# Patient Record
Sex: Male | Born: 1935
Health system: Southern US, Community
[De-identification: ages and names within clinical notes are randomized; demographics above are authoritative.]

## PROBLEM LIST (undated history)

## (undated) DIAGNOSIS — G8929 Other chronic pain: Secondary | ICD-10-CM

## (undated) DIAGNOSIS — I4510 Unspecified right bundle-branch block: Secondary | ICD-10-CM

## (undated) DIAGNOSIS — I5020 Unspecified systolic (congestive) heart failure: Secondary | ICD-10-CM

## (undated) DIAGNOSIS — E119 Type 2 diabetes mellitus without complications: Secondary | ICD-10-CM

## (undated) DIAGNOSIS — I350 Nonrheumatic aortic (valve) stenosis: Secondary | ICD-10-CM

## (undated) DIAGNOSIS — M1990 Unspecified osteoarthritis, unspecified site: Secondary | ICD-10-CM

## (undated) DIAGNOSIS — N1832 Chronic kidney disease, stage 3b: Secondary | ICD-10-CM

## (undated) DIAGNOSIS — E785 Hyperlipidemia, unspecified: Secondary | ICD-10-CM

## (undated) DIAGNOSIS — M5440 Lumbago with sciatica, unspecified side: Secondary | ICD-10-CM

## (undated) DIAGNOSIS — Z951 Presence of aortocoronary bypass graft: Secondary | ICD-10-CM

## (undated) DIAGNOSIS — Z973 Presence of spectacles and contact lenses: Secondary | ICD-10-CM

## (undated) DIAGNOSIS — N529 Male erectile dysfunction, unspecified: Secondary | ICD-10-CM

## (undated) DIAGNOSIS — Z87442 Personal history of urinary calculi: Secondary | ICD-10-CM

## (undated) DIAGNOSIS — N401 Enlarged prostate with lower urinary tract symptoms: Secondary | ICD-10-CM

## (undated) DIAGNOSIS — I2510 Atherosclerotic heart disease of native coronary artery without angina pectoris: Secondary | ICD-10-CM

## (undated) DIAGNOSIS — K439 Ventral hernia without obstruction or gangrene: Secondary | ICD-10-CM

## (undated) DIAGNOSIS — K5730 Diverticulosis of large intestine without perforation or abscess without bleeding: Secondary | ICD-10-CM

## (undated) DIAGNOSIS — I739 Peripheral vascular disease, unspecified: Secondary | ICD-10-CM

## (undated) DIAGNOSIS — K08109 Complete loss of teeth, unspecified cause, unspecified class: Secondary | ICD-10-CM

## (undated) DIAGNOSIS — E291 Testicular hypofunction: Secondary | ICD-10-CM

## (undated) DIAGNOSIS — I5022 Chronic systolic (congestive) heart failure: Secondary | ICD-10-CM

## (undated) DIAGNOSIS — I10 Essential (primary) hypertension: Secondary | ICD-10-CM

## (undated) DIAGNOSIS — R011 Cardiac murmur, unspecified: Secondary | ICD-10-CM

## (undated) DIAGNOSIS — Z972 Presence of dental prosthetic device (complete) (partial): Secondary | ICD-10-CM

## (undated) DIAGNOSIS — Z9289 Personal history of other medical treatment: Secondary | ICD-10-CM

## (undated) DIAGNOSIS — C679 Malignant neoplasm of bladder, unspecified: Secondary | ICD-10-CM

## (undated) DIAGNOSIS — N183 Chronic kidney disease, stage 3 unspecified: Secondary | ICD-10-CM

## (undated) HISTORY — PX: CORONARY ARTERY BYPASS GRAFT: SHX141

## (undated) HISTORY — DX: Unspecified osteoarthritis, unspecified site: M19.90

## (undated) HISTORY — DX: Atherosclerotic heart disease of native coronary artery without angina pectoris: I25.10

## (undated) HISTORY — DX: Ventral hernia without obstruction or gangrene: K43.9

## (undated) HISTORY — DX: Essential (primary) hypertension: I10

## (undated) HISTORY — DX: Type 2 diabetes mellitus without complications: E11.9

## (undated) HISTORY — PX: TONSILLECTOMY: SUR1361

## (undated) HISTORY — DX: Male erectile dysfunction, unspecified: N52.9

---

## 1998-05-09 ENCOUNTER — Encounter: Payer: Self-pay | Admitting: Endocrinology

## 1998-05-09 ENCOUNTER — Ambulatory Visit (HOSPITAL_COMMUNITY): Admission: RE | Admit: 1998-05-09 | Discharge: 1998-05-09 | Payer: Self-pay | Admitting: Endocrinology

## 2001-09-12 ENCOUNTER — Emergency Department (HOSPITAL_COMMUNITY): Admission: EM | Admit: 2001-09-12 | Discharge: 2001-09-12 | Payer: Self-pay | Admitting: Emergency Medicine

## 2003-12-02 ENCOUNTER — Ambulatory Visit: Payer: Self-pay | Admitting: Internal Medicine

## 2004-02-12 ENCOUNTER — Ambulatory Visit: Payer: Self-pay | Admitting: Internal Medicine

## 2004-03-31 ENCOUNTER — Ambulatory Visit: Payer: Self-pay | Admitting: Internal Medicine

## 2004-04-13 ENCOUNTER — Ambulatory Visit: Payer: Self-pay

## 2004-07-10 ENCOUNTER — Ambulatory Visit: Payer: Self-pay | Admitting: Internal Medicine

## 2004-10-12 ENCOUNTER — Ambulatory Visit: Payer: Self-pay | Admitting: Internal Medicine

## 2004-10-16 ENCOUNTER — Ambulatory Visit: Payer: Self-pay | Admitting: Internal Medicine

## 2005-04-02 ENCOUNTER — Ambulatory Visit: Payer: Self-pay | Admitting: Internal Medicine

## 2005-05-06 ENCOUNTER — Ambulatory Visit: Payer: Self-pay | Admitting: Internal Medicine

## 2005-05-07 ENCOUNTER — Ambulatory Visit: Payer: Self-pay | Admitting: Internal Medicine

## 2005-07-02 ENCOUNTER — Ambulatory Visit: Payer: Self-pay | Admitting: Internal Medicine

## 2005-10-28 ENCOUNTER — Ambulatory Visit: Payer: Self-pay | Admitting: Internal Medicine

## 2005-10-28 LAB — CONVERTED CEMR LAB
ALT: 20 units/L (ref 0–40)
AST: 22 units/L (ref 0–37)
Albumin: 4.1 g/dL (ref 3.5–5.2)
Alkaline Phosphatase: 71 units/L (ref 39–117)
BUN: 17 mg/dL (ref 6–23)
CO2: 26 meq/L (ref 19–32)
Calcium: 9 mg/dL (ref 8.4–10.5)
Chloride: 108 meq/L (ref 96–112)
Creatinine, Ser: 1.1 mg/dL (ref 0.4–1.5)
GFR calc non Af Amer: 71 mL/min
Glomerular Filtration Rate, Af Am: 86 mL/min/{1.73_m2}
Glucose, Bld: 139 mg/dL — ABNORMAL HIGH (ref 70–99)
HCT: 44.8 % (ref 39.0–52.0)
Hemoglobin: 15.1 g/dL (ref 13.0–17.0)
Hgb A1c MFr Bld: 5.9 % (ref 4.6–6.0)
MCHC: 33.8 g/dL (ref 30.0–36.0)
MCV: 87.2 fL (ref 78.0–100.0)
Platelets: 218 10*3/uL (ref 150–400)
Potassium: 4.2 meq/L (ref 3.5–5.1)
RBC: 5.14 M/uL (ref 4.22–5.81)
RDW: 12.2 % (ref 11.5–14.6)
Sodium: 140 meq/L (ref 135–145)
TSH: 1.06 microintl units/mL (ref 0.35–5.50)
Total Bilirubin: 1 mg/dL (ref 0.3–1.2)
Total Protein: 6.6 g/dL (ref 6.0–8.3)
WBC: 7 10*3/uL (ref 4.5–10.5)

## 2005-11-05 ENCOUNTER — Ambulatory Visit: Payer: Self-pay | Admitting: Internal Medicine

## 2006-03-03 ENCOUNTER — Ambulatory Visit: Payer: Self-pay | Admitting: Internal Medicine

## 2006-03-03 LAB — CONVERTED CEMR LAB
ALT: 25 units/L (ref 0–40)
AST: 23 units/L (ref 0–37)
BUN: 17 mg/dL (ref 6–23)
Cholesterol: 150 mg/dL (ref 0–200)
Creatinine, Ser: 1 mg/dL (ref 0.4–1.5)
HDL: 32.4 mg/dL — ABNORMAL LOW (ref >39.0)
Hgb A1c MFr Bld: 6.4 % — ABNORMAL HIGH (ref 4.6–6.0)
LDL Cholesterol: 89 mg/dL (ref 0–99)
Potassium: 4.3 meq/L (ref 3.5–5.1)
Sodium: 141 meq/L (ref 135–145)
Total CHOL/HDL Ratio: 4.6
Triglycerides: 145 mg/dL (ref 0–149)
VLDL: 29 mg/dL (ref 0–40)

## 2006-04-29 ENCOUNTER — Ambulatory Visit: Payer: Self-pay | Admitting: Internal Medicine

## 2006-07-26 DIAGNOSIS — E785 Hyperlipidemia, unspecified: Secondary | ICD-10-CM | POA: Insufficient documentation

## 2006-07-26 DIAGNOSIS — I10 Essential (primary) hypertension: Secondary | ICD-10-CM | POA: Insufficient documentation

## 2006-07-26 DIAGNOSIS — I2510 Atherosclerotic heart disease of native coronary artery without angina pectoris: Secondary | ICD-10-CM | POA: Insufficient documentation

## 2006-07-26 DIAGNOSIS — M1990 Unspecified osteoarthritis, unspecified site: Secondary | ICD-10-CM | POA: Insufficient documentation

## 2006-09-30 DIAGNOSIS — N259 Disorder resulting from impaired renal tubular function, unspecified: Secondary | ICD-10-CM | POA: Insufficient documentation

## 2006-09-30 DIAGNOSIS — M545 Low back pain, unspecified: Secondary | ICD-10-CM | POA: Insufficient documentation

## 2006-11-07 ENCOUNTER — Encounter: Payer: Self-pay | Admitting: Internal Medicine

## 2006-12-07 ENCOUNTER — Ambulatory Visit: Payer: Self-pay | Admitting: Internal Medicine

## 2006-12-07 LAB — CONVERTED CEMR LAB: Vit D, 1,25-Dihydroxy: 40 (ref 30–89)

## 2006-12-15 LAB — CONVERTED CEMR LAB
ALT: 29 units/L (ref 0–53)
AST: 23 units/L (ref 0–37)
Albumin: 4.1 g/dL (ref 3.5–5.2)
Alkaline Phosphatase: 81 units/L (ref 39–117)
BUN: 17 mg/dL (ref 6–23)
Basophils Absolute: 0 10*3/uL (ref 0.0–0.1)
Basophils Relative: 0.5 % (ref 0.0–1.0)
Bilirubin, Direct: 0.1 mg/dL (ref 0.0–0.3)
CO2: 26 meq/L (ref 19–32)
Calcium: 8.8 mg/dL (ref 8.4–10.5)
Chloride: 105 meq/L (ref 96–112)
Cholesterol: 177 mg/dL (ref 0–200)
Creatinine, Ser: 1 mg/dL (ref 0.4–1.5)
Eosinophils Absolute: 0.2 10*3/uL (ref 0.0–0.6)
Eosinophils Relative: 3.6 % (ref 0.0–5.0)
GFR calc Af Amer: 95 mL/min
GFR calc non Af Amer: 79 mL/min
Glucose, Bld: 155 mg/dL — ABNORMAL HIGH (ref 70–99)
HCT: 44.8 % (ref 39.0–52.0)
HDL: 31.5 mg/dL — ABNORMAL LOW (ref >39.0)
Hemoglobin: 15.8 g/dL (ref 13.0–17.0)
LDL Cholesterol: 112 mg/dL — ABNORMAL HIGH (ref 0–99)
Lymphocytes Relative: 24.1 % (ref 12.0–46.0)
MCHC: 35.3 g/dL (ref 30.0–36.0)
MCV: 85.8 fL (ref 78.0–100.0)
Monocytes Absolute: 0.9 10*3/uL — ABNORMAL HIGH (ref 0.2–0.7)
Monocytes Relative: 13.7 % — ABNORMAL HIGH (ref 3.0–11.0)
Neutro Abs: 4.1 10*3/uL (ref 1.4–7.7)
Neutrophils Relative %: 58.1 % (ref 43.0–77.0)
PSA: 0.86 ng/mL (ref 0.10–4.00)
Platelets: 190 10*3/uL (ref 150–400)
Potassium: 4.3 meq/L (ref 3.5–5.1)
RBC: 5.22 M/uL (ref 4.22–5.81)
RDW: 11.9 % (ref 11.5–14.6)
Sodium: 138 meq/L (ref 135–145)
TSH: 1.98 microintl units/mL (ref 0.35–5.50)
Total Bilirubin: 1 mg/dL (ref 0.3–1.2)
Total CHOL/HDL Ratio: 5.6
Total Protein: 6.7 g/dL (ref 6.0–8.3)
Triglycerides: 167 mg/dL — ABNORMAL HIGH (ref 0–149)
VLDL: 33 mg/dL (ref 0–40)
WBC: 6.9 10*3/uL (ref 4.5–10.5)

## 2007-03-03 ENCOUNTER — Ambulatory Visit: Payer: Self-pay | Admitting: Internal Medicine

## 2007-03-04 LAB — CONVERTED CEMR LAB
ALT: 21 units/L (ref 0–53)
AST: 18 units/L (ref 0–37)
Albumin: 4.2 g/dL (ref 3.5–5.2)
Alkaline Phosphatase: 81 units/L (ref 39–117)
BUN: 16 mg/dL (ref 6–23)
Bilirubin, Direct: 0.2 mg/dL (ref 0.0–0.3)
CO2: 31 meq/L (ref 19–32)
Calcium: 9.5 mg/dL (ref 8.4–10.5)
Chloride: 106 meq/L (ref 96–112)
Cholesterol: 161 mg/dL (ref 0–200)
Creatinine, Ser: 1 mg/dL (ref 0.4–1.5)
GFR calc Af Amer: 95 mL/min
GFR calc non Af Amer: 79 mL/min
Glucose, Bld: 153 mg/dL — ABNORMAL HIGH (ref 70–99)
HDL: 28.9 mg/dL — ABNORMAL LOW (ref >39.0)
Hgb A1c MFr Bld: 6.5 % — ABNORMAL HIGH (ref 4.6–6.0)
LDL Cholesterol: 95 mg/dL (ref 0–99)
Potassium: 5.1 meq/L (ref 3.5–5.1)
Sodium: 141 meq/L (ref 135–145)
Total Bilirubin: 1 mg/dL (ref 0.3–1.2)
Total CHOL/HDL Ratio: 5.6
Total Protein: 6.6 g/dL (ref 6.0–8.3)
Triglycerides: 184 mg/dL — ABNORMAL HIGH (ref 0–149)
VLDL: 37 mg/dL (ref 0–40)

## 2007-03-17 ENCOUNTER — Ambulatory Visit: Payer: Self-pay | Admitting: Internal Medicine

## 2007-07-17 ENCOUNTER — Ambulatory Visit: Payer: Self-pay | Admitting: Internal Medicine

## 2007-07-17 LAB — CONVERTED CEMR LAB
ALT: 19 units/L (ref 0–53)
AST: 22 units/L (ref 0–37)
Albumin: 3.9 g/dL (ref 3.5–5.2)
Alkaline Phosphatase: 77 units/L (ref 39–117)
BUN: 17 mg/dL (ref 6–23)
Basophils Absolute: 0 10*3/uL (ref 0.0–0.1)
Basophils Relative: 0.7 % (ref 0.0–1.0)
Bilirubin, Direct: 0.1 mg/dL (ref 0.0–0.3)
CO2: 26 meq/L (ref 19–32)
Calcium: 9.1 mg/dL (ref 8.4–10.5)
Chloride: 107 meq/L (ref 96–112)
Creatinine, Ser: 1 mg/dL (ref 0.4–1.5)
Eosinophils Absolute: 0.2 10*3/uL (ref 0.0–0.7)
Eosinophils Relative: 2.7 % (ref 0.0–5.0)
GFR calc Af Amer: 95 mL/min
GFR calc non Af Amer: 79 mL/min
Glucose, Bld: 163 mg/dL — ABNORMAL HIGH (ref 70–99)
HCT: 37.7 % — ABNORMAL LOW (ref 39.0–52.0)
Hemoglobin: 13.6 g/dL (ref 13.0–17.0)
Hgb A1c MFr Bld: 6.8 % — ABNORMAL HIGH (ref 4.6–6.0)
Lymphocytes Relative: 19.7 % (ref 12.0–46.0)
MCHC: 36 g/dL (ref 30.0–36.0)
MCV: 87.3 fL (ref 78.0–100.0)
Monocytes Absolute: 0.6 10*3/uL (ref 0.1–1.0)
Monocytes Relative: 8.4 % (ref 3.0–12.0)
Neutro Abs: 4.7 10*3/uL (ref 1.4–7.7)
Neutrophils Relative %: 68.5 % (ref 43.0–77.0)
Platelets: 120 10*3/uL — ABNORMAL LOW (ref 150–400)
Potassium: 4.4 meq/L (ref 3.5–5.1)
RBC: 4.32 M/uL (ref 4.22–5.81)
RDW: 11.9 % (ref 11.5–14.6)
Sodium: 146 meq/L — ABNORMAL HIGH (ref 135–145)
Total Bilirubin: 0.8 mg/dL (ref 0.3–1.2)
Total Protein: 6.5 g/dL (ref 6.0–8.3)
WBC: 6.8 10*3/uL (ref 4.5–10.5)

## 2007-07-20 ENCOUNTER — Ambulatory Visit: Payer: Self-pay | Admitting: Internal Medicine

## 2007-12-26 ENCOUNTER — Ambulatory Visit: Payer: Self-pay | Admitting: Internal Medicine

## 2007-12-26 LAB — CONVERTED CEMR LAB
BUN: 17 mg/dL (ref 6–23)
Bacteria, UA: NEGATIVE
Bilirubin Urine: NEGATIVE
CO2: 28 meq/L (ref 19–32)
Calcium: 9.2 mg/dL (ref 8.4–10.5)
Chloride: 107 meq/L (ref 96–112)
Cholesterol: 134 mg/dL (ref 0–200)
Creatinine, Ser: 0.9 mg/dL (ref 0.4–1.5)
Crystals: NEGATIVE
GFR calc Af Amer: 107 mL/min
GFR calc non Af Amer: 89 mL/min
Glucose, Bld: 166 mg/dL — ABNORMAL HIGH (ref 70–99)
HDL: 28.6 mg/dL — ABNORMAL LOW (ref >39.0)
Hemoglobin, Urine: NEGATIVE
Hgb A1c MFr Bld: 6.7 % — ABNORMAL HIGH (ref 4.6–6.0)
Ketones, ur: NEGATIVE mg/dL
LDL Cholesterol: 86 mg/dL (ref 0–99)
Leukocytes, UA: NEGATIVE
Nitrite: NEGATIVE
PSA: 0.83 ng/mL (ref 0.10–4.00)
Potassium: 4.5 meq/L (ref 3.5–5.1)
RBC / HPF: NONE SEEN
Sodium: 141 meq/L (ref 135–145)
Specific Gravity, Urine: 1.025 (ref 1.000–1.03)
Squamous Epithelial / HPF: NEGATIVE /lpf
TSH: 1.71 microintl units/mL (ref 0.35–5.50)
Total CHOL/HDL Ratio: 4.7
Total Protein, Urine: 30 mg/dL — AB
Triglycerides: 97 mg/dL (ref 0–149)
Urine Glucose: NEGATIVE mg/dL
Urobilinogen, UA: 0.2 (ref 0.0–1.0)
VLDL: 19 mg/dL (ref 0–40)
pH: 5.5 (ref 5.0–8.0)

## 2007-12-28 ENCOUNTER — Ambulatory Visit: Payer: Self-pay | Admitting: Internal Medicine

## 2007-12-28 DIAGNOSIS — L570 Actinic keratosis: Secondary | ICD-10-CM | POA: Insufficient documentation

## 2007-12-28 DIAGNOSIS — L219 Seborrheic dermatitis, unspecified: Secondary | ICD-10-CM | POA: Insufficient documentation

## 2008-04-25 ENCOUNTER — Ambulatory Visit: Payer: Self-pay | Admitting: Internal Medicine

## 2008-04-25 DIAGNOSIS — T476X5A Adverse effect of antidiarrheal drugs, initial encounter: Secondary | ICD-10-CM | POA: Insufficient documentation

## 2008-04-25 DIAGNOSIS — T887XXA Unspecified adverse effect of drug or medicament, initial encounter: Secondary | ICD-10-CM

## 2008-04-25 LAB — CONVERTED CEMR LAB
ALT: 21 units/L (ref 0–53)
AST: 19 units/L (ref 0–37)
Albumin: 4.3 g/dL (ref 3.5–5.2)
Alkaline Phosphatase: 77 units/L (ref 39–117)
BUN: 17 mg/dL (ref 6–23)
Bilirubin, Direct: 0.1 mg/dL (ref 0.0–0.3)
CO2: 32 meq/L (ref 19–32)
Calcium: 9.3 mg/dL (ref 8.4–10.5)
Chloride: 102 meq/L (ref 96–112)
Cholesterol: 124 mg/dL (ref 0–200)
Creatinine, Ser: 1.1 mg/dL (ref 0.4–1.5)
GFR calc non Af Amer: 70.11 mL/min (ref >60)
Glucose, Bld: 135 mg/dL — ABNORMAL HIGH (ref 70–99)
HDL: 33.6 mg/dL — ABNORMAL LOW (ref >39.00)
Hgb A1c MFr Bld: 6.7 % — ABNORMAL HIGH (ref 4.6–6.5)
LDL Cholesterol: 68 mg/dL (ref 0–99)
Potassium: 5 meq/L (ref 3.5–5.1)
Sodium: 142 meq/L (ref 135–145)
Total Bilirubin: 1.1 mg/dL (ref 0.3–1.2)
Total CHOL/HDL Ratio: 4
Total Protein: 6.7 g/dL (ref 6.0–8.3)
Triglycerides: 111 mg/dL (ref 0.0–149.0)
VLDL: 22.2 mg/dL (ref 0.0–40.0)

## 2008-04-29 ENCOUNTER — Ambulatory Visit: Payer: Self-pay | Admitting: Internal Medicine

## 2008-08-29 ENCOUNTER — Ambulatory Visit: Payer: Self-pay | Admitting: Internal Medicine

## 2008-08-29 LAB — CONVERTED CEMR LAB
BUN: 17 mg/dL (ref 6–23)
CO2: 31 meq/L (ref 19–32)
Calcium: 9.3 mg/dL (ref 8.4–10.5)
Chloride: 102 meq/L (ref 96–112)
Creatinine, Ser: 1.2 mg/dL (ref 0.4–1.5)
GFR calc non Af Amer: 63.35 mL/min (ref >60)
Glucose, Bld: 136 mg/dL — ABNORMAL HIGH (ref 70–99)
Hgb A1c MFr Bld: 6.2 % (ref 4.6–6.5)
Potassium: 4.4 meq/L (ref 3.5–5.1)
Sodium: 139 meq/L (ref 135–145)

## 2008-09-04 ENCOUNTER — Ambulatory Visit: Payer: Self-pay | Admitting: Internal Medicine

## 2008-10-14 ENCOUNTER — Telehealth: Payer: Self-pay | Admitting: Internal Medicine

## 2008-12-25 ENCOUNTER — Ambulatory Visit: Payer: Self-pay | Admitting: Internal Medicine

## 2008-12-27 LAB — CONVERTED CEMR LAB
ALT: 25 units/L (ref 0–53)
AST: 27 units/L (ref 0–37)
Albumin: 4 g/dL (ref 3.5–5.2)
Alkaline Phosphatase: 71 units/L (ref 39–117)
BUN: 18 mg/dL (ref 6–23)
Bilirubin, Direct: 0.2 mg/dL (ref 0.0–0.3)
CO2: 30 meq/L (ref 19–32)
Calcium: 9.4 mg/dL (ref 8.4–10.5)
Chloride: 102 meq/L (ref 96–112)
Creatinine, Ser: 1.3 mg/dL (ref 0.4–1.5)
GFR calc non Af Amer: 57.71 mL/min (ref >60)
Glucose, Bld: 153 mg/dL — ABNORMAL HIGH (ref 70–99)
Hgb A1c MFr Bld: 6.7 % — ABNORMAL HIGH (ref 4.6–6.5)
Potassium: 4.7 meq/L (ref 3.5–5.1)
Sodium: 140 meq/L (ref 135–145)
Total Bilirubin: 1.2 mg/dL (ref 0.3–1.2)
Total Protein: 6.9 g/dL (ref 6.0–8.3)

## 2009-01-16 ENCOUNTER — Ambulatory Visit: Payer: Self-pay | Admitting: Internal Medicine

## 2009-03-11 ENCOUNTER — Ambulatory Visit: Payer: Self-pay | Admitting: Internal Medicine

## 2009-06-11 ENCOUNTER — Ambulatory Visit: Payer: Self-pay | Admitting: Internal Medicine

## 2009-06-12 LAB — CONVERTED CEMR LAB
ALT: 21 units/L (ref 0–53)
AST: 22 units/L (ref 0–37)
Albumin: 4.2 g/dL (ref 3.5–5.2)
BUN: 21 mg/dL (ref 6–23)
Chloride: 103 meq/L (ref 96–112)
Glucose, Bld: 176 mg/dL — ABNORMAL HIGH (ref 70–99)
Hgb A1c MFr Bld: 7.1 % — ABNORMAL HIGH (ref 4.6–6.5)
Potassium: 4.6 meq/L (ref 3.5–5.1)

## 2009-06-13 ENCOUNTER — Ambulatory Visit: Payer: Self-pay | Admitting: Internal Medicine

## 2009-10-09 ENCOUNTER — Ambulatory Visit: Payer: Self-pay | Admitting: Internal Medicine

## 2009-10-09 LAB — CONVERTED CEMR LAB
ALT: 27 units/L (ref 0–53)
AST: 24 units/L (ref 0–37)
Albumin: 4 g/dL (ref 3.5–5.2)
Alkaline Phosphatase: 66 units/L (ref 39–117)
Basophils Absolute: 0.1 10*3/uL (ref 0.0–0.1)
Basophils Relative: 1 % (ref 0.0–3.0)
Bilirubin Urine: NEGATIVE
Eosinophils Relative: 2.2 % (ref 0.0–5.0)
GFR calc non Af Amer: 63.16 mL/min (ref >60)
Glucose, Bld: 138 mg/dL — ABNORMAL HIGH (ref 70–99)
HCT: 40.3 % (ref 39.0–52.0)
Hemoglobin, Urine: NEGATIVE
Hemoglobin: 14.2 g/dL (ref 13.0–17.0)
Ketones, ur: NEGATIVE mg/dL
Lymphs Abs: 1.7 10*3/uL (ref 0.7–4.0)
Monocytes Relative: 10.3 % (ref 3.0–12.0)
Neutro Abs: 5.2 10*3/uL (ref 1.4–7.7)
Potassium: 5 meq/L (ref 3.5–5.1)
RBC: 4.55 M/uL (ref 4.22–5.81)
RDW: 12.5 % (ref 11.5–14.6)
Sodium: 139 meq/L (ref 135–145)
TSH: 1.62 microintl units/mL (ref 0.35–5.50)
Total CHOL/HDL Ratio: 4
Total Protein, Urine: NEGATIVE mg/dL
Total Protein: 6.7 g/dL (ref 6.0–8.3)
Urine Glucose: NEGATIVE mg/dL
Urobilinogen, UA: 0.2 (ref 0.0–1.0)
VLDL: 35 mg/dL (ref 0.0–40.0)

## 2009-10-14 ENCOUNTER — Ambulatory Visit: Payer: Self-pay | Admitting: Internal Medicine

## 2009-10-14 ENCOUNTER — Encounter: Payer: Self-pay | Admitting: Internal Medicine

## 2009-10-14 DIAGNOSIS — D485 Neoplasm of uncertain behavior of skin: Secondary | ICD-10-CM

## 2010-01-20 ENCOUNTER — Ambulatory Visit: Admit: 2010-01-20 | Payer: Self-pay | Admitting: Internal Medicine

## 2010-02-10 NOTE — Assessment & Plan Note (Signed)
Summary: 1 MTH FU  STC / # / RS'D PER PT/NWS   Vital Signs:  Patient profile:   75 year old male Weight:      232 pounds BMI:     34.38 Temp:     97.8 degrees F oral Pulse rate:   91 / minute BP sitting:   160 / 80  (left arm)  Vitals Entered By: Tora Perches (March 11, 2009 7:58 AM) CC: f/u Is Patient Diabetic? Yes CBG Result 266   Primary Care Provider:  Tresa Garter MD  CC:  f/u.  History of Present Illness: The patient presents for a follow up of hypertension, CAD, DM, hyperlipidemia   Preventive Screening-Counseling & Management  Alcohol-Tobacco     Smoking Status: quit  Current Medications (verified): 1)  Mevacor 40 Mg  Tabs (Lovastatin) .... Once Daily 2)  Metformin Hcl 500 Mg  Tb24 (Metformin Hcl) .[name].. 1 By Mouth Bid 3)  Naproxen 500 Mg Tabs (Naproxen) .[name].. 1 By Mouth Two Times A Day Pc As Needed Pain 4)  Vitamin D3 1000 Unit  Tabs (Cholecalciferol) .[name].. 1 Qd 5)  Adult Aspirin Low Strength 81 Mg  Tbdp (Aspirin) .... Once Daily 6)  Lotrisone 1-0.05 % Crea (Clotrimazole-Betamethasone) .... Use Two Times A Day 7)  Triamcinolone Acetonide 0.1 % Oint (Triamcinolone Acetonide) .... Use 1-2 Times A Day 8)  Exforge Hct 10-320-25 Mg Tabs (Amlodipine-Valsartan-Hctz) .[name].. 1 By Mouth Once Daily For Blood Pressure  Allergies: 1)  ! Morphine Sulfate (Morphine Sulfate)  Past History:  Past Medical History: Last updated: 09/04/2008 Coronary artery disease Hyperlipidemia Hypertension Osteoarthritis Low back pain Renal insufficiency Diabetes mellitus, type II ED [place] Midline ventral hernia  Social History: Last updated: 12/07/2006 Married Retired, but working full time Former Smoker Regular exercise-yes  Family History: Reviewed history from 07/26/2006 and no changes required. Family History Hypertension  Social History: Reviewed history from 12/07/2006 and no changes required. Married Retired, but working full time Former Smoker Regular  exercise-yes  Review of Systems       The patient complains of weight gain.  The patient denies dyspnea on exertion and abdominal pain.    Physical Exam  General:  pleasant and cooperative, in no distress overweight-appearing.   Nose:  no erythema, no lesions Mouth:  no lesions Lungs:  Normal respiratory effort, chest expands symmetrically. Lungs are clear to auscultation, no crackles or wheezes. Heart:  Normal rate and regular rhythm. S1 and S2 normal without gallop, click, rub or other extra sounds. 2/6 systolic heart murmur  Abdomen:  Bowel sounds positive,abdomen soft and non-tender without masses, organomegaly. A large central ventral hernia noted.  Msk:  No deformity or scoliosis noted of thoracic or lumbar spine.   Extremities:  No clubbing, cyanosis, edema, or deformity noted with normal full range of motion of all joints.   Neurologic:  No cranial nerve deficits noted. Station and gait are normal. Plantar reflexes are down-going bilaterally. DTRs are symmetrical throughout. Sensory, motor and coordinative functions appear intact. Skin:  Intact without suspicious lesions or rashes Psych:  Cognition and judgment appear intact. Alert and cooperative with normal attention span and concentration. No apparent delusions, illusions, hallucinations   Impression & Recommendations:  Problem # 1:  DIABETES MELLITUS, TYPE II (ICD-250.00) Assessment Deteriorated Stop protein drinks The following medications were removed from the medication list:    Metformin Hcl 500 Mg Tb24 (Metformin hcl) .[name]... 1 by mouth bid His updated medication list for this problem includes:  Adult Aspirin Low Strength 81 Mg Tbdp (Aspirin) ..... Once daily    Exforge Hct 10-320-25 Mg Tabs (Amlodipine-valsartan-hctz) .[name]... 1 by mouth once daily for blood pressure    Metformin Hcl 1000 Mg Tabs (Metformin hcl) .[name]... 1 by mouth bid  Problem # 2:  LOW BACK PAIN (ICD-724.2) Assessment: Improved  His updated  medication list for this problem includes:    Naproxen 500 Mg Tabs (Naproxen) .[name]... 1 by mouth two times a day pc as needed pain    Adult Aspirin Low Strength 81 Mg Tbdp (Aspirin) ..... Once daily  Problem # 3:  HYPERTENSION (ICD-401.9) Assessment: Improved  His updated medication list for this problem includes:    Exforge Hct 10-320-25 Mg Tabs (Amlodipine-valsartan-hctz) .[name]... 1 by mouth once daily for blood pressure  Problem # 4:  RENAL INSUFFICIENCY (ICD-588.9) Assessment: Unchanged  Problem # 5:  CORONARY ARTERY DISEASE (ICD-414.00) Assessment: Unchanged  His updated medication list for this problem includes:    Adult Aspirin Low Strength 81 Mg Tbdp (Aspirin) ..... Once daily    Exforge Hct 10-320-25 Mg Tabs (Amlodipine-valsartan-hctz) .[name]... 1 by mouth once daily for blood pressure  Complete Medication List: 1)  Mevacor 40 Mg Tabs (Lovastatin) .... Once daily 2)  Naproxen 500 Mg Tabs (Naproxen) .[name].. 1 by mouth two times a day pc as needed pain 3)  Vitamin D3 1000 Unit Tabs (Cholecalciferol) .[name].. 1 qd 4)  Adult Aspirin Low Strength 81 Mg Tbdp (Aspirin) .... Once daily 5)  Lotrisone 1-0.05 % Crea (Clotrimazole-betamethasone) .... Use two times a day 6)  Triamcinolone Acetonide 0.1 % Oint (Triamcinolone acetonide) .... Use 1-2 times a day 7)  Exforge Hct 10-320-25 Mg Tabs (Amlodipine-valsartan-hctz) .[name].. 1 by mouth once daily for blood pressure 8)  Metformin Hcl 1000 Mg Tabs (Metformin hcl) .[name].. 1 by mouth bid  Patient Instructions: 1)  Use stretching exercises that I have provided (15 min. or longer every day) 2)  Loose weight! 3)  BMP prior to visit, ICD-9: 4)  Hepatic Panel prior to visit, ICD-9: 5)  HbgA1C prior to visit, ICD-9: 6)  Please schedule a follow-up appointment in 3 months. Prescriptions: METFORMIN HCL 1000 MG TABS (METFORMIN HCL) 1 by mouth bid  #60 x 12   Entered and Authorized by:   Tresa Garter MD   Signed by:   Tresa Garter MD on 03/11/2009    Method used:   Electronically to        [facility] [id number] (retail)       [address]       [place], [place]  [address]       Ph: [phone]       Fax: [phone]   RxID:   [id number]

## 2010-02-10 NOTE — Assessment & Plan Note (Signed)
Summary: 4 MO ROV /NWS $50    Vital Signs:  Patient profile:   75 year old male Weight:      227 pounds Temp:     97.5 degrees F oral Pulse rate:   68 / minute BP sitting:   180 / 76  (left arm)  Vitals Entered By: Tora Perches (January 16, 2009 10:06 AM) CC: f/u Is Patient Diabetic? Yes   Primary Care Provider:  Tresa Garter MD  CC:  f/u.  History of Present Illness: The patient presents for a follow up of hypertension, diabetes, hyperlipidemia   Preventive Screening-Counseling & Management  Alcohol-Tobacco     Smoking Status: quit  Current Medications (verified): 1)  Enalapril Maleate 20 Mg  Tabs (Enalapril Maleate) .[name].. 1 By Mouth Bid 2)  Mevacor 40 Mg  Tabs (Lovastatin) .... Once Daily 3)  Metformin Hcl 500 Mg  Tb24 (Metformin Hcl) .[name].. 1 By Mouth Bid 4)  Naproxen 500 Mg Tabs (Naproxen) .[name].. 1 By Mouth Two Times A Day Pc As Needed Pain 5)  Vitamin D3 1000 Unit  Tabs (Cholecalciferol) .[name].. 1 Qd 6)  Adult Aspirin Low Strength 81 Mg  Tbdp (Aspirin) .... Once Daily 7)  Verapamil Hcl Cr 360 Mg  Cp24 (Verapamil Hcl) .[name].. 1 By Mouth Daily 8)  Hydrochlorothiazide 12.5 Mg  Tabs (Hydrochlorothiazide) .... Take 1 Tab  By Mouth Every Morning 9)  Lotrisone 1-0.05 % Crea (Clotrimazole-Betamethasone) .... Use Two Times A Day 10)  Triamcinolone Acetonide 0.1 % Oint (Triamcinolone Acetonide) .... Use 1-2 Times A Day  Allergies: 1)  ! Morphine Sulfate (Morphine Sulfate)  Past History:  Past Medical History: Last updated: 09/04/2008 Coronary artery disease Hyperlipidemia Hypertension Osteoarthritis Low back pain Renal insufficiency Diabetes mellitus, type II ED [place] Midline ventral hernia  Social History: Last updated: 12/07/2006 Married Retired, but working full time Former Smoker Regular exercise-yes  Review of Systems       The patient complains of weight gain.  The patient denies chest pain, syncope, and dyspnea on exertion.    Physical  Exam  General:  pleasant and cooperative, in no distress overweight-appearing.   Nose:  no erythema, no lesions Mouth:  no lesions Neck:  supple, no lymphodenopathy Lungs:  Normal respiratory effort, chest expands symmetrically. Lungs are clear to auscultation, no crackles or wheezes. Heart:  Normal rate and regular rhythm. S1 and S2 normal without gallop, murmur, click, rub or other extra sounds. Abdomen:  Bowel sounds positive,abdomen soft and non-tender without masses, organomegaly. A large central ventral hernia noted.  Msk:  No deformity or scoliosis noted of thoracic or lumbar spine.   Neurologic:  No cranial nerve deficits noted. Station and gait are normal. Plantar reflexes are down-going bilaterally. DTRs are symmetrical throughout. Sensory, motor and coordinative functions appear intact.   Impression & Recommendations:  Problem # 1:  DIABETES MELLITUS, TYPE II (ICD-250.00) Assessment Comment Only  The following medications were removed from the medication list:    Enalapril Maleate 20 Mg Tabs (Enalapril maleate) .[name]... 1 by mouth bid His updated medication list for this problem includes:    Metformin Hcl 500 Mg Tb24 (Metformin hcl) .[name]... 1 by mouth bid    Adult Aspirin Low Strength 81 Mg Tbdp (Aspirin) ..... Once daily    Exforge Hct 10-320-25 Mg Tabs (Amlodipine-valsartan-hctz) .[name]... 1 by mouth once daily for blood pressure  Labs Reviewed: Creat: 1.3 (12/25/2008)    Reviewed HgBA1c results: 6.7 (12/25/2008)  6.2 (08/29/2008)  Problem # 2:  RENAL INSUFFICIENCY (ICD-588.9) Assessment: Unchanged  Problem # 3:  LOW BACK PAIN (ICD-724.2) Assessment: Improved  His updated medication list for this problem includes:    Naproxen 500 Mg Tabs (Naproxen) .[name]... 1 by mouth two times a day pc as needed pain    Adult Aspirin Low Strength 81 Mg Tbdp (Aspirin) ..... Once daily  Problem # 4:  HYPERTENSION (ICD-401.9) Assessment: Deteriorated  The following medications were removed  from the medication list:    Enalapril Maleate 20 Mg Tabs (Enalapril maleate) .[name]... 1 by mouth bid    Verapamil Hcl Cr 360 Mg Cp24 (Verapamil hcl) .[name]... 1 by mouth daily    Hydrochlorothiazide 12.5 Mg Tabs (Hydrochlorothiazide) .[name]... Take 1 tab  by mouth every morning His updated medication list for this problem includes:    Exforge Hct 10-320-25 Mg Tabs (Amlodipine-valsartan-hctz) .[name]... 1 by mouth once daily for blood pressure  Problem # 5:  CORONARY ARTERY DISEASE (ICD-414.00) Assessment: Unchanged  The following medications were removed from the medication list:    Enalapril Maleate 20 Mg Tabs (Enalapril maleate) .[name]... 1 by mouth bid    Verapamil Hcl Cr 360 Mg Cp24 (Verapamil hcl) .[name]... 1 by mouth daily    Hydrochlorothiazide 12.5 Mg Tabs (Hydrochlorothiazide) .[name]... Take 1 tab  by mouth every morning His updated medication list for this problem includes:    Adult Aspirin Low Strength 81 Mg Tbdp (Aspirin) ..... Once daily    Exforge Hct 10-320-25 Mg Tabs (Amlodipine-valsartan-hctz) .[name]... 1 by mouth once daily for blood pressure  Labs Reviewed: Chol: 124 (04/25/2008)   HDL: 33.60 (04/25/2008)   LDL: 68 (04/25/2008)   TG: 111.0 (04/25/2008)  Complete Medication List: 1)  Mevacor 40 Mg Tabs (Lovastatin) .... Once daily 2)  Metformin Hcl 500 Mg Tb24 (Metformin hcl) .[name].. 1 by mouth bid 3)  Naproxen 500 Mg Tabs (Naproxen) .[name].. 1 by mouth two times a day pc as needed pain 4)  Vitamin D3 1000 Unit Tabs (Cholecalciferol) .[name].. 1 qd 5)  Adult Aspirin Low Strength 81 Mg Tbdp (Aspirin) .... Once daily 6)  Lotrisone 1-0.05 % Crea (Clotrimazole-betamethasone) .... Use two times a day 7)  Triamcinolone Acetonide 0.1 % Oint (Triamcinolone acetonide) .... Use 1-2 times a day 8)  Exforge Hct 10-320-25 Mg Tabs (Amlodipine-valsartan-hctz) .[name].. 1 by mouth once daily for blood pressure  Contraindications/Deferment of Procedures/Staging:    Test/Procedure: FLU VAX    Reason for deferment: patient  declined   Patient Instructions: 1)  Please schedule a follow-up appointment in 1 month. 2)  You need to lose weight. Consider a lower calorie diet and regular exercise.  3)  Try to eat more raw plant food, fresh and dry fruit, raw almonds, leafy vegetables, whole foods and less red meat, less animal fat. Poultry and fish is better for you than pork and beef. Avoid processed foods (canned soups, hot dogs, sausage, bacon , frozen dinners). Avoid corn syrup, high fructose syrup or aspartam and Splenda  containing drinks. Honey, Agave and Stevia are better sweeteners. Make your own  dressing with olive oil, wine vinegar, lemon juce, garlic etc. for your salads.  Prescriptions: EXFORGE HCT 10-320-25 MG TABS (AMLODIPINE-VALSARTAN-HCTZ) 1 by mouth once daily for blood pressure  #90 x 3   Entered and Authorized by:   Tresa Garter MD   Signed by:   Tresa Garter MD on 01/16/2009   Method used:   Print then Give to Patient   RxID:   [id number]

## 2010-02-10 NOTE — Assessment & Plan Note (Signed)
Summary: 3 mos f/u // #. cd   Vital Signs:  Patient profile:   75 year old male Height:      69 inches Weight:      225.50 pounds BMI:     33.42 O2 Sat:      97 % on Room air Temp:     96.8 degrees F oral Pulse rate:   88 / minute BP sitting:   150 / 78  (left arm) Cuff size:   large  Vitals Entered By: Lucious Groves (June 13, 2009 9:56 AM)  O2 Flow:  Room air CC: 3 mo f/u./kb Is Patient Diabetic? Yes Pain Assessment Patient in pain? no        Primary Care Provider:  Georgina Quint Hatley Henegar MD  CC:  3 mo f/u./kb.  History of Present Illness: The patient presents for a follow up of hypertension, diabetes, hyperlipidemia   Current Medications (verified): 1)  Mevacor 40 Mg  Tabs (Lovastatin) .... Once Daily 2)  Naproxen 500 Mg Tabs (Naproxen) .[name].. 1 By Mouth Two Times A Day Pc As Needed Pain 3)  Vitamin D3 1000 Unit  Tabs (Cholecalciferol) .[name].. 1 Qd 4)  Adult Aspirin Low Strength 81 Mg  Tbdp (Aspirin) .... Once Daily 5)  Lotrisone 1-0.05 % Crea (Clotrimazole-Betamethasone) .... Use Two Times A Day 6)  Triamcinolone Acetonide 0.1 % Oint (Triamcinolone Acetonide) .... Use 1-2 Times A Day 7)  Exforge Hct 10-320-25 Mg Tabs (Amlodipine-Valsartan-Hctz) .[name].. 1 By Mouth Once Daily For Blood Pressure 8)  Metformin Hcl 1000 Mg Tabs (Metformin Hcl) .[name].. 1 By Mouth Bid  Allergies (verified): 1)  ! Morphine Sulfate (Morphine Sulfate)  Past History:  Past Medical History: Last updated: 09/04/2008 Coronary artery disease Hyperlipidemia Hypertension Osteoarthritis Low back pain Renal insufficiency Diabetes mellitus, type II ED [place] Midline ventral hernia  Social History: Last updated: 12/07/2006 Married Retired, but working full time Former Smoker Regular exercise-yes  Review of Systems  The patient denies fever, chest pain, and dyspnea on exertion.    Physical Exam  General:  pleasant and cooperative, in no distress overweight-appearing.   Nose:  no erythema, no  lesions Mouth:  no lesions Neck:  supple, no lymphodenopathy Lungs:  Normal respiratory effort, chest expands symmetrically. Lungs are clear to auscultation, no crackles or wheezes. Heart:  Normal rate and regular rhythm. S1 and S2 normal without gallop, click, rub or other extra sounds. 2/6 systolic heart murmur  Abdomen:  Bowel sounds positive,abdomen soft and non-tender without masses, organomegaly. A large central ventral hernia noted.  Msk:  No deformity or scoliosis noted of thoracic or lumbar spine.   Extremities:  No clubbing, cyanosis, or deformity noted with normal full range of motion of all joints.  trace left pedal edema and trace right pedal edema.   Neurologic:  No cranial nerve deficits noted. Station and gait are normal. Plantar reflexes are down-going bilaterally. DTRs are symmetrical throughout. Sensory, motor and coordinative functions appear intact. Skin:  Intact without suspicious lesions or rashes Psych:  Cognition and judgment appear intact. Alert and cooperative with normal attention span and concentration. No apparent delusions, illusions, hallucinations   Impression & Recommendations:  Problem # 1:  HYPERTENSION (ICD-401.9) Assessment Improved SBP 140 at home His updated medication list for this problem includes:    Exforge Hct 10-320-25 Mg Tabs (Amlodipine-valsartan-hctz) .[name]... 1 by mouth once daily for blood pressure  Problem # 2:  DIABETES MELLITUS, TYPE II (ICD-250.00) Assessment: Deteriorated  His updated medication list for this  problem includes:    Adult Aspirin Low Strength 81 Mg Tbdp (Aspirin) ..... Once daily    Exforge Hct 10-320-25 Mg Tabs (Amlodipine-valsartan-hctz) .[name]... 1 by mouth once daily for blood pressure    Metformin Hcl 1000 Mg Tabs (Metformin hcl) .[name]... 1 by mouth two times a day Risks of noncompliance with treatment discussed. Compliance encouraged.   Problem # 3:  RENAL INSUFFICIENCY (ICD-588.9) Assessment: Unchanged  Problem # 4:   LOW BACK PAIN (ICD-724.2) Assessment: Unchanged  His updated medication list for this problem includes:    Naproxen 500 Mg Tabs (Naproxen) .[name]... 1 by mouth two times a day pc as needed pain    Adult Aspirin Low Strength 81 Mg Tbdp (Aspirin) ..... Once daily  Complete Medication List: 1)  Mevacor 40 Mg Tabs (Lovastatin) .... Once daily 2)  Naproxen 500 Mg Tabs (Naproxen) .[name].. 1 by mouth two times a day pc as needed pain 3)  Vitamin D3 1000 Unit Tabs (Cholecalciferol) .[name].. 1 qd 4)  Adult Aspirin Low Strength 81 Mg Tbdp (Aspirin) .... Once daily 5)  Lotrisone 1-0.05 % Crea (Clotrimazole-betamethasone) .... Use two times a day 6)  Triamcinolone Acetonide 0.1 % Oint (Triamcinolone acetonide) .... Use 1-2 times a day 7)  Exforge Hct 10-320-25 Mg Tabs (Amlodipine-valsartan-hctz) .[name].. 1 by mouth once daily for blood pressure 8)  Metformin Hcl 1000 Mg Tabs (Metformin hcl) .[name].. 1 by mouth bid  Patient Instructions: 1)  Please schedule a follow-up appointment in 4 months well w/labs v70.0. 2)  BMP prior to visit, ICD-9: 3)  HbgA1C prior to visit, ICD-9:250.00

## 2010-02-10 NOTE — Assessment & Plan Note (Signed)
Summary: CPX / NWS  #   Vital Signs:  Patient profile:   75 year old male Height:      69 inches Weight:      225 pounds BMI:     33.35 Temp:     97.2 degrees F oral Pulse rate:   79 / minute BP sitting:   130 / 70  (left arm) Cuff size:   large  Vitals Entered By: Lanier Prude, CMA(AAMA) (October 14, 2009 10:45 AM)  Procedure Note  Biopsy: The patient complains of pain and irritation. Indication: inflamed lesion Consent signed: yes  Procedure # 1: shave removal    Size (in cm): 0.4 x 0.3    Region: lateral    Location: R chest    Comment: Risks including but not limited by incomplete procedure, bleeding, infection, recurrence were discussed with the patient. Consent form was signed. Tolerated well. Complicatons - none. Good pain relief following the procedure.     Instrument used: #10 blade    Anesthesia: 0.5 ml 1% lidocaine w/epinephrine  Cleaned and prepped with: alcohol and betadine Wound dressing: neosporin and bandaid Instructions: daily dressing changes Additional Instructions: I dscarded the removed lesion  CC: MWV Is Patient Diabetic? Yes   Primary Care Ohm Dentler:  Tresa Garter MD  CC:  MWV.  History of Present Illness: The patient presents for a preventive health examination  Patient past medical history, social history, and family history reviewed in detail no significant changes.  Patient is physically active. Depression is negative and mood is good. Hearing is almost normal, and able to perform activities of daily living. Risk of falling is negligible and home safety has been reviewed and is appropriate. Patient is overweight, and visual acuity is good w/glasses. Patient has been counseled on age-appropriate routine health concerns for screening and prevention. Education, counseling done.  C/o ankle swellig and fatigue from Exforge HCT C/o R chest mole  Preventive Screening-Counseling & Management  Alcohol-Tobacco     Alcohol drinks/day: <1    Smoking Status: quit > 6 months  Caffeine-Diet-Exercise     Caffeine Counseling: not indicated; caffeine use is not excessive or problematic     Diet Counseling: to improve diet; diet is suboptimal     Does Patient Exercise: yes     Depression Counseling: not indicated; screening negative for depression  Hep-HIV-STD-Contraception     Hepatitis Risk: no risk noted     Sun Exposure-Excessive: yes     Sun Exposure Counseling: to decrease sun exposure  Safety-Violence-Falls     Seat Belt Use: yes     Helmet Use: yes     Firearms in the Home: firearms in the home     Fall Risk Counseling: not indicated; no significant falls noted      Sexual History:  currently monogamous.    Current Medications (verified): 1)  Mevacor 40 Mg  Tabs (Lovastatin) .... Once Daily 2)  Naproxen 500 Mg Tabs (Naproxen) .[name].. 1 By Mouth Two Times A Day Pc As Needed Pain 3)  Vitamin D3 1000 Unit  Tabs (Cholecalciferol) .[name].. 1 Qd 4)  Adult Aspirin Low Strength 81 Mg  Tbdp (Aspirin) .... Once Daily 5)  Lotrisone 1-0.05 % Crea (Clotrimazole-Betamethasone) .... Use Two Times A Day 6)  Triamcinolone Acetonide 0.1 % Oint (Triamcinolone Acetonide) .... Use 1-2 Times A Day 7)  Exforge Hct 10-320-25 Mg Tabs (Amlodipine-Valsartan-Hctz) .[name].. 1 By Mouth Once Daily For Blood Pressure 8)  Metformin Hcl 1000 Mg Tabs (Metformin Hcl) .Marland KitchenMarland KitchenMarland Kitchen  1 By Mouth Bid 9)  Fish Oil 1000 Mg Caps (Omega-3 Fatty Acids) .... 2 Once Daily  Allergies (verified): 1)  ! Morphine Sulfate (Morphine Sulfate)  Past History:  Past Medical History: Last updated: 09/04/2008 Coronary artery disease Hyperlipidemia Hypertension Osteoarthritis Low back pain Renal insufficiency Diabetes mellitus, type II ED AK Midline ventral hernia  Past Surgical History: Last updated: 11/07/2006 Coronary artery bypass graft  98  Family History: Last updated: 07/26/2006 Family History Hypertension  Social History: Last updated:  12/07/2006 Married Retired, but working full time Former Smoker Regular exercise-yes  Social History: Smoking Status:  quit > 6 months Hepatitis Risk:  no risk noted Sun Exposure-Excessive:  yes Seat Belt Use:  yes Sexual History:  currently monogamous  Physical Exam  General:  pleasant and cooperative, in no distress overweight-appearing.   Head:  Normocephalic and atraumatic without obvious abnormalities. No apparent alopecia or balding. Eyes:  No corneal or conjunctival inflammation noted. EOMI. Perrla.  Ears:  External ear exam shows no significant lesions or deformities.  Otoscopic examination reveals clear canals, tympanic membranes are intact bilaterally without bulging, retraction, inflammation or discharge. Hearing is grossly normal bilaterally. Nose:  External nasal examination shows no deformity or inflammation. Nasal mucosa are pink and moist without lesions or exudates. Mouth:  Oral mucosa and oropharynx without lesions or exudates.  Teeth in good repair. Neck:  supple, no lymphodenopathy Chest Wall:  No deformities, masses, tenderness or gynecomastia noted. Lungs:  Normal respiratory effort, chest expands symmetrically. Lungs are clear to auscultation, no crackles or wheezes. Heart:  Normal rate and regular rhythm. S1 and S2 normal without gallop, click, rub or other extra sounds. 2/6 systolic heart murmur  Abdomen:  Bowel sounds positive,abdomen soft and non-tender without masses, organomegaly or hernias noted. Rectal:  No external abnormalities noted. Normal sphincter tone. No rectal masses or tenderness. Genitalia:  Testes bilaterally descended without nodularity, tenderness or masses. No scrotal masses or lesions. No penis lesions or urethral discharge. Prostate:  1+ enlarged.   Msk:  No deformity or scoliosis noted of thoracic or lumbar spine.   Pulses:  R and L carotid,radial,femoral,dorsalis pedis and posterior tibial pulses are full and equal  bilaterally Extremities:  trace left pedal edema and trace right pedal edema.   Neurologic:  No cranial nerve deficits noted. Station and gait are normal. Plantar reflexes are down-going bilaterally. DTRs are symmetrical throughout. Sensory, motor and coordinative functions appear intact. Skin:  Intact without  rashes R lat chest wall 4 mm growth on a stalk - inflamed Cervical Nodes:  No lymphadenopathy noted Inguinal Nodes:  No significant adenopathy Psych:  Cognition and judgment appear intact. Alert and cooperative with normal attention span and concentration. No apparent delusions, illusions, hallucinations   Impression & Recommendations:  Problem # 1:  ROUTINE GENERAL MEDICAL EXAM@HEALTH  CARE FACL (ICD-V70.0) Assessment New Overall doing well, age appropriate education and counseling updated and referral for appropriate preventive services done unless declined, immunizations up to date or declined, diet counseling done if overweight, urged to quit smoking if smokes, most recent labs reviewed and current ordered if appropriate, ecg reviewed or declined (interpretation per ECG scanned in the EMR if done); information regarding Medicare Preventation requirements given if appropriate.  The labs were reviewed with the patient.  Orders: EKG w/ Interpretation (93000) Medicare -1st Annual Wellness Visit [phone]  Problem # 2:  HYPERTENSION (ICD-401.9) Assessment: Comment Only  The following medications were removed from the medication list:    Exforge Hct 10-320-25 Mg Tabs (  Amlodipine-valsartan-hctz) .[name]... 1 by mouth once daily for blood pressure His updated medication list for this problem includes:    Tribenzor 40-5-25 Mg Tabs (Olmesartan-amlodipine-hctz) .[name]... 1 by mouth qd  BP today: 130/70 Prior BP: 150/78 (06/13/2009)  Labs Reviewed: K+: 5.0 (10/09/2009) Creat: : 1.2 (10/09/2009)   Chol: 132 (10/09/2009)   HDL: 33.90 (10/09/2009)   LDL: 63 (10/09/2009)   TG: 175.0  (10/09/2009)  Problem # 3:  DIABETES MELLITUS, TYPE II (ICD-250.00) Assessment: Unchanged  The following medications were removed from the medication list:    Exforge Hct 10-320-25 Mg Tabs (Amlodipine-valsartan-hctz) .[name]... 1 by mouth once daily for blood pressure His updated medication list for this problem includes:    Adult Aspirin Low Strength 81 Mg Tbdp (Aspirin) ..... Once daily    Metformin Hcl 1000 Mg Tabs (Metformin hcl) .[name]... 1 by mouth bid    Tribenzor 40-5-25 Mg Tabs (Olmesartan-amlodipine-hctz) .[name]... 1 by mouth qd  Labs Reviewed: Creat: 1.2 (10/09/2009)    Reviewed HgBA1c results: 7.2 (10/09/2009)  7.1 (06/11/2009)  Problem # 4:  HYPERLIPIDEMIA (ICD-272.4) Assessment: Unchanged  His updated medication list for this problem includes:    Mevacor 40 Mg Tabs (Lovastatin) ..... Once daily  Problem # 5:  CORONARY ARTERY DISEASE (ICD-414.00) Assessment: Unchanged  The following medications were removed from the medication list:    Exforge Hct 10-320-25 Mg Tabs (Amlodipine-valsartan-hctz) .[name]... 1 by mouth once daily for blood pressure His updated medication list for this problem includes:    Adult Aspirin Low Strength 81 Mg Tbdp (Aspirin) ..... Once daily    Tribenzor 40-5-25 Mg Tabs (Olmesartan-amlodipine-hctz) .[name]... 1 by mouth qd  Problem # 6:  NEOPLASM OF UNCERTAIN BEHAVIOR OF SKIN (ICD-238.2) inflamed polyp Assessment: Deteriorated He asked me to remove it  Complete Medication List: 1)  Mevacor 40 Mg Tabs (Lovastatin) .... Once daily 2)  Naproxen 500 Mg Tabs (Naproxen) .[name].. 1 by mouth two times a day pc as needed pain 3)  Vitamin D3 1000 Unit Tabs (Cholecalciferol) .[name].. 1 qd 4)  Adult Aspirin Low Strength 81 Mg Tbdp (Aspirin) .... Once daily 5)  Lotrisone 1-0.05 % Crea (Clotrimazole-betamethasone) .... Use two times a day 6)  Triamcinolone Acetonide 0.1 % Oint (Triamcinolone acetonide) .... Use 1-2 times a day 7)  Metformin Hcl 1000 Mg Tabs (Metformin hcl) .[name].. 1 by  mouth bid 8)  Fish Oil 1000 Mg Caps (Omega-3 fatty acids) .... 2 once daily 9)  Tribenzor 40-5-25 Mg Tabs (Olmesartan-amlodipine-hctz) .[name].. 1 by mouth qd  Other Orders: Shave Skin Lesion < 0.5 cm/trunk/arm/leg (11300)  Patient Instructions: 1)  Please schedule a follow-up appointment in 3 months. 2)  BMP prior to visit, ICD-9: 3)  HbgA1C prior to visit, ICD-9:250.00 Prescriptions: TRIBENZOR 40-5-25 MG TABS (OLMESARTAN-AMLODIPINE-HCTZ) 1 by mouth qd  #30 x 12   Entered and Authorized by:   Tresa Garter MD   Signed by:   Tresa Garter MD on 10/14/2009   Method used:   Print then Give to Patient   RxID:   [id number]

## 2010-04-03 ENCOUNTER — Other Ambulatory Visit (INDEPENDENT_AMBULATORY_CARE_PROVIDER_SITE_OTHER): Payer: Medicare Other

## 2010-04-03 ENCOUNTER — Other Ambulatory Visit: Payer: Self-pay | Admitting: Internal Medicine

## 2010-04-03 ENCOUNTER — Telehealth: Payer: Self-pay | Admitting: Internal Medicine

## 2010-04-03 DIAGNOSIS — E119 Type 2 diabetes mellitus without complications: Secondary | ICD-10-CM

## 2010-04-03 LAB — BASIC METABOLIC PANEL
BUN: 22 mg/dL (ref 6–23)
CO2: 29 mEq/L (ref 19–32)
Calcium: 9.6 mg/dL (ref 8.4–10.5)
GFR: 55.53 mL/min — ABNORMAL LOW (ref >60.00)
Glucose, Bld: 240 mg/dL — ABNORMAL HIGH (ref 70–99)

## 2010-04-03 NOTE — Telephone Encounter (Signed)
Stacey please call the patient his diabetes is not well controlled. He needs to keep his appointment. Thank you!

## 2010-04-03 NOTE — Progress Notes (Signed)
Error

## 2010-04-06 ENCOUNTER — Ambulatory Visit (INDEPENDENT_AMBULATORY_CARE_PROVIDER_SITE_OTHER): Payer: Medicare Other | Admitting: Internal Medicine

## 2010-04-06 ENCOUNTER — Encounter: Payer: Self-pay | Admitting: Internal Medicine

## 2010-04-06 DIAGNOSIS — E119 Type 2 diabetes mellitus without complications: Secondary | ICD-10-CM

## 2010-04-06 DIAGNOSIS — I10 Essential (primary) hypertension: Secondary | ICD-10-CM

## 2010-04-06 MED ORDER — SAXAGLIPTIN-METFORMIN ER 2.5-1000 MG PO TB24: 1.0000 | ORAL_TABLET | Freq: Two times a day (BID) | ORAL | Status: DC

## 2010-04-06 MED ORDER — OLMESARTAN-AMLODIPINE-HCTZ 40-10-25 MG PO TABS: 1.0000 | ORAL_TABLET | ORAL | Status: DC

## 2010-04-06 NOTE — Assessment & Plan Note (Signed)
Not well controlled. Increase dose of Amlodipine in Tribenzor

## 2010-04-06 NOTE — Progress Notes (Signed)
  Subjective:    Patient ID: Jeff Burgess, male    DOB: 10-Dec-1935, 75 y.o.   MRN: [id number]  HPI  The patient presents for a follow-up visit to check on  Poorly controlled diabetes, hyperlipidemia, HTN and wt gain   Review of Systems  Constitutional: Positive for unexpected weight change (gained wt). Negative for appetite change and fatigue.  HENT: Negative for nosebleeds, congestion, sore throat, sneezing, trouble swallowing and neck pain.   Eyes: Negative for itching and visual disturbance.  Respiratory: Negative for cough.   Cardiovascular: Negative for chest pain, palpitations and leg swelling.  Gastrointestinal: Negative for nausea, diarrhea, blood in stool and abdominal distention.  Genitourinary: Negative for frequency and hematuria.  Musculoskeletal: Negative for back pain, joint swelling and gait problem.  Skin: Negative for rash.  Neurological: Negative for dizziness, tremors, speech difficulty and weakness.  Psychiatric/Behavioral: Negative for sleep disturbance, dysphoric mood and agitation. The patient is not nervous/anxious.        Objective:   Physical Exam  Constitutional: He is oriented to person, place, and time. He appears well-developed.       Obese  HENT:  Mouth/Throat: Oropharynx is clear and moist.  Eyes: Conjunctivae are normal. Pupils are equal, round, and reactive to light.  Neck: Normal range of motion. No JVD present. No thyromegaly present.  Cardiovascular: Normal rate, regular rhythm, normal heart sounds and intact distal pulses.  Exam reveals no gallop and no friction rub.   No murmur heard. Pulmonary/Chest: Effort normal and breath sounds normal. No respiratory distress. He has no wheezes. He has no rales. He exhibits no tenderness.  Abdominal: Soft. Bowel sounds are normal. He exhibits no distension and no mass. There is no tenderness. There is no rebound and no guarding.  Musculoskeletal: Normal range of motion. He exhibits no edema and no  tenderness.  Lymphadenopathy:    He has no cervical adenopathy.  Neurological: He is alert and oriented to person, place, and time. He has normal reflexes. No cranial nerve deficit. He exhibits normal muscle tone. Coordination normal.  Skin: Skin is warm and dry. No rash noted.  Psychiatric: He has a normal mood and affect. His behavior is normal. Judgment and thought content normal.          Assessment & Plan:   Lab Results  Component Value Date   HGBA1C 9.2* 04/03/2010   Wt Readings from Last 3 Encounters:  04/06/10 232 lb (105.235 kg)  10/14/09 225 lb (102.059 kg)  06/13/09 225 lb 8 oz (102.286 kg)    DIABETES MELLITUS, TYPE II - uncontrolled Much worse. Change to Kombiglyze. Loose wt. Endocr cons  HYPERTENSION Not well controlled. Increase dose of Amlodipine in Tribenzor   >45 min visit  Wt gain --- discussed wt loss need

## 2010-04-06 NOTE — Telephone Encounter (Signed)
Pt informed

## 2010-04-06 NOTE — Patient Instructions (Signed)
Read about Victoza

## 2010-04-06 NOTE — Assessment & Plan Note (Signed)
Worse. Change to Kombiglyze. Loose wt.

## 2010-04-13 ENCOUNTER — Telehealth: Payer: Self-pay | Admitting: Internal Medicine

## 2010-04-13 DIAGNOSIS — I10 Essential (primary) hypertension: Secondary | ICD-10-CM

## 2010-04-13 MED ORDER — OLMESARTAN-AMLODIPINE-HCTZ 40-10-25 MG PO TABS: ORAL_TABLET | ORAL | Status: DC

## 2010-04-13 NOTE — Telephone Encounter (Signed)
1 po qd

## 2010-04-13 NOTE — Telephone Encounter (Signed)
Johnny form [employer] called requesting directions clarification for the Tribenzor 40/10/25 - caller states that sig reads: Take [1] tablet by mouth [1] dose over 46 hours Dispense #100 Please advise.

## 2010-04-14 ENCOUNTER — Ambulatory Visit (INDEPENDENT_AMBULATORY_CARE_PROVIDER_SITE_OTHER): Payer: Medicare Other | Admitting: Endocrinology

## 2010-04-14 ENCOUNTER — Encounter: Payer: Self-pay | Admitting: Endocrinology

## 2010-04-14 VITALS — BP 142/74 | HR 83 | Temp 97.5°F | Ht 69.0 in | Wt 231.4 lb

## 2010-04-14 DIAGNOSIS — E1100 Type 2 diabetes mellitus with hyperosmolarity without nonketotic hyperglycemic-hyperosmolar coma (NKHHC): Secondary | ICD-10-CM

## 2010-04-14 MED ORDER — GLUCOSE BLOOD VI STRP: ORAL_STRIP | Status: DC

## 2010-04-14 NOTE — Telephone Encounter (Signed)
Jeff Burgess at [facility] informed

## 2010-04-14 NOTE — Progress Notes (Signed)
  Subjective:    Patient ID: Jeff Burgess, male    DOB: July 28, 1935, 75 y.o.   MRN: [id number]  HPI pt states 5 years h/o dm.  it is complicated by cad and renal insufficiency.  he has never been on insulin.  he takes "kombliglyze."  pt says his diet and exercise are much improved recently.  He reports few years of slightly easy bruising of the forearms, but no assoc numbness Past Medical History  Diagnosis Date  . CAD (coronary artery disease)   . Hyperlipemia   . Hypertension   . OA (osteoarthritis)   . LBP (low back pain)   . Renal insufficiency   . Diabetes mellitus, type 2   . ED (erectile dysfunction)   . AK (actinic keratosis)   . Ventral hernia     midline   Past Surgical History  Procedure Date  . Coronary artery bypass graft 1998    reports that he has quit smoking. He does not have any smokeless tobacco history on file. His alcohol and drug histories not on file. family history includes Hypertension in his other.  Brother has diabetes He works part time reading water meters.  He was widowed, and remarried in 2004.  Allergies  Allergen Reactions  . Morphine Sulfate       Review of Systems denies blurry vision, headache, chest pain, sob, n/v, urinary frequency, cramps, excessive diaphoresis, memory loss, depression, hypoglycemia, and rhinorrhea.  He has lost a few lb recently, due to his efforts.     Objective:   Physical Exam VS: see vs page GEN: no distress.  obese HEAD: no deformity eyes: no periorbital swelling, no proptosis external nose and ears are normal mouth: no lesion seen NECK: supple, thyroid is not enlarged CHEST WALL: no deformity BREASTS:  No gynecomastia CV: reg rate and rhythm, no murmur ABD: abdomen is soft, nontender.  no hepatosplenomegaly.  not distended.  There is a self-reducing ventral hernia MUSCULOSKELETAL: muscle bulk and strength are grossly normal.  no obvious joint swelling.  gait is normal and steady EXTEMITIES: no deformity.   no ulcer on the feet.  feet are of normal color and temp.  no edema.  There is bilateral onychomycosis PULSES: dorsalis pedis intact bilat.  no carotid bruit NEURO:  cn 2-12 grossly intact.   readily moves all 4's.  sensation is intact to touch on the feet SKIN:  Normal texture and temperature.  No rash or suspicious lesion is visible.  No ecchymoses on the forearms now.  NODES:  None palpable at the neck. PSYCH: alert, oriented x3.  Does not appear anxious nor depressed.   Labs: a1c=9.2 Creat=1.3 Assessment & Plan:  Dm, needs increased rx.  He declines additional medication, saying he want to work on his weight instead.  Renal insuff.  This is not enough to merit discontinuation of the metformin Cad.  He should avoid hypoglycemia if at all possible.

## 2010-04-14 NOTE — Patient Instructions (Addendum)
Here are 2 blood-sugar meters.  i have sent a prescription for strips to [facility]. good diet and exercise habits significanly improve the control of your diabetes.  please let me know if you wish to be referred to a dietician.  high blood sugar is very risky to your health.  you should see an eye doctor every year. controlling your blood pressure and cholesterol drastically reduces the damage diabetes does to your body.  this also applies to quitting smoking.  please discuss these with your doctor.  you should take an aspirin every day, unless you have been advised by a doctor not to. check your blood sugar 1 time a day.  vary the time of day when you check, between before the 3 meals, and at bedtime.  also check if you have symptoms of your blood sugar being too high or too low.  please keep a record of the readings and bring it to your next appointment here.  please call us sooner if you are having low blood sugar episodes. Please make a follow-up appointment in 3 months.

## 2010-07-03 ENCOUNTER — Other Ambulatory Visit (INDEPENDENT_AMBULATORY_CARE_PROVIDER_SITE_OTHER): Payer: Medicare Other

## 2010-07-03 DIAGNOSIS — I10 Essential (primary) hypertension: Secondary | ICD-10-CM

## 2010-07-03 DIAGNOSIS — E119 Type 2 diabetes mellitus without complications: Secondary | ICD-10-CM

## 2010-07-03 LAB — LIPID PANEL
LDL Cholesterol: 45 mg/dL (ref 0–99)
Total CHOL/HDL Ratio: 4
VLDL: 38 mg/dL (ref 0.0–40.0)

## 2010-07-03 LAB — URINALYSIS
Bilirubin Urine: NEGATIVE
Hgb urine dipstick: NEGATIVE
Ketones, ur: NEGATIVE
Urine Glucose: NEGATIVE
Urobilinogen, UA: 0.2 (ref 0.0–1.0)

## 2010-07-03 LAB — BASIC METABOLIC PANEL
CO2: 31 mEq/L (ref 19–32)
Calcium: 9.9 mg/dL (ref 8.4–10.5)
Creatinine, Ser: 1.5 mg/dL (ref 0.4–1.5)
Glucose, Bld: 140 mg/dL — ABNORMAL HIGH (ref 70–99)

## 2010-07-06 ENCOUNTER — Encounter: Payer: Self-pay | Admitting: Internal Medicine

## 2010-07-06 ENCOUNTER — Telehealth: Payer: Self-pay | Admitting: Internal Medicine

## 2010-07-06 ENCOUNTER — Ambulatory Visit (INDEPENDENT_AMBULATORY_CARE_PROVIDER_SITE_OTHER): Payer: Medicare Other | Admitting: Internal Medicine

## 2010-07-06 DIAGNOSIS — I2510 Atherosclerotic heart disease of native coronary artery without angina pectoris: Secondary | ICD-10-CM

## 2010-07-06 DIAGNOSIS — I10 Essential (primary) hypertension: Secondary | ICD-10-CM

## 2010-07-06 DIAGNOSIS — N259 Disorder resulting from impaired renal tubular function, unspecified: Secondary | ICD-10-CM

## 2010-07-06 DIAGNOSIS — E875 Hyperkalemia: Secondary | ICD-10-CM

## 2010-07-06 DIAGNOSIS — E1100 Type 2 diabetes mellitus with hyperosmolarity without nonketotic hyperglycemic-hyperosmolar coma (NKHHC): Secondary | ICD-10-CM

## 2010-07-06 NOTE — Assessment & Plan Note (Signed)
We will recheck K in 3 mo

## 2010-07-06 NOTE — Assessment & Plan Note (Signed)
Monitoring

## 2010-07-06 NOTE — Telephone Encounter (Signed)
Jeff Burgess, please, inform patient that all labs are better. Needs a ROV Thx

## 2010-07-06 NOTE — Assessment & Plan Note (Signed)
On Rx 

## 2010-07-06 NOTE — Progress Notes (Signed)
  Subjective:    Patient ID: Jeff Burgess, male    DOB: 1935/11/12, 75 y.o.   MRN: [id number]  HPI  The patient presents for a follow-up of  chronic hypertension, chronic dyslipidemia, type 2 diabetes controlled with medicines He lost wt, 22 lbs - better diet     Review of Systems  Constitutional: Negative for appetite change, fatigue and unexpected weight change.       Lost wt on diet  HENT: Negative for nosebleeds, congestion, sore throat, sneezing, trouble swallowing and neck pain.   Eyes: Negative for itching and visual disturbance.  Respiratory: Negative for cough.   Cardiovascular: Negative for chest pain, palpitations and leg swelling.  Gastrointestinal: Negative for nausea, diarrhea, blood in stool and abdominal distention.  Genitourinary: Negative for frequency and hematuria.  Musculoskeletal: Negative for back pain, joint swelling and gait problem.  Skin: Negative for rash.  Neurological: Negative for dizziness, tremors, speech difficulty and weakness.  Psychiatric/Behavioral: Negative for sleep disturbance, dysphoric mood and agitation. The patient is not nervous/anxious.    Wt Readings from Last 3 Encounters:  07/06/10 208 lb (94.348 kg)  04/14/10 231 lb 6.4 oz (104.962 kg)  04/06/10 232 lb (105.235 kg)       Objective:   Physical Exam  Constitutional: He is oriented to person, place, and time. He appears well-developed.       Obese  HENT:  Mouth/Throat: Oropharynx is clear and moist.  Eyes: Conjunctivae are normal. Pupils are equal, round, and reactive to light.  Neck: Normal range of motion. No JVD present. No thyromegaly present.  Cardiovascular: Normal rate, regular rhythm, normal heart sounds and intact distal pulses.  Exam reveals no gallop and no friction rub.   No murmur heard. Pulmonary/Chest: Effort normal and breath sounds normal. No respiratory distress. He has no wheezes. He has no rales. He exhibits no tenderness.  Abdominal: Soft. Bowel sounds  are normal. He exhibits no distension and no mass. There is no tenderness. There is no rebound and no guarding.  Musculoskeletal: Normal range of motion. He exhibits no edema and no tenderness.  Lymphadenopathy:    He has no cervical adenopathy.  Neurological: He is alert and oriented to person, place, and time. He has normal reflexes. No cranial nerve deficit. He exhibits normal muscle tone. Coordination normal.  Skin: Skin is warm and dry. No rash noted.  Psychiatric: He has a normal mood and affect. His behavior is normal. Judgment and thought content normal.       BP Readings from Last 3 Encounters:  07/06/10 148/70  04/14/10 142/74  04/06/10 160/82     Lab Results  Component Value Date   WBC 8.0 10/09/2009   HGB 14.2 10/09/2009   HCT 40.3 10/09/2009   PLT 196.0 10/09/2009   CHOL 112 07/03/2010   TRIG 190.0* 07/03/2010   HDL 28.80* 07/03/2010   ALT 27 10/09/2009   AST 24 10/09/2009   NA 140 07/03/2010   K 5.3* 07/03/2010   CL 104 07/03/2010   CREATININE 1.5 07/03/2010   BUN 27* 07/03/2010   CO2 31 07/03/2010   TSH 1.62 10/09/2009   PSA 1.38 10/09/2009   HGBA1C 6.5 07/03/2010    Assessment & Plan:

## 2010-07-06 NOTE — Assessment & Plan Note (Signed)
A1c is better w/wt loss and better diet

## 2010-07-06 NOTE — Assessment & Plan Note (Signed)
Cont Rx 

## 2010-07-08 NOTE — Telephone Encounter (Signed)
Pt informed. His appt is 10-09-10. Does he need to come before then?

## 2010-07-28 ENCOUNTER — Encounter: Payer: Self-pay | Admitting: Endocrinology

## 2010-07-28 ENCOUNTER — Ambulatory Visit (INDEPENDENT_AMBULATORY_CARE_PROVIDER_SITE_OTHER): Payer: Medicare Other | Admitting: Endocrinology

## 2010-07-28 DIAGNOSIS — E1100 Type 2 diabetes mellitus with hyperosmolarity without nonketotic hyperglycemic-hyperosmolar coma (NKHHC): Secondary | ICD-10-CM

## 2010-07-28 MED ORDER — OLMESARTAN-AMLODIPINE-HCTZ 40-5-25 MG PO TABS: 1.0000 | ORAL_TABLET | Freq: Every day | ORAL | Status: DC

## 2010-07-28 NOTE — Progress Notes (Signed)
Subjective:    Patient ID: Jeff Burgess, male    DOB: [birth year], 75 y.o.   MRN: [id number]  HPI Pt continues to lose weight, due to his efforts. pt states he feels well in general.  he brings a record of his cbg's which i have reviewed today.  It varies from 90-200, but most are in the low-100's. Pt tates a few weeks of slight dizziness sensation in the head, in the context of bending over.  He checked bp then, and it was 99 systolic.  No assoc loc. Past Medical History  Diagnosis Date  . CAD (coronary artery disease)   . Hyperlipemia   . Hypertension   . OA (osteoarthritis)   . LBP (low back pain)   . Renal insufficiency   . Diabetes mellitus, type 2   . ED (erectile dysfunction)   . AK (actinic keratosis)   . Ventral hernia     midline    Past Surgical History  Procedure Date  . Coronary artery bypass graft 1998    History   Social History  . Marital Status: Married    Spouse Name: N/A    Number of Children: N/A  . Years of Education: N/A   Occupational History  . retired     but working fulltime   Social History Main Topics  . Smoking status: Former Games developer  . Smokeless tobacco: Not on file  . Alcohol Use: No  . Drug Use: No  . Sexually Active: Yes   Other Topics Concern  . Not on file   Social History Narrative   Regular exercise-yes    Current Outpatient Prescriptions on File Prior to Visit  Medication Sig Dispense Refill  . aspirin 81 MG tablet Take 81 mg by mouth daily.        . Cholecalciferol (VITAMIN D3) 1000 UNITS tablet Take 1,000 Units by mouth daily.        . clotrimazole-betamethasone (LOTRISONE) cream Apply 1 application topically 2 (two) times daily.        [name] glucose blood (ONE TOUCH ULTRA TEST) test strip check your blood sugar 1 time a day.  vary the time of day when you check, between before the 3 meals, and at bedtime.  100 each  12  . lovastatin (MEVACOR) 40 MG tablet Take 40 mg by mouth daily.        . naproxen (NAPROSYN) 500 MG  tablet Take 500 mg by mouth 2 (two) times daily with a meal. As needed for pain.       . Omega-3 Fatty Acids (FISH OIL) 1000 MG CAPS Take 2 capsules by mouth daily.        . Saxagliptin-Metformin (KOMBIGLYZE XR) 2.05-998 MG TB24 Take 1 tablet by mouth 2 (two) times daily.  200 tablet  3  . triamcinolone (KENALOG) 0.1 % ointment Apply 1 application topically 2 (two) times daily.          Allergies  Allergen Reactions  . Morphine Sulfate     Family History  Problem Relation Age of Onset  . Hypertension Other   . Arthritis Mother 38    RA  . Alcohol abuse Father 55    exposure    BP 132/70  Pulse 67  Temp(Src) 97.1 F (36.2 C) (Oral)  Ht 5\' 9"  (1.753 m)  Wt 206 lb 12.8 oz (93.804 kg)  BMI 30.54 kg/m2  SpO2 96%    Review of Systems  Constitutional: Negative for fever.  Eyes: Negative for  visual disturbance.  Respiratory: Negative for shortness of breath.   Cardiovascular: Negative for chest pain.  Gastrointestinal: Negative for nausea and diarrhea.  Genitourinary: Negative for frequency.  Musculoskeletal: Negative for myalgias.  Neurological: Negative for weakness.  Hematological: Does not bruise/bleed easily.  Psychiatric/Behavioral: Negative for dysphoric mood.   denies hypoglycemia    Objective:   Physical Exam GENERAL: no distress Ext: no edema Gait: normal and steady    Lab Results  Component Value Date   HGBA1C 6.5 07/03/2010   Assessment & Plan:  Weight loss, due to his efforts htn is now slightly overcontrolled, due to weight loss Dizziness, prob due to overcontrol of htn.  He is at risk for syncope. Dm, with improved control due to weight loss.

## 2010-07-28 NOTE — Patient Instructions (Addendum)
check your blood sugar 1 time a day.  vary the time of day when you check, between before the 3 meals, and at bedtime.  also check if you have symptoms of your blood sugar being too high or too low.  please keep a record of the readings and bring it to your next appointment here.  please call us sooner if you are having low blood sugar episodes.   blood tests are being ordered for you today.  please call [phone] to hear your test results.  You will be prompted to enter the 9-digit "MRN" number that appears at the top left of this page, followed by #.  Then you will hear the message. Continue your weight-loss efforts. Please make a follow-up appointment in 3 months.   Reduce tribenzor to 40/5/25, 1 tab daily.   (update: i left message on phone-tree:  rx as we discussed)

## 2010-09-08 ENCOUNTER — Encounter: Payer: Self-pay | Admitting: Gastroenterology

## 2010-10-09 ENCOUNTER — Ambulatory Visit: Payer: Medicare Other | Admitting: Internal Medicine

## 2010-10-28 ENCOUNTER — Encounter: Payer: Self-pay | Admitting: Endocrinology

## 2010-10-28 ENCOUNTER — Ambulatory Visit (INDEPENDENT_AMBULATORY_CARE_PROVIDER_SITE_OTHER): Payer: Medicare Other | Admitting: Endocrinology

## 2010-10-28 ENCOUNTER — Other Ambulatory Visit: Payer: Self-pay | Admitting: Endocrinology

## 2010-10-28 ENCOUNTER — Other Ambulatory Visit (INDEPENDENT_AMBULATORY_CARE_PROVIDER_SITE_OTHER): Payer: Medicare Other

## 2010-10-28 DIAGNOSIS — N259 Disorder resulting from impaired renal tubular function, unspecified: Secondary | ICD-10-CM

## 2010-10-28 DIAGNOSIS — E1100 Type 2 diabetes mellitus with hyperosmolarity without nonketotic hyperglycemic-hyperosmolar coma (NKHHC): Secondary | ICD-10-CM

## 2010-10-28 LAB — HEMOGLOBIN A1C: Hgb A1c MFr Bld: 6.3 % (ref 4.6–6.5)

## 2010-10-28 LAB — COMPREHENSIVE METABOLIC PANEL
CO2: 29 mEq/L (ref 19–32)
Calcium: 9.6 mg/dL (ref 8.4–10.5)
Chloride: 101 mEq/L (ref 96–112)
Creatinine, Ser: 1.3 mg/dL (ref 0.4–1.5)
GFR: 56.91 mL/min — ABNORMAL LOW (ref >60.00)
Glucose, Bld: 134 mg/dL — ABNORMAL HIGH (ref 70–99)
Total Bilirubin: 0.6 mg/dL (ref 0.3–1.2)

## 2010-10-28 LAB — BASIC METABOLIC PANEL
BUN: 28 mg/dL — ABNORMAL HIGH (ref 6–23)
Calcium: 9.5 mg/dL (ref 8.4–10.5)
GFR: 60.63 mL/min (ref >60.00)
Glucose, Bld: 136 mg/dL — ABNORMAL HIGH (ref 70–99)
Potassium: 4.3 mEq/L (ref 3.5–5.1)
Sodium: 137 mEq/L (ref 135–145)

## 2010-10-28 NOTE — Progress Notes (Signed)
  Subjective:    Patient ID: Jeff Burgess, male    DOB: 10/05/1935, 75 y.o.   MRN: [id number]  HPI pt states he feels well in general.  Weight-loss has leveled off.  no cbg record, but states cbg's are well-controlled Past Medical History  Diagnosis Date  . CAD (coronary artery disease)   . Hyperlipemia   . Hypertension   . OA (osteoarthritis)   . LBP (low back pain)   . Renal insufficiency   . Diabetes mellitus, type 2   . ED (erectile dysfunction)   . AK (actinic keratosis)   . Ventral hernia     midline    Past Surgical History  Procedure Date  . Coronary artery bypass graft 1998    History   Social History  . Marital Status: Married    Spouse Name: N/A    Number of Children: N/A  . Years of Education: N/A   Occupational History  . retired     but working fulltime   Social History Main Topics  . Smoking status: Former Games developer  . Smokeless tobacco: Not on file  . Alcohol Use: No  . Drug Use: No  . Sexually Active: Yes   Other Topics Concern  . Not on file   Social History Narrative   Regular exercise-yes    Current Outpatient Prescriptions on File Prior to Visit  Medication Sig Dispense Refill  . aspirin 81 MG tablet Take 81 mg by mouth daily.        . Cholecalciferol (VITAMIN D3) 1000 UNITS tablet Take 1,000 Units by mouth daily.        . clotrimazole-betamethasone (LOTRISONE) cream Apply 1 application topically as needed.       [name] glucose blood (ONE TOUCH ULTRA TEST) test strip check your blood sugar 1 time a day.  vary the time of day when you check, between before the 3 meals, and at bedtime.  100 each  12  . Olmesartan-Amlodipine-HCTZ (TRIBENZOR) 40-5-25 MG TABS Take 1 tablet by mouth daily.  30 tablet  11  . Omega-3 Fatty Acids (FISH OIL) 1000 MG CAPS Take 2 capsules by mouth daily.        . Saxagliptin-Metformin (KOMBIGLYZE XR) 2.05-998 MG TB24 Take 1 tablet by mouth 2 (two) times daily.  200 tablet  3  . triamcinolone (KENALOG) 0.1 % ointment  Apply 1 application topically as needed.         Allergies  Allergen Reactions  . Morphine Sulfate     Family History  Problem Relation Age of Onset  . Hypertension Other   . Arthritis Mother 38    RA  . Alcohol abuse Father 55    exposure    BP 136/70  Pulse 82  Temp(Src) 97.8 F (36.6 C) (Oral)  Ht 5\' 9"  (1.753 m)  Wt 210 lb (95.255 kg)  BMI 31.01 kg/m2  SpO2 98%    Review of Systems Denies sob/chest pain/edema/hypoglycemia    Objective:   Physical Exam VITAL SIGNS:  See vs page GENERAL: no distress Pulses: dorsalis pedis intact bilat.   Feet: no deformity.  no ulcer on the feet.  feet are of normal color and temp.  no edema Neuro: sensation is intact to touch on the feet. Lab Results  Component Value Date   HGBA1C 6.2 10/28/2010      Assessment & Plan:  Dm, well-controlled

## 2010-10-28 NOTE — Patient Instructions (Addendum)
check your blood sugar 1 time a day.  vary the time of day when you check, between before the 3 meals, and at bedtime.  also check if you have symptoms of your blood sugar being too high or too low.  please keep a record of the readings and bring it to your next appointment here.  please call us sooner if you are having low blood sugar episodes.   blood tests are being ordered for you today.  please call [phone] to hear your test results.  You will be prompted to enter the 9-digit "MRN" number that appears at the top left of this page, followed by #.  Then you will hear the message. Continue your weight-loss efforts. Please make a follow-up appointment in 3 months.

## 2010-10-29 ENCOUNTER — Other Ambulatory Visit: Payer: Self-pay | Admitting: *Deleted

## 2010-10-29 MED ORDER — LOVASTATIN 40 MG PO TABS: 40.0000 mg | ORAL_TABLET | Freq: Every day | ORAL | Status: DC

## 2010-11-10 ENCOUNTER — Encounter: Payer: Self-pay | Admitting: Internal Medicine

## 2010-11-10 ENCOUNTER — Ambulatory Visit (INDEPENDENT_AMBULATORY_CARE_PROVIDER_SITE_OTHER): Payer: Medicare Other | Admitting: Internal Medicine

## 2010-11-10 VITALS — BP 140/82 | HR 78 | Temp 97.0°F | Wt 214.0 lb

## 2010-11-10 DIAGNOSIS — R5381 Other malaise: Secondary | ICD-10-CM

## 2010-11-10 DIAGNOSIS — R5383 Other fatigue: Secondary | ICD-10-CM | POA: Insufficient documentation

## 2010-11-10 DIAGNOSIS — N259 Disorder resulting from impaired renal tubular function, unspecified: Secondary | ICD-10-CM

## 2010-11-10 DIAGNOSIS — M545 Low back pain: Secondary | ICD-10-CM

## 2010-11-10 DIAGNOSIS — I10 Essential (primary) hypertension: Secondary | ICD-10-CM

## 2010-11-10 DIAGNOSIS — E1100 Type 2 diabetes mellitus with hyperosmolarity without nonketotic hyperglycemic-hyperosmolar coma (NKHHC): Secondary | ICD-10-CM

## 2010-11-10 NOTE — Assessment & Plan Note (Signed)
Continue with current prescription therapy as reflected on the Med list. BP Readings from Last 3 Encounters:  11/10/10 140/82  10/28/10 136/70  07/28/10 132/70

## 2010-11-10 NOTE — Progress Notes (Signed)
  Subjective:    Patient ID: Jeff Burgess, male    DOB: 29-Aug-1935, 75 y.o.   MRN: [id number]  HPI  The patient presents for a follow-up of  chronic hypertension, chronic dyslipidemia, type 2 diabetes controlled with medicines. C/o fatigue Wt Readings from Last 3 Encounters:  11/10/10 214 lb (97.07 kg)  10/28/10 210 lb (95.255 kg)  07/28/10 206 lb 12.8 oz (93.804 kg)      Review of Systems  Constitutional: Negative for appetite change and unexpected weight change.  HENT: Negative for nosebleeds, congestion, sore throat, sneezing, trouble swallowing and neck pain.   Eyes: Negative for itching and visual disturbance.  Respiratory: Negative for cough.   Cardiovascular: Negative for chest pain, palpitations and leg swelling.  Gastrointestinal: Negative for nausea, diarrhea, blood in stool and abdominal distention.  Genitourinary: Negative for frequency and hematuria.  Musculoskeletal: Negative for back pain, joint swelling and gait problem.  Skin: Negative for rash.  Neurological: Negative for dizziness, tremors, speech difficulty and weakness.  Psychiatric/Behavioral: Negative for sleep disturbance, dysphoric mood and agitation. The patient is not nervous/anxious.        Objective:   Physical Exam  Constitutional: He is oriented to person, place, and time. He appears well-developed.       obese  HENT:  Mouth/Throat: Oropharynx is clear and moist.  Eyes: Conjunctivae are normal. Pupils are equal, round, and reactive to light.  Neck: Normal range of motion. No JVD present. No thyromegaly present.  Cardiovascular: Normal rate, regular rhythm, normal heart sounds and intact distal pulses.  Exam reveals no gallop and no friction rub.   No murmur heard. Pulmonary/Chest: Effort normal and breath sounds normal. No respiratory distress. He has no wheezes. He has no rales. He exhibits no tenderness.  Abdominal: Soft. Bowel sounds are normal. He exhibits no distension and no mass. There  is no tenderness. There is no rebound and no guarding.  Musculoskeletal: Normal range of motion. He exhibits no edema and no tenderness.  Lymphadenopathy:    He has no cervical adenopathy.  Neurological: He is alert and oriented to person, place, and time. He has normal reflexes. No cranial nerve deficit. He exhibits normal muscle tone. Coordination normal.  Skin: Skin is warm and dry. No rash noted.  Psychiatric: He has a normal mood and affect. His behavior is normal. Judgment and thought content normal.   Lab Results  Component Value Date   WBC 8.0 10/09/2009   HGB 14.2 10/09/2009   HCT 40.3 10/09/2009   PLT 196.0 10/09/2009   GLUCOSE 136* 10/28/2010   CHOL 112 07/03/2010   TRIG 190.0* 07/03/2010   HDL 28.80* 07/03/2010   LDLCALC 45 07/03/2010   ALT 21 10/28/2010   AST 20 10/28/2010   NA 137 10/28/2010   K 4.3 10/28/2010   CL 102 10/28/2010   CREATININE 1.2 10/28/2010   BUN 28* 10/28/2010   CO2 29 10/28/2010   TSH 1.62 10/09/2009   PSA 1.38 10/09/2009   HGBA1C 6.2 10/28/2010          Assessment & Plan:

## 2010-11-10 NOTE — Assessment & Plan Note (Signed)
Continue with current prescription therapy as reflected on the Med list.  

## 2010-11-10 NOTE — Patient Instructions (Signed)
Wt Readings from Last 3 Encounters:  11/10/10 214 lb (97.07 kg)  10/28/10 210 lb (95.255 kg)  07/28/10 206 lb 12.8 oz (93.804 kg)   BP Readings from Last 3 Encounters:  11/10/10 140/82  10/28/10 136/70  07/28/10 132/70

## 2010-11-10 NOTE — Assessment & Plan Note (Signed)
.  .  .        xx

## 2010-11-10 NOTE — Assessment & Plan Note (Signed)
10/12 mild TSH, testost to check

## 2011-02-25 ENCOUNTER — Other Ambulatory Visit: Payer: Self-pay | Admitting: *Deleted

## 2011-02-25 DIAGNOSIS — E119 Type 2 diabetes mellitus without complications: Secondary | ICD-10-CM

## 2011-02-25 MED ORDER — SAXAGLIPTIN-METFORMIN ER 2.5-1000 MG PO TB24: 1.0000 | ORAL_TABLET | Freq: Two times a day (BID) | ORAL | Status: DC

## 2011-03-08 ENCOUNTER — Other Ambulatory Visit (INDEPENDENT_AMBULATORY_CARE_PROVIDER_SITE_OTHER): Payer: Medicare Other

## 2011-03-08 DIAGNOSIS — R5383 Other fatigue: Secondary | ICD-10-CM

## 2011-03-08 DIAGNOSIS — E1100 Type 2 diabetes mellitus with hyperosmolarity without nonketotic hyperglycemic-hyperosmolar coma (NKHHC): Secondary | ICD-10-CM

## 2011-03-08 DIAGNOSIS — M545 Low back pain: Secondary | ICD-10-CM

## 2011-03-08 DIAGNOSIS — I10 Essential (primary) hypertension: Secondary | ICD-10-CM

## 2011-03-08 DIAGNOSIS — N259 Disorder resulting from impaired renal tubular function, unspecified: Secondary | ICD-10-CM

## 2011-03-08 DIAGNOSIS — R5381 Other malaise: Secondary | ICD-10-CM

## 2011-03-08 LAB — BASIC METABOLIC PANEL
Calcium: 9.6 mg/dL (ref 8.4–10.5)
GFR: 55.87 mL/min — ABNORMAL LOW (ref >60.00)
Glucose, Bld: 159 mg/dL — ABNORMAL HIGH (ref 70–99)
Potassium: 4.8 mEq/L (ref 3.5–5.1)
Sodium: 141 mEq/L (ref 135–145)

## 2011-03-08 LAB — TESTOSTERONE: Testosterone: 253 ng/dL — ABNORMAL LOW (ref 350.00–890.00)

## 2011-03-08 LAB — TSH: TSH: 2.11 u[IU]/mL (ref 0.35–5.50)

## 2011-03-08 LAB — HEMOGLOBIN A1C: Hgb A1c MFr Bld: 6.6 % — ABNORMAL HIGH (ref 4.6–6.5)

## 2011-03-10 ENCOUNTER — Encounter: Payer: Self-pay | Admitting: Internal Medicine

## 2011-03-10 ENCOUNTER — Ambulatory Visit (INDEPENDENT_AMBULATORY_CARE_PROVIDER_SITE_OTHER): Payer: Medicare Other | Admitting: Internal Medicine

## 2011-03-10 DIAGNOSIS — E785 Hyperlipidemia, unspecified: Secondary | ICD-10-CM

## 2011-03-10 DIAGNOSIS — M545 Low back pain: Secondary | ICD-10-CM

## 2011-03-10 DIAGNOSIS — I2510 Atherosclerotic heart disease of native coronary artery without angina pectoris: Secondary | ICD-10-CM

## 2011-03-10 DIAGNOSIS — N058 Unspecified nephritic syndrome with other morphologic changes: Secondary | ICD-10-CM

## 2011-03-10 DIAGNOSIS — N259 Disorder resulting from impaired renal tubular function, unspecified: Secondary | ICD-10-CM

## 2011-03-10 DIAGNOSIS — E1129 Type 2 diabetes mellitus with other diabetic kidney complication: Secondary | ICD-10-CM | POA: Insufficient documentation

## 2011-03-10 DIAGNOSIS — E291 Testicular hypofunction: Secondary | ICD-10-CM

## 2011-03-10 MED ORDER — LOSARTAN POTASSIUM-HCTZ 100-25 MG PO TABS: 1.0000 | ORAL_TABLET | Freq: Every day | ORAL | Status: DC

## 2011-03-10 MED ORDER — AMLODIPINE BESYLATE 10 MG PO TABS: 10.0000 mg | ORAL_TABLET | Freq: Every day | ORAL | Status: DC

## 2011-03-10 NOTE — Assessment & Plan Note (Signed)
Continue with current prescription therapy as reflected on the Med list.  

## 2011-03-10 NOTE — Assessment & Plan Note (Signed)
Declined Rx 

## 2011-03-22 NOTE — Progress Notes (Signed)
Patient ID: Jeff Burgess, male   DOB: 13-Feb-1935, 76 y.o.   MRN: [id number]  Subjective:    Patient ID: Jeff Burgess, male    DOB: 1935-09-27, 76 y.o.   MRN: [id number]  HPI  The patient presents for a follow-up of  chronic hypertension, chronic dyslipidemia, type 2 diabetes controlled with medicines. C/o fatigue  Wt Readings from Last 3 Encounters:  03/10/11 219 lb (99.338 kg)  11/10/10 214 lb (97.07 kg)  10/28/10 210 lb (95.255 kg)      Review of Systems  Constitutional: Negative for appetite change and unexpected weight change.  HENT: Negative for nosebleeds, congestion, sore throat, sneezing, trouble swallowing and neck pain.   Eyes: Negative for itching and visual disturbance.  Respiratory: Negative for cough.   Cardiovascular: Negative for chest pain, palpitations and leg swelling.  Gastrointestinal: Negative for nausea, diarrhea, blood in stool and abdominal distention.  Genitourinary: Negative for frequency and hematuria.  Musculoskeletal: Negative for back pain, joint swelling and gait problem.  Skin: Negative for rash.  Neurological: Negative for dizziness, tremors, speech difficulty and weakness.  Psychiatric/Behavioral: Negative for sleep disturbance, dysphoric mood and agitation. The patient is not nervous/anxious.        Objective:   Physical Exam  Constitutional: He is oriented to person, place, and time. He appears well-developed.       obese  HENT:  Mouth/Throat: Oropharynx is clear and moist.  Eyes: Conjunctivae are normal. Pupils are equal, round, and reactive to light.  Neck: Normal range of motion. No JVD present. No thyromegaly present.  Cardiovascular: Normal rate, regular rhythm, normal heart sounds and intact distal pulses.  Exam reveals no gallop and no friction rub.   No murmur heard. Pulmonary/Chest: Effort normal and breath sounds normal. No respiratory distress. He has no wheezes. He has no rales. He exhibits no tenderness.  Abdominal: Soft.  Bowel sounds are normal. He exhibits no distension and no mass. There is no tenderness. There is no rebound and no guarding.  Musculoskeletal: Normal range of motion. He exhibits no edema and no tenderness.  Lymphadenopathy:    He has no cervical adenopathy.  Neurological: He is alert and oriented to person, place, and time. He has normal reflexes. No cranial nerve deficit. He exhibits normal muscle tone. Coordination normal.  Skin: Skin is warm and dry. No rash noted.  Psychiatric: He has a normal mood and affect. His behavior is normal. Judgment and thought content normal.   Lab Results  Component Value Date   WBC 8.0 10/09/2009   HGB 14.2 10/09/2009   HCT 40.3 10/09/2009   PLT 196.0 10/09/2009   GLUCOSE 159* 03/08/2011   CHOL 112 07/03/2010   TRIG 190.0* 07/03/2010   HDL 28.80* 07/03/2010   LDLCALC 45 07/03/2010   ALT 21 10/28/2010   AST 20 10/28/2010   NA 141 03/08/2011   K 4.8 03/08/2011   CL 106 03/08/2011   CREATININE 1.3 03/08/2011   BUN 24* 03/08/2011   CO2 30 03/08/2011   TSH 2.11 03/08/2011   PSA 1.38 10/09/2009   HGBA1C 6.6* 03/08/2011          Assessment & Plan:

## 2011-06-30 ENCOUNTER — Other Ambulatory Visit: Payer: Self-pay | Admitting: *Deleted

## 2011-06-30 DIAGNOSIS — E119 Type 2 diabetes mellitus without complications: Secondary | ICD-10-CM

## 2011-06-30 MED ORDER — SAXAGLIPTIN-METFORMIN ER 2.5-1000 MG PO TB24: 1.0000 | ORAL_TABLET | Freq: Two times a day (BID) | ORAL | Status: DC

## 2011-07-06 ENCOUNTER — Other Ambulatory Visit (INDEPENDENT_AMBULATORY_CARE_PROVIDER_SITE_OTHER): Payer: Medicare Other

## 2011-07-06 DIAGNOSIS — Z79899 Other long term (current) drug therapy: Secondary | ICD-10-CM

## 2011-07-06 DIAGNOSIS — N058 Unspecified nephritic syndrome with other morphologic changes: Secondary | ICD-10-CM

## 2011-07-06 DIAGNOSIS — E785 Hyperlipidemia, unspecified: Secondary | ICD-10-CM

## 2011-07-06 DIAGNOSIS — E291 Testicular hypofunction: Secondary | ICD-10-CM

## 2011-07-06 DIAGNOSIS — I2510 Atherosclerotic heart disease of native coronary artery without angina pectoris: Secondary | ICD-10-CM

## 2011-07-06 DIAGNOSIS — M545 Low back pain: Secondary | ICD-10-CM

## 2011-07-06 DIAGNOSIS — N259 Disorder resulting from impaired renal tubular function, unspecified: Secondary | ICD-10-CM

## 2011-07-06 DIAGNOSIS — E1129 Type 2 diabetes mellitus with other diabetic kidney complication: Secondary | ICD-10-CM

## 2011-07-06 LAB — BASIC METABOLIC PANEL
CO2: 28 mEq/L (ref 19–32)
Calcium: 9.5 mg/dL (ref 8.4–10.5)
Creatinine, Ser: 1.3 mg/dL (ref 0.4–1.5)
GFR: 57.31 mL/min — ABNORMAL LOW (ref >60.00)
Sodium: 139 mEq/L (ref 135–145)

## 2011-07-06 LAB — HEMOGLOBIN A1C: Hgb A1c MFr Bld: 6.4 % (ref 4.6–6.5)

## 2011-07-07 ENCOUNTER — Other Ambulatory Visit: Payer: Self-pay | Admitting: Internal Medicine

## 2011-07-08 ENCOUNTER — Ambulatory Visit (INDEPENDENT_AMBULATORY_CARE_PROVIDER_SITE_OTHER): Payer: Medicare Other | Admitting: Internal Medicine

## 2011-07-08 ENCOUNTER — Encounter: Payer: Self-pay | Admitting: Internal Medicine

## 2011-07-08 VITALS — BP 140/78 | HR 84 | Temp 97.6°F | Resp 16 | Wt 208.0 lb

## 2011-07-08 DIAGNOSIS — R5381 Other malaise: Secondary | ICD-10-CM

## 2011-07-08 DIAGNOSIS — M545 Low back pain: Secondary | ICD-10-CM

## 2011-07-08 DIAGNOSIS — I10 Essential (primary) hypertension: Secondary | ICD-10-CM

## 2011-07-08 DIAGNOSIS — E1129 Type 2 diabetes mellitus with other diabetic kidney complication: Secondary | ICD-10-CM

## 2011-07-08 DIAGNOSIS — N058 Unspecified nephritic syndrome with other morphologic changes: Secondary | ICD-10-CM

## 2011-07-08 DIAGNOSIS — I2510 Atherosclerotic heart disease of native coronary artery without angina pectoris: Secondary | ICD-10-CM

## 2011-07-08 DIAGNOSIS — N259 Disorder resulting from impaired renal tubular function, unspecified: Secondary | ICD-10-CM

## 2011-07-08 DIAGNOSIS — R5383 Other fatigue: Secondary | ICD-10-CM

## 2011-07-08 DIAGNOSIS — M1990 Unspecified osteoarthritis, unspecified site: Secondary | ICD-10-CM

## 2011-07-08 NOTE — Assessment & Plan Note (Signed)
Continue with current prescription therapy as reflected on the Med list.  

## 2011-07-08 NOTE — Assessment & Plan Note (Signed)
Better  

## 2011-07-08 NOTE — Progress Notes (Signed)
   Subjective:    Patient ID: Jeff Burgess, male    DOB: October 18, 1935, 76 y.o.   MRN: [id number]  HPI  The patient presents for a follow-up of  chronic hypertension, chronic dyslipidemia, type 2 diabetes controlled with medicines. C/o fatigue  Wt Readings from Last 3 Encounters:  07/08/11 208 lb (94.348 kg)  03/10/11 219 lb (99.338 kg)  11/10/10 214 lb (97.07 kg)    BP Readings from Last 3 Encounters:  07/08/11 140/78  03/10/11 148/80  11/10/10 140/82     Review of Systems  Constitutional: Negative for appetite change and unexpected weight change.  HENT: Negative for nosebleeds, congestion, sore throat, sneezing, trouble swallowing and neck pain.   Eyes: Negative for itching and visual disturbance.  Respiratory: Negative for cough.   Cardiovascular: Negative for chest pain, palpitations and leg swelling.  Gastrointestinal: Negative for nausea, diarrhea, blood in stool and abdominal distention.  Genitourinary: Negative for frequency and hematuria.  Musculoskeletal: Negative for back pain, joint swelling and gait problem.  Skin: Negative for rash.  Neurological: Negative for dizziness, tremors, speech difficulty and weakness.  Psychiatric/Behavioral: Negative for disturbed wake/sleep cycle, dysphoric mood and agitation. The patient is not nervous/anxious.        Objective:   Physical Exam  Constitutional: He is oriented to person, place, and time. He appears well-developed.       obese  HENT:  Mouth/Throat: Oropharynx is clear and moist.  Eyes: Conjunctivae are normal. Pupils are equal, round, and reactive to light.  Neck: Normal range of motion. No JVD present. No thyromegaly present.  Cardiovascular: Normal rate, regular rhythm, normal heart sounds and intact distal pulses.  Exam reveals no gallop and no friction rub.   No murmur heard. Pulmonary/Chest: Effort normal and breath sounds normal. No respiratory distress. He has no wheezes. He has no rales. He exhibits no  tenderness.  Abdominal: Soft. Bowel sounds are normal. He exhibits no distension and no mass. There is no tenderness. There is no rebound and no guarding.  Musculoskeletal: Normal range of motion. He exhibits no edema and no tenderness.  Lymphadenopathy:    He has no cervical adenopathy.  Neurological: He is alert and oriented to person, place, and time. He has normal reflexes. No cranial nerve deficit. He exhibits normal muscle tone. Coordination normal.  Skin: Skin is warm and dry. No rash noted.  Psychiatric: He has a normal mood and affect. His behavior is normal. Judgment and thought content normal.   Lab Results  Component Value Date   WBC 8.0 10/09/2009   HGB 14.2 10/09/2009   HCT 40.3 10/09/2009   PLT 196.0 10/09/2009   GLUCOSE 125* 07/06/2011   CHOL 112 07/03/2010   TRIG 190.0* 07/03/2010   HDL 28.80* 07/03/2010   LDLCALC 45 07/03/2010   ALT 21 10/28/2010   AST 20 10/28/2010   NA 139 07/06/2011   K 5.0 07/06/2011   CL 103 07/06/2011   CREATININE 1.3 07/06/2011   BUN 23 07/06/2011   CO2 28 07/06/2011   TSH 2.11 03/08/2011   PSA 1.38 10/09/2009   HGBA1C 6.4 07/06/2011          Assessment & Plan:

## 2011-07-08 NOTE — Assessment & Plan Note (Signed)
Monitoring labs 

## 2011-09-30 ENCOUNTER — Encounter: Payer: Self-pay | Admitting: Gastroenterology

## 2011-10-30 ENCOUNTER — Other Ambulatory Visit: Payer: Self-pay | Admitting: Internal Medicine

## 2011-11-02 ENCOUNTER — Other Ambulatory Visit (INDEPENDENT_AMBULATORY_CARE_PROVIDER_SITE_OTHER): Payer: Medicare Other

## 2011-11-02 ENCOUNTER — Other Ambulatory Visit: Payer: Self-pay | Admitting: *Deleted

## 2011-11-02 DIAGNOSIS — N259 Disorder resulting from impaired renal tubular function, unspecified: Secondary | ICD-10-CM

## 2011-11-02 DIAGNOSIS — E1129 Type 2 diabetes mellitus with other diabetic kidney complication: Secondary | ICD-10-CM

## 2011-11-02 DIAGNOSIS — I10 Essential (primary) hypertension: Secondary | ICD-10-CM

## 2011-11-02 DIAGNOSIS — Z79899 Other long term (current) drug therapy: Secondary | ICD-10-CM

## 2011-11-02 DIAGNOSIS — E785 Hyperlipidemia, unspecified: Secondary | ICD-10-CM

## 2011-11-02 DIAGNOSIS — N058 Unspecified nephritic syndrome with other morphologic changes: Secondary | ICD-10-CM

## 2011-11-02 LAB — BASIC METABOLIC PANEL
BUN: 18 mg/dL (ref 6–23)
Chloride: 100 mEq/L (ref 96–112)
Glucose, Bld: 148 mg/dL — ABNORMAL HIGH (ref 70–99)
Potassium: 4.5 mEq/L (ref 3.5–5.1)

## 2011-11-11 ENCOUNTER — Ambulatory Visit (INDEPENDENT_AMBULATORY_CARE_PROVIDER_SITE_OTHER): Payer: Medicare Other | Admitting: Internal Medicine

## 2011-11-11 ENCOUNTER — Encounter: Payer: Self-pay | Admitting: Internal Medicine

## 2011-11-11 VITALS — BP 150/70 | HR 80 | Temp 96.9°F | Resp 16 | Ht 69.0 in | Wt 213.0 lb

## 2011-11-11 DIAGNOSIS — Z0000 Encounter for general adult medical examination without abnormal findings: Secondary | ICD-10-CM | POA: Insufficient documentation

## 2011-11-11 DIAGNOSIS — Z23 Encounter for immunization: Secondary | ICD-10-CM

## 2011-11-11 DIAGNOSIS — Z1211 Encounter for screening for malignant neoplasm of colon: Secondary | ICD-10-CM

## 2011-11-11 DIAGNOSIS — E1129 Type 2 diabetes mellitus with other diabetic kidney complication: Secondary | ICD-10-CM

## 2011-11-11 DIAGNOSIS — N058 Unspecified nephritic syndrome with other morphologic changes: Secondary | ICD-10-CM

## 2011-11-11 NOTE — Assessment & Plan Note (Signed)
Continue with current prescription therapy as reflected on the Med list.  

## 2011-11-11 NOTE — Progress Notes (Signed)
   Subjective:    Patient ID: Jeff Burgess, male    DOB: [birth year], 76 y.o.   MRN: [id number]  HPI  The patient is here for a wellness exam. The patient has been doing well overall without major physical or psychological issues going on lately.  The patient presents for a follow-up of  chronic hypertension, chronic dyslipidemia, type 2 diabetes controlled with medicines.  Wt Readings from Last 3 Encounters:  11/11/11 213 lb (96.616 kg)  07/08/11 208 lb (94.348 kg)  03/10/11 219 lb (99.338 kg)    BP Readings from Last 3 Encounters:  11/11/11 150/70  07/08/11 140/78  03/10/11 148/80     Review of Systems  Constitutional: Negative for appetite change and unexpected weight change.  HENT: Negative for nosebleeds, congestion, sore throat, sneezing, trouble swallowing and neck pain.   Eyes: Negative for itching and visual disturbance.  Respiratory: Negative for cough.   Cardiovascular: Negative for chest pain, palpitations and leg swelling.  Gastrointestinal: Negative for nausea, diarrhea, blood in stool and abdominal distention.  Genitourinary: Negative for frequency and hematuria.  Musculoskeletal: Negative for back pain, joint swelling and gait problem.  Skin: Negative for rash.  Neurological: Negative for dizziness, tremors, speech difficulty and weakness.  Psychiatric/Behavioral: Negative for disturbed wake/sleep cycle, dysphoric mood and agitation. The patient is not nervous/anxious.        Objective:   Physical Exam  Constitutional: He is oriented to person, place, and time. He appears well-developed.       obese  HENT:  Mouth/Throat: Oropharynx is clear and moist.  Eyes: Conjunctivae normal are normal. Pupils are equal, round, and reactive to light.  Neck: Normal range of motion. No JVD present. No thyromegaly present.  Cardiovascular: Normal rate, regular rhythm, normal heart sounds and intact distal pulses.  Exam reveals no gallop and no friction rub.   No murmur  heard. Pulmonary/Chest: Effort normal and breath sounds normal. No respiratory distress. He has no wheezes. He has no rales. He exhibits no tenderness.  Abdominal: Soft. Bowel sounds are normal. He exhibits no distension and no mass. There is no tenderness. There is no rebound and no guarding.  Musculoskeletal: Normal range of motion. He exhibits no edema and no tenderness.  Lymphadenopathy:    He has no cervical adenopathy.  Neurological: He is alert and oriented to person, place, and time. He has normal reflexes. No cranial nerve deficit. He exhibits normal muscle tone. Coordination normal.  Skin: Skin is warm and dry. No rash noted.  Psychiatric: He has a normal mood and affect. His behavior is normal. Judgment and thought content normal.   Lab Results  Component Value Date   WBC 8.0 10/09/2009   HGB 14.2 10/09/2009   HCT 40.3 10/09/2009   PLT 196.0 10/09/2009   GLUCOSE 148* 11/02/2011   CHOL 130 11/02/2011   TRIG 205.0* 11/02/2011   HDL 30.40* 11/02/2011   LDLDIRECT 78.4 11/02/2011   LDLCALC 45 07/03/2010   ALT 21 10/28/2010   AST 20 10/28/2010   NA 137 11/02/2011   K 4.5 11/02/2011   CL 100 11/02/2011   CREATININE 1.2 11/02/2011   BUN 18 11/02/2011   CO2 31 11/02/2011   TSH 2.11 03/08/2011   PSA 1.38 10/09/2009   HGBA1C 6.5 11/02/2011          Assessment & Plan:

## 2011-11-11 NOTE — Assessment & Plan Note (Signed)
  There is no risks for hepatitis, STDs or HIV. There is no history of blood transfusion. They have no travel history to infectious disease endemic areas of the world.  The patient has  seen their dentist in the last 12 month. They have  seen their eye doctor in the last year. They deny  Any major hearing difficulty and have not had audiologic testing in the last year.  They do not  have excessive sun exposure. Discussed the need for sun protection: hats, long sleeves and use of sunscreen if there is significant sun exposure.   Diet: the importance of a healthy diet is discussed. They do have a reasonably healthy  diet.  The patient has a fairly regular exercise program of a mixed nature: walking, yard work, etc.The benefits of regular aerobic exercise were discussed.  Depression screen: there are no signs or vegative symptoms of depression- irritability, change in appetite, anhedonia, sadness/tearfullness.  Cognitive assessment: the patient manages all their financial and personal affairs and is actively engaged. They could relate day,date,year and events; recalled 3/3 objects at 3 minutes  The following portions of the patient's history were reviewed and updated as appropriate: allergies, current medications, past family history, past medical history,  past surgical history, past social history  and problem list.  Vision, hearing, body mass index were assessed and reviewed.   During the course of the visit the patient was educated and counseled about appropriate screening and preventive services including : fall prevention , diabetes screening, nutrition counseling, colorectal cancer screening, and recommended immunizations.

## 2012-01-18 ENCOUNTER — Ambulatory Visit (AMBULATORY_SURGERY_CENTER): Payer: Medicare Other

## 2012-01-18 VITALS — Ht 69.0 in | Wt 215.4 lb

## 2012-01-18 DIAGNOSIS — Z8719 Personal history of other diseases of the digestive system: Secondary | ICD-10-CM

## 2012-01-18 DIAGNOSIS — Z1211 Encounter for screening for malignant neoplasm of colon: Secondary | ICD-10-CM

## 2012-01-18 MED ORDER — NA SULFATE-K SULFATE-MG SULF 17.5-3.13-1.6 GM/177ML PO SOLN: 1.0000 | Freq: Once | ORAL | Status: DC

## 2012-01-19 ENCOUNTER — Encounter: Payer: Self-pay | Admitting: Gastroenterology

## 2012-01-26 ENCOUNTER — Other Ambulatory Visit: Payer: Self-pay | Admitting: *Deleted

## 2012-01-26 MED ORDER — SAXAGLIPTIN-METFORMIN ER 2.5-1000 MG PO TB24: 1.0000 | ORAL_TABLET | Freq: Two times a day (BID) | ORAL | Status: DC

## 2012-01-26 MED ORDER — AMLODIPINE BESYLATE 10 MG PO TABS: 10.0000 mg | ORAL_TABLET | Freq: Every day | ORAL | Status: DC

## 2012-02-01 ENCOUNTER — Ambulatory Visit (AMBULATORY_SURGERY_CENTER): Payer: Medicare Other | Admitting: Gastroenterology

## 2012-02-01 ENCOUNTER — Encounter: Payer: Self-pay | Admitting: Gastroenterology

## 2012-02-01 VITALS — BP 134/79 | HR 68 | Temp 96.9°F | Resp 12 | Ht 69.0 in | Wt 215.0 lb

## 2012-02-01 DIAGNOSIS — K5730 Diverticulosis of large intestine without perforation or abscess without bleeding: Secondary | ICD-10-CM

## 2012-02-01 DIAGNOSIS — Z1211 Encounter for screening for malignant neoplasm of colon: Secondary | ICD-10-CM

## 2012-02-01 HISTORY — PX: COLONOSCOPY WITH PROPOFOL: SHX5780

## 2012-02-01 MED ORDER — SODIUM CHLORIDE 0.9 % IV SOLN: 500.0000 mL | INTRAVENOUS | Status: DC

## 2012-02-01 NOTE — Progress Notes (Signed)
Called to room to assist during endoscopic procedure.  Patient ID and intended procedure confirmed with present staff. Received instructions for my participation in the procedure from the performing physician.  

## 2012-02-01 NOTE — Progress Notes (Signed)
Patient did not experience any of the following events: a burn prior to discharge; a fall within the facility; wrong site/side/patient/procedure/implant event; or a hospital transfer or hospital admission upon discharge from the facility. (G8907) Patient did not have preoperative order for IV antibiotic SSI prophylaxis. (G8918)  

## 2012-02-01 NOTE — Patient Instructions (Signed)
YOU HAD AN ENDOSCOPIC PROCEDURE TODAY AT THE [facility]: Refer to the procedure report that was given to you for any specific questions about what was found during the examination.  If the procedure report does not answer your questions, please call your gastroenterologist to clarify.  If you requested that your care partner not be given the details of your procedure findings, then the procedure report has been included in a sealed envelope for you to review at your convenience later.  YOU SHOULD EXPECT: Some feelings of bloating in the abdomen. Passage of more gas than usual.  Walking can help get rid of the air that was put into your GI tract during the procedure and reduce the bloating. If you had a lower endoscopy (such as a colonoscopy or flexible sigmoidoscopy) you may notice spotting of blood in your stool or on the toilet paper. If you underwent a bowel prep for your procedure, then you may not have a normal bowel movement for a few days.  DIET: Your first meal following the procedure should be a light meal and then it is ok to progress to your normal diet.  A half-sandwich or bowl of soup is an example of a good first meal.  Heavy or fried foods are harder to digest and may make you feel nauseous or bloated.  Likewise meals heavy in dairy and vegetables can cause extra gas to form and this can also increase the bloating.  Drink plenty of fluids but you should avoid alcoholic beverages for 24 hours.  ACTIVITY: Your care partner should take you home directly after the procedure.  You should plan to take it easy, moving slowly for the rest of the day.  You can resume normal activity the day after the procedure however you should NOT DRIVE or use heavy machinery for 24 hours (because of the sedation medicines used during the test).    SYMPTOMS TO REPORT IMMEDIATELY: A gastroenterologist can be reached at any hour.  During normal business hours, 8:30 AM to 5:00 PM Monday through Friday,  call [phone].  After hours and on weekends, please call the GI answering service at [phone] who will take a message and have the physician on call contact you.   Following lower endoscopy (colonoscopy or flexible sigmoidoscopy):  Excessive amounts of blood in the stool  Significant tenderness or worsening of abdominal pains  Swelling of the abdomen that is new, acute  Fever of 100F or higher    FOLLOW UP: If any biopsies were taken you will be contacted by phone or by letter within the next 1-3 weeks.  Call your gastroenterologist if you have not heard about the biopsies in 3 weeks.  Our staff will call the home number listed on your records the next business day following your procedure to check on you and address any questions or concerns that you may have at that time regarding the information given to you following your procedure. This is a courtesy call and so if there is no answer at the home number and we have not heard from you through the emergency physician on call, we will assume that you have returned to your regular daily activities without incident.  SIGNATURES/CONFIDENTIALITY: You and/or your care partner have signed paperwork which will be entered into your electronic medical record.  These signatures attest to the fact that that the information above on your After Visit Summary has been reviewed and is understood.  Full responsibility of the confidentiality   of this discharge information lies with you and/or your care-partner.     

## 2012-02-01 NOTE — Op Note (Signed)
[facility] [address] [place], [address]   COLONOSCOPY PROCEDURE REPORT  PATIENT: Jeff, Burgess  MR#: [id number] BIRTHDATE: 1936/01/04 , 76  yrs. old GENDER: Male ENDOSCOPIST: Louis Meckel, MD REFERRED BY: PROCEDURE DATE:  02/01/2012 PROCEDURE:   Colonoscopy, diagnostic ASA CLASS:   Class II INDICATIONS: MEDICATIONS: MAC sedation, administered by CRNA and propofol (Diprivan) 200mg  IV  DESCRIPTION OF PROCEDURE:   After the risks benefits and alternatives of the procedure were thoroughly explained, informed consent was obtained.  A digital rectal exam revealed no abnormalities of the rectum.   The LB [id number]-[id number]  endoscope was introduced through the anus and advanced to the cecum, which was identified by both the appendix and ileocecal valve. No adverse events experienced.   The quality of the prep was Suprep good  The instrument was then slowly withdrawn as the colon was fully examined.      COLON FINDINGS: Moderate diverticulosis was noted in the sigmoid colon.   The colon mucosa was otherwise normal.  Retroflexed views revealed no abnormalities. The time to cecum=1 minutes 35 seconds. Withdrawal time=11 minutes 22 seconds.  The scope was withdrawn and the procedure completed. COMPLICATIONS: There were no complications.  ENDOSCOPIC IMPRESSION: 1.   Moderate diverticulosis was noted in the sigmoid colon 2.   The colon mucosa was otherwise normal  RECOMMENDATIONS: Continue current colorectal screening recommendations for "routine risk" patients with a repeat colonoscopy in 10 years.   eSigned:  Louis Meckel, MD 02/01/2012 11:42 AM   cc: Linda Hedges.  Plotnikov, MD

## 2012-02-02 ENCOUNTER — Telehealth: Payer: Self-pay | Admitting: *Deleted

## 2012-02-02 NOTE — Telephone Encounter (Signed)
  Follow up Call-  Call back number 02/01/2012  Post procedure Call Back phone  # [phone] hm  Permission to leave phone message Yes     Patient questions:  Do you have a fever, pain , or abdominal swelling? no Pain Score  0 *  Have you tolerated food without any problems? yes  Have you been able to return to your normal activities? yes  Do you have any questions about your discharge instructions: Diet   no Medications  no Follow up visit  no  Do you have questions or concerns about your Care? no  Actions: * If pain score is 4 or above: No action needed, pain <4.  Spoke with patient's wife, he's doing fine, he is out getting breakfast.

## 2012-03-10 ENCOUNTER — Ambulatory Visit: Payer: Medicare Other | Admitting: Internal Medicine

## 2012-03-16 ENCOUNTER — Telehealth: Payer: Self-pay | Admitting: Internal Medicine

## 2012-03-16 NOTE — Telephone Encounter (Signed)
Pls write a letter - need to know his juror #, date, court address Thx

## 2012-03-16 NOTE — Telephone Encounter (Signed)
Jeff Burgess has jury duty coming up on March 24th.  He feels that due to his hearing impairment he would not be able to make a fare judgement in the case.  He would like a letter stating his case and mailed to his home address.  He has to have the letter turned in by the 14th.

## 2012-03-21 NOTE — Telephone Encounter (Signed)
Letter mailed to pts home. 

## 2012-03-21 NOTE — Telephone Encounter (Signed)
Called pt- spoke to a woman who states the letter doesn't have a juror #.

## 2012-04-03 ENCOUNTER — Other Ambulatory Visit: Payer: Self-pay | Admitting: *Deleted

## 2012-04-03 MED ORDER — LOVASTATIN 40 MG PO TABS: 40.0000 mg | ORAL_TABLET | Freq: Every day | ORAL | Status: DC

## 2012-04-05 ENCOUNTER — Other Ambulatory Visit (INDEPENDENT_AMBULATORY_CARE_PROVIDER_SITE_OTHER): Payer: Medicare Other

## 2012-04-05 DIAGNOSIS — Z0000 Encounter for general adult medical examination without abnormal findings: Secondary | ICD-10-CM

## 2012-04-05 DIAGNOSIS — Z23 Encounter for immunization: Secondary | ICD-10-CM

## 2012-04-05 DIAGNOSIS — Z1211 Encounter for screening for malignant neoplasm of colon: Secondary | ICD-10-CM

## 2012-04-05 DIAGNOSIS — N058 Unspecified nephritic syndrome with other morphologic changes: Secondary | ICD-10-CM

## 2012-04-05 DIAGNOSIS — E1129 Type 2 diabetes mellitus with other diabetic kidney complication: Secondary | ICD-10-CM

## 2012-04-05 LAB — BASIC METABOLIC PANEL
BUN: 19 mg/dL (ref 6–23)
CO2: 29 mEq/L (ref 19–32)
Calcium: 9.6 mg/dL (ref 8.4–10.5)
GFR: 53.38 mL/min — ABNORMAL LOW (ref >60.00)
Glucose, Bld: 157 mg/dL — ABNORMAL HIGH (ref 70–99)

## 2012-04-07 ENCOUNTER — Ambulatory Visit (INDEPENDENT_AMBULATORY_CARE_PROVIDER_SITE_OTHER): Payer: Medicare Other | Admitting: Internal Medicine

## 2012-04-07 ENCOUNTER — Encounter: Payer: Self-pay | Admitting: Internal Medicine

## 2012-04-07 VITALS — BP 150/62 | HR 68 | Temp 96.7°F | Resp 16 | Wt 210.0 lb

## 2012-04-07 DIAGNOSIS — L219 Seborrheic dermatitis, unspecified: Secondary | ICD-10-CM

## 2012-04-07 DIAGNOSIS — I2510 Atherosclerotic heart disease of native coronary artery without angina pectoris: Secondary | ICD-10-CM

## 2012-04-07 DIAGNOSIS — N058 Unspecified nephritic syndrome with other morphologic changes: Secondary | ICD-10-CM

## 2012-04-07 DIAGNOSIS — M545 Low back pain: Secondary | ICD-10-CM

## 2012-04-07 DIAGNOSIS — I10 Essential (primary) hypertension: Secondary | ICD-10-CM

## 2012-04-07 DIAGNOSIS — N320 Bladder-neck obstruction: Secondary | ICD-10-CM

## 2012-04-07 DIAGNOSIS — E785 Hyperlipidemia, unspecified: Secondary | ICD-10-CM

## 2012-04-07 DIAGNOSIS — E1129 Type 2 diabetes mellitus with other diabetic kidney complication: Secondary | ICD-10-CM

## 2012-04-07 DIAGNOSIS — N259 Disorder resulting from impaired renal tubular function, unspecified: Secondary | ICD-10-CM

## 2012-04-07 NOTE — Assessment & Plan Note (Signed)
Continue with current prescription therapy as reflected on the Med list.  

## 2012-04-07 NOTE — Progress Notes (Signed)
   Subjective:    HPI    The patient presents for a follow-up of  chronic hypertension, chronic dyslipidemia, type 2 diabetes controlled with medicines.  Wt Readings from Last 3 Encounters:  04/07/12 210 lb (95.255 kg)  02/01/12 215 lb (97.523 kg)  01/18/12 215 lb 6.4 oz (97.705 kg)    BP Readings from Last 3 Encounters:  04/07/12 150/62  02/01/12 134/79  11/11/11 150/70     Review of Systems  Constitutional: Negative for appetite change and unexpected weight change.  HENT: Negative for nosebleeds, congestion, sore throat, sneezing, trouble swallowing and neck pain.   Eyes: Negative for itching and visual disturbance.  Respiratory: Negative for cough.   Cardiovascular: Negative for chest pain, palpitations and leg swelling.  Gastrointestinal: Negative for nausea, diarrhea, blood in stool and abdominal distention.  Genitourinary: Negative for frequency and hematuria.  Musculoskeletal: Negative for back pain, joint swelling and gait problem.  Skin: Negative for rash.  Neurological: Negative for dizziness, tremors, speech difficulty and weakness.  Psychiatric/Behavioral: Negative for sleep disturbance, dysphoric mood and agitation. The patient is not nervous/anxious.        Objective:   Physical Exam  Constitutional: He is oriented to person, place, and time. He appears well-developed.  obese  HENT:  Mouth/Throat: Oropharynx is clear and moist.  Eyes: Conjunctivae are normal. Pupils are equal, round, and reactive to light.  Neck: Normal range of motion. No JVD present. No thyromegaly present.  Cardiovascular: Normal rate, regular rhythm, normal heart sounds and intact distal pulses.  Exam reveals no gallop and no friction rub.   No murmur heard. Pulmonary/Chest: Effort normal and breath sounds normal. No respiratory distress. He has no wheezes. He has no rales. He exhibits no tenderness.  Abdominal: Soft. Bowel sounds are normal. He exhibits no distension and no mass.  There is no tenderness. There is no rebound and no guarding.  Musculoskeletal: Normal range of motion. He exhibits no edema and no tenderness.  Lymphadenopathy:    He has no cervical adenopathy.  Neurological: He is alert and oriented to person, place, and time. He has normal reflexes. No cranial nerve deficit. He exhibits normal muscle tone. Coordination normal.  Skin: Skin is warm and dry. No rash noted.  Psychiatric: He has a normal mood and affect. His behavior is normal. Judgment and thought content normal.   Lab Results  Component Value Date   WBC 8.0 10/09/2009   HGB 14.2 10/09/2009   HCT 40.3 10/09/2009   PLT 196.0 10/09/2009   GLUCOSE 157* 04/05/2012   CHOL 130 11/02/2011   TRIG 205.0* 11/02/2011   HDL 30.40* 11/02/2011   LDLDIRECT 78.4 11/02/2011   LDLCALC 45 07/03/2010   ALT 21 10/28/2010   AST 20 10/28/2010   NA 140 04/05/2012   K 4.7 04/05/2012   CL 104 04/05/2012   CREATININE 1.4 04/05/2012   BUN 19 04/05/2012   CO2 29 04/05/2012   TSH 2.11 03/08/2011   PSA 1.38 10/09/2009   HGBA1C 6.6* 04/05/2012          Assessment & Plan:

## 2012-04-07 NOTE — Assessment & Plan Note (Signed)
Cream prn

## 2012-04-07 NOTE — Assessment & Plan Note (Signed)
Stable

## 2012-04-09 NOTE — Assessment & Plan Note (Signed)
Discussed - cont Rx RTC 4 mo

## 2012-05-19 ENCOUNTER — Other Ambulatory Visit: Payer: Self-pay | Admitting: Internal Medicine

## 2012-08-11 ENCOUNTER — Ambulatory Visit: Payer: Medicare Other | Admitting: Internal Medicine

## 2012-10-03 ENCOUNTER — Other Ambulatory Visit (INDEPENDENT_AMBULATORY_CARE_PROVIDER_SITE_OTHER): Payer: Medicare Other

## 2012-10-03 DIAGNOSIS — L219 Seborrheic dermatitis, unspecified: Secondary | ICD-10-CM

## 2012-10-03 DIAGNOSIS — N259 Disorder resulting from impaired renal tubular function, unspecified: Secondary | ICD-10-CM

## 2012-10-03 DIAGNOSIS — N320 Bladder-neck obstruction: Secondary | ICD-10-CM

## 2012-10-03 DIAGNOSIS — M545 Low back pain: Secondary | ICD-10-CM

## 2012-10-03 DIAGNOSIS — I10 Essential (primary) hypertension: Secondary | ICD-10-CM

## 2012-10-03 DIAGNOSIS — E785 Hyperlipidemia, unspecified: Secondary | ICD-10-CM

## 2012-10-03 DIAGNOSIS — I2510 Atherosclerotic heart disease of native coronary artery without angina pectoris: Secondary | ICD-10-CM

## 2012-10-03 LAB — BASIC METABOLIC PANEL
BUN: 22 mg/dL (ref 6–23)
Chloride: 102 mEq/L (ref 96–112)
Creatinine, Ser: 1.4 mg/dL (ref 0.4–1.5)
GFR: 54.22 mL/min — ABNORMAL LOW (ref >60.00)

## 2012-10-03 LAB — URINALYSIS
Bilirubin Urine: NEGATIVE
Nitrite: NEGATIVE
Specific Gravity, Urine: 1.025 (ref 1.000–1.030)
Total Protein, Urine: NEGATIVE
Urine Glucose: NEGATIVE
pH: 6 (ref 5.0–8.0)

## 2012-10-03 LAB — HEPATIC FUNCTION PANEL
ALT: 19 U/L (ref 0–53)
Alkaline Phosphatase: 59 U/L (ref 39–117)
Bilirubin, Direct: 0.1 mg/dL (ref 0.0–0.3)
Total Bilirubin: 0.7 mg/dL (ref 0.3–1.2)

## 2012-10-03 LAB — CBC WITH DIFFERENTIAL/PLATELET
Basophils Absolute: 0.1 10*3/uL (ref 0.0–0.1)
Eosinophils Absolute: 0.2 10*3/uL (ref 0.0–0.7)
HCT: 46 % (ref 39.0–52.0)
Lymphs Abs: 1.7 10*3/uL (ref 0.7–4.0)
Monocytes Absolute: 0.8 10*3/uL (ref 0.1–1.0)
Monocytes Relative: 7.7 % (ref 3.0–12.0)
Platelets: 224 10*3/uL (ref 150.0–400.0)
RDW: 13 % (ref 11.5–14.6)

## 2012-10-03 LAB — PSA: PSA: 1.48 ng/mL (ref 0.10–4.00)

## 2012-10-03 LAB — HEMOGLOBIN A1C: Hgb A1c MFr Bld: 6.8 % — ABNORMAL HIGH (ref 4.6–6.5)

## 2012-10-03 LAB — LIPID PANEL: VLDL: 33.2 mg/dL (ref 0.0–40.0)

## 2012-10-05 ENCOUNTER — Ambulatory Visit (INDEPENDENT_AMBULATORY_CARE_PROVIDER_SITE_OTHER): Payer: Medicare Other | Admitting: Internal Medicine

## 2012-10-05 ENCOUNTER — Encounter: Payer: Self-pay | Admitting: Internal Medicine

## 2012-10-05 VITALS — BP 170/84 | HR 76 | Temp 96.7°F | Resp 16 | Wt 209.0 lb

## 2012-10-05 DIAGNOSIS — M545 Low back pain, unspecified: Secondary | ICD-10-CM

## 2012-10-05 DIAGNOSIS — I10 Essential (primary) hypertension: Secondary | ICD-10-CM

## 2012-10-05 DIAGNOSIS — Z23 Encounter for immunization: Secondary | ICD-10-CM

## 2012-10-05 DIAGNOSIS — E1129 Type 2 diabetes mellitus with other diabetic kidney complication: Secondary | ICD-10-CM

## 2012-10-05 DIAGNOSIS — N058 Unspecified nephritic syndrome with other morphologic changes: Secondary | ICD-10-CM

## 2012-10-05 DIAGNOSIS — I2510 Atherosclerotic heart disease of native coronary artery without angina pectoris: Secondary | ICD-10-CM

## 2012-10-05 NOTE — Assessment & Plan Note (Signed)
Continue with current prescription therapy as reflected on the Med list.  

## 2012-10-05 NOTE — Assessment & Plan Note (Signed)
Chronic SBP 120-130 at home Continue with current prescription therapy as reflected on the Med list.

## 2012-10-05 NOTE — Progress Notes (Signed)
   Subjective:    HPI    The patient presents for a follow-up of  chronic hypertension, chronic dyslipidemia, type 2 diabetes controlled with medicines. BP is ok at home  Wt Readings from Last 3 Encounters:  10/05/12 209 lb (94.802 kg)  04/07/12 210 lb (95.255 kg)  02/01/12 215 lb (97.523 kg)    BP Readings from Last 3 Encounters:  10/05/12 170/84  04/07/12 150/62  02/01/12 134/79     Review of Systems  Constitutional: Negative for appetite change and unexpected weight change.  HENT: Negative for nosebleeds, congestion, sore throat, sneezing, trouble swallowing and neck pain.   Eyes: Negative for itching and visual disturbance.  Respiratory: Negative for cough.   Cardiovascular: Negative for chest pain, palpitations and leg swelling.  Gastrointestinal: Negative for nausea, diarrhea, blood in stool and abdominal distention.  Genitourinary: Negative for frequency and hematuria.  Musculoskeletal: Negative for back pain, joint swelling and gait problem.  Skin: Negative for rash.  Neurological: Negative for dizziness, tremors, speech difficulty and weakness.  Psychiatric/Behavioral: Negative for sleep disturbance, dysphoric mood and agitation. The patient is not nervous/anxious.        Objective:   Physical Exam  Constitutional: He is oriented to person, place, and time. He appears well-developed.  obese  HENT:  Mouth/Throat: Oropharynx is clear and moist.  Eyes: Conjunctivae are normal. Pupils are equal, round, and reactive to light.  Neck: Normal range of motion. No JVD present. No thyromegaly present.  Cardiovascular: Normal rate, regular rhythm, normal heart sounds and intact distal pulses.  Exam reveals no gallop and no friction rub.   No murmur heard. Pulmonary/Chest: Effort normal and breath sounds normal. No respiratory distress. He has no wheezes. He has no rales. He exhibits no tenderness.  Abdominal: Soft. Bowel sounds are normal. He exhibits no distension and  no mass. There is no tenderness. There is no rebound and no guarding.  Musculoskeletal: Normal range of motion. He exhibits no edema and no tenderness.  Lymphadenopathy:    He has no cervical adenopathy.  Neurological: He is alert and oriented to person, place, and time. He has normal reflexes. No cranial nerve deficit. He exhibits normal muscle tone. Coordination normal.  Skin: Skin is warm and dry. No rash noted.  Psychiatric: He has a normal mood and affect. His behavior is normal. Judgment and thought content normal.   Lab Results  Component Value Date   WBC 10.3 10/03/2012   HGB 15.8 10/03/2012   HCT 46.0 10/03/2012   PLT 224.0 10/03/2012   GLUCOSE 156* 10/03/2012   CHOL 122 10/03/2012   TRIG 166.0* 10/03/2012   HDL 29.20* 10/03/2012   LDLDIRECT 78.4 11/02/2011   LDLCALC 60 10/03/2012   ALT 19 10/03/2012   AST 17 10/03/2012   NA 139 10/03/2012   K 4.5 10/03/2012   CL 102 10/03/2012   CREATININE 1.4 10/03/2012   BUN 22 10/03/2012   CO2 30 10/03/2012   TSH 1.53 10/03/2012   PSA 1.48 10/03/2012   HGBA1C 6.8* 10/03/2012          Assessment & Plan:

## 2013-01-01 ENCOUNTER — Other Ambulatory Visit: Payer: Self-pay | Admitting: Internal Medicine

## 2013-02-05 ENCOUNTER — Other Ambulatory Visit (INDEPENDENT_AMBULATORY_CARE_PROVIDER_SITE_OTHER): Payer: Medicare HMO

## 2013-02-05 DIAGNOSIS — Z23 Encounter for immunization: Secondary | ICD-10-CM

## 2013-02-05 DIAGNOSIS — M545 Low back pain, unspecified: Secondary | ICD-10-CM

## 2013-02-05 DIAGNOSIS — I10 Essential (primary) hypertension: Secondary | ICD-10-CM

## 2013-02-05 DIAGNOSIS — E1129 Type 2 diabetes mellitus with other diabetic kidney complication: Secondary | ICD-10-CM

## 2013-02-05 DIAGNOSIS — I2510 Atherosclerotic heart disease of native coronary artery without angina pectoris: Secondary | ICD-10-CM

## 2013-02-05 LAB — HEMOGLOBIN A1C: Hgb A1c MFr Bld: 6.9 % — ABNORMAL HIGH (ref 4.6–6.5)

## 2013-02-05 LAB — BASIC METABOLIC PANEL
BUN: 18 mg/dL (ref 6–23)
CALCIUM: 9.7 mg/dL (ref 8.4–10.5)
CO2: 27 meq/L (ref 19–32)
CREATININE: 1.4 mg/dL (ref 0.4–1.5)
Chloride: 104 mEq/L (ref 96–112)
GFR: 52.38 mL/min — AB (ref >60.00)
GLUCOSE: 148 mg/dL — AB (ref 70–99)
Potassium: 4.7 mEq/L (ref 3.5–5.1)
SODIUM: 139 meq/L (ref 135–145)

## 2013-02-06 ENCOUNTER — Ambulatory Visit (INDEPENDENT_AMBULATORY_CARE_PROVIDER_SITE_OTHER): Payer: Medicare HMO | Admitting: Internal Medicine

## 2013-02-06 ENCOUNTER — Encounter: Payer: Self-pay | Admitting: Internal Medicine

## 2013-02-06 VITALS — BP 132/70 | HR 80 | Temp 98.1°F | Resp 16 | Wt 212.0 lb

## 2013-02-06 DIAGNOSIS — E785 Hyperlipidemia, unspecified: Secondary | ICD-10-CM

## 2013-02-06 DIAGNOSIS — N259 Disorder resulting from impaired renal tubular function, unspecified: Secondary | ICD-10-CM

## 2013-02-06 DIAGNOSIS — I10 Essential (primary) hypertension: Secondary | ICD-10-CM

## 2013-02-06 DIAGNOSIS — I2510 Atherosclerotic heart disease of native coronary artery without angina pectoris: Secondary | ICD-10-CM

## 2013-02-06 DIAGNOSIS — L980 Pyogenic granuloma: Secondary | ICD-10-CM

## 2013-02-06 DIAGNOSIS — M545 Low back pain, unspecified: Secondary | ICD-10-CM

## 2013-02-06 MED ORDER — MUPIROCIN 2 % EX OINT: TOPICAL_OINTMENT | CUTANEOUS | Status: DC

## 2013-02-06 MED ORDER — DOXYCYCLINE HYCLATE 100 MG PO TABS: 100.0000 mg | ORAL_TABLET | Freq: Two times a day (BID) | ORAL | Status: DC

## 2013-02-06 NOTE — Assessment & Plan Note (Signed)
Continue with current prescription therapy as reflected on the Med list.  

## 2013-02-06 NOTE — Progress Notes (Signed)
   Subjective:    HPI    The patient presents for a follow-up of  chronic hypertension, chronic dyslipidemia, type 2 diabetes controlled with medicines. BP is ok at home C/o L 3d finger sore and a ridged deformity of the nail bed 3x4 mm x3 month  Wt Readings from Last 3 Encounters:  02/06/13 212 lb (96.163 kg)  10/05/12 209 lb (94.802 kg)  04/07/12 210 lb (95.255 kg)    BP Readings from Last 3 Encounters:  02/06/13 132/70  10/05/12 170/84  04/07/12 150/62     Review of Systems  Constitutional: Negative for appetite change and unexpected weight change.  HENT: Negative for congestion, nosebleeds, sneezing, sore throat and trouble swallowing.   Eyes: Negative for itching and visual disturbance.  Respiratory: Negative for cough.   Cardiovascular: Negative for chest pain, palpitations and leg swelling.  Gastrointestinal: Negative for nausea, diarrhea, blood in stool and abdominal distention.  Genitourinary: Negative for frequency and hematuria.  Musculoskeletal: Negative for back pain, gait problem, joint swelling and neck pain.  Skin: Negative for rash.  Neurological: Negative for dizziness, tremors, speech difficulty and weakness.  Psychiatric/Behavioral: Negative for sleep disturbance, dysphoric mood and agitation. The patient is not nervous/anxious.        Objective:   Physical Exam  Constitutional: He is oriented to person, place, and time. He appears well-developed.  obese  HENT:  Mouth/Throat: Oropharynx is clear and moist.  Eyes: Conjunctivae are normal. Pupils are equal, round, and reactive to light.  Neck: Normal range of motion. No JVD present. No thyromegaly present.  Cardiovascular: Normal rate, regular rhythm, normal heart sounds and intact distal pulses.  Exam reveals no gallop and no friction rub.   No murmur heard. Pulmonary/Chest: Effort normal and breath sounds normal. No respiratory distress. He has no wheezes. He has no rales. He exhibits no  tenderness.  Abdominal: Soft. Bowel sounds are normal. He exhibits no distension and no mass. There is no tenderness. There is no rebound and no guarding.  Musculoskeletal: Normal range of motion. He exhibits no edema and no tenderness.  Lymphadenopathy:    He has no cervical adenopathy.  Neurological: He is alert and oriented to person, place, and time. He has normal reflexes. No cranial nerve deficit. He exhibits normal muscle tone. Coordination normal.  Skin: Skin is warm and dry. No rash noted.  Psychiatric: He has a normal mood and affect. His behavior is normal. Judgment and thought content normal.  L 3d finger infection granuloma and a ridged deformity of the nail bed 3x4 mm   Lab Results  Component Value Date   WBC 10.3 10/03/2012   HGB 15.8 10/03/2012   HCT 46.0 10/03/2012   PLT 224.0 10/03/2012   GLUCOSE 148* 02/05/2013   CHOL 122 10/03/2012   TRIG 166.0* 10/03/2012   HDL 29.20* 10/03/2012   LDLDIRECT 78.4 11/02/2011   LDLCALC 60 10/03/2012   ALT 19 10/03/2012   AST 17 10/03/2012   NA 139 02/05/2013   K 4.7 02/05/2013   CL 104 02/05/2013   CREATININE 1.4 02/05/2013   BUN 18 02/05/2013   CO2 27 02/05/2013   TSH 1.53 10/03/2012   PSA 1.48 10/03/2012   HGBA1C 6.9* 02/05/2013          Assessment & Plan:

## 2013-02-06 NOTE — Assessment & Plan Note (Signed)
L 3d finger infection granuloma and a ridged deformity of the nail bed 3 mm RTC 2 wks PO abx, Mupirocin

## 2013-02-06 NOTE — Progress Notes (Signed)
Pre visit review using our clinic review tool, if applicable. No additional management support is needed unless otherwise documented below in the visit note. 

## 2013-02-27 ENCOUNTER — Ambulatory Visit: Payer: Medicare HMO | Admitting: Internal Medicine

## 2013-03-01 ENCOUNTER — Other Ambulatory Visit: Payer: Self-pay | Admitting: Internal Medicine

## 2013-04-04 ENCOUNTER — Other Ambulatory Visit (INDEPENDENT_AMBULATORY_CARE_PROVIDER_SITE_OTHER): Payer: Medicare HMO

## 2013-04-04 ENCOUNTER — Ambulatory Visit (INDEPENDENT_AMBULATORY_CARE_PROVIDER_SITE_OTHER): Payer: Medicare HMO | Admitting: Internal Medicine

## 2013-04-04 ENCOUNTER — Encounter: Payer: Self-pay | Admitting: Internal Medicine

## 2013-04-04 VITALS — BP 138/60 | HR 80 | Temp 98.3°F | Resp 16 | Wt 212.0 lb

## 2013-04-04 DIAGNOSIS — E1129 Type 2 diabetes mellitus with other diabetic kidney complication: Secondary | ICD-10-CM

## 2013-04-04 DIAGNOSIS — Z23 Encounter for immunization: Secondary | ICD-10-CM

## 2013-04-04 DIAGNOSIS — N259 Disorder resulting from impaired renal tubular function, unspecified: Secondary | ICD-10-CM

## 2013-04-04 DIAGNOSIS — L980 Pyogenic granuloma: Secondary | ICD-10-CM

## 2013-04-04 DIAGNOSIS — I10 Essential (primary) hypertension: Secondary | ICD-10-CM

## 2013-04-04 DIAGNOSIS — M545 Low back pain, unspecified: Secondary | ICD-10-CM

## 2013-04-04 DIAGNOSIS — R5383 Other fatigue: Secondary | ICD-10-CM

## 2013-04-04 DIAGNOSIS — R5381 Other malaise: Secondary | ICD-10-CM

## 2013-04-04 LAB — BASIC METABOLIC PANEL
BUN: 16 mg/dL (ref 6–23)
CALCIUM: 10 mg/dL (ref 8.4–10.5)
CO2: 30 mEq/L (ref 19–32)
CREATININE: 1.4 mg/dL (ref 0.4–1.5)
Chloride: 101 mEq/L (ref 96–112)
GFR: 51.93 mL/min — AB (ref >60.00)
GLUCOSE: 119 mg/dL — AB (ref 70–99)
Potassium: 4.7 mEq/L (ref 3.5–5.1)
Sodium: 139 mEq/L (ref 135–145)

## 2013-04-04 LAB — HEMOGLOBIN A1C: HEMOGLOBIN A1C: 6.8 % — AB (ref 4.6–6.5)

## 2013-04-04 MED ORDER — DOXYCYCLINE HYCLATE 100 MG PO TABS: 100.0000 mg | ORAL_TABLET | Freq: Two times a day (BID) | ORAL | Status: DC

## 2013-04-04 NOTE — Assessment & Plan Note (Signed)
Labs  Continue with current prescription therapy as reflected on the Med list.  

## 2013-04-04 NOTE — Assessment & Plan Note (Addendum)
No change - pt declined referral

## 2013-04-04 NOTE — Assessment & Plan Note (Signed)
Better  

## 2013-04-04 NOTE — Progress Notes (Signed)
   Subjective:    HPI    The patient presents for a follow-up of  chronic hypertension, chronic dyslipidemia, type 2 diabetes controlled with medicines. BP is ok at home F/u L 3d finger sore and a ridged deformity of the nail bed 3x4 mm x3 month  Wt Readings from Last 3 Encounters:  04/04/13 212 lb (96.163 kg)  02/06/13 212 lb (96.163 kg)  10/05/12 209 lb (94.802 kg)    BP Readings from Last 3 Encounters:  04/04/13 138/60  02/06/13 132/70  10/05/12 170/84     Review of Systems  Constitutional: Negative for appetite change and unexpected weight change.  HENT: Negative for congestion, nosebleeds, sneezing, sore throat and trouble swallowing.   Eyes: Negative for itching and visual disturbance.  Respiratory: Negative for cough.   Cardiovascular: Negative for chest pain, palpitations and leg swelling.  Gastrointestinal: Negative for nausea, diarrhea, blood in stool and abdominal distention.  Genitourinary: Negative for frequency and hematuria.  Musculoskeletal: Negative for back pain, gait problem, joint swelling and neck pain.  Skin: Negative for rash.  Neurological: Negative for dizziness, tremors, speech difficulty and weakness.  Psychiatric/Behavioral: Negative for sleep disturbance, dysphoric mood and agitation. The patient is not nervous/anxious.        Objective:   Physical Exam  Constitutional: He is oriented to person, place, and time. He appears well-developed.  obese  HENT:  Mouth/Throat: Oropharynx is clear and moist.  Eyes: Conjunctivae are normal. Pupils are equal, round, and reactive to light.  Neck: Normal range of motion. No JVD present. No thyromegaly present.  Cardiovascular: Normal rate, regular rhythm, normal heart sounds and intact distal pulses.  Exam reveals no gallop and no friction rub.   No murmur heard. Pulmonary/Chest: Effort normal and breath sounds normal. No respiratory distress. He has no wheezes. He has no rales. He exhibits no  tenderness.  Abdominal: Soft. Bowel sounds are normal. He exhibits no distension and no mass. There is no tenderness. There is no rebound and no guarding.  Musculoskeletal: Normal range of motion. He exhibits no edema and no tenderness.  Lymphadenopathy:    He has no cervical adenopathy.  Neurological: He is alert and oriented to person, place, and time. He has normal reflexes. No cranial nerve deficit. He exhibits normal muscle tone. Coordination normal.  Skin: Skin is warm and dry. No rash noted.  Psychiatric: He has a normal mood and affect. His behavior is normal. Judgment and thought content normal.  L 3d finger infection granuloma and a ridged deformity of the nail bed 3x4 mm - no change   Lab Results  Component Value Date   WBC 10.3 10/03/2012   HGB 15.8 10/03/2012   HCT 46.0 10/03/2012   PLT 224.0 10/03/2012   GLUCOSE 148* 02/05/2013   CHOL 122 10/03/2012   TRIG 166.0* 10/03/2012   HDL 29.20* 10/03/2012   LDLDIRECT 78.4 11/02/2011   LDLCALC 60 10/03/2012   ALT 19 10/03/2012   AST 17 10/03/2012   NA 139 02/05/2013   K 4.7 02/05/2013   CL 104 02/05/2013   CREATININE 1.4 02/05/2013   BUN 18 02/05/2013   CO2 27 02/05/2013   TSH 1.53 10/03/2012   PSA 1.48 10/03/2012   HGBA1C 6.9* 02/05/2013          Assessment & Plan:

## 2013-04-04 NOTE — Assessment & Plan Note (Signed)
Continue with current prescription therapy as reflected on the Med list.  

## 2013-04-04 NOTE — Assessment & Plan Note (Signed)
Continue with current prescription therapy as reflected on the Med list. BP Readings from Last 3 Encounters:  04/04/13 138/60  02/06/13 132/70  10/05/12 170/84

## 2013-04-04 NOTE — Progress Notes (Signed)
Pre visit review using our clinic review tool, if applicable. No additional management support is needed unless otherwise documented below in the visit note. 

## 2013-04-05 ENCOUNTER — Telehealth: Payer: Self-pay | Admitting: Internal Medicine

## 2013-04-05 NOTE — Telephone Encounter (Signed)
Relevant patient education assigned to patient using Emmi. ° °

## 2013-04-16 ENCOUNTER — Telehealth: Payer: Self-pay | Admitting: Internal Medicine

## 2013-04-16 NOTE — Telephone Encounter (Signed)
Patient called and says that his insurance is faxing a tier exception form for his Saxagliptin-Metformin (KOMBIGLYZE XR) 2.05-998 MG rx. They faxed one last week but have not received it back so they are faxing it again this morning. Patient says that he is almost out of medication and needs this completed so that he can get a refill at a price he is able to afford. Please advise.

## 2013-04-17 ENCOUNTER — Telehealth: Payer: Self-pay | Admitting: Internal Medicine

## 2013-04-17 NOTE — Telephone Encounter (Signed)
Patient states he has been in contact with [facility] about a tier exception on kombiglyze xr.  Patient is out of meds.  He would like to know if we have received the tier exception from [facility].  If not patient is wanting to know if he can be put on something that will be covered under his insurance.  Patient is also concerned about being out of meds.  He has been off his meds for two days.

## 2013-04-18 ENCOUNTER — Other Ambulatory Visit: Payer: Self-pay | Admitting: Internal Medicine

## 2013-04-24 ENCOUNTER — Encounter: Payer: Self-pay | Admitting: Internal Medicine

## 2013-04-24 ENCOUNTER — Ambulatory Visit (INDEPENDENT_AMBULATORY_CARE_PROVIDER_SITE_OTHER): Payer: Commercial Managed Care - HMO | Admitting: Internal Medicine

## 2013-04-24 VITALS — BP 160/80 | HR 80 | Temp 97.4°F | Resp 16 | Wt 213.0 lb

## 2013-04-24 DIAGNOSIS — E1129 Type 2 diabetes mellitus with other diabetic kidney complication: Secondary | ICD-10-CM

## 2013-04-24 DIAGNOSIS — N259 Disorder resulting from impaired renal tubular function, unspecified: Secondary | ICD-10-CM

## 2013-04-24 DIAGNOSIS — I10 Essential (primary) hypertension: Secondary | ICD-10-CM

## 2013-04-24 MED ORDER — METFORMIN HCL ER 500 MG PO TB24: 500.0000 mg | ORAL_TABLET | Freq: Two times a day (BID) | ORAL | Status: DC

## 2013-04-24 MED ORDER — LOSARTAN POTASSIUM-HCTZ 100-25 MG PO TABS: ORAL_TABLET | ORAL | Status: DC

## 2013-04-24 MED ORDER — METFORMIN HCL ER 750 MG PO TB24: 750.0000 mg | ORAL_TABLET | Freq: Two times a day (BID) | ORAL | Status: DC

## 2013-04-24 NOTE — Assessment & Plan Note (Signed)
D/c Kombiglyze - too $$$ Start Metformin XR  RTC 3 mo

## 2013-04-24 NOTE — Progress Notes (Signed)
Pre visit review using our clinic review tool, if applicable. No additional management support is needed unless otherwise documented below in the visit note. 

## 2013-04-24 NOTE — Telephone Encounter (Signed)
Pt came for OV today to address this. Closing phone note.

## 2013-04-24 NOTE — Telephone Encounter (Signed)
See 04/24/13 OV.

## 2013-04-24 NOTE — Assessment & Plan Note (Signed)
Watching labs 

## 2013-04-24 NOTE — Progress Notes (Signed)
   Subjective:    HPI    The patient presents for a follow-up of  chronic hypertension, chronic dyslipidemia, type 2 diabetes controlled with medicines. BP is ok at home C/o [place] - too $$$  Wt Readings from Last 3 Encounters:  04/24/13 213 lb (96.616 kg)  04/04/13 212 lb (96.163 kg)  02/06/13 212 lb (96.163 kg)    BP Readings from Last 3 Encounters:  04/24/13 160/80  04/04/13 138/60  02/06/13 132/70     Review of Systems  Constitutional: Negative for appetite change and unexpected weight change.  HENT: Negative for congestion, nosebleeds, sneezing, sore throat and trouble swallowing.   Eyes: Negative for itching and visual disturbance.  Respiratory: Negative for cough.   Cardiovascular: Negative for chest pain, palpitations and leg swelling.  Gastrointestinal: Negative for nausea, diarrhea, blood in stool and abdominal distention.  Genitourinary: Negative for frequency and hematuria.  Musculoskeletal: Negative for back pain, gait problem, joint swelling and neck pain.  Skin: Negative for rash.  Neurological: Negative for dizziness, tremors, speech difficulty and weakness.  Psychiatric/Behavioral: Negative for sleep disturbance, dysphoric mood and agitation. The patient is not nervous/anxious.        Objective:   Physical Exam  Constitutional: He is oriented to person, place, and time. He appears well-developed.  obese  HENT:  Mouth/Throat: Oropharynx is clear and moist.  Eyes: Conjunctivae are normal. Pupils are equal, round, and reactive to light.  Neck: Normal range of motion. No JVD present. No thyromegaly present.  Cardiovascular: Normal rate, regular rhythm, normal heart sounds and intact distal pulses.  Exam reveals no gallop and no friction rub.   No murmur heard. Pulmonary/Chest: Effort normal and breath sounds normal. No respiratory distress. He has no wheezes. He has no rales. He exhibits no tenderness.  Abdominal: Soft. Bowel sounds are normal. He  exhibits no distension and no mass. There is no tenderness. There is no rebound and no guarding.  Musculoskeletal: Normal range of motion. He exhibits no edema and no tenderness.  Lymphadenopathy:    He has no cervical adenopathy.  Neurological: He is alert and oriented to person, place, and time. He has normal reflexes. No cranial nerve deficit. He exhibits normal muscle tone. Coordination normal.  Skin: Skin is warm and dry. No rash noted.  Psychiatric: He has a normal mood and affect. His behavior is normal. Judgment and thought content normal.     Lab Results  Component Value Date   WBC 10.3 10/03/2012   HGB 15.8 10/03/2012   HCT 46.0 10/03/2012   PLT 224.0 10/03/2012   GLUCOSE 119* 04/04/2013   CHOL 122 10/03/2012   TRIG 166.0* 10/03/2012   HDL 29.20* 10/03/2012   LDLDIRECT 78.4 11/02/2011   LDLCALC 60 10/03/2012   ALT 19 10/03/2012   AST 17 10/03/2012   NA 139 04/04/2013   K 4.7 04/04/2013   CL 101 04/04/2013   CREATININE 1.4 04/04/2013   BUN 16 04/04/2013   CO2 30 04/04/2013   TSH 1.53 10/03/2012   PSA 1.48 10/03/2012   HGBA1C 6.8* 04/04/2013          Assessment & Plan:

## 2013-04-24 NOTE — Assessment & Plan Note (Signed)
Continue with current prescription therapy as reflected on the Med list. NAS diet 

## 2013-05-08 ENCOUNTER — Telehealth: Payer: Self-pay

## 2013-05-08 NOTE — Telephone Encounter (Signed)
Relevant patient education assigned to patient using Emmi. ° °

## 2013-08-07 ENCOUNTER — Ambulatory Visit: Payer: Medicare HMO | Admitting: Internal Medicine

## 2013-08-15 ENCOUNTER — Other Ambulatory Visit (INDEPENDENT_AMBULATORY_CARE_PROVIDER_SITE_OTHER): Payer: Commercial Managed Care - HMO

## 2013-08-15 DIAGNOSIS — I10 Essential (primary) hypertension: Secondary | ICD-10-CM

## 2013-08-15 DIAGNOSIS — E1129 Type 2 diabetes mellitus with other diabetic kidney complication: Secondary | ICD-10-CM

## 2013-08-15 LAB — BASIC METABOLIC PANEL
BUN: 17 mg/dL (ref 6–23)
CALCIUM: 9.6 mg/dL (ref 8.4–10.5)
CHLORIDE: 102 meq/L (ref 96–112)
CO2: 30 meq/L (ref 19–32)
Creatinine, Ser: 1.3 mg/dL (ref 0.4–1.5)
GFR: 56.98 mL/min — ABNORMAL LOW (ref >60.00)
GLUCOSE: 167 mg/dL — AB (ref 70–99)
POTASSIUM: 4.7 meq/L (ref 3.5–5.1)
SODIUM: 139 meq/L (ref 135–145)

## 2013-08-15 LAB — HEMOGLOBIN A1C: Hgb A1c MFr Bld: 7.7 % — ABNORMAL HIGH (ref 4.6–6.5)

## 2013-08-17 ENCOUNTER — Encounter: Payer: Self-pay | Admitting: Internal Medicine

## 2013-08-17 ENCOUNTER — Ambulatory Visit (INDEPENDENT_AMBULATORY_CARE_PROVIDER_SITE_OTHER): Payer: Commercial Managed Care - HMO | Admitting: Internal Medicine

## 2013-08-17 VITALS — BP 159/82 | HR 84 | Temp 97.9°F | Wt 206.0 lb

## 2013-08-17 DIAGNOSIS — M545 Low back pain, unspecified: Secondary | ICD-10-CM

## 2013-08-17 DIAGNOSIS — E1121 Type 2 diabetes mellitus with diabetic nephropathy: Secondary | ICD-10-CM

## 2013-08-17 DIAGNOSIS — Z0000 Encounter for general adult medical examination without abnormal findings: Secondary | ICD-10-CM

## 2013-08-17 DIAGNOSIS — E1129 Type 2 diabetes mellitus with other diabetic kidney complication: Secondary | ICD-10-CM

## 2013-08-17 DIAGNOSIS — N058 Unspecified nephritic syndrome with other morphologic changes: Secondary | ICD-10-CM

## 2013-08-17 DIAGNOSIS — I10 Essential (primary) hypertension: Secondary | ICD-10-CM

## 2013-08-17 DIAGNOSIS — N320 Bladder-neck obstruction: Secondary | ICD-10-CM

## 2013-08-17 DIAGNOSIS — N259 Disorder resulting from impaired renal tubular function, unspecified: Secondary | ICD-10-CM

## 2013-08-17 MED ORDER — METFORMIN HCL ER 750 MG PO TB24: 750.0000 mg | ORAL_TABLET | Freq: Two times a day (BID) | ORAL | Status: DC

## 2013-08-17 MED ORDER — METFORMIN HCL ER 500 MG PO TB24: 500.0000 mg | ORAL_TABLET | Freq: Two times a day (BID) | ORAL | Status: DC

## 2013-08-17 NOTE — Progress Notes (Signed)
Pre visit review using our clinic review tool, if applicable. No additional management support is needed unless otherwise documented below in the visit note. 

## 2013-08-17 NOTE — Assessment & Plan Note (Signed)
Continue with current prescription therapy as reflected on the Med list.  

## 2013-08-17 NOTE — Assessment & Plan Note (Signed)
Continue with current prescription therapy as reflected on the Med list and loose wt [place].

## 2013-08-17 NOTE — Assessment & Plan Note (Signed)
Continue with current prn prescription therapy as reflected on the Med list.  

## 2013-08-17 NOTE — Assessment & Plan Note (Signed)
Watching  

## 2013-08-17 NOTE — Progress Notes (Signed)
   Subjective:    HPI    The patient presents for a follow-up of  chronic hypertension, chronic dyslipidemia, type 2 diabetes controlled with medicines. BP is ok at home   Wt Readings from Last 3 Encounters:  08/17/13 206 lb (93.441 kg)  04/24/13 213 lb (96.616 kg)  04/04/13 212 lb (96.163 kg)    BP Readings from Last 3 Encounters:  08/17/13 159/82  04/24/13 160/80  04/04/13 138/60     Review of Systems  Constitutional: Negative for appetite change and unexpected weight change.  HENT: Negative for congestion, nosebleeds, sneezing, sore throat and trouble swallowing.   Eyes: Negative for itching and visual disturbance.  Respiratory: Negative for cough.   Cardiovascular: Negative for chest pain, palpitations and leg swelling.  Gastrointestinal: Negative for nausea, diarrhea, blood in stool and abdominal distention.  Genitourinary: Negative for frequency and hematuria.  Musculoskeletal: Negative for back pain, gait problem, joint swelling and neck pain.  Skin: Negative for rash.  Neurological: Negative for dizziness, tremors, speech difficulty and weakness.  Psychiatric/Behavioral: Negative for sleep disturbance, dysphoric mood and agitation. The patient is not nervous/anxious.        Objective:   Physical Exam  Constitutional: He is oriented to person, place, and time. He appears well-developed.  obese  HENT:  Mouth/Throat: Oropharynx is clear and moist.  Eyes: Conjunctivae are normal. Pupils are equal, round, and reactive to light.  Neck: Normal range of motion. No JVD present. No thyromegaly present.  Cardiovascular: Normal rate, regular rhythm, normal heart sounds and intact distal pulses.  Exam reveals no gallop and no friction rub.   No murmur heard. Pulmonary/Chest: Effort normal and breath sounds normal. No respiratory distress. He has no wheezes. He has no rales. He exhibits no tenderness.  Abdominal: Soft. Bowel sounds are normal. He exhibits no distension and  no mass. There is no tenderness. There is no rebound and no guarding.  Musculoskeletal: Normal range of motion. He exhibits no edema and no tenderness.  Lymphadenopathy:    He has no cervical adenopathy.  Neurological: He is alert and oriented to person, place, and time. He has normal reflexes. No cranial nerve deficit. He exhibits normal muscle tone. Coordination normal.  Skin: Skin is warm and dry. No rash noted.  Psychiatric: He has a normal mood and affect. His behavior is normal. Judgment and thought content normal.     Lab Results  Component Value Date   WBC 10.3 10/03/2012   HGB 15.8 10/03/2012   HCT 46.0 10/03/2012   PLT 224.0 10/03/2012   GLUCOSE 167* 08/15/2013   CHOL 122 10/03/2012   TRIG 166.0* 10/03/2012   HDL 29.20* 10/03/2012   LDLDIRECT 78.4 11/02/2011   LDLCALC 60 10/03/2012   ALT 19 10/03/2012   AST 17 10/03/2012   NA 139 08/15/2013   K 4.7 08/15/2013   CL 102 08/15/2013   CREATININE 1.3 08/15/2013   BUN 17 08/15/2013   CO2 30 08/15/2013   TSH 1.53 10/03/2012   PSA 1.48 10/03/2012   HGBA1C 7.7* 08/15/2013          Assessment & Plan:

## 2013-10-12 ENCOUNTER — Other Ambulatory Visit: Payer: Self-pay | Admitting: Internal Medicine

## 2013-12-18 ENCOUNTER — Encounter: Payer: Commercial Managed Care - HMO | Admitting: Internal Medicine

## 2014-01-14 ENCOUNTER — Other Ambulatory Visit (INDEPENDENT_AMBULATORY_CARE_PROVIDER_SITE_OTHER): Payer: Commercial Managed Care - HMO

## 2014-01-14 DIAGNOSIS — I10 Essential (primary) hypertension: Secondary | ICD-10-CM | POA: Diagnosis not present

## 2014-01-14 DIAGNOSIS — E1129 Type 2 diabetes mellitus with other diabetic kidney complication: Secondary | ICD-10-CM | POA: Diagnosis not present

## 2014-01-14 LAB — BASIC METABOLIC PANEL
BUN: 16 mg/dL (ref 6–23)
CO2: 28 meq/L (ref 19–32)
CREATININE: 1.3 mg/dL (ref 0.4–1.5)
Calcium: 9.8 mg/dL (ref 8.4–10.5)
Chloride: 100 mEq/L (ref 96–112)
GFR: 58.47 mL/min — ABNORMAL LOW (ref >60.00)
GLUCOSE: 183 mg/dL — AB (ref 70–99)
Potassium: 4.4 mEq/L (ref 3.5–5.1)
Sodium: 140 mEq/L (ref 135–145)

## 2014-01-14 LAB — HEMOGLOBIN A1C: Hgb A1c MFr Bld: 7.7 % — ABNORMAL HIGH (ref 4.6–6.5)

## 2014-01-18 ENCOUNTER — Ambulatory Visit (INDEPENDENT_AMBULATORY_CARE_PROVIDER_SITE_OTHER): Payer: Commercial Managed Care - HMO | Admitting: Internal Medicine

## 2014-01-18 ENCOUNTER — Encounter: Payer: Self-pay | Admitting: Internal Medicine

## 2014-01-18 VITALS — BP 160/104 | HR 84 | Temp 97.4°F | Resp 12 | Ht 69.0 in | Wt 208.0 lb

## 2014-01-18 DIAGNOSIS — N259 Disorder resulting from impaired renal tubular function, unspecified: Secondary | ICD-10-CM

## 2014-01-18 DIAGNOSIS — E1121 Type 2 diabetes mellitus with diabetic nephropathy: Secondary | ICD-10-CM

## 2014-01-18 DIAGNOSIS — I10 Essential (primary) hypertension: Secondary | ICD-10-CM

## 2014-01-18 MED ORDER — CARVEDILOL 12.5 MG PO TABS: 12.5000 mg | ORAL_TABLET | Freq: Two times a day (BID) | ORAL | Status: DC

## 2014-01-18 NOTE — Assessment & Plan Note (Signed)
Added Coreg bid 

## 2014-01-18 NOTE — Assessment & Plan Note (Signed)
Continue with current prescription therapy as reflected on the Med list.  

## 2014-01-18 NOTE — Patient Instructions (Signed)
Low carb diet 

## 2014-01-18 NOTE — Progress Notes (Signed)
   Subjective:    HPI    The patient presents for a follow-up of  chronic hypertension, chronic dyslipidemia, type 2 diabetes controlled with medicines. BP is ok at home   Wt Readings from Last 3 Encounters:  01/18/14 208 lb (94.348 kg)  08/17/13 206 lb (93.441 kg)  04/24/13 213 lb (96.616 kg)    BP Readings from Last 3 Encounters:  01/18/14 160/104  08/17/13 159/82  04/24/13 160/80     Review of Systems  Constitutional: Negative for appetite change and unexpected weight change.  HENT: Negative for congestion, nosebleeds, sneezing, sore throat and trouble swallowing.   Eyes: Negative for itching and visual disturbance.  Respiratory: Negative for cough.   Cardiovascular: Negative for chest pain, palpitations and leg swelling.  Gastrointestinal: Negative for nausea, diarrhea, blood in stool and abdominal distention.  Genitourinary: Negative for frequency and hematuria.  Musculoskeletal: Negative for back pain, joint swelling, gait problem and neck pain.  Skin: Negative for rash.  Neurological: Negative for dizziness, tremors, speech difficulty and weakness.  Psychiatric/Behavioral: Negative for sleep disturbance, dysphoric mood and agitation. The patient is not nervous/anxious.        Objective:   Physical Exam  Constitutional: He is oriented to person, place, and time. He appears well-developed. No distress.  NAD  HENT:  Mouth/Throat: Oropharynx is clear and moist.  Eyes: Conjunctivae are normal. Pupils are equal, round, and reactive to light.  Neck: Normal range of motion. No JVD present. No thyromegaly present.  Cardiovascular: Normal rate, regular rhythm, normal heart sounds and intact distal pulses.  Exam reveals no gallop and no friction rub.   No murmur heard. Pulmonary/Chest: Effort normal and breath sounds normal. No respiratory distress. He has no wheezes. He has no rales. He exhibits no tenderness.  Abdominal: Soft. Bowel sounds are normal. He exhibits no  distension and no mass. There is no tenderness. There is no rebound and no guarding.  Musculoskeletal: Normal range of motion. He exhibits no edema or tenderness.  Lymphadenopathy:    He has no cervical adenopathy.  Neurological: He is alert and oriented to person, place, and time. He has normal reflexes. No cranial nerve deficit. He exhibits normal muscle tone. He displays a negative Romberg sign. Coordination and gait normal.  No meningeal signs  Skin: Skin is warm and dry. No rash noted.  Psychiatric: He has a normal mood and affect. His behavior is normal. Judgment and thought content normal.     Lab Results  Component Value Date   WBC 10.3 10/03/2012   HGB 15.8 10/03/2012   HCT 46.0 10/03/2012   PLT 224.0 10/03/2012   GLUCOSE 183* 01/14/2014   CHOL 122 10/03/2012   TRIG 166.0* 10/03/2012   HDL 29.20* 10/03/2012   LDLDIRECT 78.4 11/02/2011   LDLCALC 60 10/03/2012   ALT 19 10/03/2012   AST 17 10/03/2012   NA 140 01/14/2014   K 4.4 01/14/2014   CL 100 01/14/2014   CREATININE 1.3 01/14/2014   BUN 16 01/14/2014   CO2 28 01/14/2014   TSH 1.53 10/03/2012   PSA 1.48 10/03/2012   HGBA1C 7.7* 01/14/2014          Assessment & Plan:

## 2014-01-18 NOTE — Assessment & Plan Note (Signed)
Labs BP control

## 2014-03-04 ENCOUNTER — Other Ambulatory Visit: Payer: Self-pay | Admitting: Internal Medicine

## 2014-04-15 ENCOUNTER — Other Ambulatory Visit: Payer: Self-pay | Admitting: Internal Medicine

## 2014-05-04 ENCOUNTER — Other Ambulatory Visit: Payer: Self-pay | Admitting: Internal Medicine

## 2014-05-13 ENCOUNTER — Other Ambulatory Visit: Payer: Self-pay | Admitting: Internal Medicine

## 2014-05-21 ENCOUNTER — Other Ambulatory Visit (INDEPENDENT_AMBULATORY_CARE_PROVIDER_SITE_OTHER): Payer: Commercial Managed Care - HMO

## 2014-05-21 DIAGNOSIS — E1129 Type 2 diabetes mellitus with other diabetic kidney complication: Secondary | ICD-10-CM | POA: Diagnosis not present

## 2014-05-21 DIAGNOSIS — I10 Essential (primary) hypertension: Secondary | ICD-10-CM

## 2014-05-21 LAB — HEMOGLOBIN A1C: Hgb A1c MFr Bld: 7.4 % — ABNORMAL HIGH (ref 4.6–6.5)

## 2014-05-21 LAB — BASIC METABOLIC PANEL
BUN: 20 mg/dL (ref 6–23)
CO2: 28 mEq/L (ref 19–32)
Calcium: 9.8 mg/dL (ref 8.4–10.5)
Chloride: 99 mEq/L (ref 96–112)
Creatinine, Ser: 1.35 mg/dL (ref 0.40–1.50)
GFR: 54.44 mL/min — AB (ref >60.00)
GLUCOSE: 212 mg/dL — AB (ref 70–99)
POTASSIUM: 4.5 meq/L (ref 3.5–5.1)
Sodium: 136 mEq/L (ref 135–145)

## 2014-05-27 ENCOUNTER — Encounter: Payer: Self-pay | Admitting: Internal Medicine

## 2014-05-27 ENCOUNTER — Ambulatory Visit (INDEPENDENT_AMBULATORY_CARE_PROVIDER_SITE_OTHER): Payer: Commercial Managed Care - HMO | Admitting: Internal Medicine

## 2014-05-27 VITALS — BP 140/80 | HR 60 | Wt 208.0 lb

## 2014-05-27 DIAGNOSIS — M5440 Lumbago with sciatica, unspecified side: Secondary | ICD-10-CM

## 2014-05-27 DIAGNOSIS — E1122 Type 2 diabetes mellitus with diabetic chronic kidney disease: Secondary | ICD-10-CM

## 2014-05-27 DIAGNOSIS — I10 Essential (primary) hypertension: Secondary | ICD-10-CM

## 2014-05-27 DIAGNOSIS — N189 Chronic kidney disease, unspecified: Secondary | ICD-10-CM

## 2014-05-27 DIAGNOSIS — E785 Hyperlipidemia, unspecified: Secondary | ICD-10-CM | POA: Diagnosis not present

## 2014-05-27 NOTE — Assessment & Plan Note (Signed)
MSK/OA Chronic  Tylenol prn

## 2014-05-27 NOTE — Assessment & Plan Note (Signed)
Chronic SBP 120-130 at home Coreg, Norvasc, Hyzaar

## 2014-05-27 NOTE — Assessment & Plan Note (Addendum)
Better On Metformin Discussed Invokana - pricy. Discussed Glimepiride.

## 2014-05-27 NOTE — Progress Notes (Signed)
   Subjective:    HPI    The patient presents for a follow-up of  chronic hypertension, chronic dyslipidemia, type 2 diabetes controlled with medicines. BP is ok at home   Wt Readings from Last 3 Encounters:  05/27/14 208 lb (94.348 kg)  01/18/14 208 lb (94.348 kg)  08/17/13 206 lb (93.441 kg)    BP Readings from Last 3 Encounters:  05/27/14 150/76  01/18/14 160/104  08/17/13 159/82     Review of Systems  Constitutional: Negative for appetite change and unexpected weight change.  HENT: Negative for congestion, nosebleeds, sneezing, sore throat and trouble swallowing.   Eyes: Negative for itching and visual disturbance.  Respiratory: Negative for cough.   Cardiovascular: Negative for chest pain, palpitations and leg swelling.  Gastrointestinal: Negative for nausea, diarrhea, blood in stool and abdominal distention.  Genitourinary: Negative for frequency and hematuria.  Musculoskeletal: Negative for back pain, joint swelling, gait problem and neck pain.  Skin: Negative for rash.  Neurological: Negative for dizziness, tremors, speech difficulty and weakness.  Psychiatric/Behavioral: Negative for sleep disturbance, dysphoric mood and agitation. The patient is not nervous/anxious.        Objective:   Physical Exam  Constitutional: He is oriented to person, place, and time. He appears well-developed. No distress.  NAD  HENT:  Mouth/Throat: Oropharynx is clear and moist.  Eyes: Conjunctivae are normal. Pupils are equal, round, and reactive to light.  Neck: Normal range of motion. No JVD present. No thyromegaly present.  Cardiovascular: Normal rate, regular rhythm, normal heart sounds and intact distal pulses.  Exam reveals no gallop and no friction rub.   No murmur heard. Pulmonary/Chest: Effort normal and breath sounds normal. No respiratory distress. He has no wheezes. He has no rales. He exhibits no tenderness.  Abdominal: Soft. Bowel sounds are normal. He exhibits no  distension and no mass. There is no tenderness. There is no rebound and no guarding.  Musculoskeletal: Normal range of motion. He exhibits no edema or tenderness.  Lymphadenopathy:    He has no cervical adenopathy.  Neurological: He is alert and oriented to person, place, and time. He has normal reflexes. No cranial nerve deficit. He exhibits normal muscle tone. He displays a negative Romberg sign. Coordination and gait normal.  No meningeal signs  Skin: Skin is warm and dry. No rash noted.  Psychiatric: He has a normal mood and affect. His behavior is normal. Judgment and thought content normal.     Lab Results  Component Value Date   WBC 10.3 10/03/2012   HGB 15.8 10/03/2012   HCT 46.0 10/03/2012   PLT 224.0 10/03/2012   GLUCOSE 212* 05/21/2014   CHOL 122 10/03/2012   TRIG 166.0* 10/03/2012   HDL 29.20* 10/03/2012   LDLDIRECT 78.4 11/02/2011   LDLCALC 60 10/03/2012   ALT 19 10/03/2012   AST 17 10/03/2012   NA 136 05/21/2014   K 4.5 05/21/2014   CL 99 05/21/2014   CREATININE 1.35 05/21/2014   BUN 20 05/21/2014   CO2 28 05/21/2014   TSH 1.53 10/03/2012   PSA 1.48 10/03/2012   HGBA1C 7.4* 05/21/2014          Assessment & Plan:

## 2014-05-27 NOTE — Assessment & Plan Note (Signed)
On Lovastatin

## 2014-05-27 NOTE — Progress Notes (Signed)
Pre visit review using our clinic review tool, if applicable. No additional management support is needed unless otherwise documented below in the visit note. 

## 2014-06-03 ENCOUNTER — Telehealth: Payer: Self-pay | Admitting: Internal Medicine

## 2014-06-03 MED ORDER — CANAGLIFLOZIN 100 MG PO TABS: 100.0000 mg | ORAL_TABLET | Freq: Every day | ORAL | Status: DC

## 2014-06-03 NOTE — Telephone Encounter (Signed)
Patient has decided that he would like the prescription for Invokana. Pharmacy is [facility] [address]

## 2014-06-03 NOTE — Telephone Encounter (Signed)
Ok Thx 

## 2014-06-04 NOTE — Telephone Encounter (Signed)
Pt informed

## 2014-07-12 ENCOUNTER — Encounter: Payer: Self-pay | Admitting: Internal Medicine

## 2014-07-12 ENCOUNTER — Telehealth: Payer: Self-pay | Admitting: Internal Medicine

## 2014-07-12 ENCOUNTER — Ambulatory Visit (INDEPENDENT_AMBULATORY_CARE_PROVIDER_SITE_OTHER): Payer: Commercial Managed Care - HMO | Admitting: Internal Medicine

## 2014-07-12 VITALS — BP 140/76 | HR 57 | Temp 97.5°F | Resp 18 | Wt 206.0 lb

## 2014-07-12 DIAGNOSIS — I25810 Atherosclerosis of coronary artery bypass graft(s) without angina pectoris: Secondary | ICD-10-CM | POA: Diagnosis not present

## 2014-07-12 DIAGNOSIS — R0789 Other chest pain: Secondary | ICD-10-CM | POA: Diagnosis not present

## 2014-07-12 DIAGNOSIS — I4510 Unspecified right bundle-branch block: Secondary | ICD-10-CM | POA: Diagnosis not present

## 2014-07-12 NOTE — Patient Instructions (Signed)
The Stress Test  referral will be scheduled and you'll be notified of the time.Please call the Referral Co-Ordinator @ [phone] if you have not been notified of appointment time within 7-10 days.

## 2014-07-12 NOTE — Telephone Encounter (Signed)
PLEASE NOTE: All timestamps contained within this report are represented as [place] Standard Time. CONFIDENTIALTY NOTICE: This fax transmission is intended only for the addressee. It contains information that is legally privileged, confidential or otherwise protected from use or disclosure. If you are not the intended recipient, you are strictly prohibited from reviewing, disclosing, copying using or disseminating any of this information or taking any action in reliance on or regarding this information. If you have received this fax in error, please notify us immediately by telephone so that we can arrange for its return to [place]. Phone: [phone], Toll-Free: [phone], Fax: [phone] Page: 1 of 1 Call Id: [id number] [place] Patient Name: Jeff Burgess DOB: [birth year] Initial Comment Caller says for the past couple of weeks he noticed his breathing is a bit short, and has some tightness on his chest. Today he walked on the thread [place] and after 20 minutes, his breathing was normal. No Sx now. He has an apt next Friday Nurse Assessment Nurse: Markus Daft, RN, Sherre Poot Date/Time Eilene Ghazi Time): 07/12/2014 10:06:14 AM Confirm and document reason for call. If symptomatic, describe symptoms. ---Caller states that he noticed he has had some shortness of breath, and has some tightness on his chest. 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 Denies s/ s. He has an appt next Friday. He has noticed this for 3-4 wks. during exercise. Has the patient traveled out of the country within the last 30 days? ---Not Applicable Does the patient require triage? ---Yes Related visit to physician within the last 2 weeks? ---No Does the PT have any chronic conditions?  (i.e. diabetes, asthma, etc.) ---Yes List chronic conditions. ---HTN, NIDDM, 1995 CABG x 1 vessel, CAD Guidelines Guideline Title Affirmed Question Affirmed Notes Chest Pain Chest pain lasts > 5 minutes (Exceptions: chest pain occurring > 3 days ago and now asymptomatic Final Disposition User Go to ED Now (or PCP triage) Markus Daft, RN, Windy Comments RN spoke with staff, Colletta [place], and she will work pt in with Dr. Huey Bienenstock at 10:45 am, and staff aware he lives 30 min. away, and will be just a few minutes late. He is leaving now.

## 2014-07-12 NOTE — Progress Notes (Signed)
   Subjective:    Patient ID: Jeff Burgess, male    DOB: 1935-10-12, 79 y.o.   MRN: [id number]  HPI Over the last 2-3 weeks he notes slight shortness of breath and chest tightness after walking 20 minutes. He can walk through it as he's been exercising @ a high-level. He tries to walk 3 days four miles per day and 2 miles 2 days out of the week. There is no no radiation of the chest tightness and no associated sweating or nausea.  He denies any GI symptoms or other cardiovascular symptoms.  He had a single-vessel bypass in 1997. That presentation was as shortness of breath walking across a parking lot at work.   He smoked for 15 years a pack per day. He quit smoking in 1973.  The brother had a heart attack while on dialysis.   Review of Systems Palpitations, tachycardia, exertional dyspnea, paroxysmal nocturnal dyspnea, claudication or edema are absent.  Unexplained weight loss, abdominal pain, significant dyspepsia, dysphagia, melena, rectal bleeding, or persistently small caliber stools are denied.    Objective:   Physical Exam  He has pattern alopecia. He sports a Engineering geologist. He has complete dentures. There is a slow S4. A large ventral hernia is present. Pedal pulses are decreased. He has scattered tattoos.  Pertinent or positive findings include: General appearance :adequately nourished; in no distress.  Eyes: No conjunctival inflammation or scleral icterus is present.  Oral exam:  Lips and gums are healthy appearing.There is no oropharyngeal erythema or exudate noted.   Heart:  regular rhythm. S1 and S2 normal without gallop, murmur, click, or rub.    Lungs:Chest clear to auscultation; no wheezes, rhonchi,rales ,or rubs present.No increased work of breathing.   Abdomen: bowel sounds normal, soft and non-tender without masses, or organomegaly noted.  No guarding or rebound.   Vascular : all pulses equal ; no bruits present.  Skin:Warm & dry.  Intact without suspicious  lesions or rashes ; no tenting or jaundice   Lymphatic: No lymphadenopathy is noted about the head, neck, axilla   Neuro: Strength, tone & DTRs normal.         Assessment & Plan:  #1 dyspnea on exertion   #2 exertional chest discomfort  #3 past history of one-vessel bypass. EKG reveals right bundle branch block.  Plan: He'll be referred for stress testing.

## 2014-07-12 NOTE — Telephone Encounter (Signed)
Pt scheduled to see Dr. Linna Darner today.

## 2014-07-12 NOTE — Progress Notes (Signed)
Pre visit review using our clinic review tool, if applicable. No additional management support is needed unless otherwise documented below in the visit note. 

## 2014-07-16 ENCOUNTER — Other Ambulatory Visit: Payer: Self-pay | Admitting: Internal Medicine

## 2014-07-17 DIAGNOSIS — Z0100 Encounter for examination of eyes and vision without abnormal findings: Secondary | ICD-10-CM | POA: Diagnosis not present

## 2014-07-17 DIAGNOSIS — E119 Type 2 diabetes mellitus without complications: Secondary | ICD-10-CM | POA: Diagnosis not present

## 2014-07-17 DIAGNOSIS — H2513 Age-related nuclear cataract, bilateral: Secondary | ICD-10-CM | POA: Diagnosis not present

## 2014-07-17 DIAGNOSIS — H4322 Crystalline deposits in vitreous body, left eye: Secondary | ICD-10-CM | POA: Diagnosis not present

## 2014-07-17 DIAGNOSIS — H538 Other visual disturbances: Secondary | ICD-10-CM | POA: Diagnosis not present

## 2014-07-18 ENCOUNTER — Telehealth: Payer: Self-pay | Admitting: Internal Medicine

## 2014-07-18 ENCOUNTER — Other Ambulatory Visit: Payer: Self-pay | Admitting: Internal Medicine

## 2014-07-18 DIAGNOSIS — R079 Chest pain, unspecified: Secondary | ICD-10-CM

## 2014-07-18 NOTE — Telephone Encounter (Signed)
Auth submitted to [employer]. Awaiting approval.

## 2014-07-18 NOTE — Telephone Encounter (Signed)
Patient would like to know the status of his stress test, please advise

## 2014-07-18 NOTE — Telephone Encounter (Signed)
Please advise, I only see an EKG done recently

## 2014-07-18 NOTE — Telephone Encounter (Signed)
ordered

## 2014-07-19 NOTE — Telephone Encounter (Signed)
No pre-cert req per Enis Gash @ [facility].

## 2014-07-19 NOTE — Telephone Encounter (Signed)
Pt scheduled for 08/09/14. Pt spouse is aware.

## 2014-07-26 ENCOUNTER — Ambulatory Visit: Payer: Commercial Managed Care - HMO | Admitting: Internal Medicine

## 2014-08-07 ENCOUNTER — Telehealth (HOSPITAL_COMMUNITY): Payer: Self-pay

## 2014-08-07 NOTE — Telephone Encounter (Signed)
Encounter complete. 

## 2014-08-07 NOTE — Telephone Encounter (Signed)
Encounter complete.  Calling pt back.

## 2014-08-09 ENCOUNTER — Ambulatory Visit (HOSPITAL_COMMUNITY)
Admission: RE | Admit: 2014-08-09 | Discharge: 2014-08-09 | Disposition: A | Discharge status: Home / Self Care | Payer: Commercial Managed Care - HMO | Source: Ambulatory Visit | Attending: Cardiology | Admitting: Cardiology

## 2014-08-09 DIAGNOSIS — R079 Chest pain, unspecified: Secondary | ICD-10-CM | POA: Insufficient documentation

## 2014-08-12 LAB — EXERCISE TOLERANCE TEST
CHL CUP RESTING HR STRESS: 82 {beats}/min
CHL RATE OF PERCEIVED EXERTION: 16
CSEPED: 8 min
CSEPEW: 10.1 METS
CSEPHR: 106 %
MPHR: 143 {beats}/min
Peak HR: 153 {beats}/min

## 2014-08-15 ENCOUNTER — Telehealth: Payer: Self-pay | Admitting: *Deleted

## 2014-08-15 ENCOUNTER — Telehealth: Payer: Self-pay | Admitting: Internal Medicine

## 2014-08-15 NOTE — Telephone Encounter (Signed)
Were these results sent to Dr. Unice Cobble on [facility]?  He was the ordering provider.

## 2014-08-15 NOTE — Telephone Encounter (Signed)
Patient is requesting call back with stress test results that Dr. Linna Darner had ordered.  He was told he would have results by Tuesday.

## 2014-08-15 NOTE — Telephone Encounter (Signed)
Spoke with pt, informed him on the results from the stress test.

## 2014-09-27 ENCOUNTER — Other Ambulatory Visit (INDEPENDENT_AMBULATORY_CARE_PROVIDER_SITE_OTHER): Payer: Commercial Managed Care - HMO

## 2014-09-27 DIAGNOSIS — E1122 Type 2 diabetes mellitus with diabetic chronic kidney disease: Secondary | ICD-10-CM | POA: Diagnosis not present

## 2014-09-27 DIAGNOSIS — I10 Essential (primary) hypertension: Secondary | ICD-10-CM | POA: Diagnosis not present

## 2014-09-27 DIAGNOSIS — N189 Chronic kidney disease, unspecified: Secondary | ICD-10-CM

## 2014-09-27 DIAGNOSIS — M5440 Lumbago with sciatica, unspecified side: Secondary | ICD-10-CM | POA: Diagnosis not present

## 2014-09-27 DIAGNOSIS — E785 Hyperlipidemia, unspecified: Secondary | ICD-10-CM

## 2014-09-27 LAB — BASIC METABOLIC PANEL
BUN: 24 mg/dL — ABNORMAL HIGH (ref 6–23)
CALCIUM: 10 mg/dL (ref 8.4–10.5)
CO2: 27 meq/L (ref 19–32)
Chloride: 100 mEq/L (ref 96–112)
Creatinine, Ser: 1.55 mg/dL — ABNORMAL HIGH (ref 0.40–1.50)
GFR: 46.38 mL/min — AB (ref >60.00)
Glucose, Bld: 155 mg/dL — ABNORMAL HIGH (ref 70–99)
POTASSIUM: 4.1 meq/L (ref 3.5–5.1)
SODIUM: 138 meq/L (ref 135–145)

## 2014-09-27 LAB — HEMOGLOBIN A1C: Hgb A1c MFr Bld: 7.1 % — ABNORMAL HIGH (ref 4.6–6.5)

## 2014-09-30 ENCOUNTER — Ambulatory Visit (INDEPENDENT_AMBULATORY_CARE_PROVIDER_SITE_OTHER): Payer: Commercial Managed Care - HMO | Admitting: Internal Medicine

## 2014-09-30 ENCOUNTER — Encounter: Payer: Self-pay | Admitting: Internal Medicine

## 2014-09-30 VITALS — BP 156/82 | HR 87 | Wt 204.0 lb

## 2014-09-30 DIAGNOSIS — E1122 Type 2 diabetes mellitus with diabetic chronic kidney disease: Secondary | ICD-10-CM | POA: Diagnosis not present

## 2014-09-30 DIAGNOSIS — I2510 Atherosclerotic heart disease of native coronary artery without angina pectoris: Secondary | ICD-10-CM | POA: Diagnosis not present

## 2014-09-30 DIAGNOSIS — I10 Essential (primary) hypertension: Secondary | ICD-10-CM | POA: Diagnosis not present

## 2014-09-30 DIAGNOSIS — Z23 Encounter for immunization: Secondary | ICD-10-CM

## 2014-09-30 DIAGNOSIS — N189 Chronic kidney disease, unspecified: Secondary | ICD-10-CM

## 2014-09-30 MED ORDER — CANAGLIFLOZIN 100 MG PO TABS: 50.0000 mg | ORAL_TABLET | Freq: Every day | ORAL | Status: DC

## 2014-09-30 NOTE — Progress Notes (Signed)
Pre visit review using our clinic review tool, if applicable. No additional management support is needed unless otherwise documented below in the visit note. 

## 2014-09-30 NOTE — Assessment & Plan Note (Signed)
On Metformin, Invokana Will reduce invokana to 1/2 tab/d, drink more water (elev BUN, creat)

## 2014-09-30 NOTE — Progress Notes (Signed)
Subjective:  Patient ID: Jeff Burgess, male    DOB: 1935/04/25  Age: 79 y.o. MRN: [id number]  CC: No chief complaint on file.   HPI Jeff Burgess presents for CAD, HTN, DM f/u. Pt hat a nl stress test. Pt had weakness due to coreg - he stopped it...  Outpatient Prescriptions Prior to Visit  Medication Sig Dispense Refill  . amLODipine (NORVASC) 10 MG tablet TAKE ONE TABLET BY MOUTH ONCE DAILY 90 tablet 3  . aspirin 81 MG tablet Take 81 mg by mouth daily.      . Cholecalciferol (VITAMIN D3) 1000 UNITS tablet Take 1,000 Units by mouth daily.      . clotrimazole-betamethasone (LOTRISONE) cream Apply 1 application topically as needed.     . Glucosamine-Chondroitin (MOVE FREE PO) Take by mouth 2 (two) times daily.    [name] glucose blood (ONE TOUCH ULTRA TEST) test strip check your blood sugar 1 time a day.  vary the time of day when you check, between before the 3 meals, and at bedtime. 100 each 12  . losartan-hydrochlorothiazide (HYZAAR) 100-25 MG per tablet TAKE ONE TABLET BY MOUTH EVERY DAY 90 tablet 3  . lovastatin (MEVACOR) 40 MG tablet TAKE ONE TABLET BY MOUTH AT BEDTIME 90 tablet 3  . metFORMIN (GLUCOPHAGE-XR) 500 MG 24 hr tablet TAKE ONE TABLET BY MOUTH TWICE DAILY 180 tablet 3  . mupirocin ointment (BACTROBAN) 2 % Use bid 30 g 0  . triamcinolone (KENALOG) 0.1 % ointment Apply 1 application topically as needed.     . canagliflozin (INVOKANA) 100 MG TABS tablet Take 1 tablet (100 mg total) by mouth daily. 30 tablet 11  . carvedilol (COREG) 12.5 MG tablet Take 1 tablet (12.5 mg total) by mouth 2 (two) times daily with a meal. (Patient not taking: Reported on 09/30/2014) 60 tablet 11   No facility-administered medications prior to visit.    ROS Review of Systems  Constitutional: Positive for fatigue. Negative for appetite change and unexpected weight change.  HENT: Negative for congestion, nosebleeds, sneezing, sore throat and trouble swallowing.   Eyes: Negative for itching and  visual disturbance.  Respiratory: Negative for cough.   Cardiovascular: Negative for chest pain, palpitations and leg swelling.  Gastrointestinal: Negative for nausea, diarrhea, blood in stool and abdominal distention.  Genitourinary: Negative for frequency and hematuria.  Musculoskeletal: Negative for back pain, joint swelling, gait problem and neck pain.  Skin: Negative for rash.  Neurological: Negative for dizziness, tremors, speech difficulty and weakness.  Psychiatric/Behavioral: Negative for suicidal ideas, sleep disturbance, dysphoric mood and agitation. The patient is not nervous/anxious.     Objective:  BP 156/82 mmHg  Pulse 87  Wt 204 lb (92.534 kg)  SpO2 96%  BP Readings from Last 3 Encounters:  09/30/14 156/82  07/12/14 140/76  05/27/14 140/80    Wt Readings from Last 3 Encounters:  09/30/14 204 lb (92.534 kg)  07/12/14 206 lb (93.441 kg)  05/27/14 208 lb (94.348 kg)    Physical Exam  Constitutional: He is oriented to person, place, and time. He appears well-developed. No distress.  NAD  HENT:  Mouth/Throat: Oropharynx is clear and moist.  Eyes: Conjunctivae are normal. Pupils are equal, round, and reactive to light.  Neck: Normal range of motion. No JVD present. No thyromegaly present.  Cardiovascular: Normal rate, regular rhythm, normal heart sounds and intact distal pulses.  Exam reveals no gallop and no friction rub.   No murmur heard. Pulmonary/Chest: Effort normal and breath sounds  normal. No respiratory distress. He has no wheezes. He has no rales. He exhibits no tenderness.  Abdominal: Soft. Bowel sounds are normal. He exhibits no distension and no mass. There is no tenderness. There is no rebound and no guarding.  Musculoskeletal: Normal range of motion. He exhibits no edema or tenderness.  Lymphadenopathy:    He has no cervical adenopathy.  Neurological: He is alert and oriented to person, place, and time. He has normal reflexes. No cranial nerve  deficit. He exhibits normal muscle tone. He displays a negative Romberg sign. Coordination and gait normal.  Skin: Skin is warm and dry. No rash noted.  Psychiatric: He has a normal mood and affect. His behavior is normal. Judgment and thought content normal.    Lab Results  Component Value Date   WBC 10.3 10/03/2012   HGB 15.8 10/03/2012   HCT 46.0 10/03/2012   PLT 224.0 10/03/2012   GLUCOSE 155* 09/27/2014   CHOL 122 10/03/2012   TRIG 166.0* 10/03/2012   HDL 29.20* 10/03/2012   LDLDIRECT 78.4 11/02/2011   LDLCALC 60 10/03/2012   ALT 19 10/03/2012   AST 17 10/03/2012   NA 138 09/27/2014   K 4.1 09/27/2014   CL 100 09/27/2014   CREATININE 1.55* 09/27/2014   BUN 24* 09/27/2014   CO2 27 09/27/2014   TSH 1.53 10/03/2012   PSA 1.48 10/03/2012   HGBA1C 7.1* 09/27/2014    No results found.  Assessment & Plan:   Diagnoses and all orders for this visit:  Essential hypertension  Atherosclerosis of native coronary artery of native heart without angina pectoris  Type 2 diabetes mellitus with diabetic chronic kidney disease  Need for influenza vaccination -     Flu Vaccine QUAD 36+ mos IM  Other orders -     canagliflozin (INVOKANA) 100 MG TABS tablet; Take 0.5 tablets (50 mg total) by mouth daily.   I have discontinued Jeff Burgess's carvedilol. I have also changed his canagliflozin. Additionally, I am having him maintain his aspirin, clotrimazole-betamethasone, triamcinolone ointment, cholecalciferol, glucose blood, Glucosamine-Chondroitin (MOVE FREE PO), mupirocin ointment, losartan-hydrochlorothiazide, amLODipine, metFORMIN, and lovastatin.  Meds ordered this encounter  Medications  . canagliflozin (INVOKANA) 100 MG TABS tablet    Sig: Take 0.5 tablets (50 mg total) by mouth daily.    Dispense:  30 tablet    Refill:  11     Follow-up: Return in about 3 months (around 12/30/2014) for a follow-up visit.  Walker Kehr, MD

## 2014-09-30 NOTE — Assessment & Plan Note (Addendum)
2016 stress test ok ASA, Amlodipine, Lovastatin

## 2014-09-30 NOTE — Assessment & Plan Note (Signed)
Coreg intolerant - d/c'd

## 2015-01-01 ENCOUNTER — Encounter: Payer: Self-pay | Admitting: Internal Medicine

## 2015-01-01 ENCOUNTER — Ambulatory Visit (INDEPENDENT_AMBULATORY_CARE_PROVIDER_SITE_OTHER): Payer: Commercial Managed Care - HMO | Admitting: Internal Medicine

## 2015-01-01 VITALS — BP 130/74 | HR 84 | Temp 98.5°F | Wt 204.0 lb

## 2015-01-01 DIAGNOSIS — I2510 Atherosclerotic heart disease of native coronary artery without angina pectoris: Secondary | ICD-10-CM | POA: Diagnosis not present

## 2015-01-01 DIAGNOSIS — E1122 Type 2 diabetes mellitus with diabetic chronic kidney disease: Secondary | ICD-10-CM

## 2015-01-01 DIAGNOSIS — I10 Essential (primary) hypertension: Secondary | ICD-10-CM | POA: Diagnosis not present

## 2015-01-01 DIAGNOSIS — N183 Chronic kidney disease, stage 3 (moderate): Secondary | ICD-10-CM

## 2015-01-01 DIAGNOSIS — K1120 Sialoadenitis, unspecified: Secondary | ICD-10-CM | POA: Insufficient documentation

## 2015-01-01 NOTE — Assessment & Plan Note (Signed)
ASA, Amlodipine, Lovastatin

## 2015-01-01 NOTE — Progress Notes (Signed)
Subjective:  Patient ID: Jeff Burgess, male    DOB: [birth year]  Age: 79 y.o. MRN: [id number]  CC: No chief complaint on file.   HPI Jeff Burgess presents for HTN, CAD, dyslipidemia f/u. C/o R jaw swelling a couple times while eating. C/o B hip pain w/walking  Outpatient Prescriptions Prior to Visit  Medication Sig Dispense Refill  . amLODipine (NORVASC) 10 MG tablet TAKE ONE TABLET BY MOUTH ONCE DAILY 90 tablet 3  . aspirin 81 MG tablet Take 81 mg by mouth daily.      . canagliflozin (INVOKANA) 100 MG TABS tablet Take 0.5 tablets (50 mg total) by mouth daily. 30 tablet 11  . Cholecalciferol (VITAMIN D3) 1000 UNITS tablet Take 1,000 Units by mouth daily.      . clotrimazole-betamethasone (LOTRISONE) cream Apply 1 application topically as needed.     . Glucosamine-Chondroitin (MOVE FREE PO) Take by mouth 2 (two) times daily.    [name] glucose blood (ONE TOUCH ULTRA TEST) test strip check your blood sugar 1 time a day.  vary the time of day when you check, between before the 3 meals, and at bedtime. 100 each 12  . losartan-hydrochlorothiazide (HYZAAR) 100-25 MG per tablet TAKE ONE TABLET BY MOUTH EVERY DAY 90 tablet 3  . lovastatin (MEVACOR) 40 MG tablet TAKE ONE TABLET BY MOUTH AT BEDTIME 90 tablet 3  . metFORMIN (GLUCOPHAGE-XR) 500 MG 24 hr tablet TAKE ONE TABLET BY MOUTH TWICE DAILY 180 tablet 3  . mupirocin ointment (BACTROBAN) 2 % Use bid 30 g 0  . triamcinolone (KENALOG) 0.1 % ointment Apply 1 application topically as needed.      No facility-administered medications prior to visit.    ROS Review of Systems  Constitutional: Negative for appetite change, fatigue and unexpected weight change.  HENT: Negative for congestion, nosebleeds, sneezing, sore throat and trouble swallowing.   Eyes: Negative for itching and visual disturbance.  Respiratory: Negative for cough.   Cardiovascular: Negative for chest pain, palpitations and leg swelling.  Gastrointestinal: Negative for nausea,  diarrhea, blood in stool and abdominal distention.  Genitourinary: Negative for frequency and hematuria.  Musculoskeletal: Positive for arthralgias. Negative for back pain, joint swelling, gait problem and neck pain.  Skin: Negative for rash.  Neurological: Negative for dizziness, tremors, speech difficulty and weakness.  Psychiatric/Behavioral: Negative for suicidal ideas, sleep disturbance, dysphoric mood and agitation. The patient is not nervous/anxious.     Objective:  BP 130/74 mmHg  Pulse 84  Temp(Src) 98.5 F (36.9 C) (Oral)  Wt 204 lb (92.534 kg)  SpO2 97%  BP Readings from Last 3 Encounters:  01/01/15 130/74  09/30/14 156/82  07/12/14 140/76    Wt Readings from Last 3 Encounters:  01/01/15 204 lb (92.534 kg)  09/30/14 204 lb (92.534 kg)  07/12/14 206 lb (93.441 kg)    Physical Exam  Constitutional: He is oriented to person, place, and time. He appears well-developed. No distress.  NAD  HENT:  Mouth/Throat: Oropharynx is clear and moist.  Eyes: Conjunctivae are normal. Pupils are equal, round, and reactive to light.  Neck: Normal range of motion. No JVD present. No thyromegaly present.  Cardiovascular: Normal rate, regular rhythm, normal heart sounds and intact distal pulses.  Exam reveals no gallop and no friction rub.   No murmur heard. Pulmonary/Chest: Effort normal and breath sounds normal. No respiratory distress. He has no wheezes. He has no rales. He exhibits no tenderness.  Abdominal: Soft. Bowel sounds are normal. He exhibits  no distension and no mass. There is no tenderness. There is no rebound and no guarding.  Musculoskeletal: Normal range of motion. He exhibits tenderness. He exhibits no edema.  Lymphadenopathy:    He has no cervical adenopathy.  Neurological: He is alert and oriented to person, place, and time. He has normal reflexes. No cranial nerve deficit. He exhibits normal muscle tone. He displays a negative Romberg sign. Coordination and gait  normal.  Skin: Skin is warm and dry. No rash noted.  Psychiatric: He has a normal mood and affect. His behavior is normal. Judgment and thought content normal.    Lab Results  Component Value Date   WBC 10.3 10/03/2012   HGB 15.8 10/03/2012   HCT 46.0 10/03/2012   PLT 224.0 10/03/2012   GLUCOSE 155* 09/27/2014   CHOL 122 10/03/2012   TRIG 166.0* 10/03/2012   HDL 29.20* 10/03/2012   LDLDIRECT 78.4 11/02/2011   LDLCALC 60 10/03/2012   ALT 19 10/03/2012   AST 17 10/03/2012   NA 138 09/27/2014   K 4.1 09/27/2014   CL 100 09/27/2014   CREATININE 1.55* 09/27/2014   BUN 24* 09/27/2014   CO2 27 09/27/2014   TSH 1.53 10/03/2012   PSA 1.48 10/03/2012   HGBA1C 7.1* 09/27/2014    No results found.  Assessment & Plan:   There are no diagnoses linked to this encounter. I am having Jeff Burgess maintain his aspirin, clotrimazole-betamethasone, triamcinolone ointment, cholecalciferol, glucose blood, Glucosamine-Chondroitin (MOVE FREE PO), mupirocin ointment, losartan-hydrochlorothiazide, amLODipine, metFORMIN, lovastatin, and canagliflozin.  No orders of the defined types were placed in this encounter.     Follow-up: No Follow-up on file.  Walker Kehr, MD

## 2015-01-01 NOTE — Assessment & Plan Note (Signed)
R parotid - better Lemon and water Abx if pain

## 2015-01-01 NOTE — Assessment & Plan Note (Signed)
On Metformin   invokana to 1/2 tab/d, drink more water   Labs

## 2015-01-01 NOTE — Patient Instructions (Signed)
Salivary Stone A salivary stone is a mineral deposit that builds up in the ducts that drain your salivary glands. Most salivary gland stones are made of calcium. When a stone forms, saliva can back up into the gland and cause painful swelling. Your salivary glands are the glands that produce spit (saliva). You have six major salivary glands. Each gland has a duct that carries saliva into your mouth. Saliva keeps your mouth moist and breaks down the food that you eat. It also helps to prevent tooth decay. Two salivary glands are located just in front of your ears (parotid). The ducts for these glands open up inside your cheeks, near your back teeth. You also have two glands under your tongue (sublingual) and two glands under your jaw (submandibular). The ducts for these glands open under your tongue. A stone can form in any salivary gland. The most common place for a salivary stone to develop is in a submandibular salivary gland. CAUSES Any condition that reduces the flow of saliva may lead to stone formation. It is not known why some people form stones and others do not.  RISK FACTORS You may be more likely to develop a salivary stone if you:  Are male.  Do not drink enough water.  Smoke.  Have high blood pressure.  Have gout.  Have diabetes. SIGNS AND SYMPTOMS The main sign of a salivary gland stone is sudden swelling of a salivary gland when eating. This usually happens under the jaw on one side. Other signs and symptoms include:  Swelling of the cheek or under the tongue when eating.  Pain in the swollen area.  Trouble chewing or swallowing.  Swelling that goes down after eating. DIAGNOSIS Your health care provider may diagnose a salivary gland stone based on your signs and symptoms. The health care provider will also do a physical exam. In many cases, a stone can be felt in a duct inside your mouth. You may need to see an ear, nose, and throat specialist (ENT or otolaryngologist)  for diagnosis and treatment. You may also need to have diagnostic tests. These may include imaging studies to check for a stone, such as:  X-rays.  Ultrasound.  CT scan.  MRI. TREATMENT Home care may be enough to treat a small stone that is not causing symptoms. Treatment of a stone that is large enough to cause symptoms may include:  Probing and widening the duct to allow the stone to pass.  Inserting a thin, flexible scope (endoscope) into the duct to locate and remove the stone.  Breaking up the stone with sound waves.  Removing the entire salivary gland. HOME CARE INSTRUCTIONS  Drink enough fluid to keep your urine clear or pale yellow.  Follow these instructions every few hours:  Suck on a lemon candy to stimulate the flow of saliva.  Put a hot compress over the gland.  Gently massage the gland.  Do not use any tobacco products, including cigarettes, chewing tobacco, or electronic cigarettes. If you need help quitting, ask your health care provider. SEEK MEDICAL CARE IF:  You have pain and swelling in your face, jaw, or mouth after eating.  You have persistent swelling in any of these places:  In front of your ear.  Under your jaw.  Inside your mouth. SEEK IMMEDIATE MEDICAL CARE IF:  You have pain and swelling in your face, jaw, or mouth that are getting worse.  Your pain and swelling make it hard to swallow or breathe.     This information is not intended to replace advice given to you by your health care provider. Make sure you discuss any questions you have with your health care provider.   Document Released: 02/05/2004 Document Revised: 01/18/2014 Document Reviewed: 05/30/2013 Elsevier Interactive Patient Education 2016 [employer]  

## 2015-01-01 NOTE — Assessment & Plan Note (Signed)
Norvasc, Hyzaar

## 2015-01-01 NOTE — Progress Notes (Signed)
Pre visit review using our clinic review tool, if applicable. No additional management support is needed unless otherwise documented below in the visit note. 

## 2015-01-30 ENCOUNTER — Ambulatory Visit: Payer: Commercial Managed Care - HMO | Admitting: Internal Medicine

## 2015-03-17 ENCOUNTER — Other Ambulatory Visit: Payer: Self-pay

## 2015-03-17 ENCOUNTER — Telehealth: Payer: Self-pay | Admitting: Internal Medicine

## 2015-03-17 MED ORDER — LOSARTAN POTASSIUM-HCTZ 100-25 MG PO TABS: 1.0000 | ORAL_TABLET | Freq: Every day | ORAL | Status: DC

## 2015-03-17 NOTE — Telephone Encounter (Signed)
rx sent to pharm

## 2015-03-17 NOTE — Telephone Encounter (Signed)
Patient is requesting a refill of losartan-hydrochlorothiazide (HYZAAR) 100-25 MG per tablet [id number] sent to [facility]   He states that he is totally out of the medication.

## 2015-04-10 ENCOUNTER — Telehealth: Payer: Self-pay | Admitting: Internal Medicine

## 2015-04-10 ENCOUNTER — Encounter (HOSPITAL_COMMUNITY): Payer: Self-pay | Admitting: *Deleted

## 2015-04-10 ENCOUNTER — Emergency Department (HOSPITAL_COMMUNITY)
Admission: EM | Admit: 2015-04-10 | Discharge: 2015-04-10 | Disposition: A | Discharge status: Home / Self Care | Payer: Commercial Managed Care - HMO | Attending: Emergency Medicine | Admitting: Emergency Medicine

## 2015-04-10 DIAGNOSIS — Z951 Presence of aortocoronary bypass graft: Secondary | ICD-10-CM | POA: Diagnosis not present

## 2015-04-10 DIAGNOSIS — E785 Hyperlipidemia, unspecified: Secondary | ICD-10-CM | POA: Diagnosis not present

## 2015-04-10 DIAGNOSIS — M1990 Unspecified osteoarthritis, unspecified site: Secondary | ICD-10-CM | POA: Diagnosis not present

## 2015-04-10 DIAGNOSIS — Z87891 Personal history of nicotine dependence: Secondary | ICD-10-CM | POA: Diagnosis not present

## 2015-04-10 DIAGNOSIS — R319 Hematuria, unspecified: Secondary | ICD-10-CM | POA: Diagnosis not present

## 2015-04-10 DIAGNOSIS — Z792 Long term (current) use of antibiotics: Secondary | ICD-10-CM | POA: Insufficient documentation

## 2015-04-10 DIAGNOSIS — I10 Essential (primary) hypertension: Secondary | ICD-10-CM | POA: Insufficient documentation

## 2015-04-10 DIAGNOSIS — Z7984 Long term (current) use of oral hypoglycemic drugs: Secondary | ICD-10-CM | POA: Diagnosis not present

## 2015-04-10 DIAGNOSIS — Z7982 Long term (current) use of aspirin: Secondary | ICD-10-CM | POA: Insufficient documentation

## 2015-04-10 DIAGNOSIS — Z872 Personal history of diseases of the skin and subcutaneous tissue: Secondary | ICD-10-CM | POA: Insufficient documentation

## 2015-04-10 DIAGNOSIS — Z79899 Other long term (current) drug therapy: Secondary | ICD-10-CM | POA: Insufficient documentation

## 2015-04-10 DIAGNOSIS — Z87438 Personal history of other diseases of male genital organs: Secondary | ICD-10-CM | POA: Diagnosis not present

## 2015-04-10 DIAGNOSIS — Z87448 Personal history of other diseases of urinary system: Secondary | ICD-10-CM | POA: Diagnosis not present

## 2015-04-10 DIAGNOSIS — I2510 Atherosclerotic heart disease of native coronary artery without angina pectoris: Secondary | ICD-10-CM | POA: Diagnosis not present

## 2015-04-10 DIAGNOSIS — Z8719 Personal history of other diseases of the digestive system: Secondary | ICD-10-CM | POA: Insufficient documentation

## 2015-04-10 DIAGNOSIS — E119 Type 2 diabetes mellitus without complications: Secondary | ICD-10-CM | POA: Insufficient documentation

## 2015-04-10 LAB — URINALYSIS, ROUTINE W REFLEX MICROSCOPIC
Bilirubin Urine: NEGATIVE
Ketones, ur: 15 mg/dL — AB
Nitrite: NEGATIVE
SPECIFIC GRAVITY, URINE: 1.024 (ref 1.005–1.030)
pH: 6 (ref 5.0–8.0)

## 2015-04-10 LAB — BASIC METABOLIC PANEL
Anion gap: 8 (ref 5–15)
BUN: 25 mg/dL — ABNORMAL HIGH (ref 6–20)
CALCIUM: 9.5 mg/dL (ref 8.9–10.3)
CO2: 27 mmol/L (ref 22–32)
Chloride: 103 mmol/L (ref 101–111)
Creatinine, Ser: 1.93 mg/dL — ABNORMAL HIGH (ref 0.61–1.24)
GFR, EST AFRICAN AMERICAN: 37 mL/min — AB (ref >60)
GFR, EST NON AFRICAN AMERICAN: 32 mL/min — AB (ref >60)
GLUCOSE: 142 mg/dL — AB (ref 65–99)
POTASSIUM: 3.9 mmol/L (ref 3.5–5.1)
Sodium: 138 mmol/L (ref 135–145)

## 2015-04-10 LAB — CBC
HEMATOCRIT: 44.8 % (ref 39.0–52.0)
HEMOGLOBIN: 15.3 g/dL (ref 13.0–17.0)
MCH: 29.4 pg (ref 26.0–34.0)
MCHC: 34.2 g/dL (ref 30.0–36.0)
MCV: 86 fL (ref 78.0–100.0)
PLATELETS: 209 10*3/uL (ref 150–400)
RBC: 5.21 MIL/uL (ref 4.22–5.81)
RDW: 12.6 % (ref 11.5–15.5)
WBC: 7.1 10*3/uL (ref 4.0–10.5)

## 2015-04-10 LAB — URINE MICROSCOPIC-ADD ON

## 2015-04-10 MED ORDER — CIPROFLOXACIN HCL 500 MG PO TABS: 500.0000 mg | ORAL_TABLET | Freq: Two times a day (BID) | ORAL | Status: DC

## 2015-04-10 MED ORDER — SODIUM CHLORIDE 0.9 % IV BOLUS (SEPSIS)
1000.0000 mL | Freq: Once | INTRAVENOUS | Status: AC
  Administered 2015-04-10: 1000 mL via INTRAVENOUS

## 2015-04-10 MED ORDER — DEXTROSE 5 % IV SOLN
1.0000 g | Freq: Once | INTRAVENOUS | Status: AC
  Administered 2015-04-10: 1 g via INTRAVENOUS
  Filled 2015-04-10: qty 10

## 2015-04-10 NOTE — ED Provider Notes (Signed)
CSN: [id number]     Arrival date & time 04/10/15  1753 History   First MD Initiated Contact with Patient 04/10/15 1911     Chief Complaint  Patient presents with  . Hematuria     (Consider location/radiation/quality/duration/timing/severity/associated sxs/prior Treatment) HPI   Jeff Burgess is a(n) 80 y.o. male who presents for c/o gross hematuria.  He has a past medical history of diabetes and takes [facility]. Patient states that about a week ago he noticed a little bit of blood in his urine. That seemed to go away. Today, the patient was urinating and had bright red blood. He states "like I was urinating pure blood. " States that he has never had anything like this before. He denies other urinary symptoms except for frequent urination, which she said is normal with the [place]. He has a past medical history of smoking during his teen years but quit in the early 70s. He denies fevers, chills, nausea, vomiting, symptoms of presyncope, shortness of breath, weakness. He denies back pain. Past Medical History  Diagnosis Date  . CAD (coronary artery disease)   . Hyperlipemia   . Hypertension   . OA (osteoarthritis)   . LBP (low back pain)   . Renal insufficiency   . Diabetes mellitus, type 2 ([place])   . ED (erectile dysfunction)   . AK (actinic keratosis)   . Ventral hernia     midline   Past Surgical History  Procedure Laterality Date  . Coronary artery bypass graft  1998  . Tonsillectomy    . Colonoscopy     Family History  Problem Relation Age of Onset  . Hypertension Other   . Arthritis Mother 66    RA  . Alcohol abuse Father 68    exposure  . Diabetes Brother   . Kidney disease Brother   . Heart disease Brother   . Colon cancer Neg Hx   . Esophageal cancer Neg Hx   . Rectal cancer Neg Hx   . Stomach cancer Neg Hx    Social History  Substance Use Topics  . Smoking status: Former Smoker    Types: Cigarettes    Quit date: 01/18/1971  . Smokeless tobacco: Current  User    Types: Chew  . Alcohol Use: No    Review of Systems  Ten systems reviewed and are negative for acute change, except as noted in the HPI.    Allergies  Coreg and Morphine sulfate  Home Medications   Prior to Admission medications   Medication Sig Start Date End Date Taking? Authorizing Provider  amLODipine (NORVASC) 10 MG tablet TAKE ONE TABLET BY MOUTH ONCE DAILY 05/06/14   Lew Dawes V, MD  aspirin 81 MG tablet Take 81 mg by mouth daily.      Historical Provider, MD  canagliflozin (INVOKANA) 100 MG TABS tablet Take 0.5 tablets (50 mg total) by mouth daily. 09/30/14   Aleksei Plotnikov V, MD  Cholecalciferol (VITAMIN D3) 1000 UNITS tablet Take 1,000 Units by mouth daily.      Historical Provider, MD  clotrimazole-betamethasone (LOTRISONE) cream Apply 1 application topically as needed.     Historical Provider, MD  Glucosamine-Chondroitin (MOVE FREE PO) Take by mouth 2 (two) times daily.    Historical Provider, MD  glucose blood (ONE TOUCH ULTRA TEST) test strip check your blood sugar 1 time a day.  vary the time of day when you check, between before the 3 meals, and at bedtime. 04/14/10   Renato Shin,  MD  losartan-hydrochlorothiazide (HYZAAR) 100-25 MG tablet Take 1 tablet by mouth daily. 03/17/15   Aleksei Plotnikov V, MD  lovastatin (MEVACOR) 40 MG tablet TAKE ONE TABLET BY MOUTH AT BEDTIME 07/16/14   Aleksei Plotnikov V, MD  metFORMIN (GLUCOPHAGE-XR) 500 MG 24 hr tablet TAKE ONE TABLET BY MOUTH TWICE DAILY 05/13/14   Cassandria Anger, MD  mupirocin ointment (BACTROBAN) 2 % Use bid 02/06/13   Aleksei Plotnikov V, MD  triamcinolone (KENALOG) 0.1 % ointment Apply 1 application topically as needed.     Historical Provider, MD   BP 143/77 mmHg  Pulse 81  Temp(Src) 97.9 F (36.6 C) (Oral)  Resp 18  SpO2 98% Physical Exam  Constitutional: He appears well-developed and well-nourished. No distress.  HENT:  Head: Normocephalic and atraumatic.  Eyes: Conjunctivae are normal. No  scleral icterus.  Neck: Normal range of motion. Neck supple.  Cardiovascular: Normal rate, regular rhythm and normal heart sounds.   Pulmonary/Chest: Effort normal and breath sounds normal. No respiratory distress.  Abdominal: Soft. He exhibits no distension. There is no tenderness.  No CVA tenderness  Musculoskeletal: He exhibits no edema.  Neurological: He is alert.  Skin: Skin is warm and dry. He is not diaphoretic.  Psychiatric: His behavior is normal.  Nursing note and vitals reviewed.   ED Course  Procedures (including critical care time) Labs Review Labs Reviewed  URINALYSIS, ROUTINE W REFLEX MICROSCOPIC (NOT AT [facility]) - Abnormal; Notable for the following:    Color, Urine RED (*)    APPearance CLOUDY (*)    Glucose, UA >1000 (*)    Hgb urine dipstick LARGE (*)    Ketones, ur 15 (*)    Protein, ur >300 (*)    Leukocytes, UA TRACE (*)    All other components within normal limits  BASIC METABOLIC PANEL - Abnormal; Notable for the following:    Glucose, Bld 142 (*)    BUN 25 (*)    Creatinine, Ser 1.93 (*)    GFR calc non Af Amer 32 (*)    GFR calc Af Amer 37 (*)    All other components within normal limits  URINE MICROSCOPIC-ADD ON - Abnormal; Notable for the following:    Squamous Epithelial / LPF 0-5 (*)    Bacteria, UA MANY (*)    All other components within normal limits  CBC    Imaging Review No results found. I have personally reviewed and evaluated these images and lab results as part of my medical decision-making.   EKG Interpretation None      MDM   Final diagnoses:  Hematuria    Patient with gross hematuria. He has a slight bump in his creatinine. Patient given fluids and IV Rocephin. Will discharge with Cipro. His urine is equivocal for UTI. I have sent for culture. Patient is to follow up with [facility] for further testing. He is follow-up with his PCP in 3-5 days for repeat creatinine study. Patient understands and agrees with plan of  care. He is not symptomatic expresses no concern for presyncope, racing heart, or feelings of weakness. Patient seen in shared visit with attending physician.     Margarita Mail, PA-C 04/11/15 [id number]  Sherwood Gambler, MD 04/15/15 1140

## 2015-04-10 NOTE — Discharge Instructions (Signed)
Please make sure to get you kidney function (creatinine) rechecked in 3-5 days with your primary care doctor. Follow up with the Urologist!    Hematuria, Adult Hematuria is blood in your urine. It can be caused by a bladder infection, kidney infection, prostate infection, kidney stone, or cancer of your urinary tract. Infections can usually be treated with medicine, and a kidney stone usually will pass through your urine. If neither of these is the cause of your hematuria, further workup to find out the reason may be needed. It is very important that you tell your health care provider about any blood you see in your urine, even if the blood stops without treatment or happens without causing pain. Blood in your urine that happens and then stops and then happens again can be a symptom of a very serious condition. Also, pain is not a symptom in the initial stages of many urinary cancers. HOME CARE INSTRUCTIONS   Drink lots of fluid, 3-4 quarts a day. If you have been diagnosed with an infection, cranberry juice is especially recommended, in addition to large amounts of water.  Avoid caffeine, tea, and carbonated beverages because they tend to irritate the bladder.  Avoid alcohol because it may irritate the prostate.  Take all medicines as directed by your health care provider.  If you were prescribed an antibiotic medicine, finish it all even if you start to feel better.  If you have been diagnosed with a kidney stone, follow your health care provider's instructions regarding straining your urine to catch the stone.  Empty your bladder often. Avoid holding urine for long periods of time.  After a bowel movement, women should cleanse front to back. Use each tissue only once.  Empty your bladder before and after sexual intercourse if you are a male. SEEK MEDICAL CARE IF:  You develop back pain.  You have a fever.  You have a feeling of sickness in your stomach (nausea) or  vomiting.  Your symptoms are not better in 3 days. Return sooner if you are getting worse. SEEK IMMEDIATE MEDICAL CARE IF:   You develop severe vomiting and are unable to keep the medicine down.  You develop severe back or abdominal pain despite taking your medicines.  You begin passing a large amount of blood or clots in your urine.  You feel extremely weak or faint, or you pass out. MAKE SURE YOU:   Understand these instructions.  Will watch your condition.  Will get help right away if you are not doing well or get worse.   This information is not intended to replace advice given to you by your health care provider. Make sure you discuss any questions you have with your health care provider.   Document Released: 12/28/2004 Document Revised: 01/18/2014 Document Reviewed: 08/28/2012 Elsevier Interactive Patient Education [employer].

## 2015-04-10 NOTE — Telephone Encounter (Signed)
Pt called stating he just went to the bathroom and he had blood in his urine. He's a little concerned because it was "almost all blood" I did send him to the [facility] nurse line.  Can you please give him a call.

## 2015-04-10 NOTE — ED Notes (Signed)
Pt here with hematuria that happened 2 weeks ago and then happened again today. No abdominal pain. Pt takes asa everyday

## 2015-04-11 NOTE — Telephone Encounter (Signed)
[facility] sent pt to the ER and he has a follow up appt with Dr. Camila Li on [facility]

## 2015-04-12 LAB — URINE CULTURE

## 2015-04-15 DIAGNOSIS — R310 Gross hematuria: Secondary | ICD-10-CM | POA: Diagnosis not present

## 2015-04-15 DIAGNOSIS — Z0000 Encounter for general adult medical examination without abnormal findings: Secondary | ICD-10-CM | POA: Diagnosis not present

## 2015-04-15 DIAGNOSIS — N400 Enlarged prostate without lower urinary tract symptoms: Secondary | ICD-10-CM | POA: Diagnosis not present

## 2015-04-16 ENCOUNTER — Ambulatory Visit: Payer: Commercial Managed Care - HMO | Admitting: Internal Medicine

## 2015-04-29 DIAGNOSIS — R310 Gross hematuria: Secondary | ICD-10-CM | POA: Diagnosis not present

## 2015-04-29 DIAGNOSIS — K8020 Calculus of gallbladder without cholecystitis without obstruction: Secondary | ICD-10-CM | POA: Diagnosis not present

## 2015-04-29 DIAGNOSIS — N400 Enlarged prostate without lower urinary tract symptoms: Secondary | ICD-10-CM | POA: Diagnosis not present

## 2015-05-05 ENCOUNTER — Encounter: Payer: Self-pay | Admitting: Internal Medicine

## 2015-05-05 ENCOUNTER — Ambulatory Visit (INDEPENDENT_AMBULATORY_CARE_PROVIDER_SITE_OTHER): Payer: Commercial Managed Care - HMO | Admitting: Internal Medicine

## 2015-05-05 ENCOUNTER — Other Ambulatory Visit (INDEPENDENT_AMBULATORY_CARE_PROVIDER_SITE_OTHER): Payer: Commercial Managed Care - HMO

## 2015-05-05 VITALS — BP 120/82 | HR 78 | Temp 97.7°F | Ht 69.0 in | Wt 203.0 lb

## 2015-05-05 DIAGNOSIS — M5440 Lumbago with sciatica, unspecified side: Secondary | ICD-10-CM

## 2015-05-05 DIAGNOSIS — I10 Essential (primary) hypertension: Secondary | ICD-10-CM

## 2015-05-05 DIAGNOSIS — N183 Chronic kidney disease, stage 3 unspecified: Secondary | ICD-10-CM

## 2015-05-05 DIAGNOSIS — G8929 Other chronic pain: Secondary | ICD-10-CM

## 2015-05-05 DIAGNOSIS — E291 Testicular hypofunction: Secondary | ICD-10-CM

## 2015-05-05 DIAGNOSIS — E785 Hyperlipidemia, unspecified: Secondary | ICD-10-CM

## 2015-05-05 DIAGNOSIS — E1122 Type 2 diabetes mellitus with diabetic chronic kidney disease: Secondary | ICD-10-CM

## 2015-05-05 LAB — BASIC METABOLIC PANEL
BUN: 19 mg/dL (ref 6–23)
CALCIUM: 10.1 mg/dL (ref 8.4–10.5)
CHLORIDE: 102 meq/L (ref 96–112)
CO2: 30 meq/L (ref 19–32)
CREATININE: 1.29 mg/dL (ref 0.40–1.50)
GFR: 57.23 mL/min — ABNORMAL LOW (ref >60.00)
GLUCOSE: 163 mg/dL — AB (ref 70–99)
Potassium: 4.1 mEq/L (ref 3.5–5.1)
Sodium: 140 mEq/L (ref 135–145)

## 2015-05-05 LAB — HEMOGLOBIN A1C: Hgb A1c MFr Bld: 7.3 % — ABNORMAL HIGH (ref 4.6–6.5)

## 2015-05-05 NOTE — Progress Notes (Signed)
Pre visit review using our clinic review tool, if applicable. No additional management support is needed unless otherwise documented below in the visit note. 

## 2015-05-05 NOTE — Assessment & Plan Note (Signed)
Insulin option discussed

## 2015-05-05 NOTE — Assessment & Plan Note (Signed)
On Lovastatin

## 2015-05-05 NOTE — Progress Notes (Signed)
Subjective:  Patient ID: Jeff Burgess, male    DOB: 04-12-1935  Age: 80 y.o. MRN: [id number]  CC: Follow-up   HPI Jeff Burgess presents for HTN, DM, hematuria on 04/10/15. Pt had a CT w/Urology 55f/u on 05/12/15). Pt stopped Invokana...    Outpatient Prescriptions Prior to Visit  Medication Sig Dispense Refill  . amLODipine (NORVASC) 10 MG tablet TAKE ONE TABLET BY MOUTH ONCE DAILY 90 tablet 3  . aspirin 81 MG tablet Take 81 mg by mouth daily.      . Cholecalciferol (VITAMIN D3) 1000 UNITS tablet Take 1,000 Units by mouth daily.      . ciprofloxacin (CIPRO) 500 MG tablet Take 1 tablet (500 mg total) by mouth 2 (two) times daily. 14 tablet 0  . Glucosamine-Chondroitin (MOVE FREE PO) Take by mouth 2 (two) times daily.    [name] glucose blood (ONE TOUCH ULTRA TEST) test strip check your blood sugar 1 time a day.  vary the time of day when you check, between before the 3 meals, and at bedtime. 100 each 12  . losartan-hydrochlorothiazide (HYZAAR) 100-25 MG tablet Take 1 tablet by mouth daily. 90 tablet 1  . lovastatin (MEVACOR) 40 MG tablet TAKE ONE TABLET BY MOUTH AT BEDTIME 90 tablet 3  . metFORMIN (GLUCOPHAGE-XR) 500 MG 24 hr tablet TAKE ONE TABLET BY MOUTH TWICE DAILY 180 tablet 3  . mupirocin ointment (BACTROBAN) 2 % Use bid 30 g 0  . canagliflozin (INVOKANA) 100 MG TABS tablet Take 0.5 tablets (50 mg total) by mouth daily. (Patient not taking: Reported on 05/05/2015) 30 tablet 11   No facility-administered medications prior to visit.    ROS Review of Systems  Constitutional: Negative for appetite change, fatigue and unexpected weight change.  HENT: Negative for congestion, nosebleeds, sneezing, sore throat and trouble swallowing.   Eyes: Negative for itching and visual disturbance.  Respiratory: Negative for cough.   Cardiovascular: Negative for chest pain, palpitations and leg swelling.  Gastrointestinal: Negative for nausea, diarrhea, blood in stool and abdominal distention.    Genitourinary: Positive for hematuria. Negative for frequency.  Musculoskeletal: Positive for back pain. Negative for joint swelling, gait problem and neck pain.  Skin: Negative for rash.  Neurological: Negative for dizziness, tremors, speech difficulty and weakness.  Psychiatric/Behavioral: Negative for sleep disturbance, dysphoric mood and agitation. The patient is not nervous/anxious.     Objective:  BP 120/82 mmHg  Pulse 78  Temp(Src) 97.7 F (36.5 C) (Oral)  Ht 5\' 9"  (1.753 m)  Wt 203 lb (92.08 kg)  BMI 29.96 kg/m2  SpO2 98%  BP Readings from Last 3 Encounters:  05/05/15 120/82  04/10/15 142/81  01/01/15 130/74    Wt Readings from Last 3 Encounters:  05/05/15 203 lb (92.08 kg)  01/01/15 204 lb (92.534 kg)  09/30/14 204 lb (92.534 kg)    Physical Exam  Constitutional: He is oriented to person, place, and time. He appears well-developed. No distress.  NAD  HENT:  Mouth/Throat: Oropharynx is clear and moist.  Eyes: Conjunctivae are normal. Pupils are equal, round, and reactive to light.  Neck: Normal range of motion. No JVD present. No thyromegaly present.  Cardiovascular: Normal rate, regular rhythm, normal heart sounds and intact distal pulses.  Exam reveals no gallop and no friction rub.   No murmur heard. Pulmonary/Chest: Effort normal and breath sounds normal. No respiratory distress. He has no wheezes. He has no rales. He exhibits no tenderness.  Abdominal: Soft. Bowel sounds are normal. He  exhibits no distension and no mass. There is no tenderness. There is no rebound and no guarding.  Musculoskeletal: Normal range of motion. He exhibits tenderness. He exhibits no edema.  Lymphadenopathy:    He has no cervical adenopathy.  Neurological: He is alert and oriented to person, place, and time. He has normal reflexes. No cranial nerve deficit. He exhibits normal muscle tone. He displays a negative Romberg sign. Coordination and gait normal.  Skin: Skin is warm and  dry. No rash noted.  Psychiatric: He has a normal mood and affect. His behavior is normal. Judgment and thought content normal.    Lab Results  Component Value Date   WBC 7.1 04/10/2015   HGB 15.3 04/10/2015   HCT 44.8 04/10/2015   PLT 209 04/10/2015   GLUCOSE 142* 04/10/2015   CHOL 122 10/03/2012   TRIG 166.0* 10/03/2012   HDL 29.20* 10/03/2012   LDLDIRECT 78.4 11/02/2011   LDLCALC 60 10/03/2012   ALT 19 10/03/2012   AST 17 10/03/2012   NA 138 04/10/2015   K 3.9 04/10/2015   CL 103 04/10/2015   CREATININE 1.93* 04/10/2015   BUN 25* 04/10/2015   CO2 27 04/10/2015   TSH 1.53 10/03/2012   PSA 1.48 10/03/2012   HGBA1C 7.1* 09/27/2014    No results found.  Assessment & Plan:   There are no diagnoses linked to this encounter. I am having Mr. Wenger maintain his aspirin, cholecalciferol, glucose blood, Glucosamine-Chondroitin (MOVE FREE PO), mupirocin ointment, amLODipine, metFORMIN, lovastatin, canagliflozin, losartan-hydrochlorothiazide, and ciprofloxacin.  No orders of the defined types were placed in this encounter.     Follow-up: No Follow-up on file.  Walker Kehr, MD

## 2015-05-05 NOTE — Assessment & Plan Note (Signed)
Off rx now

## 2015-05-05 NOTE — Assessment & Plan Note (Signed)
Norvasc, Hyzaar

## 2015-05-05 NOTE — Assessment & Plan Note (Signed)
ASA, Amlodipine, Lovastatin

## 2015-05-06 NOTE — Assessment & Plan Note (Signed)
Not on Rx 

## 2015-05-09 ENCOUNTER — Other Ambulatory Visit: Payer: Self-pay | Admitting: Internal Medicine

## 2015-05-12 DIAGNOSIS — D494 Neoplasm of unspecified behavior of bladder: Secondary | ICD-10-CM | POA: Diagnosis not present

## 2015-05-12 DIAGNOSIS — R310 Gross hematuria: Secondary | ICD-10-CM | POA: Diagnosis not present

## 2015-05-12 DIAGNOSIS — N400 Enlarged prostate without lower urinary tract symptoms: Secondary | ICD-10-CM | POA: Diagnosis not present

## 2015-05-12 DIAGNOSIS — Z0000 Encounter for general adult medical examination without abnormal findings: Secondary | ICD-10-CM | POA: Diagnosis not present

## 2015-05-12 DIAGNOSIS — R918 Other nonspecific abnormal finding of lung field: Secondary | ICD-10-CM | POA: Diagnosis not present

## 2015-05-14 ENCOUNTER — Other Ambulatory Visit: Payer: Self-pay | Admitting: Urology

## 2015-05-19 DIAGNOSIS — R918 Other nonspecific abnormal finding of lung field: Secondary | ICD-10-CM | POA: Diagnosis not present

## 2015-05-22 ENCOUNTER — Other Ambulatory Visit: Payer: Self-pay

## 2015-05-22 MED ORDER — AMLODIPINE BESYLATE 10 MG PO TABS: 10.0000 mg | ORAL_TABLET | Freq: Every day | ORAL | Status: DC

## 2015-05-23 ENCOUNTER — Encounter (HOSPITAL_BASED_OUTPATIENT_CLINIC_OR_DEPARTMENT_OTHER): Payer: Self-pay | Admitting: *Deleted

## 2015-05-23 NOTE — Progress Notes (Signed)
NPO AFTER MN ,  PT VERBALIZED UNDERSTANDING THIS INCLUDED NO DIP TOBACCO.  ARRIVE AT 1030.  NEEDS CBC, BMET, PT/INR, PTT.  CURRENT EKG IN CHART AND EPIC.  WILL TAKE NORVASC AM DOS W/ SIPS OF WATER.

## 2015-05-28 ENCOUNTER — Encounter (HOSPITAL_BASED_OUTPATIENT_CLINIC_OR_DEPARTMENT_OTHER): Payer: Self-pay | Admitting: *Deleted

## 2015-06-02 ENCOUNTER — Encounter (HOSPITAL_BASED_OUTPATIENT_CLINIC_OR_DEPARTMENT_OTHER)
Admission: RE | Disposition: A | Discharge status: Home / Self Care | Payer: Self-pay | Source: Ambulatory Visit | Attending: Urology

## 2015-06-02 ENCOUNTER — Ambulatory Visit (HOSPITAL_BASED_OUTPATIENT_CLINIC_OR_DEPARTMENT_OTHER): Payer: Commercial Managed Care - HMO | Admitting: Anesthesiology

## 2015-06-02 ENCOUNTER — Encounter (HOSPITAL_BASED_OUTPATIENT_CLINIC_OR_DEPARTMENT_OTHER): Payer: Self-pay | Admitting: *Deleted

## 2015-06-02 ENCOUNTER — Ambulatory Visit (HOSPITAL_BASED_OUTPATIENT_CLINIC_OR_DEPARTMENT_OTHER)
Admission: RE | Admit: 2015-06-02 | Discharge: 2015-06-02 | Disposition: A | Discharge status: Home / Self Care | Payer: Commercial Managed Care - HMO | Source: Ambulatory Visit | Attending: Urology | Admitting: Urology

## 2015-06-02 DIAGNOSIS — I2510 Atherosclerotic heart disease of native coronary artery without angina pectoris: Secondary | ICD-10-CM | POA: Insufficient documentation

## 2015-06-02 DIAGNOSIS — D494 Neoplasm of unspecified behavior of bladder: Secondary | ICD-10-CM | POA: Diagnosis not present

## 2015-06-02 DIAGNOSIS — Z87442 Personal history of urinary calculi: Secondary | ICD-10-CM | POA: Diagnosis not present

## 2015-06-02 DIAGNOSIS — Z79899 Other long term (current) drug therapy: Secondary | ICD-10-CM | POA: Insufficient documentation

## 2015-06-02 DIAGNOSIS — E119 Type 2 diabetes mellitus without complications: Secondary | ICD-10-CM | POA: Insufficient documentation

## 2015-06-02 DIAGNOSIS — R918 Other nonspecific abnormal finding of lung field: Secondary | ICD-10-CM | POA: Diagnosis not present

## 2015-06-02 DIAGNOSIS — E7800 Pure hypercholesterolemia, unspecified: Secondary | ICD-10-CM | POA: Diagnosis not present

## 2015-06-02 DIAGNOSIS — C679 Malignant neoplasm of bladder, unspecified: Secondary | ICD-10-CM | POA: Insufficient documentation

## 2015-06-02 DIAGNOSIS — C672 Malignant neoplasm of lateral wall of bladder: Secondary | ICD-10-CM | POA: Diagnosis not present

## 2015-06-02 DIAGNOSIS — N359 Urethral stricture, unspecified: Secondary | ICD-10-CM | POA: Insufficient documentation

## 2015-06-02 DIAGNOSIS — Z7982 Long term (current) use of aspirin: Secondary | ICD-10-CM | POA: Diagnosis not present

## 2015-06-02 DIAGNOSIS — I10 Essential (primary) hypertension: Secondary | ICD-10-CM | POA: Diagnosis not present

## 2015-06-02 DIAGNOSIS — Z87891 Personal history of nicotine dependence: Secondary | ICD-10-CM | POA: Diagnosis not present

## 2015-06-02 DIAGNOSIS — N400 Enlarged prostate without lower urinary tract symptoms: Secondary | ICD-10-CM | POA: Diagnosis not present

## 2015-06-02 DIAGNOSIS — R310 Gross hematuria: Secondary | ICD-10-CM | POA: Diagnosis present

## 2015-06-02 DIAGNOSIS — Z7984 Long term (current) use of oral hypoglycemic drugs: Secondary | ICD-10-CM | POA: Diagnosis not present

## 2015-06-02 HISTORY — DX: Presence of spectacles and contact lenses: Z97.3

## 2015-06-02 HISTORY — DX: Testicular hypofunction: E29.1

## 2015-06-02 HISTORY — DX: Diverticulosis of large intestine without perforation or abscess without bleeding: K57.30

## 2015-06-02 HISTORY — DX: Lumbago with sciatica, unspecified side: M54.40

## 2015-06-02 HISTORY — DX: Complete loss of teeth, unspecified cause, unspecified class: K08.109

## 2015-06-02 HISTORY — PX: TRANSURETHRAL RESECTION OF BLADDER TUMOR WITH GYRUS (TURBT-GYRUS): SHX6458

## 2015-06-02 HISTORY — DX: Presence of dental prosthetic device (complete) (partial): Z97.2

## 2015-06-02 HISTORY — DX: Unspecified right bundle-branch block: I45.10

## 2015-06-02 HISTORY — DX: Personal history of other medical treatment: Z92.89

## 2015-06-02 HISTORY — DX: Other chronic pain: G89.29

## 2015-06-02 HISTORY — DX: Chronic kidney disease, stage 3 unspecified: N18.30

## 2015-06-02 HISTORY — DX: Hyperlipidemia, unspecified: E78.5

## 2015-06-02 HISTORY — DX: Chronic kidney disease, stage 3 (moderate): N18.3

## 2015-06-02 LAB — PROTIME-INR
INR: 1.13 (ref 0.00–1.49)
PROTHROMBIN TIME: 14.2 s (ref 11.6–15.2)

## 2015-06-02 LAB — CBC
HEMATOCRIT: 42.2 % (ref 39.0–52.0)
HEMOGLOBIN: 15.2 g/dL (ref 13.0–17.0)
MCH: 30.2 pg (ref 26.0–34.0)
MCHC: 36 g/dL (ref 30.0–36.0)
MCV: 83.9 fL (ref 78.0–100.0)
Platelets: 204 10*3/uL (ref 150–400)
RBC: 5.03 MIL/uL (ref 4.22–5.81)
RDW: 12.6 % (ref 11.5–15.5)
WBC: 8.5 10*3/uL (ref 4.0–10.5)

## 2015-06-02 LAB — BASIC METABOLIC PANEL
ANION GAP: 6 (ref 5–15)
BUN: 18 mg/dL (ref 6–20)
CALCIUM: 9.4 mg/dL (ref 8.9–10.3)
CHLORIDE: 103 mmol/L (ref 101–111)
CO2: 28 mmol/L (ref 22–32)
Creatinine, Ser: 1.07 mg/dL (ref 0.61–1.24)
GFR calc non Af Amer: >60 mL/min (ref >60)
GLUCOSE: 154 mg/dL — AB (ref 65–99)
POTASSIUM: 4 mmol/L (ref 3.5–5.1)
Sodium: 137 mmol/L (ref 135–145)

## 2015-06-02 LAB — APTT: APTT: 31 s (ref 24–37)

## 2015-06-02 LAB — GLUCOSE, CAPILLARY: GLUCOSE-CAPILLARY: 116 mg/dL — AB (ref 65–99)

## 2015-06-02 SURGERY — TRANSURETHRAL RESECTION OF BLADDER TUMOR WITH GYRUS (TURBT-GYRUS)
Anesthesia: General

## 2015-06-02 MED ORDER — PROPOFOL 500 MG/50ML IV EMUL
INTRAVENOUS | Status: DC | PRN
  Administered 2015-06-02: 150 mL via INTRAVENOUS

## 2015-06-02 MED ORDER — ONDANSETRON HCL 4 MG/2ML IJ SOLN
INTRAMUSCULAR | Status: AC
  Filled 2015-06-02: qty 2

## 2015-06-02 MED ORDER — HYDROCODONE-ACETAMINOPHEN 5-325 MG PO TABS
ORAL_TABLET | ORAL | Status: AC
  Filled 2015-06-02: qty 1

## 2015-06-02 MED ORDER — FENTANYL CITRATE (PF) 100 MCG/2ML IJ SOLN
INTRAMUSCULAR | Status: AC
  Filled 2015-06-02: qty 2

## 2015-06-02 MED ORDER — CEFAZOLIN SODIUM-DEXTROSE 2-4 GM/100ML-% IV SOLN
2.0000 g | INTRAVENOUS | Status: AC
  Administered 2015-06-02: 2 g via INTRAVENOUS
  Filled 2015-06-02: qty 100

## 2015-06-02 MED ORDER — ACETAMINOPHEN 10 MG/ML IV SOLN
INTRAVENOUS | Status: AC
  Filled 2015-06-02: qty 100

## 2015-06-02 MED ORDER — SODIUM CHLORIDE 0.9 % IV SOLN
INTRAVENOUS | Status: DC
  Administered 2015-06-02: 11:00:00 via INTRAVENOUS
  Filled 2015-06-02: qty 1000

## 2015-06-02 MED ORDER — LIDOCAINE HCL (CARDIAC) 20 MG/ML IV SOLN
INTRAVENOUS | Status: AC
  Filled 2015-06-02: qty 5

## 2015-06-02 MED ORDER — LIDOCAINE HCL (CARDIAC) 20 MG/ML IV SOLN
INTRAVENOUS | Status: DC | PRN
  Administered 2015-06-02: 80 mg via INTRAVENOUS

## 2015-06-02 MED ORDER — DEXAMETHASONE SODIUM PHOSPHATE 10 MG/ML IJ SOLN
INTRAMUSCULAR | Status: AC
  Filled 2015-06-02: qty 1

## 2015-06-02 MED ORDER — FENTANYL CITRATE (PF) 100 MCG/2ML IJ SOLN
INTRAMUSCULAR | Status: DC | PRN
  Administered 2015-06-02: 100 ug via INTRAVENOUS

## 2015-06-02 MED ORDER — CEFAZOLIN SODIUM-DEXTROSE 2-4 GM/100ML-% IV SOLN
INTRAVENOUS | Status: AC
  Filled 2015-06-02: qty 100

## 2015-06-02 MED ORDER — HYDROMORPHONE HCL 1 MG/ML IJ SOLN
0.2500 mg | INTRAMUSCULAR | Status: DC | PRN
  Filled 2015-06-02: qty 1

## 2015-06-02 MED ORDER — OXYCODONE HCL 5 MG PO TABS
5.0000 mg | ORAL_TABLET | Freq: Once | ORAL | Status: DC | PRN
  Filled 2015-06-02: qty 1

## 2015-06-02 MED ORDER — STERILE WATER FOR INJECTION IJ SOLN
INTRAMUSCULAR | Status: AC
  Filled 2015-06-02: qty 10

## 2015-06-02 MED ORDER — OXYCODONE HCL 5 MG/5ML PO SOLN
5.0000 mg | Freq: Once | ORAL | Status: DC | PRN
  Filled 2015-06-02: qty 5

## 2015-06-02 MED ORDER — HYDROCODONE-ACETAMINOPHEN 5-325 MG PO TABS: 1.0000 | ORAL_TABLET | Freq: Four times a day (QID) | ORAL | Status: DC | PRN

## 2015-06-02 MED ORDER — KETOROLAC TROMETHAMINE 30 MG/ML IJ SOLN
INTRAMUSCULAR | Status: AC
  Filled 2015-06-02: qty 1

## 2015-06-02 MED ORDER — HYDROCODONE-ACETAMINOPHEN 5-325 MG PO TABS
1.0000 | ORAL_TABLET | Freq: Four times a day (QID) | ORAL | Status: DC | PRN
  Administered 2015-06-02: 1 via ORAL
  Filled 2015-06-02: qty 1

## 2015-06-02 MED ORDER — ACETAMINOPHEN 10 MG/ML IV SOLN
INTRAVENOUS | Status: DC | PRN
  Administered 2015-06-02: 1000 mg via INTRAVENOUS

## 2015-06-02 MED ORDER — MEPERIDINE HCL 25 MG/ML IJ SOLN
6.2500 mg | INTRAMUSCULAR | Status: DC | PRN
  Filled 2015-06-02: qty 1

## 2015-06-02 MED ORDER — ONDANSETRON HCL 4 MG/2ML IJ SOLN
INTRAMUSCULAR | Status: DC | PRN
  Administered 2015-06-02: 4 mg via INTRAVENOUS

## 2015-06-02 MED ORDER — CEPHALEXIN 500 MG PO CAPS: 500.0000 mg | ORAL_CAPSULE | Freq: Three times a day (TID) | ORAL | Status: DC

## 2015-06-02 SURGICAL SUPPLY — 31 items
BAG DRAIN URO-CYSTO SKYTR STRL (DRAIN) ×3 IMPLANT
BAG DRN ANRFLXCHMBR STRAP LEK (BAG)
BAG URINE DRAINAGE (UROLOGICAL SUPPLIES) IMPLANT
BAG URINE LEG 19OZ MD ST LTX (BAG) IMPLANT
BAG URINE LEG 500ML (DRAIN) IMPLANT
CATH FOLEY 2W COUNCIL 20FR 5CC (CATHETERS) ×3 IMPLANT
CATH FOLEY 2WAY SLVR  5CC 22FR (CATHETERS)
CATH FOLEY 2WAY SLVR 30CC 20FR (CATHETERS) IMPLANT
CATH FOLEY 2WAY SLVR 5CC 22FR (CATHETERS) IMPLANT
CATH INTERMIT  6FR 70CM (CATHETERS) ×3 IMPLANT
CLOTH BEACON ORANGE TIMEOUT ST (SAFETY) ×3 IMPLANT
ELECT BIVAP BIPO 22/24 DONUT (ELECTROSURGICAL)
ELECT REM PT RETURN 9FT ADLT (ELECTROSURGICAL) ×3
ELECTRD BIVAP BIPO 22/24 DONUT (ELECTROSURGICAL) IMPLANT
ELECTRODE REM PT RTRN 9FT ADLT (ELECTROSURGICAL) ×1 IMPLANT
EVACUATOR MICROVAS BLADDER (UROLOGICAL SUPPLIES) ×3 IMPLANT
GLOVE BIO SURGEON STRL SZ7.5 (GLOVE) ×3 IMPLANT
GOWN STRL REUS W/ TWL LRG LVL3 (GOWN DISPOSABLE) ×1 IMPLANT
GOWN STRL REUS W/ TWL XL LVL3 (GOWN DISPOSABLE) ×1 IMPLANT
GOWN STRL REUS W/TWL LRG LVL3 (GOWN DISPOSABLE) ×3
GOWN STRL REUS W/TWL XL LVL3 (GOWN DISPOSABLE) ×2
GUIDEWIRE STR DUAL SENSOR (WIRE) ×6 IMPLANT
KIT ROOM TURNOVER WOR (KITS) ×3 IMPLANT
LOOP CUT BIPOLAR 24F LRG (ELECTROSURGICAL) ×3 IMPLANT
MANIFOLD NEPTUNE II (INSTRUMENTS) IMPLANT
PACK CYSTO (CUSTOM PROCEDURE TRAY) ×3 IMPLANT
SET ASPIRATION TUBING (TUBING) IMPLANT
SYRINGE 10CC LL (SYRINGE) ×3 IMPLANT
SYRINGE IRR TOOMEY STRL 70CC (SYRINGE) IMPLANT
TUBE CONNECTING 12'X1/4 (SUCTIONS)
TUBE CONNECTING 12X1/4 (SUCTIONS) IMPLANT

## 2015-06-02 NOTE — Anesthesia Procedure Notes (Signed)
Procedure Name: LMA Insertion Date/Time: 06/02/2015 12:27 PM Performed by: Wanita Chamberlain Pre-anesthesia Checklist: Patient identified, Timeout performed, Emergency Drugs available, Suction available and Patient being monitored Patient Re-evaluated:Patient Re-evaluated prior to inductionOxygen Delivery Method: Circle system utilized Preoxygenation: Pre-oxygenation with 100% oxygen Intubation Type: IV induction Ventilation: Mask ventilation without difficulty LMA: LMA inserted LMA Size: 4.0 Number of attempts: 1 Placement Confirmation: breath sounds checked- equal and bilateral and positive ETCO2 Tube secured with: Tape Dental Injury: Teeth and Oropharynx as per pre-operative assessment

## 2015-06-02 NOTE — Transfer of Care (Signed)
Immediate Anesthesia Transfer of Care Note  Patient: Jeff Burgess  Procedure(s) Performed: Procedure(s): TRANSURETHRAL RESECTION OF BLADDER TUMOR WITH GYRUS (TURBT-GYRUS) (N/A)  Patient Location: PACU  Anesthesia Type:General  Level of Consciousness: awake, alert , oriented and patient cooperative  Airway & Oxygen Therapy: Patient Spontanous Breathing and Patient connected to face mask oxygen  Post-op Assessment: Report given to RN and Post -op Vital signs reviewed and stable  Post vital signs: Reviewed and stable  Last Vitals:  Filed Vitals:   06/02/15 1041  BP: 155/68  Pulse: 64  Temp: 36.4 C  Resp: 16    Last Pain: There were no vitals filed for this visit.    Patients Stated Pain Goal: 5 ([id number])  Complications: No apparent anesthesia complications

## 2015-06-02 NOTE — Discharge Instructions (Signed)
Transurethral Resection, Bladder Tumor A cancerous growth (tumor) can develop on the inside wall of the bladder. The bladder is the organ that holds urine. One way to remove the tumor is a procedure called a transurethral resection. The tumor is removed (resected) through the tube that carries urine from the bladder out of the body (urethra). No cuts (incisions) are made in the skin. Instead, the procedure is done through a thin telescope, called a resectoscope. Attached to it is a light and usually a tiny camera. The resectoscope is put into the urethra. In men, the urethra opens at the end of the penis. In women, it opens just above the vagina.  A transurethral resection is usually used to remove tumors that have not gotten too big or too deep. These are called Stage 0, Stage 1 or Stage 2 bladder cancers. LET YOUR CAREGIVER KNOW ABOUT:  On the day of the procedure, your caregivers will need to know the last time you had anything to eat or drink. This includes water, gum, and candy. In advance, make sure they know about:   Any allergies.  All medications you are taking, including:  Herbs, eyedrops, over-the-counter medications and creams.  Blood thinners (anticoagulants), aspirin or other drugs that could affect blood clotting.  Use of steroids (by mouth or as creams).  Previous problems with anesthetics, including local anesthetics.  Possibility of pregnancy, if this applies.  Any history of blood clots.  Any history of bleeding or other blood problems.  Previous surgery.  Smoking history.  Any recent symptoms of colds or infections.  Other health problems. RISKS AND COMPLICATIONS This is usually a safe procedure. Every procedure has risks, though. For a transurethral resection, they include: 1. Infection. Antibiotic medication would need to be taken. 2. Bleeding.  Light bleeding may last for several days after the procedure.  If bleeding continues or is heavy, the bladder may  need rinsing. Or, a new catheter might be put in for awhile.  Sometimes bed rest is needed. 3. Urination problems.  Pain and burning can occur when urinating. This usually goes away in a few days.  Scarring from the procedure can block the flow of urine. 4. Bladder damage.  It can be punctured or torn during removal of the tumor. If this happens, a catheter might be needed for longer. Antibiotics would be taken while the bladder heals.  Urine can leak through the hole or tear into the abdomen. If this happens, surgery may be needed to repair the bladder. BEFORE THE PROCEDURE  1. A medical evaluation will be done. This may include: 1. A physical examination. 2. Urine test. This is to make sure you do not have a urinary tract infection. 3. Blood tests. 4. A test that checks the heart's rhythm (electrocardiogram). 5. Talking with an anesthesiologist. This is the person who will be in charge of the medication (anesthesia) to keep you from feeling pain during the transurethral resection. You might be asleep during the procedure (general anesthesia) or numb from the waist down, but awake during the procedure (spinal anesthesia). Ask your surgeon what to expect. 2. The person who is having a transurethral resection needs to give what is called informed consent. This requires signing a legal paper that gives permission for the procedure. To give informed consent: 1. You must understand how the procedure is done and why. 2. You must be told all the risks and benefits of the procedure. 3. You must sign the consent. Sometimes a legal guardian  can do this. 4. Signing should be witnessed by a healthcare professional. 3. The day before the surgery, eat only a light dinner. Then, do not eat or drink anything for at least 8 hours before the surgery. Ask your caregiver if it is OK to take any needed medicines with a sip of water. 4. Arrive at least an hour before the surgery or whenever your surgeon  recommends. This will give you time to check in and fill out any needed paperwork. PROCEDURE 1. The preparation: 1. You will change into a hospital gown. 2. A needle will be inserted in your arm. This is an intravenous access tube (IV). Medication will be able to flow directly into your body through this needle. 3. Small monitors will be put on your body. They are used to check your heart, blood pressure, and oxygen level. 4. You might be given medication that will help you relax (sedative). 5. You will be given a general anesthetic or spinal anesthesia. 2. The procedure: 1. Once you are asleep or numb from the waist down, your legs will be placed in stirrups. 2. The resectoscope will be passed through the urethra into the bladder. 3. Fluid will be passed through the resectoscope. This will fill the bladder with water. 4. The surgeon will examine the bladder through the scope. If the scope has a camera, it can take pictures from inside the bladder. They can be projected onto a TV screen. 5. The surgeon will use various tools to remove the tumor in small pieces. Sometimes a laser (a beam of light energy) is used. Other tools may use electric current. 6. A tube (catheter) will often be placed so that urine can drain into a bag outside the body. This process helps stop bleeding. This tube keeps blood clots from blocking the urethra. 7. The procedure usually takes 30 to 45 minutes. AFTER THE PROCEDURE  1. You will stay in a recovery area until the anesthesia has worn off. Your blood pressure and pulse will be checked every so often. Then you will be taken to a hospital room. 2. You may continue to get fluids through the IV for awhile. 3. Some pain is normal. The catheter might be uncomfortable. Pain is usually not severe. If it is, ask for pain medicine. 4. Your urine may look bloody after a transurethral resection. This is normal. 1. If bleeding is heavy, a hospital caregiver may rinse out the  bladder (irrigation) through the catheter. 2. Once the urine is clear, the catheter will be taken out. 3. You will need to stay in the hospital until you can urinate on your own. 5. Most people stay in the hospital for up to 4 days. PROGNOSIS   Transurethral resection is considered the best way to treat bladder tumors that are not too far along. For most people, the treatment is successful. Sometimes, though, more treatment is needed.  Bladder cancers can come back even after a successful procedure. Because of this, be sure to have a checkup with your caregiver every 3 to 6 months. If everything is OK for 3 years, you can reduce the checkups to once a year.   This information is not intended to replace advice given to you by your health care provider. Make sure you discuss any questions you have with your health care provider.   Document Released: 10/24/2008 Document Revised: 03/22/2011 Document Reviewed: 12/30/2008 Elsevier Interactive Patient Education 2016 [place] Anesthesia Home Care Instructions  Activity: Get plenty  of rest for the remainder of the day. A responsible adult should stay with you for 24 hours following the procedure.  For the next 24 hours, DO NOT: -Drive a car -Paediatric nurse -Drink alcoholic beverages -Take any medication unless instructed by your physician -Make any legal decisions or sign important papers.  Meals: Start with liquid foods such as gelatin or soup. Progress to regular foods as tolerated. Avoid greasy, spicy, heavy foods. If nausea and/or vomiting occur, drink only clear liquids until the nausea and/or vomiting subsides. Call your physician if vomiting continues.  Special Instructions/Symptoms: Your throat may feel dry or sore from the anesthesia or the breathing tube placed in your throat during surgery. If this causes discomfort, gargle with warm salt water. The discomfort should disappear within 24 hours.  If you had a  scopolamine patch placed behind your ear for the management of post- operative nausea and/or vomiting:  1. The medication in the patch is effective for 72 hours, after which it should be removed.  Wrap patch in a tissue and discard in the trash. Wash hands thoroughly with soap and water. 2. You may remove the patch earlier than 72 hours if you experience unpleasant side effects which may include dry mouth, dizziness or visual disturbances. 3. Avoid touching the patch. Wash your hands with soap and water after contact with the patch.   Foley Catheter Care, Adult A Foley catheter is a soft, flexible tube that is placed into the bladder to drain urine. A Foley catheter may be inserted if:  You leak urine or are not able to control when you urinate (urinary incontinence).  You are not able to urinate when you need to (urinary retention).  You had prostate surgery or surgery on the genitals.  You have certain medical conditions, such as multiple sclerosis, dementia, or a spinal cord injury. If you are going home with a Foley catheter in place, follow the instructions below. TAKING CARE OF THE CATHETER 5. Wash your hands with soap and water. 6. Using mild soap and warm water on a clean washcloth:  Clean the area on your body closest to the catheter insertion site using a circular motion, moving away from the catheter. Never wipe toward the catheter because this could sweep bacteria up into the urethra and cause infection.  Remove all traces of soap. Pat the area dry with a clean towel. For males, reposition the foreskin. 7. Attach the catheter to your leg so there is no tension on the catheter. Use adhesive tape or a leg strap. If you are using adhesive tape, remove any sticky residue left behind by the previous tape you used. 8. Keep the drainage bag below the level of the bladder, but keep it off the floor. 9. Check throughout the day to be sure the catheter is working and urine is draining  freely. Make sure the tubing does not become kinked. 10. Do not pull on the catheter or try to remove it. Pulling could damage internal tissues. TAKING CARE OF THE DRAINAGE BAGS You will be given two drainage bags to take home. One is a large overnight drainage bag, and the other is a smaller leg bag that fits underneath clothing. You may wear the overnight bag at any time, but you should never wear the smaller leg bag at night. Follow the instructions below for how to empty, change, and clean your drainage bags. Emptying the Drainage Bag You must empty your drainage bag when it is  - full  or at least 2-3 times a day. 5. Wash your hands with soap and water. 6. Keep the drainage bag below your hips, below the level of your bladder. This stops urine from going back into the tubing and into your bladder. 7. Hold the dirty bag over the toilet or a clean container. 8. Open the pour spout at the bottom of the bag and empty the urine into the toilet or container. Do not let the pour spout touch the toilet, container, or any other surface. Doing so can place bacteria on the bag, which can cause an infection. 9. Clean the pour spout with a gauze pad or cotton ball that has rubbing alcohol on it. 10. Close the pour spout. 11. Attach the bag to your leg with adhesive tape or a leg strap. 12. Wash your hands well. Changing the Drainage Bag Change your drainage bag once a month or sooner if it starts to smell bad or look dirty. Below are steps to follow when changing the drainage bag. 3. Wash your hands with soap and water. 4. Pinch off the rubber catheter so that urine does not spill out. 5. Disconnect the catheter tube from the drainage tube at the connection valve. Do not let the tubes touch any surface. 6. Clean the end of the catheter tube with an alcohol wipe. Use a different alcohol wipe to clean the end of the drainage tube. 7. Connect the catheter tube to the drainage tube of the clean drainage  bag. 8. Attach the new bag to the leg with adhesive tape or a leg strap. Avoid attaching the new bag too tightly. 9. Wash your hands well. Cleaning the Drainage Bag 6. Wash your hands with soap and water. 7. Wash the bag in warm, soapy water. 8. Rinse the bag thoroughly with warm water. 9. Fill the bag with a solution of white vinegar and water (1 cup vinegar to 1 qt warm water [.2 L vinegar to 1 L warm water]). Close the bag and soak it for 30 minutes in the solution. 10. Rinse the bag with warm water. 11. Hang the bag to dry with the pour spout open and hanging downward. 12. Store the clean bag (once it is dry) in a clean plastic bag. 13. Wash your hands well. PREVENTING INFECTION  Wash your hands before and after handling your catheter.  Take showers daily and wash the area where the catheter enters your body. Do not take baths. Replace wet leg straps with dry ones, if this applies.  Do not use powders, sprays, or lotions on the genital area. Only use creams, lotions, or ointments as directed by your caregiver.  For females, wipe from front to back after each bowel movement.  Drink enough fluids to keep your urine clear or pale yellow unless you have a fluid restriction.  Do not let the drainage bag or tubing touch or lie on the floor.  Wear cotton underwear to absorb moisture and to keep your skin drier. SEEK MEDICAL CARE IF:   Your urine is cloudy or smells unusually bad.  Your catheter becomes clogged.  You are not draining urine into the bag or your bladder feels full.  Your catheter starts to leak. SEEK IMMEDIATE MEDICAL CARE IF:   You have pain, swelling, redness, or pus where the catheter enters the body.  You have pain in the abdomen, legs, lower back, or bladder.  You have a fever.  You see blood fill the catheter, or your urine is  pink or red.  You have nausea, vomiting, or chills.  Your catheter gets pulled out. MAKE SURE YOU:   Understand these  instructions.  Will watch your condition.  Will get help right away if you are not doing well or get worse.   This information is not intended to replace advice given to you by your health care provider. Make sure you discuss any questions you have with your health care provider.   Document Released: 12/28/2004 Document Revised: 05/14/2013 Document Reviewed: 12/20/2011 Elsevier Interactive Patient Education 2016 [place] CARE INSTRUCTIONS  Activity: Rest for the remainder of the day.  Do not drive or operate equipment today.  You may resume normal activities in one to two days as instructed by your physician.   Meals: Drink plenty of liquids and eat light foods such as gelatin or soup this evening.  You may return to a normal meal plan tomorrow.  Return to Work: You may return to work in one to two days or as instructed by your physician.  Special Instructions / Symptoms: Call your physician if any of these symptoms occur:   -persistent or heavy bleeding  -bleeding which continues after first few urination  -large blood clots that are difficult to pass  -urine stream diminishes or stops completely  -fever equal to or higher than 101 degrees Farenheit.  -cloudy urine with a strong, foul odor  -severe pain  Females should always wipe from front to back after elimination.  You may feel some burning pain when you urinate.  This should disappear with time.  Applying moist heat to the lower abdomen or a hot tub bath may help relieve the pain. \  Follow-Up / Date of Return Visit to Your Physician: Call for an appointment to arrange follow-up.  Patient Signature:  ________________________________________________________  Nurse's Signature:  ________________________________________________________                                            Transurethral Procedure  Medications: Resume all your other meds from home  Activity: 1. No heavy lifting > 10 pounds for  2 weeks. 2. No sexual activity for 2 weeks. 3. No strenuous activity for 2 weeks. 4. No driving while on narcotic pain medications. 5. Drink plenty of water. 6. Continue to walk at home - you can still get blood clots when you are at home so keep     active but don't over do it. 7. Your urine may have some blood in it - make sure you drink plenty of water. Call or           come to the ER immediately if you catheter stops draining or you are unable to urinate.  Bathing: You can shower. You can take a bath unless you have a foley catheter in place.  Signs / Symptoms to call: 1. Call if you have a fever greater than 101.5  2. Uncontrolled nausea / vomiting, uncontrolled pain / dizziness, unable to urinate, leg         swelling / leg pain, or any other concerns.   You can reach [place] at [phone]

## 2015-06-02 NOTE — Op Note (Signed)
Date of procedure: 06/02/2015  Preoperative diagnosis:  1. Bladder tumor   Postoperative diagnosis:  1. Bladder tumor 2. Bulbar urethral stricture    Procedure: 1. Transurethral resection of bladder tumor-3 cm 2. Bulbar urethral dilation  Surgeon: Baruch Gouty, MD  Anesthesia: General  Complications: None  Intraoperative findings: The patient had a wide caliber bulbar urethral stricture that was dilated with the cystoscope. He also had a 3 cm tumor on the right lateral wall of the bladder. This was resected down to deep muscle with a small amount of fat visible.  EBL: None  Specimens: Bladder tumor to pathology  Drains: 20 French Foley catheter  Disposition: Stable to the postanesthesia care unit  Indication for procedure: The patient is a 80 y.o. male with gross hematuria whose workup revealed a right lateral wall bladder tumor who presents today for resection.  After reviewing the management options for treatment, the patient elected to proceed with the above surgical procedure(s). We have discussed the potential benefits and risks of the procedure, side effects of the proposed treatment, the likelihood of the patient achieving the goals of the procedure, and any potential problems that might occur during the procedure or recuperation. Informed consent has been obtained.  Description of procedure: The patient was met in the preoperative area. All risks, benefits, and indications of the procedure were described in great detail. The patient consented to the procedure. Preoperative antibiotics were given. The patient was taken to the operative theater. General anesthesia was induced per the anesthesia service. The patient was then placed in the dorsal lithotomy position and prepped and draped in the usual sterile fashion. A preoperative timeout was called.   An attempt was made to place a 24 [place] resectoscope sheath with a visual obturator, however in the bulbar urethra there is a  wide caliber stricture that was too narrow to pass the scope safely. The resectoscope sheath was withdrawn. A cystoscope was then inserted sensor wire was placed past the stricture into the bladder. The scope was then used to dilate over the wire this stricture. The sensor wire was left in place for a safety wire axis to the bladder. The resectoscope sheath with visual obturator was again placed easily past the stricture this point. Pan cystoscopy was remarkable for a 3 cm tumor on the right lateral wall. This was superior to the ureteral orifices. There is no other tumors visualized. This was resected down to deep muscle layers. There is a small amount of fat visible. Chips were evacuated with Ellik evacuator. Hemostasis was obtained and was excellent. Due to the deep resection as well as a urethral stricture decision was made to leave a Foley catheter. After excellent hemostasis was obtained, the resectoscope was removed. Over the sensor wire 20 [place] council tip catheter was placed. There is immediate return of clear yellow urine. No significant hematuria is noted. 15 cc was placed in the balloon. The sensor wire was removed. Catheter was easily irrigated with return of clear fluid. The chips were sent to pathology. The patient was woke from anesthesia. He was transferred in stable condition to the postanesthesia care unit.  Plan: The patient will follow-up in one week for Foley removal and to discuss pathology. He will also need a repeat CT scan of his chest due to to less than 1 cm nodules in 3 months to ensure stability.  Baruch Gouty, M.D.

## 2015-06-02 NOTE — Anesthesia Preprocedure Evaluation (Addendum)
Anesthesia Evaluation  Patient identified by MRN, date of birth, ID band Patient awake    Reviewed: Allergy & Precautions, NPO status , Patient's Chart, lab work & pertinent test results  Airway Mallampati: I  TM Distance: >3 FB Neck ROM: Full    Dental  (+) Teeth Intact, Dental Advisory Given   Pulmonary former smoker,    breath sounds clear to auscultation       Cardiovascular hypertension, Pt. on medications + CAD   Rhythm:Regular Rate:Normal     Neuro/Psych    GI/Hepatic   Endo/Other  diabetes, Well Controlled, Type 2, Oral Hypoglycemic Agents  Renal/GU      Musculoskeletal   Abdominal   Peds  Hematology   Anesthesia Other Findings   Reproductive/Obstetrics                           Anesthesia Physical Anesthesia Plan  ASA: III  Anesthesia Plan: General   Post-op Pain Management:    Induction: Intravenous  Airway Management Planned: LMA  Additional Equipment:   Intra-op Plan:   Post-operative Plan: Extubation in OR  Informed Consent: I have reviewed the patients History and Physical, chart, labs and discussed the procedure including the risks, benefits and alternatives for the proposed anesthesia with the patient or authorized representative who has indicated his/her understanding and acceptance.   Dental advisory given  Plan Discussed with: CRNA, Anesthesiologist and Surgeon  Anesthesia Plan Comments:         Anesthesia Quick Evaluation

## 2015-06-02 NOTE — H&P (Signed)
Chief Complaint Gross hematuria    History of Present Illness The patient is a 80 y.o. gentleman with a two week history of gross painless hematuria. The patient states that a few weeks ago he saw bright red blood per urethra. It was initially painless. Though at one point he had issues urinating then passed a small clot.He has not had a problem with urination or pain since that time. His gross hematuria spontaneously resolved. He has never had this issue before. He did pass a kidney stone many years ago, but he has not had one since.    When he was in the emergency room for this issue, his creatinine was 1.9. This is elevated from his apparent baseline.     He does have a 10 -pack-year smoking history but quit 45 years ago.     IPS score is 1 secondary to nocturia 1.     CT Urogram shows 2.5 cm bladder mass concerning for malignancy in the right lateral wall as well as bilateral lung nodules and indeterminately enlarged lymph nodes.   Past Medical History Problems  1. History of cardiac murmur (Z86.79) 2. History of diabetes mellitus (Z86.39) 3. History of hypercholesterolemia (Z86.39) 4. History of hypertension (Z86.79)  Surgical History Problems  1. History of Coronary Artery Single Venous Bypass Graft  Current Meds 1. AmLODIPine Besylate 10 MG Oral Tablet;  Therapy: (Recorded:04Apr2017) to Recorded 2. Aspirin 81 MG TABS;  Therapy: (Recorded:04Apr2017) to Recorded 3. Invokana 100 MG Oral Tablet;  Therapy: (Recorded:04Apr2017) to Recorded 4. Losartan Potassium-HCTZ 100-25 MG Oral Tablet;  Therapy: (Recorded:04Apr2017) to Recorded 5. Lovastatin 40 MG Oral Tablet;  Therapy: (Recorded:04Apr2017) to Recorded 6. MetFORMIN HCl - 500 MG Oral Tablet;  Therapy: (Recorded:04Apr2017) to Recorded 7. Mupirocin 2 % External Ointment;  Therapy: (Recorded:04Apr2017) to Recorded  Allergies Medication  1. Morphine Derivatives  Family History Problems  1. Family history of  Benign hematuria : Mother  Social History Problems  1. Denied: History of Alcohol use 2. Caffeine use (F15.90) 3. Father deceased   65 4. Former smoker [phone]) 5. Married 6. Mother deceased   36 7. Number of children   2 sons/ 1 daughter 8. Retired  Engineer, site Vital Signs [Data Includes: Last 1 Day]  Recorded: [id number] 11:23AM  Blood Pressure: 160 / 82 Heart Rate: 84  Physical Exam Constitutional: Well nourished . No acute distress.   ENT:. The ears and nose are normal in appearance.   Neck: The appearance of the neck is normal.   Pulmonary: No respiratory distress.   Cardiovascular:. No peripheral edema.   Abdomen: The abdomen is soft and nontender.   Skin: Normal skin turgor and no visible rash.   Neuro/Psych:. Mood and affect are appropriate. No focal sensory deficits.    Results/Data Selected Results  AU CT-HEMATURIA PROTOCOL [id number] 12:00AM Baruch Gouty   Test Name Result Flag Reference  AU CT-HEMATURIA PROTOCOL (Report)    ** RADIOLOGY REPORT BY [facility] **   CLINICAL DATA: Gross hematuria 1 month ago which lasted for 3 days.  EXAM: CT ABDOMEN AND PELVIS WITHOUT AND WITH CONTRAST  TECHNIQUE: Multidetector CT imaging of the abdomen and pelvis was performed following the standard protocol before and following the bolus administration of intravenous contrast.  CONTRAST: 125 cc Isovue-300  COMPARISON: None.  FINDINGS: Lower chest: The lung bases are clear of acute process. No infiltrates or effusions. There are several small pulmonary nodules bilaterally worrisome for metastatic disease. A full chest CT may be helpful for further  evaluation and complete imaging of the chest.  The heart is normal in size. No pericardial effusion. The distal esophagus is grossly normal.  Hepatobiliary: No focal hepatic lesions to suggest metastatic disease. No intrahepatic biliary dilatation. Numerous small layering gallstones are noted in  the gallbladder. No common bile duct dilatation.  Pancreas: No mass, inflammation or ductal dilatation.  Spleen: Normal size. No focal lesions.  Adrenals/Urinary Tract: Small left adrenal gland nodules consistent with benign adenomas. The right adrenal gland is normal.  No renal, ureteral or bladder calculi. Both kidneys demonstrate normal perfusion/enhancement. No collecting system abnormalities on the delayed images. Both ureters are normal.  There is a the 27 x 23 mm enhancing right-sided bladder mass with consistent with bladder neoplasm.  Stomach/Bowel: The stomach, duodenum, small bowel and colon are grossly normal without oral contrast. No inflammatory changes, mass lesions or obstructive findings. The appendix is normal.  Vascular/Lymphatic: Moderate atherosclerotic calcifications involving the aorta but no focal aneurysm or dissection. The branch vessels are patent. The major venous structures are patent. A retroaortic left renal vein is noted.  Small scattered mesenteric and retroperitoneal lymph nodes. No mass or overt adenopathy. There are also small pelvic lymph nodes which are indeterminate.  Other: No inguinal mass or adenopathy. No abdominal wall hernia or subcutaneous lesions.  Musculoskeletal: No significant bony findings. No evidence of osseous metastatic disease. Advanced degenerative changes involving the spine.  IMPRESSION: 1. Several small bilateral pulmonary nodules worrisome for metastatic disease. A full chest CT is suggested for further evaluation. 2. 27 x 23 mm enhancing right-sided bladder mass consistent with bladder neoplasm and likely responsible for the patient's gross hematuria. There are small bilateral pelvic lymph nodes and small retroperitoneal lymph nodes which are indeterminate. 3. No renal or ureteral calculi or mass. 4. Cholelithiasis. 5. Moderate atherosclerotic calcifications involving the aorta but no aneurysm or  dissection. 6. No findings suspicious for hepatic or osseous metastatic disease.   Electronically Signed  By: Marijo Sanes M.D.  On: 04/29/2015 10:31   Assessment Assessed  1. Bladder tumor (D49.4) 2. Lung mass (R91.8) 3. BPH (benign prostatic hyperplasia) (N40.0) 4. Gross hematuria (R31.0)  I discussed the risks, benefits, indications 2 year BT with the patient. He understood the risks include but are not limited to bleeding, infection, but a perforation, for catheter, and hospital stay. He is agreeable to undergoing this procedure. All questions were answered. He will also undergo CT chest results indeterminate lung nodules bilaterally.   Plan Bladder tumor, BPH (benign prostatic hyperplasia), Gross hematuria, Lung mass  1. CT-CHEST WITH CONTRAST; Status:Hold For - Appointment,PreCert,Date of  Service,Print; Requested for:01May2017;  2. URINE CULTURE; Status:Hold For - Specimen/Data Collection,Appointment; Requested  for:01May2017;  Health Maintenance  3. UA With REFLEX; [Do Not Release]; Status:In Progress - Specimen/Data Collected;    Done: [id number]  1. Bladder Mass  -TURBT    2. Bilateral lung nodules  -CT Chest    3. Indeterminate size pelvic lymph nodes  -as above    4. Gross hematuria  as above    5. BPH  Asymptomatic. No treatment necessary.

## 2015-06-03 ENCOUNTER — Encounter (HOSPITAL_BASED_OUTPATIENT_CLINIC_OR_DEPARTMENT_OTHER): Payer: Self-pay | Admitting: Urology

## 2015-06-04 ENCOUNTER — Other Ambulatory Visit: Payer: Self-pay | Admitting: *Deleted

## 2015-06-04 MED ORDER — CANAGLIFLOZIN 100 MG PO TABS: 50.0000 mg | ORAL_TABLET | Freq: Every day | ORAL | Status: DC

## 2015-06-04 NOTE — Anesthesia Postprocedure Evaluation (Signed)
Anesthesia Post Note  Patient: Jeff Burgess  Procedure(s) Performed: Procedure(s) (LRB): TRANSURETHRAL RESECTION OF BLADDER TUMOR WITH GYRUS (TURBT-GYRUS) (N/A)  Patient location during evaluation: PACU Anesthesia Type: General Level of consciousness: awake and alert Pain management: pain level controlled Vital Signs Assessment: post-procedure vital signs reviewed and stable Respiratory status: spontaneous breathing, nonlabored ventilation and respiratory function stable Cardiovascular status: blood pressure returned to baseline and stable Postop Assessment: no signs of nausea or vomiting Anesthetic complications: no    Last Vitals:  Filed Vitals:   06/02/15 1415 06/02/15 1515  BP: 130/60 125/65  Pulse: 62 62  Temp:  37.1 C  Resp: 16 14    Last Pain:  Filed Vitals:   06/03/15 1001  PainSc: 0-No pain                 Wasim Hurlbut A

## 2015-06-10 DIAGNOSIS — R338 Other retention of urine: Secondary | ICD-10-CM | POA: Diagnosis not present

## 2015-06-16 DIAGNOSIS — C672 Malignant neoplasm of lateral wall of bladder: Secondary | ICD-10-CM | POA: Diagnosis not present

## 2015-06-24 DIAGNOSIS — D494 Neoplasm of unspecified behavior of bladder: Secondary | ICD-10-CM | POA: Diagnosis not present

## 2015-06-24 DIAGNOSIS — N3001 Acute cystitis with hematuria: Secondary | ICD-10-CM | POA: Diagnosis not present

## 2015-07-09 ENCOUNTER — Other Ambulatory Visit: Payer: Self-pay | Admitting: *Deleted

## 2015-07-09 MED ORDER — LOVASTATIN 40 MG PO TABS
40.0000 mg | ORAL_TABLET | Freq: Every day | ORAL | Status: DC
Start: 2015-07-09 — End: 2015-12-08

## 2015-08-11 ENCOUNTER — Other Ambulatory Visit (INDEPENDENT_AMBULATORY_CARE_PROVIDER_SITE_OTHER): Payer: Commercial Managed Care - HMO

## 2015-08-11 DIAGNOSIS — I2510 Atherosclerotic heart disease of native coronary artery without angina pectoris: Secondary | ICD-10-CM

## 2015-08-11 DIAGNOSIS — N183 Chronic kidney disease, stage 3 (moderate): Secondary | ICD-10-CM

## 2015-08-11 DIAGNOSIS — I10 Essential (primary) hypertension: Secondary | ICD-10-CM | POA: Diagnosis not present

## 2015-08-11 DIAGNOSIS — E1122 Type 2 diabetes mellitus with diabetic chronic kidney disease: Secondary | ICD-10-CM

## 2015-08-11 LAB — BASIC METABOLIC PANEL
BUN: 19 mg/dL (ref 6–23)
CHLORIDE: 104 meq/L (ref 96–112)
CO2: 30 mEq/L (ref 19–32)
Calcium: 10 mg/dL (ref 8.4–10.5)
Creatinine, Ser: 1.25 mg/dL (ref 0.40–1.50)
GFR: 59.31 mL/min — ABNORMAL LOW (ref >60.00)
Glucose, Bld: 165 mg/dL — ABNORMAL HIGH (ref 70–99)
POTASSIUM: 4.7 meq/L (ref 3.5–5.1)
Sodium: 141 mEq/L (ref 135–145)

## 2015-08-11 LAB — CBC WITH DIFFERENTIAL/PLATELET
BASOS ABS: 0 10*3/uL (ref 0.0–0.1)
Basophils Relative: 0.4 % (ref 0.0–3.0)
EOS ABS: 0.2 10*3/uL (ref 0.0–0.7)
Eosinophils Relative: 1.9 % (ref 0.0–5.0)
HEMATOCRIT: 44.6 % (ref 39.0–52.0)
HEMOGLOBIN: 15.1 g/dL (ref 13.0–17.0)
LYMPHS PCT: 18.1 % (ref 12.0–46.0)
Lymphs Abs: 2.1 10*3/uL (ref 0.7–4.0)
MCHC: 33.8 g/dL (ref 30.0–36.0)
MCV: 87.3 fl (ref 78.0–100.0)
Monocytes Absolute: 1 10*3/uL (ref 0.1–1.0)
Monocytes Relative: 8.4 % (ref 3.0–12.0)
NEUTROS ABS: 8.2 10*3/uL — AB (ref 1.4–7.7)
Neutrophils Relative %: 71.2 % (ref 43.0–77.0)
PLATELETS: 218 10*3/uL (ref 150.0–400.0)
RBC: 5.11 Mil/uL (ref 4.22–5.81)
RDW: 13.5 % (ref 11.5–15.5)
WBC: 11.5 10*3/uL — AB (ref 4.0–10.5)

## 2015-08-11 LAB — HEMOGLOBIN A1C: HEMOGLOBIN A1C: 6.5 % (ref 4.6–6.5)

## 2015-08-11 LAB — HEPATIC FUNCTION PANEL
ALBUMIN: 4.3 g/dL (ref 3.5–5.2)
ALK PHOS: 78 U/L (ref 39–117)
ALT: 16 U/L (ref 0–53)
AST: 14 U/L (ref 0–37)
BILIRUBIN TOTAL: 0.6 mg/dL (ref 0.2–1.2)
Bilirubin, Direct: 0.1 mg/dL (ref 0.0–0.3)
Total Protein: 7.3 g/dL (ref 6.0–8.3)

## 2015-08-13 ENCOUNTER — Ambulatory Visit (INDEPENDENT_AMBULATORY_CARE_PROVIDER_SITE_OTHER): Payer: Commercial Managed Care - HMO | Admitting: Internal Medicine

## 2015-08-13 ENCOUNTER — Encounter: Payer: Self-pay | Admitting: Internal Medicine

## 2015-08-13 DIAGNOSIS — E785 Hyperlipidemia, unspecified: Secondary | ICD-10-CM

## 2015-08-13 DIAGNOSIS — I10 Essential (primary) hypertension: Secondary | ICD-10-CM

## 2015-08-13 DIAGNOSIS — G8929 Other chronic pain: Secondary | ICD-10-CM

## 2015-08-13 DIAGNOSIS — N183 Chronic kidney disease, stage 3 unspecified: Secondary | ICD-10-CM

## 2015-08-13 DIAGNOSIS — C679 Malignant neoplasm of bladder, unspecified: Secondary | ICD-10-CM

## 2015-08-13 DIAGNOSIS — E1122 Type 2 diabetes mellitus with diabetic chronic kidney disease: Secondary | ICD-10-CM

## 2015-08-13 DIAGNOSIS — R5383 Other fatigue: Secondary | ICD-10-CM

## 2015-08-13 DIAGNOSIS — M5440 Lumbago with sciatica, unspecified side: Secondary | ICD-10-CM

## 2015-08-13 MED ORDER — AMLODIPINE BESYLATE 10 MG PO TABS: 5.0000 mg | ORAL_TABLET | Freq: Every day | ORAL | 3 refills | Status: DC

## 2015-08-13 NOTE — Patient Instructions (Addendum)
Reduce Amlodipine to 1/2 tablet a day 

## 2015-08-13 NOTE — Progress Notes (Signed)
Subjective:  Patient ID: Jeff Burgess, male    DOB: Mar 23, 1935  Age: 80 y.o. MRN: [id number]  CC: No chief complaint on file.   HPI Jeff Burgess presents for CAD, HTN, dyslipidemia, DM f/u.  C/o worsening balance, staggering and fatigue - last year  Outpatient Medications Prior to Visit  Medication Sig Dispense Refill  . amLODipine (NORVASC) 10 MG tablet Take 1 tablet (10 mg total) by mouth daily. (Patient taking differently: Take 10 mg by mouth every morning. ) 90 tablet 3  . aspirin 81 MG tablet Take 81 mg by mouth daily.      . Cholecalciferol (VITAMIN D3) 1000 UNITS tablet Take 1,000 Units by mouth every morning.     [name] HYDROcodone-acetaminophen (NORCO) 5-325 MG tablet Take 1 tablet by mouth every 6 (six) hours as needed for moderate pain. 30 tablet 0  . losartan-hydrochlorothiazide (HYZAAR) 100-25 MG tablet Take 1 tablet by mouth daily. (Patient taking differently: Take 1 tablet by mouth every morning. ) 90 tablet 1  . lovastatin (MEVACOR) 40 MG tablet Take 1 tablet (40 mg total) by mouth at bedtime. 90 tablet 1  . metFORMIN (GLUCOPHAGE-XR) 500 MG 24 hr tablet TAKE ONE TABLET BY MOUTH TWICE DAILY 180 tablet 3  . canagliflozin (INVOKANA) 100 MG TABS tablet Take 1 tablet (100 mg total) by mouth daily. (Patient not taking: Reported on 08/13/2015) 30 tablet 11  . cephALEXin (KEFLEX) 500 MG capsule Take 1 capsule (500 mg total) by mouth 3 (three) times daily. (Patient not taking: Reported on 08/13/2015) 6 capsule 0   No facility-administered medications prior to visit.     ROS Review of Systems  Constitutional: Positive for fatigue. Negative for appetite change and unexpected weight change.  HENT: Negative for congestion, nosebleeds, sneezing, sore throat and trouble swallowing.   Eyes: Negative for itching and visual disturbance.  Respiratory: Negative for cough.   Cardiovascular: Negative for chest pain, palpitations and leg swelling.  Gastrointestinal: Negative for abdominal  distention, blood in stool, diarrhea and nausea.  Genitourinary: Negative for frequency and hematuria.  Musculoskeletal: Positive for gait problem and neck pain. Negative for arthralgias, back pain and joint swelling.  Skin: Negative for rash.  Neurological: Negative for dizziness, tremors, speech difficulty and weakness.  Psychiatric/Behavioral: Negative for agitation, dysphoric mood and sleep disturbance. The patient is not nervous/anxious.     Objective:  BP 128/70   Pulse 87   Wt 192 lb (87.1 kg)   SpO2 97%   BMI 28.35 kg/m   BP Readings from Last 3 Encounters:  08/13/15 128/70  06/02/15 125/65  05/05/15 120/82    Wt Readings from Last 3 Encounters:  08/13/15 192 lb (87.1 kg)  06/02/15 197 lb 8 oz (89.6 kg)  05/05/15 203 lb (92.1 kg)    Physical Exam  Constitutional: He is oriented to person, place, and time. He appears well-developed. No distress.  NAD  HENT:  Mouth/Throat: Oropharynx is clear and moist.  Eyes: Conjunctivae are normal. Pupils are equal, round, and reactive to light.  Neck: Normal range of motion. No JVD present. No thyromegaly present.  Cardiovascular: Normal rate, regular rhythm, normal heart sounds and intact distal pulses.  Exam reveals no gallop and no friction rub.   No murmur heard. Pulmonary/Chest: Effort normal and breath sounds normal. No respiratory distress. He has no wheezes. He has no rales. He exhibits no tenderness.  Abdominal: Soft. Bowel sounds are normal. He exhibits no distension and no mass. There is no tenderness. There  is no rebound and no guarding.  Musculoskeletal: Normal range of motion. He exhibits no edema or tenderness.  Lymphadenopathy:    He has no cervical adenopathy.  Neurological: He is alert and oriented to person, place, and time. He has normal reflexes. No cranial nerve deficit. He exhibits normal muscle tone. He displays a negative Romberg sign. Coordination and gait normal.  Skin: Skin is warm and dry. No rash  noted.  Psychiatric: He has a normal mood and affect. His behavior is normal. Judgment and thought content normal.  looks well Mild ataxia  Lab Results  Component Value Date   WBC 11.5 (H) 08/11/2015   HGB 15.1 08/11/2015   HCT 44.6 08/11/2015   PLT 218.0 08/11/2015   GLUCOSE 165 (H) 08/11/2015   CHOL 122 10/03/2012   TRIG 166.0 (H) 10/03/2012   HDL 29.20 (L) 10/03/2012   LDLDIRECT 78.4 11/02/2011   LDLCALC 60 10/03/2012   ALT 16 08/11/2015   AST 14 08/11/2015   NA 141 08/11/2015   K 4.7 08/11/2015   CL 104 08/11/2015   CREATININE 1.25 08/11/2015   BUN 19 08/11/2015   CO2 30 08/11/2015   TSH 1.53 10/03/2012   PSA 1.48 10/03/2012   INR 1.13 06/02/2015   HGBA1C 6.5 08/11/2015    No results found.  Assessment & Plan:   There are no diagnoses linked to this encounter. I have discontinued Jeff Burgess's cephALEXin. I am also having him maintain his aspirin, cholecalciferol, losartan-hydrochlorothiazide, metFORMIN, amLODipine, HYDROcodone-acetaminophen, canagliflozin, and lovastatin.  No orders of the defined types were placed in this encounter.    Follow-up: No Follow-up on file.  Walker Kehr, MD

## 2015-08-13 NOTE — Assessment & Plan Note (Signed)
Reduce Amlodipine to 1/2 tablet a day 

## 2015-08-13 NOTE — Assessment & Plan Note (Signed)
F/u w/Urology 

## 2015-08-13 NOTE — Assessment & Plan Note (Signed)
On Lovastatin

## 2015-08-13 NOTE — Assessment & Plan Note (Signed)
On Metformin, off Invokana

## 2015-08-13 NOTE — Assessment & Plan Note (Signed)
Better  

## 2015-08-13 NOTE — Progress Notes (Signed)
Pre visit review using our clinic review tool, if applicable. No additional management support is needed unless otherwise documented below in the visit note. 

## 2015-09-22 DIAGNOSIS — C672 Malignant neoplasm of lateral wall of bladder: Secondary | ICD-10-CM | POA: Diagnosis not present

## 2015-09-25 ENCOUNTER — Other Ambulatory Visit: Payer: Self-pay | Admitting: Urology

## 2015-10-06 ENCOUNTER — Encounter (HOSPITAL_BASED_OUTPATIENT_CLINIC_OR_DEPARTMENT_OTHER): Payer: Self-pay | Admitting: *Deleted

## 2015-10-06 NOTE — Progress Notes (Signed)
NPO AFTER MN.  ARRIVE AT 0630.  NEEDS CBC, BMET, PT/INR, PTT, AND EKG.  WILL TAKE NORVASC AM DOS W/ SIPS OF WATER.

## 2015-10-08 ENCOUNTER — Encounter (HOSPITAL_BASED_OUTPATIENT_CLINIC_OR_DEPARTMENT_OTHER)
Admission: RE | Disposition: A | Discharge status: Home / Self Care | Payer: Self-pay | Source: Ambulatory Visit | Attending: Urology

## 2015-10-08 ENCOUNTER — Ambulatory Visit (HOSPITAL_BASED_OUTPATIENT_CLINIC_OR_DEPARTMENT_OTHER): Payer: Commercial Managed Care - HMO | Admitting: Anesthesiology

## 2015-10-08 ENCOUNTER — Encounter (HOSPITAL_BASED_OUTPATIENT_CLINIC_OR_DEPARTMENT_OTHER): Payer: Self-pay | Admitting: *Deleted

## 2015-10-08 ENCOUNTER — Ambulatory Visit (HOSPITAL_BASED_OUTPATIENT_CLINIC_OR_DEPARTMENT_OTHER)
Admission: RE | Admit: 2015-10-08 | Discharge: 2015-10-08 | Disposition: A | Discharge status: Home / Self Care | Payer: Commercial Managed Care - HMO | Source: Ambulatory Visit | Attending: Urology | Admitting: Urology

## 2015-10-08 DIAGNOSIS — Z7982 Long term (current) use of aspirin: Secondary | ICD-10-CM | POA: Insufficient documentation

## 2015-10-08 DIAGNOSIS — E7800 Pure hypercholesterolemia, unspecified: Secondary | ICD-10-CM | POA: Diagnosis not present

## 2015-10-08 DIAGNOSIS — Z79899 Other long term (current) drug therapy: Secondary | ICD-10-CM | POA: Diagnosis not present

## 2015-10-08 DIAGNOSIS — I2510 Atherosclerotic heart disease of native coronary artery without angina pectoris: Secondary | ICD-10-CM | POA: Insufficient documentation

## 2015-10-08 DIAGNOSIS — Z7984 Long term (current) use of oral hypoglycemic drugs: Secondary | ICD-10-CM | POA: Diagnosis not present

## 2015-10-08 DIAGNOSIS — Z87891 Personal history of nicotine dependence: Secondary | ICD-10-CM | POA: Insufficient documentation

## 2015-10-08 DIAGNOSIS — C679 Malignant neoplasm of bladder, unspecified: Secondary | ICD-10-CM | POA: Insufficient documentation

## 2015-10-08 DIAGNOSIS — D494 Neoplasm of unspecified behavior of bladder: Secondary | ICD-10-CM | POA: Diagnosis not present

## 2015-10-08 DIAGNOSIS — Z951 Presence of aortocoronary bypass graft: Secondary | ICD-10-CM | POA: Diagnosis not present

## 2015-10-08 DIAGNOSIS — E119 Type 2 diabetes mellitus without complications: Secondary | ICD-10-CM | POA: Insufficient documentation

## 2015-10-08 DIAGNOSIS — I10 Essential (primary) hypertension: Secondary | ICD-10-CM | POA: Diagnosis not present

## 2015-10-08 DIAGNOSIS — I129 Hypertensive chronic kidney disease with stage 1 through stage 4 chronic kidney disease, or unspecified chronic kidney disease: Secondary | ICD-10-CM | POA: Diagnosis not present

## 2015-10-08 DIAGNOSIS — N3020 Other chronic cystitis without hematuria: Secondary | ICD-10-CM | POA: Diagnosis not present

## 2015-10-08 DIAGNOSIS — C678 Malignant neoplasm of overlapping sites of bladder: Secondary | ICD-10-CM

## 2015-10-08 HISTORY — DX: Presence of aortocoronary bypass graft: Z95.1

## 2015-10-08 HISTORY — PX: CYSTOSCOPY WITH FULGERATION: SHX6638

## 2015-10-08 HISTORY — PX: CYSTOSCOPY WITH BIOPSY: SHX5122

## 2015-10-08 HISTORY — DX: Benign prostatic hyperplasia with lower urinary tract symptoms: N40.1

## 2015-10-08 HISTORY — DX: Malignant neoplasm of bladder, unspecified: C67.9

## 2015-10-08 LAB — PROTIME-INR
INR: 1.02
PROTHROMBIN TIME: 13.4 s (ref 11.4–15.2)

## 2015-10-08 LAB — BASIC METABOLIC PANEL
ANION GAP: 10 (ref 5–15)
BUN: 28 mg/dL — ABNORMAL HIGH (ref 6–20)
CALCIUM: 9.7 mg/dL (ref 8.9–10.3)
CO2: 25 mmol/L (ref 22–32)
CREATININE: 1.33 mg/dL — AB (ref 0.61–1.24)
Chloride: 103 mmol/L (ref 101–111)
GFR, EST AFRICAN AMERICAN: 57 mL/min — AB (ref >60)
GFR, EST NON AFRICAN AMERICAN: 50 mL/min — AB (ref >60)
Glucose, Bld: 157 mg/dL — ABNORMAL HIGH (ref 65–99)
Potassium: 3.7 mmol/L (ref 3.5–5.1)
Sodium: 138 mmol/L (ref 135–145)

## 2015-10-08 LAB — CBC
HCT: 45.3 % (ref 39.0–52.0)
Hemoglobin: 15.7 g/dL (ref 13.0–17.0)
MCH: 29.8 pg (ref 26.0–34.0)
MCHC: 34.7 g/dL (ref 30.0–36.0)
MCV: 86.1 fL (ref 78.0–100.0)
PLATELETS: 211 10*3/uL (ref 150–400)
RBC: 5.26 MIL/uL (ref 4.22–5.81)
RDW: 12.7 % (ref 11.5–15.5)
WBC: 10.2 10*3/uL (ref 4.0–10.5)

## 2015-10-08 LAB — APTT: aPTT: 34 seconds (ref 24–36)

## 2015-10-08 LAB — GLUCOSE, CAPILLARY: Glucose-Capillary: 153 mg/dL — ABNORMAL HIGH (ref 65–99)

## 2015-10-08 SURGERY — CYSTOSCOPY, WITH BIOPSY
Anesthesia: General | Site: Bladder

## 2015-10-08 MED ORDER — FENTANYL CITRATE (PF) 100 MCG/2ML IJ SOLN
INTRAMUSCULAR | Status: AC
  Filled 2015-10-08: qty 2

## 2015-10-08 MED ORDER — PROPOFOL 10 MG/ML IV BOLUS
INTRAVENOUS | Status: AC
  Filled 2015-10-08: qty 20

## 2015-10-08 MED ORDER — HYDROCODONE-ACETAMINOPHEN 5-325 MG PO TABS: 1.0000 | ORAL_TABLET | ORAL | 0 refills | Status: DC | PRN

## 2015-10-08 MED ORDER — LIDOCAINE 2% (20 MG/ML) 5 ML SYRINGE
INTRAMUSCULAR | Status: DC | PRN
  Administered 2015-10-08: 80 mg via INTRAVENOUS

## 2015-10-08 MED ORDER — DEXAMETHASONE SODIUM PHOSPHATE 10 MG/ML IJ SOLN
INTRAMUSCULAR | Status: AC
  Filled 2015-10-08: qty 1

## 2015-10-08 MED ORDER — CEFAZOLIN SODIUM-DEXTROSE 2-4 GM/100ML-% IV SOLN
INTRAVENOUS | Status: AC
  Filled 2015-10-08: qty 100

## 2015-10-08 MED ORDER — PROMETHAZINE HCL 25 MG/ML IJ SOLN
6.2500 mg | INTRAMUSCULAR | Status: DC | PRN
  Filled 2015-10-08: qty 1

## 2015-10-08 MED ORDER — STERILE WATER FOR IRRIGATION IR SOLN
Status: DC | PRN
  Administered 2015-10-08: 3000 mL

## 2015-10-08 MED ORDER — LIDOCAINE 2% (20 MG/ML) 5 ML SYRINGE
INTRAMUSCULAR | Status: AC
  Filled 2015-10-08: qty 5

## 2015-10-08 MED ORDER — FENTANYL CITRATE (PF) 100 MCG/2ML IJ SOLN
INTRAMUSCULAR | Status: DC | PRN
  Administered 2015-10-08 (×4): 25 ug via INTRAVENOUS

## 2015-10-08 MED ORDER — SULFAMETHOXAZOLE-TRIMETHOPRIM 800-160 MG PO TABS: 1.0000 | ORAL_TABLET | Freq: Two times a day (BID) | ORAL | 0 refills | Status: DC

## 2015-10-08 MED ORDER — DEXAMETHASONE SODIUM PHOSPHATE 4 MG/ML IJ SOLN
INTRAMUSCULAR | Status: DC | PRN
  Administered 2015-10-08: 5 mg via INTRAVENOUS

## 2015-10-08 MED ORDER — SODIUM CHLORIDE 0.9 % IV SOLN
INTRAVENOUS | Status: DC
  Administered 2015-10-08: 07:00:00 via INTRAVENOUS
  Filled 2015-10-08: qty 1000

## 2015-10-08 MED ORDER — PROPOFOL 10 MG/ML IV BOLUS
INTRAVENOUS | Status: DC | PRN
  Administered 2015-10-08: 180 mg via INTRAVENOUS

## 2015-10-08 MED ORDER — ONDANSETRON HCL 4 MG/2ML IJ SOLN
INTRAMUSCULAR | Status: DC | PRN
  Administered 2015-10-08: 4 mg via INTRAVENOUS

## 2015-10-08 MED ORDER — CEFAZOLIN SODIUM-DEXTROSE 2-4 GM/100ML-% IV SOLN
2.0000 g | INTRAVENOUS | Status: AC
  Administered 2015-10-08: 2 g via INTRAVENOUS
  Filled 2015-10-08: qty 100

## 2015-10-08 MED ORDER — ONDANSETRON HCL 4 MG/2ML IJ SOLN
INTRAMUSCULAR | Status: AC
  Filled 2015-10-08: qty 2

## 2015-10-08 MED ORDER — FENTANYL CITRATE (PF) 100 MCG/2ML IJ SOLN
25.0000 ug | INTRAMUSCULAR | Status: DC | PRN
  Filled 2015-10-08: qty 1

## 2015-10-08 SURGICAL SUPPLY — 35 items
BAG DRAIN URO-CYSTO SKYTR STRL (DRAIN) ×4 IMPLANT
BAG DRN UROCATH (DRAIN) ×1
BAG URINE DRAINAGE (UROLOGICAL SUPPLIES) IMPLANT
BAG URINE LEG 500ML (DRAIN) IMPLANT
CATH FOLEY 2WAY SLVR  5CC 22FR (CATHETERS)
CATH FOLEY 2WAY SLVR 30CC 20FR (CATHETERS) IMPLANT
CATH FOLEY 2WAY SLVR 5CC 22FR (CATHETERS) IMPLANT
CATH INTERMIT  6FR 70CM (CATHETERS) ×4 IMPLANT
CLOTH BEACON ORANGE TIMEOUT ST (SAFETY) ×4 IMPLANT
ELECT BIVAP BIPO 22/24 DONUT (ELECTROSURGICAL)
ELECT REM PT RETURN 9FT ADLT (ELECTROSURGICAL) ×4
ELECTRD BIVAP BIPO 22/24 DONUT (ELECTROSURGICAL) IMPLANT
ELECTRODE REM PT RTRN 9FT ADLT (ELECTROSURGICAL) ×2 IMPLANT
EVACUATOR MICROVAS BLADDER (UROLOGICAL SUPPLIES) IMPLANT
GLOVE BIO SURGEON STRL SZ 6.5 (GLOVE) ×3 IMPLANT
GLOVE BIO SURGEON STRL SZ7.5 (GLOVE) ×8 IMPLANT
GLOVE BIO SURGEONS STRL SZ 6.5 (GLOVE) ×1
GLOVE BIOGEL PI IND STRL 6.5 (GLOVE) ×4 IMPLANT
GLOVE BIOGEL PI IND STRL 7.5 (GLOVE) ×2 IMPLANT
GLOVE BIOGEL PI INDICATOR 6.5 (GLOVE) ×4
GLOVE BIOGEL PI INDICATOR 7.5 (GLOVE) ×2
GOWN STRL REUS W/ TWL LRG LVL3 (GOWN DISPOSABLE) ×2 IMPLANT
GOWN STRL REUS W/ TWL XL LVL3 (GOWN DISPOSABLE) ×4 IMPLANT
GOWN STRL REUS W/TWL LRG LVL3 (GOWN DISPOSABLE) ×4
GOWN STRL REUS W/TWL XL LVL3 (GOWN DISPOSABLE) ×8
GUIDEWIRE STR DUAL SENSOR (WIRE) ×4 IMPLANT
KIT ROOM TURNOVER WOR (KITS) ×4 IMPLANT
LOOP CUT BIPOLAR 24F LRG (ELECTROSURGICAL) IMPLANT
MANIFOLD NEPTUNE II (INSTRUMENTS) ×4 IMPLANT
PACK CYSTO (CUSTOM PROCEDURE TRAY) ×4 IMPLANT
SET ASPIRATION TUBING (TUBING) IMPLANT
SYRINGE IRR TOOMEY STRL 70CC (SYRINGE) IMPLANT
TUBE CONNECTING 12'X1/4 (SUCTIONS)
TUBE CONNECTING 12X1/4 (SUCTIONS) IMPLANT
WATER STERILE IRR 3000ML UROMA (IV SOLUTION) ×4 IMPLANT

## 2015-10-08 NOTE — Transfer of Care (Signed)
Immediate Anesthesia Transfer of Care Note  Patient: Jeff Burgess  Procedure(s) Performed: Procedure(s) (LRB): CYSTOSCOPY WITH BLADDER BIOPSY (N/A) CYSTOSCOPY WITH FULGERATION (N/A)  Patient Location: PACU  Anesthesia Type: General  Level of Consciousness: awake, sedated, patient cooperative and responds to stimulation  Airway & Oxygen Therapy: Patient Spontanous Breathing and Patient connected to face mask oxygen  Post-op Assessment: Report given to PACU RN, Post -op Vital signs reviewed and stable and Patient moving all extremities  Post vital signs: Reviewed and stable  Complications: No apparent anesthesia complications

## 2015-10-08 NOTE — Anesthesia Procedure Notes (Signed)
Procedure Name: LMA Insertion Date/Time: 10/08/2015 8:32 AM Performed by: Justice Rocher Pre-anesthesia Checklist: Patient identified, Emergency Drugs available, Suction available and Patient being monitored Patient Re-evaluated:Patient Re-evaluated prior to inductionOxygen Delivery Method: Circle system utilized Preoxygenation: Pre-oxygenation with 100% oxygen Intubation Type: IV induction Ventilation: Mask ventilation without difficulty LMA: LMA inserted LMA Size: 5.0 Number of attempts: 1 Airway Equipment and Method: Bite block Placement Confirmation: positive ETCO2 Tube secured with: Tape Dental Injury: Teeth and Oropharynx as per pre-operative assessment

## 2015-10-08 NOTE — H&P (Signed)
CC: I have bladder cancer.  HPI: Jeff Burgess is a 80 year-old male established patient who is here for bladder cancer.  His problem was diagnosed 06/02/2015. His bladder cancer was diagnosed by Dr. Pilar Jarvis. The bladder cancer was found because of blood in his urine.   His bladder cancer was treated by removal with scope. Patient denies removal of the entire bladder, radiation, and chemotherapy.   He does have a good appetite.   His last IVP or CT Scan was 04/29/2015.   New diagnosis of high grade Ta TCC of bladder - 3.5 cm. s/p TURBT May 2017.   Presents for cystoscopy today. Patient elected not to pursue BCG at last visit.     ALLERGIES: Morphine Derivatives    MEDICATIONS: AmLODIPine Besylate 10 MG Oral Tablet Oral  Aspirin 81 MG TABS Oral  Hydrocodone-Acetaminophen 5-325 MG Oral Tablet Oral  Losartan Potassium-HCTZ 100-25 MG Oral Tablet Oral  Lovastatin 40 MG Oral Tablet Oral  MetFORMIN HCl - 500 MG Oral Tablet Oral  MetFORMIN HCl ER 500 MG Oral Tablet Extended Release 24 Hour Oral  Mupirocin 2 % External Ointment External     GU PSH: Cystoscopy TURBT 2-5 cm - 06/05/2015      PSH Notes: Cystoscopy With Fulguration Medium Lesion (2-5cm), Coronary Artery Single Venous Bypass Graft   NON-GU PSH: Coronary Artery Bypass Grafting (cabg) - 04/15/2015    GU PMH: Hemorragic Cystitis - 06/24/2015 Bladder Cancer Lateral - 06/16/2015 Bladder, Neoplasm of Unspecified behavior, Bladder tumor - 05/12/2015 BPH w/o LUTS, BPH (benign prostatic hyperplasia) - 05/12/2015 Gross hematuria, Gross hematuria - 05/12/2015    NON-GU PMH: Other nonspecific abnormal finding of lung field, Lung mass - 05/12/2015 Encounter for general adult medical examination without abnormal findings, Encounter for preventive health examination - 04/15/2015 Personal history of other diseases of the circulatory system, History of hypertension - 04/15/2015, History of cardiac murmur, - 04/15/2015 Personal history of other endocrine,  nutritional and metabolic disease, History of diabetes mellitus - 04/15/2015, History of hypercholesterolemia, - 04/15/2015 Benign intracranial hypertension Cardiac murmur, unspecified Diabetes insipidus    FAMILY HISTORY: Benign hematuria - Runs In Family   SOCIAL HISTORY: Marital Status: Married Current Smoking Status: Patient does not smoke anymore.  Drinks 1 caffeinated drink per day. Patient's occupation is/was retired.     Notes: Former smoker, Retired, Mother deceased, Caffeine use, Married, Alcohol use, Number of children, Father deceased   REVIEW OF SYSTEMS:    GU Review Male:   Patient reports get up at night to urinate and penile pain. Patient denies frequent urination, hard to postpone urination, burning/ pain with urination, leakage of urine, stream starts and stops, trouble starting your stream, have to strain to urinate , and erection problems.  Gastrointestinal (Upper):   Patient denies nausea, vomiting, and indigestion/ heartburn.  Gastrointestinal (Lower):   Patient denies diarrhea and constipation.  Constitutional:   Patient reports fatigue. Patient denies fever, night sweats, and weight loss.  Skin:   Patient denies skin rash/ lesion and itching.  Eyes:   Patient denies blurred vision and double vision.  Ears/ Nose/ Throat:   Patient denies sore throat and sinus problems.  Hematologic/Lymphatic:   Patient reports easy bruising. Patient denies swollen glands.  Cardiovascular:   Patient denies leg swelling and chest pains.  Respiratory:   Patient denies cough and shortness of breath.  Endocrine:   Patient denies excessive thirst.  Musculoskeletal:   Patient reports back pain and joint pain.   Neurological:   Patient denies  headaches and dizziness.  Psychologic:   Patient denies depression and anxiety.   VITAL SIGNS:      09/22/2015 01:51 PM  BP 183/73 mmHg  Pulse 87 /min  Temperature 97.0 F / 36 C   MULTI-SYSTEM PHYSICAL EXAMINATION:    Constitutional:  Well-nourished. No physical deformities. Normally developed. Good grooming.  Neck: Neck symmetrical, not swollen. Normal tracheal position.  Respiratory: No labored breathing, no use of accessory muscles.   Cardiovascular: Normal temperature, normal extremity pulses, no swelling, no varicosities.  Skin: No paleness, no jaundice, no cyanosis. No lesion, no ulcer, no rash.  Gastrointestinal: No mass, no tenderness, no rigidity, non obese abdomen.  Eyes: Normal conjunctivae. Normal eyelids.  Ears, Nose, Mouth, and Throat: Left ear no scars, no lesions, no masses. Right ear no scars, no lesions, no masses. Nose no scars, no lesions, no masses. Normal hearing. Normal lips.  Musculoskeletal: Normal gait and station of head and neck.     PAST DATA REVIEWED:  Source Of History:  Patient  Records Review:   Pathology Reports, Previous Patient Records   PROCEDURES:         Flexible Cystoscopy - 52000  Risks, benefits, and some of the potential complications of the procedure were discussed at length with the patient including infection, bleeding, voiding discomfort, urinary retention, fever, chills, sepsis, and others. All questions were answered. Informed consent was obtained. Antibiotic prophylaxis was given. Sterile technique and intraurethral analgesia were used.  Meatus:  Normal size. Normal location. Normal condition.  Urethra:  No strictures.  External Sphincter:  Normal.  Verumontanum:  Normal.  Prostate:  Non-obstructing. No hyperplasia.  Bladder Neck:  Non-obstructing.  Ureteral Orifices:  Normal location. Normal size. Normal shape. Effluxed clear urine.  Bladder:  Multiple erythematous areas throughout the bladder concerning for TCC recurrence vs CIS      The lower urinary tract was carefully examined. The procedure was well-tolerated and without complications. Antibiotic instructions were given. Instructions were given to call the office immediately for bloody urine, difficulty urinating,  urinary retention, painful or frequent urination, fever, chills, nausea, vomiting or other illness. The patient stated that he understood these instructions and would comply with them.         Urinalysis w/Scope - 81001 Dipstick Dipstick Cont'd Micro  Specimen: Voided Bilirubin: Neg WBC/hpf: 10-20/hpf  Color: Yellow Ketones: Neg RBC/hpf: 0-2/hpf  Appearance: Cloudy Blood: Neg Bacteria: Moderate (26-50/hpf)  Specific Gravity: 1.020 Protein: Trace Cystals: NS (Not Seen)  pH: 6.0 Urobilinogen: 0.2 Casts: NS (Not Seen)  Glucose: Neg Nitrites: Neg Trichomonas: Not Present    Leukocyte Esterase: 2+ Mucous: Not Present      Epithelial Cells: NS (Not Seen)      Yeast: NS (Not Seen)      Sperm: Not Present    ASSESSMENT:      ICD-10 Details  1 GU:   Bladder Cancer Lateral - C67.2    PLAN:           Orders Labs Urine Culture and Sensitivity          Schedule Procedure: Unspecified Date - Cystoscopy TURBNC - 24401          Document Letter(s):  Created for [employer]. Plotnikov, MD   Created for Patient: Clinical Summary    The patient and I talked at length about treatment options of bladder cancer. The relationship between smoking and bladder cancer was discussed. Etiologies of bladder cancer were discussed with the patient.   Treatment options were discussed  including the initial resection of the tumor(s), radical cystectomy both open and roboitc assisted, along with chemotherapy, immunotherapy, and radiation therapy. Pathological grades of bladder cancer, stages of bladder cancer including superficial and muscle invasive disease were discussed in detail.   The risks, benefits, and some of the possible complications of transurethral resection of the bladder tumor were discussed with the patient including the anticipated cure rate, the possible need for additional surgical procedures, adjuvant chemotherapy, radiation therapy or immunotherapy. The possibility that this operative procedure  might not to cure the underlying disease, that micrometastatic disease might already be present, and that this underlying disease might result in the death of the patient was discussed.  The general risks of the operative procedure and the perioperative period were discussed with the patient at length and in detail including swelling, pain, nausea, vomiting, fever, chills, infection, wound infection, sepsis, renal failure, internal or external bleeding, intraoperative bowel, organ or vascular injuries, postoperative formation of scar tissue, the need for blood transfusions, deep venous thrombosis or blood clots, pulmonary embolus, pneumonia, respiratory failure, heart attack, stroke, death and others.   Aternative treatment options were discussed with the patient in detail. All of the patient's questions were answered and he voiced an understanding of these risks, benefits and possible complications.   The patient gave fully informed consent to proceed with a cystoscopy, bladder biopsy, and possible transurethral resection of their bladder tumor for the treatment of thier bladder cancer.        Notes:   1. Possible recurrence of TCC of bladder  -cystoscopy, bladder biopsy, possible TURBT in OR

## 2015-10-08 NOTE — Anesthesia Postprocedure Evaluation (Signed)
Anesthesia Post Note  Patient: AVONTAY KREINBRINK  Procedure(s) Performed: Procedure(s) (LRB): CYSTOSCOPY WITH BLADDER BIOPSY (N/A) CYSTOSCOPY WITH FULGERATION (N/A)  Patient location during evaluation: PACU Anesthesia Type: General Level of consciousness: awake and alert Pain management: pain level controlled Vital Signs Assessment: post-procedure vital signs reviewed and stable Respiratory status: spontaneous breathing, nonlabored ventilation, respiratory function stable and patient connected to nasal cannula oxygen Cardiovascular status: blood pressure returned to baseline and stable Postop Assessment: no signs of nausea or vomiting Anesthetic complications: no    Last Vitals:  Vitals:   10/08/15 0930 10/08/15 0945  BP: 133/76 131/72  Pulse: 66 67  Resp: 11 13  Temp:      Last Pain:  Vitals:   10/08/15 0930  TempSrc:   PainSc: 0-No pain                 Hilary Pundt J

## 2015-10-08 NOTE — Op Note (Signed)
Date of procedure: 10/08/15  Preoperative diagnosis:  1. Bladder cancer   Postoperative diagnosis:  1. Bladder cancer   Procedure: 1. Cystoscopy 2. Bladder biopsy 5 3. Fulguration of bleeders  Surgeon: Baruch Gouty, MD  Anesthesia: General  Complications: None  Intraoperative findings: The patient multiple erythematous areas concerning for tumor recurrence. One area in the middle to trigone away from the ureteral orifices with biopsy. He had 4 other biopsies taken of his bilateral lateral walls and posterior walls of his bladder.  EBL: None  Specimens: Bladder biopsy 5  Drains: None  Disposition: Stable to the postanesthesia care unit  Indication for procedure: The patient is a 80 y.o. male with history of a 3.5 cm high-grade pTa bladder cancer who decided against BCG was found to have multiple erythematous areas in his bladder concerning for tumor recurrence. He presents today for biopsy.  After reviewing the management options for treatment, the patient elected to proceed with the above surgical procedure(s). We have discussed the potential benefits and risks of the procedure, side effects of the proposed treatment, the likelihood of the patient achieving the goals of the procedure, and any potential problems that might occur during the procedure or recuperation. Informed consent has been obtained.  Description of procedure: The patient was met in the preoperative area. All risks, benefits, and indications of the procedure were described in great detail. The patient consented to the procedure. Preoperative antibiotics were given. The patient was taken to the operative theater. General anesthesia was induced per the anesthesia service. The patient was then placed in the dorsal lithotomy position and prepped and draped in the usual sterile fashion. A preoperative timeout was called.   A 21 French 30 cystoscope was inserted into the patient's bladder per urethra atraumatically.  Pan  cystoscopy every 3 months. were biopsied 5. The areas were then fulgurated with the Bugbee. Hemostasis was excellent. The bladder was then drained. The patient was woken from anesthesia and transferred stable condition to the postanesthesia care unit.  Plan: The patient will follow-up in approximately one week to discuss pathology. If this is normal, he will continue surveillance cystoscopy 3 months.  Baruch Gouty, M.D.

## 2015-10-08 NOTE — Anesthesia Preprocedure Evaluation (Signed)
Anesthesia Evaluation  Patient identified by MRN, date of birth, ID band Patient awake    Reviewed: Allergy & Precautions, NPO status , Patient's Chart, lab work & pertinent test results  Airway Mallampati: II  TM Distance: >3 FB Neck ROM: Full    Dental no notable dental hx.    Pulmonary former smoker,    Pulmonary exam normal breath sounds clear to auscultation       Cardiovascular hypertension, + CAD  Normal cardiovascular exam+ dysrhythmias  Rhythm:Regular Rate:Normal     Neuro/Psych negative neurological ROS  negative psych ROS   GI/Hepatic negative GI ROS, Neg liver ROS,   Endo/Other  negative endocrine ROSdiabetes  Renal/GU Renal disease  negative genitourinary   Musculoskeletal  (+) Arthritis ,   Abdominal   Peds negative pediatric ROS (+)  Hematology negative hematology ROS (+)   Anesthesia Other Findings   Reproductive/Obstetrics negative OB ROS                             Anesthesia Physical Anesthesia Plan  ASA: III  Anesthesia Plan: General   Post-op Pain Management:    Induction: Intravenous  Airway Management Planned: LMA  Additional Equipment:   Intra-op Plan:   Post-operative Plan: Extubation in OR  Informed Consent: I have reviewed the patients History and Physical, chart, labs and discussed the procedure including the risks, benefits and alternatives for the proposed anesthesia with the patient or authorized representative who has indicated his/her understanding and acceptance.   Dental advisory given  Plan Discussed with: CRNA  Anesthesia Plan Comments:         Anesthesia Quick Evaluation

## 2015-10-08 NOTE — Discharge Instructions (Signed)

## 2015-10-09 ENCOUNTER — Encounter (HOSPITAL_BASED_OUTPATIENT_CLINIC_OR_DEPARTMENT_OTHER): Payer: Self-pay | Admitting: Urology

## 2015-10-14 DIAGNOSIS — C672 Malignant neoplasm of lateral wall of bladder: Secondary | ICD-10-CM | POA: Diagnosis not present

## 2015-10-15 ENCOUNTER — Ambulatory Visit (INDEPENDENT_AMBULATORY_CARE_PROVIDER_SITE_OTHER): Payer: Commercial Managed Care - HMO | Admitting: Sports Medicine

## 2015-10-15 DIAGNOSIS — M25511 Pain in right shoulder: Secondary | ICD-10-CM

## 2015-10-15 DIAGNOSIS — M542 Cervicalgia: Secondary | ICD-10-CM

## 2015-10-15 DIAGNOSIS — M4722 Other spondylosis with radiculopathy, cervical region: Secondary | ICD-10-CM

## 2015-10-27 ENCOUNTER — Telehealth: Payer: Self-pay | Admitting: Emergency Medicine

## 2015-10-27 NOTE — Telephone Encounter (Signed)
appt made with Dr Sharlet Salina for 10/17

## 2015-10-27 NOTE — Telephone Encounter (Signed)
Pt called and states he is having pain when he uses the restroom. He thinks it is another UTI. He is wondering if he can come in a give a specimen. I let him know Dr Camila Li is out of the office this week. He asked if I could send a message over and ask if another provider would put an order in. If not let him know and he will make an appt. Thanks.

## 2015-10-27 NOTE — Telephone Encounter (Signed)
If PCP were here, I would place order, but anyone else will need to see him in the office. Please schedule OV with another provider. Sorry!

## 2015-10-28 ENCOUNTER — Ambulatory Visit (INDEPENDENT_AMBULATORY_CARE_PROVIDER_SITE_OTHER): Payer: Commercial Managed Care - HMO | Admitting: Internal Medicine

## 2015-10-28 ENCOUNTER — Encounter: Payer: Self-pay | Admitting: Internal Medicine

## 2015-10-28 ENCOUNTER — Other Ambulatory Visit: Payer: Commercial Managed Care - HMO

## 2015-10-28 VITALS — BP 142/78 | HR 81 | Temp 98.4°F | Resp 16 | Ht 69.0 in | Wt 201.0 lb

## 2015-10-28 DIAGNOSIS — R300 Dysuria: Secondary | ICD-10-CM

## 2015-10-28 LAB — POCT URINALYSIS DIPSTICK
Bilirubin, UA: NEGATIVE
KETONES UA: NEGATIVE
Nitrite, UA: NEGATIVE
PH UA: 7
PROTEIN UA: NEGATIVE
SPEC GRAV UA: 1.025
Urobilinogen, UA: NEGATIVE

## 2015-10-28 MED ORDER — SULFAMETHOXAZOLE-TRIMETHOPRIM 800-160 MG PO TABS: 1.0000 | ORAL_TABLET | Freq: Two times a day (BID) | ORAL | 0 refills | Status: DC

## 2015-10-28 MED ORDER — SULFAMETHOXAZOLE-TRIMETHOPRIM 800-160 MG PO TABS
1.0000 | ORAL_TABLET | Freq: Two times a day (BID) | ORAL | 0 refills | Status: DC
Start: 2015-10-28 — End: 2016-09-16

## 2015-10-28 NOTE — Progress Notes (Signed)
   Subjective:    Patient ID: Jeff Burgess, male    DOB: 05/07/1935, 80 y.o.   MRN: [id number]  HPI The patient is a 80 YO man coming in for possible UTI. He had bladder surgery for cancer in the spring and was feeling like this and was told he had a UTI. He is not feeling well. No true pain. He just had a cystoscopy with biopsy in September about 2-3 weeks ago. No fevers or chills. Some discomfort in his stomach with urination only. No back pain. Mild pressure and frequency to go.   Review of Systems  Constitutional: Negative for activity change, appetite change, fatigue, fever and unexpected weight change.  Respiratory: Negative.   Cardiovascular: Negative.   Gastrointestinal: Negative.   Genitourinary: Positive for dysuria, frequency and urgency. Negative for decreased urine volume, difficulty urinating, flank pain and hematuria.  Musculoskeletal: Negative.   Skin: Negative.       Objective:   Physical Exam  Constitutional: He is oriented to person, place, and time. He appears well-developed and well-nourished.  HENT:  Head: Normocephalic and atraumatic.  Eyes: EOM are normal.  Cardiovascular: Normal rate and regular rhythm.   Pulmonary/Chest: Effort normal and breath sounds normal.  Abdominal: Soft. Bowel sounds are normal. He exhibits no distension and no mass. There is no tenderness. There is no rebound and no guarding.  Musculoskeletal: He exhibits no edema.  Neurological: He is alert and oriented to person, place, and time.  Skin: Skin is warm and dry.   Vitals:   10/28/15 0834 10/28/15 0944  BP: (!) 210/80 (!) 142/78  Pulse: 81   Resp: 16   Temp: 98.4 F (36.9 C)   TempSrc: Oral   SpO2: 98%   Weight: 201 lb (91.2 kg)   Height: 5\' 9"  (1.753 m)       Assessment & Plan:

## 2015-10-28 NOTE — Assessment & Plan Note (Signed)
Rx for bactrim and U/A in the office consistent with infection. Given recent instrumentation sending for culture to verify suseptabilities.

## 2015-10-28 NOTE — Patient Instructions (Signed)
We have sent in the bactrim for the infection. Take 1 pill twice a day for 5 days to clear the infection.   We are sending it off for culture to make sure the antibiotic is right.   Drink plenty of water to help flush the bladder while taking the antibiotics.

## 2015-10-28 NOTE — Progress Notes (Signed)
Pre visit review using our clinic review tool, if applicable. No additional management support is needed unless otherwise documented below in the visit note. 

## 2015-10-29 LAB — URINE CULTURE: ORGANISM ID, BACTERIA: NO GROWTH

## 2015-11-13 ENCOUNTER — Ambulatory Visit: Payer: Commercial Managed Care - HMO | Admitting: Internal Medicine

## 2015-11-26 ENCOUNTER — Encounter (INDEPENDENT_AMBULATORY_CARE_PROVIDER_SITE_OTHER): Payer: Self-pay | Admitting: Sports Medicine

## 2015-11-26 ENCOUNTER — Ambulatory Visit (INDEPENDENT_AMBULATORY_CARE_PROVIDER_SITE_OTHER): Payer: Commercial Managed Care - HMO | Admitting: Sports Medicine

## 2015-11-26 VITALS — BP 146/85 | HR 89 | Ht 69.0 in | Wt 201.0 lb

## 2015-11-26 DIAGNOSIS — M19011 Primary osteoarthritis, right shoulder: Secondary | ICD-10-CM | POA: Diagnosis not present

## 2015-11-26 DIAGNOSIS — M19019 Primary osteoarthritis, unspecified shoulder: Secondary | ICD-10-CM | POA: Diagnosis not present

## 2015-11-26 DIAGNOSIS — M4722 Other spondylosis with radiculopathy, cervical region: Secondary | ICD-10-CM | POA: Diagnosis not present

## 2015-11-26 MED ORDER — GABAPENTIN 300 MG PO CAPS: 300.0000 mg | ORAL_CAPSULE | Freq: Three times a day (TID) | ORAL | 1 refills | Status: DC

## 2015-11-26 NOTE — Progress Notes (Signed)
Jeff Burgess - 80 y.o. male MRN [id number]  Date of birth: 10-Oct-1935  Office Visit Note: Visit Date: 11/26/2015 PCP: Walker Kehr, MD Referred by: Cassandria Anger, MD  Subjective: Chief Complaint  Patient presents with  . Right Shoulder - Follow-up  . Follow-up    Patient states right shoulder is doing great. No concerns   HPI: Patient reports overall doing quite well. Had recurrence pain once following last office visit but has essentially been pain-free for the past several weeks. Some limitations in his shoulder range of motion but this is minimal & only affects reaching up above shoulder level. He is not having any nighttime disturbances. Otherwise he is feeling well. ROS:  Otherwise per HPI.   Clinical History: No specialty comments available.  He reports that he quit smoking about 44 years ago. His smoking use included Cigarettes. He quit after 15.00 years of use. His smokeless tobacco use includes Snuff.   Recent Labs  05/05/15 1416 08/11/15 0743  HGBA1C 7.3* 6.5    Assessment & Plan: Visit Diagnoses:  1. Cervical spondylosis with radiculopathy   2. Primary osteoarthritis of right shoulder   3. AC joint arthropathy    Plan: Patient reports doing remarkably better than last office visit. Symptoms were quite severe the week following our office visit however have essentially completely resolved this time. He has no pain radiating down the side of his arm. Some pain with overhead motion with the shoulder but this is minimal. He does not wish for any further intervention today. We'll plan to have him titrate off of the gabapentin to see if this is what is providing him relief but if he has any recurrence of his pain he should return to using the gabapentin twice per day as he currently is. I'm happy to see him back for further refills of this medication but he is otherwise doing well we'll plan to see him back only as needed. Follow-up: Return if symptoms worsen or  fail to improve.  Meds:  Meds ordered this encounter  Medications  . gabapentin (NEURONTIN) 300 MG capsule    Sig: Take 1 capsule (300 mg total) by mouth 3 (three) times daily.    Dispense:  90 capsule    Refill:  1   Procedures: No notes on file   Objective:  VS:  HT:5\' 9"  (175.3 cm)   WT:201 lb (91.2 kg)  BMI:29.7    BP:(!) 146/85  HR:89bpm  TEMP: ( )  RESP:  Physical Exam: Adult male. No acute distress. Alert & appropriate. ARM squeeze test is negative brachial plexus squeeze negative. His right shoulder has limited shoulder abduction but only minimal pain with this. His rotator cuff strength is intact. No significant AC Joint pain. No bony tenderness. Radial pulses 2+/4. Sensation upper choice is intact. Grip strength is symmetric. Imaging: No results found.   Past Medical/Family/Surgical/Social History: Medications & Allergies reviewed per EMR Patient Active Problem List   Diagnosis Date Noted  . Dysuria 10/28/2015  . Bladder cancer ([place]) 08/13/2015  . Sialoadenitis 01/01/2015  . RBBB 07/12/2014  . Pyogenic granuloma 02/06/2013  . Well adult exam 11/11/2011  . DM (diabetes mellitus), type 2 with renal complications ([place]) [id number]  . Hypogonadism male 03/10/2011  . Fatigue 11/10/2010  . Hyperkalemia 07/06/2010  . NEOPLASM OF UNCERTAIN BEHAVIOR OF SKIN 10/14/2009  . UNS ADVRS EFF UNS RX MEDICINAL&BIOLOGICAL SBSTNC 04/25/2008  . SEBORRHEIC DERMATITIS 12/28/2007  . ACTINIC KERATOSIS 12/28/2007  . Disorder resulting from impaired  renal function 09/30/2006  . LOW BACK PAIN 09/30/2006  . Dyslipidemia 07/26/2006  . Essential hypertension 07/26/2006  . Coronary atherosclerosis 07/26/2006  . OSTEOARTHRITIS 07/26/2006   Past Medical History:  Diagnosis Date  . Benign localized prostatic hyperplasia with lower urinary tract symptoms (LUTS)   . Bladder cancer [facility]) dx 05/ 2017--  urologist- dr Pilar Jarvis   TCC , Ta of bladder  s/p TURBT 06-02-2015  . CAD (coronary artery  disease)    no cardiologist--  followed by pcp,  dr plontnikov  . Chronic low back pain with sciatica   . CKD (chronic kidney disease), stage III   . Diabetes mellitus, type 2 ([place])   . ED (erectile dysfunction)   . Full dentures   . History of ETT    08-09-2014   fair exercise tolerance, no chest pain, normal BP response, no ST changes (baseline RBBB);  negative adequate ETT  . Hyperlipidemia   . Hypertension   . Hypogonadism male   . OA (osteoarthritis)   . Right bundle branch block (RBBB)   . S/P CABG x 1    1998  . Sigmoid diverticulosis   . Ventral hernia    midline  . Wears glasses    Family History  Problem Relation Age of Onset  . Arthritis Mother 30    RA  . Alcohol abuse Father 49    exposure  . Diabetes Brother   . Kidney disease Brother   . Heart disease Brother   . Hypertension Other   . Colon cancer Neg Hx   . Esophageal cancer Neg Hx   . Rectal cancer Neg Hx   . Stomach cancer Neg Hx    Past Surgical History:  Procedure Laterality Date  . COLONOSCOPY WITH PROPOFOL  02-01-2012  . CORONARY ARTERY BYPASS GRAFT  1998  at [facility]   x1  vessel   . CYSTOSCOPY WITH BIOPSY N/A 10/08/2015   Procedure: CYSTOSCOPY WITH BLADDER BIOPSY;  Surgeon: Nickie Retort, MD;  [facility];  Service: Urology;  Laterality: N/A;  . CYSTOSCOPY WITH FULGERATION N/A 10/08/2015   Procedure: CYSTOSCOPY WITH FULGERATION;  Surgeon: Nickie Retort, MD;  Location: [facility];  Service: Urology;  Laterality: N/A;  . TONSILLECTOMY  age 55  . TRANSURETHRAL RESECTION OF BLADDER TUMOR WITH GYRUS (TURBT-GYRUS) N/A 06/02/2015   Procedure: TRANSURETHRAL RESECTION OF BLADDER TUMOR WITH GYRUS (TURBT-GYRUS);  Surgeon: Nickie Retort, MD;  Location: [facility];  Service: Urology;  Laterality: N/A;   Social History   Occupational History  . retired     but working fulltime   Social History Main Topics  . Smoking  status: Former Smoker    Years: 15.00    Types: Cigarettes    Quit date: 01/18/1971  . Smokeless tobacco: Current User    Types: Snuff  . Alcohol use No  . Drug use: No  . Sexual activity: Not on file

## 2015-12-08 ENCOUNTER — Other Ambulatory Visit (INDEPENDENT_AMBULATORY_CARE_PROVIDER_SITE_OTHER): Payer: Commercial Managed Care - HMO

## 2015-12-08 ENCOUNTER — Ambulatory Visit (INDEPENDENT_AMBULATORY_CARE_PROVIDER_SITE_OTHER): Payer: Commercial Managed Care - HMO | Admitting: Internal Medicine

## 2015-12-08 ENCOUNTER — Encounter: Payer: Self-pay | Admitting: Internal Medicine

## 2015-12-08 DIAGNOSIS — E785 Hyperlipidemia, unspecified: Secondary | ICD-10-CM | POA: Diagnosis not present

## 2015-12-08 DIAGNOSIS — Z23 Encounter for immunization: Secondary | ICD-10-CM | POA: Diagnosis not present

## 2015-12-08 DIAGNOSIS — G8929 Other chronic pain: Secondary | ICD-10-CM

## 2015-12-08 DIAGNOSIS — I10 Essential (primary) hypertension: Secondary | ICD-10-CM

## 2015-12-08 DIAGNOSIS — M5440 Lumbago with sciatica, unspecified side: Secondary | ICD-10-CM | POA: Diagnosis not present

## 2015-12-08 DIAGNOSIS — E1122 Type 2 diabetes mellitus with diabetic chronic kidney disease: Secondary | ICD-10-CM

## 2015-12-08 DIAGNOSIS — E875 Hyperkalemia: Secondary | ICD-10-CM

## 2015-12-08 DIAGNOSIS — N183 Chronic kidney disease, stage 3 (moderate): Secondary | ICD-10-CM

## 2015-12-08 DIAGNOSIS — R269 Unspecified abnormalities of gait and mobility: Secondary | ICD-10-CM | POA: Diagnosis not present

## 2015-12-08 LAB — BASIC METABOLIC PANEL
BUN: 18 mg/dL (ref 6–23)
CALCIUM: 9.9 mg/dL (ref 8.4–10.5)
CO2: 30 meq/L (ref 19–32)
Chloride: 101 mEq/L (ref 96–112)
Creatinine, Ser: 1.3 mg/dL (ref 0.40–1.50)
GFR: 56.64 mL/min — ABNORMAL LOW (ref >60.00)
GLUCOSE: 179 mg/dL — AB (ref 70–99)
Potassium: 4.3 mEq/L (ref 3.5–5.1)
SODIUM: 140 meq/L (ref 135–145)

## 2015-12-08 LAB — HEMOGLOBIN A1C: Hgb A1c MFr Bld: 7.4 % — ABNORMAL HIGH (ref 4.6–6.5)

## 2015-12-08 MED ORDER — PRAVASTATIN SODIUM 40 MG PO TABS: 40.0000 mg | ORAL_TABLET | Freq: Every day | ORAL | 3 refills | Status: DC

## 2015-12-08 MED ORDER — COENZYME Q10 30 MG PO CAPS: 50.0000 mg | ORAL_CAPSULE | Freq: Two times a day (BID) | ORAL | 11 refills | Status: DC

## 2015-12-08 NOTE — Assessment & Plan Note (Addendum)
spinal stenosis vs statin effect vs other LS X ray  PT ref

## 2015-12-08 NOTE — Assessment & Plan Note (Signed)
Labs today

## 2015-12-08 NOTE — Patient Instructions (Signed)
Start chair yoga Stop Lovastatin Start Pravastatin in 2 weeks Start CoQ10

## 2015-12-08 NOTE — Assessment & Plan Note (Signed)
Labs

## 2015-12-08 NOTE — Progress Notes (Signed)
Pre visit review using our clinic review tool, if applicable. No additional management support is needed unless otherwise documented below in the visit note. 

## 2015-12-08 NOTE — Progress Notes (Signed)
Subjective:  Patient ID: Jeff Burgess, male    DOB: 01-Oct-1935  Age: 80 y.o. MRN: [id number]  CC: No chief complaint on file.   HPI ABHIJOT ARMACOST presents for DM2, HTN, CAD f/u. C/o balance issues and weak leg muscles x 1 year No LBP  Outpatient Medications Prior to Visit  Medication Sig Dispense Refill  . amLODipine (NORVASC) 10 MG tablet Take 0.5 tablets (5 mg total) by mouth daily. (Patient taking differently: Take 5 mg by mouth every morning. ) 90 tablet 3  . aspirin 81 MG tablet Take 81 mg by mouth daily.      . Cholecalciferol (VITAMIN D3) 1000 UNITS tablet Take 1,000 Units by mouth every morning.     [name] losartan-hydrochlorothiazide (HYZAAR) 100-25 MG tablet Take 1 tablet by mouth daily. (Patient taking differently: Take 1 tablet by mouth every morning. ) 90 tablet 1  . lovastatin (MEVACOR) 40 MG tablet Take 1 tablet (40 mg total) by mouth at bedtime. 90 tablet 1  . metFORMIN (GLUCOPHAGE-XR) 500 MG 24 hr tablet TAKE ONE TABLET BY MOUTH TWICE DAILY 180 tablet 3  . gabapentin (NEURONTIN) 300 MG capsule Take 1 capsule (300 mg total) by mouth 3 (three) times daily. (Patient not taking: Reported on 12/08/2015) 90 capsule 1  . HYDROcodone-acetaminophen (NORCO) 5-325 MG tablet Take 1 tablet by mouth every 4 (four) hours as needed for moderate pain. (Patient not taking: Reported on 12/08/2015) 30 tablet 0  . Misc Natural Products (GLUCOSAMINE CHOND COMPLEX/MSM) TABS Take by mouth daily.    [name] sulfamethoxazole-trimethoprim (BACTRIM DS,SEPTRA DS) 800-160 MG tablet Take 1 tablet by mouth 2 (two) times daily. (Patient not taking: Reported on 12/08/2015) 10 tablet 0   No facility-administered medications prior to visit.     ROS Review of Systems  Constitutional: Positive for fatigue. Negative for appetite change and unexpected weight change.  HENT: Negative for congestion, nosebleeds, sneezing, sore throat and trouble swallowing.   Eyes: Negative for itching and visual disturbance.    Respiratory: Negative for cough.   Cardiovascular: Negative for chest pain, palpitations and leg swelling.  Gastrointestinal: Negative for abdominal distention, blood in stool, diarrhea and nausea.  Genitourinary: Negative for frequency and hematuria.  Musculoskeletal: Positive for arthralgias and gait problem. Negative for back pain, joint swelling and neck pain.  Skin: Negative for rash.  Neurological: Negative for dizziness, tremors, speech difficulty and weakness.  Psychiatric/Behavioral: Negative for agitation, decreased concentration, dysphoric mood, sleep disturbance and suicidal ideas. The patient is not nervous/anxious.     Objective:  BP (!) 158/78   Pulse 98   Temp 98 F (36.7 C) (Oral)   Resp 16   Ht 5\' 9"  (1.753 m)   Wt 205 lb (93 kg)   SpO2 95%   BMI 30.27 kg/m   BP Readings from Last 3 Encounters:  12/08/15 (!) 158/78  11/26/15 (!) 146/85  10/28/15 (!) 142/78    Wt Readings from Last 3 Encounters:  12/08/15 205 lb (93 kg)  11/26/15 201 lb (91.2 kg)  10/28/15 201 lb (91.2 kg)    Physical Exam  Constitutional: He is oriented to person, place, and time. He appears well-developed. No distress.  NAD  HENT:  Mouth/Throat: Oropharynx is clear and moist.  Eyes: Conjunctivae are normal. Pupils are equal, round, and reactive to light.  Neck: Normal range of motion. No JVD present. No thyromegaly present.  Cardiovascular: Normal rate, regular rhythm, normal heart sounds and intact distal pulses.  Exam reveals no gallop and  no friction rub.   No murmur heard. Pulmonary/Chest: Effort normal and breath sounds normal. No respiratory distress. He has no wheezes. He has no rales. He exhibits no tenderness.  Abdominal: Soft. Bowel sounds are normal. He exhibits no distension and no mass. There is no tenderness. There is no rebound and no guarding.  Musculoskeletal: Normal range of motion. He exhibits tenderness. He exhibits no edema.  Lymphadenopathy:    He has no  cervical adenopathy.  Neurological: He is alert and oriented to person, place, and time. He has normal reflexes. No cranial nerve deficit. He exhibits normal muscle tone. He displays a negative Romberg sign. Coordination and gait normal.  Skin: Skin is warm and dry. No rash noted.  Psychiatric: He has a normal mood and affect. His behavior is normal. Judgment and thought content normal.  ataxic stiff  Lab Results  Component Value Date   WBC 10.2 10/08/2015   HGB 15.7 10/08/2015   HCT 45.3 10/08/2015   PLT 211 10/08/2015   GLUCOSE 157 (H) 10/08/2015   CHOL 122 10/03/2012   TRIG 166.0 (H) 10/03/2012   HDL 29.20 (L) 10/03/2012   LDLDIRECT 78.4 11/02/2011   LDLCALC 60 10/03/2012   ALT 16 08/11/2015   AST 14 08/11/2015   NA 138 10/08/2015   K 3.7 10/08/2015   CL 103 10/08/2015   CREATININE 1.33 (H) 10/08/2015   BUN 28 (H) 10/08/2015   CO2 25 10/08/2015   TSH 1.53 10/03/2012   PSA 1.48 10/03/2012   INR 1.02 10/08/2015   HGBA1C 6.5 08/11/2015    No results found.  Assessment & Plan:   There are no diagnoses linked to this encounter. I am having Mr. Lang maintain his aspirin, cholecalciferol, losartan-hydrochlorothiazide, metFORMIN, lovastatin, amLODipine, GLUCOSAMINE CHOND COMPLEX/MSM, HYDROcodone-acetaminophen, sulfamethoxazole-trimethoprim, and gabapentin.  No orders of the defined types were placed in this encounter.    Follow-up: No Follow-up on file.  Walker Kehr, MD

## 2015-12-08 NOTE — Assessment & Plan Note (Signed)
Hyzaar, Norvasc Labs

## 2015-12-08 NOTE — Assessment & Plan Note (Signed)
X ray

## 2016-01-19 DIAGNOSIS — M25511 Pain in right shoulder: Secondary | ICD-10-CM | POA: Diagnosis not present

## 2016-01-19 DIAGNOSIS — R269 Unspecified abnormalities of gait and mobility: Secondary | ICD-10-CM | POA: Diagnosis not present

## 2016-01-19 DIAGNOSIS — R2681 Unsteadiness on feet: Secondary | ICD-10-CM | POA: Diagnosis not present

## 2016-01-23 DIAGNOSIS — R2681 Unsteadiness on feet: Secondary | ICD-10-CM | POA: Diagnosis not present

## 2016-01-23 DIAGNOSIS — M25511 Pain in right shoulder: Secondary | ICD-10-CM | POA: Diagnosis not present

## 2016-01-23 DIAGNOSIS — R269 Unspecified abnormalities of gait and mobility: Secondary | ICD-10-CM | POA: Diagnosis not present

## 2016-02-02 DIAGNOSIS — R269 Unspecified abnormalities of gait and mobility: Secondary | ICD-10-CM | POA: Diagnosis not present

## 2016-02-02 DIAGNOSIS — M25511 Pain in right shoulder: Secondary | ICD-10-CM | POA: Diagnosis not present

## 2016-02-02 DIAGNOSIS — R2681 Unsteadiness on feet: Secondary | ICD-10-CM | POA: Diagnosis not present

## 2016-02-03 DIAGNOSIS — D494 Neoplasm of unspecified behavior of bladder: Secondary | ICD-10-CM | POA: Diagnosis not present

## 2016-02-12 DIAGNOSIS — R2681 Unsteadiness on feet: Secondary | ICD-10-CM | POA: Diagnosis not present

## 2016-02-12 DIAGNOSIS — R269 Unspecified abnormalities of gait and mobility: Secondary | ICD-10-CM | POA: Diagnosis not present

## 2016-02-12 DIAGNOSIS — M25511 Pain in right shoulder: Secondary | ICD-10-CM | POA: Diagnosis not present

## 2016-02-18 DIAGNOSIS — R2681 Unsteadiness on feet: Secondary | ICD-10-CM | POA: Diagnosis not present

## 2016-02-18 DIAGNOSIS — R269 Unspecified abnormalities of gait and mobility: Secondary | ICD-10-CM | POA: Diagnosis not present

## 2016-02-18 DIAGNOSIS — M25511 Pain in right shoulder: Secondary | ICD-10-CM | POA: Diagnosis not present

## 2016-02-24 DIAGNOSIS — M25511 Pain in right shoulder: Secondary | ICD-10-CM | POA: Diagnosis not present

## 2016-02-24 DIAGNOSIS — R269 Unspecified abnormalities of gait and mobility: Secondary | ICD-10-CM | POA: Diagnosis not present

## 2016-02-24 DIAGNOSIS — R2681 Unsteadiness on feet: Secondary | ICD-10-CM | POA: Diagnosis not present

## 2016-03-03 DIAGNOSIS — M25511 Pain in right shoulder: Secondary | ICD-10-CM | POA: Diagnosis not present

## 2016-03-03 DIAGNOSIS — R2681 Unsteadiness on feet: Secondary | ICD-10-CM | POA: Diagnosis not present

## 2016-03-03 DIAGNOSIS — R269 Unspecified abnormalities of gait and mobility: Secondary | ICD-10-CM | POA: Diagnosis not present

## 2016-03-09 ENCOUNTER — Ambulatory Visit (INDEPENDENT_AMBULATORY_CARE_PROVIDER_SITE_OTHER): Payer: Commercial Managed Care - HMO | Admitting: Internal Medicine

## 2016-03-09 ENCOUNTER — Encounter: Payer: Self-pay | Admitting: Internal Medicine

## 2016-03-09 ENCOUNTER — Other Ambulatory Visit (INDEPENDENT_AMBULATORY_CARE_PROVIDER_SITE_OTHER): Payer: Commercial Managed Care - HMO

## 2016-03-09 DIAGNOSIS — E785 Hyperlipidemia, unspecified: Secondary | ICD-10-CM

## 2016-03-09 DIAGNOSIS — M5440 Lumbago with sciatica, unspecified side: Secondary | ICD-10-CM | POA: Diagnosis not present

## 2016-03-09 DIAGNOSIS — I10 Essential (primary) hypertension: Secondary | ICD-10-CM

## 2016-03-09 DIAGNOSIS — N183 Chronic kidney disease, stage 3 (moderate): Secondary | ICD-10-CM | POA: Diagnosis not present

## 2016-03-09 DIAGNOSIS — E1122 Type 2 diabetes mellitus with diabetic chronic kidney disease: Secondary | ICD-10-CM

## 2016-03-09 DIAGNOSIS — G8929 Other chronic pain: Secondary | ICD-10-CM

## 2016-03-09 DIAGNOSIS — I2510 Atherosclerotic heart disease of native coronary artery without angina pectoris: Secondary | ICD-10-CM

## 2016-03-09 DIAGNOSIS — H532 Diplopia: Secondary | ICD-10-CM | POA: Insufficient documentation

## 2016-03-09 LAB — BASIC METABOLIC PANEL
BUN: 14 mg/dL (ref 6–23)
CHLORIDE: 102 meq/L (ref 96–112)
CO2: 29 meq/L (ref 19–32)
Calcium: 10 mg/dL (ref 8.4–10.5)
Creatinine, Ser: 1.3 mg/dL (ref 0.40–1.50)
GFR: 56.6 mL/min — ABNORMAL LOW (ref >60.00)
GLUCOSE: 170 mg/dL — AB (ref 70–99)
POTASSIUM: 4.9 meq/L (ref 3.5–5.1)
SODIUM: 141 meq/L (ref 135–145)

## 2016-03-09 LAB — HEMOGLOBIN A1C: Hgb A1c MFr Bld: 7.6 % — ABNORMAL HIGH (ref 4.6–6.5)

## 2016-03-09 MED ORDER — VALSARTAN-HYDROCHLOROTHIAZIDE 320-25 MG PO TABS: 1.0000 | ORAL_TABLET | Freq: Every day | ORAL | 3 refills | Status: DC

## 2016-03-09 MED ORDER — ASPIRIN 81 MG PO TABS: 162.0000 mg | ORAL_TABLET | Freq: Every day | ORAL | 3 refills | Status: DC

## 2016-03-09 NOTE — Assessment & Plan Note (Signed)
Norvasc D/c Hyzaar. Start Diovan HCT

## 2016-03-09 NOTE — Progress Notes (Signed)
Pre-visit discussion using our clinic review tool. No additional management support is needed unless otherwise documented below in the visit note.  

## 2016-03-09 NOTE — Assessment & Plan Note (Signed)
Exercising

## 2016-03-09 NOTE — Assessment & Plan Note (Addendum)
Carot doppler US ASA 162 mg/d

## 2016-03-09 NOTE — Assessment & Plan Note (Signed)
Metformin Labs 

## 2016-03-09 NOTE — Assessment & Plan Note (Signed)
In Rehab

## 2016-03-09 NOTE — Assessment & Plan Note (Signed)
On Lovastatin

## 2016-03-09 NOTE — Progress Notes (Signed)
Subjective:  Patient ID: Jeff Burgess, male    DOB: 02-12-1935  Age: 81 y.o. MRN: [id number]  CC: Follow-up (DM type 2)   HPI ARRICK PISKOR presents for HTN, DM, CAD C/o a faint feeling at the [employer] restaurant on Sat x2-3 seconds C/o seeing double x few hours 2 weeks ago...  Outpatient Medications Prior to Visit  Medication Sig Dispense Refill  . amLODipine (NORVASC) 10 MG tablet Take 0.5 tablets (5 mg total) by mouth daily. (Patient taking differently: Take 5 mg by mouth every morning. ) 90 tablet 3  . aspirin 81 MG tablet Take 81 mg by mouth daily.      . Cholecalciferol (VITAMIN D3) 1000 UNITS tablet Take 1,000 Units by mouth every morning.     [name] co-enzyme Q-10 30 MG capsule Take 2 capsules (60 mg total) by mouth 2 (two) times daily. 60 capsule 11  . losartan-hydrochlorothiazide (HYZAAR) 100-25 MG tablet Take 1 tablet by mouth daily. (Patient taking differently: Take 1 tablet by mouth every morning. ) 90 tablet 1  . metFORMIN (GLUCOPHAGE-XR) 500 MG 24 hr tablet TAKE ONE TABLET BY MOUTH TWICE DAILY 180 tablet 3  . Misc Natural Products (GLUCOSAMINE CHOND COMPLEX/MSM) TABS Take by mouth daily.    . pravastatin (PRAVACHOL) 40 MG tablet Take 1 tablet (40 mg total) by mouth daily. 90 tablet 3  . sulfamethoxazole-trimethoprim (BACTRIM DS,SEPTRA DS) 800-160 MG tablet Take 1 tablet by mouth 2 (two) times daily. 10 tablet 0  . gabapentin (NEURONTIN) 300 MG capsule Take 1 capsule (300 mg total) by mouth 3 (three) times daily. (Patient not taking: Reported on 12/08/2015) 90 capsule 1  . HYDROcodone-acetaminophen (NORCO) 5-325 MG tablet Take 1 tablet by mouth every 4 (four) hours as needed for moderate pain. (Patient not taking: Reported on 12/08/2015) 30 tablet 0   No facility-administered medications prior to visit.     ROS Review of Systems  Constitutional: Negative for appetite change, fatigue and unexpected weight change.  HENT: Negative for congestion, nosebleeds, sneezing, sore  throat and trouble swallowing.   Eyes: Positive for visual disturbance. Negative for pain and itching.  Respiratory: Negative for cough.   Cardiovascular: Negative for chest pain, palpitations and leg swelling.  Gastrointestinal: Negative for abdominal distention, blood in stool, diarrhea and nausea.  Genitourinary: Negative for frequency and hematuria.  Musculoskeletal: Negative for back pain, gait problem, joint swelling and neck pain.  Skin: Negative for rash.  Neurological: Positive for weakness. Negative for dizziness, tremors and speech difficulty.  Psychiatric/Behavioral: Negative for agitation, dysphoric mood and sleep disturbance. The patient is not nervous/anxious.     Objective:  BP (!) 148/68   Pulse 85   Temp 97.7 F (36.5 C) (Oral)   Resp 16   Ht 5\' 9"  (1.753 m)   Wt 202 lb (91.6 kg)   SpO2 98%   BMI 29.83 kg/m   BP Readings from Last 3 Encounters:  03/09/16 (!) 148/68  12/08/15 (!) 158/78  11/26/15 (!) 146/85    Wt Readings from Last 3 Encounters:  03/09/16 202 lb (91.6 kg)  12/08/15 205 lb (93 kg)  11/26/15 201 lb (91.2 kg)    Physical Exam  Constitutional: He is oriented to person, place, and time. He appears well-developed. No distress.  NAD  HENT:  Mouth/Throat: Oropharynx is clear and moist.  Eyes: Conjunctivae are normal. Pupils are equal, round, and reactive to light.  Neck: Normal range of motion. No JVD present. No thyromegaly present.  Cardiovascular: Normal  rate, regular rhythm, normal heart sounds and intact distal pulses.  Exam reveals no gallop and no friction rub.   No murmur heard. Pulmonary/Chest: Effort normal and breath sounds normal. No respiratory distress. He has no wheezes. He has no rales. He exhibits no tenderness.  Abdominal: Soft. Bowel sounds are normal. He exhibits no distension and no mass. There is no tenderness. There is no rebound and no guarding.  Musculoskeletal: Normal range of motion. He exhibits no edema or  tenderness.  Lymphadenopathy:    He has no cervical adenopathy.  Neurological: He is alert and oriented to person, place, and time. He has normal reflexes. No cranial nerve deficit. He exhibits normal muscle tone. He displays a negative Romberg sign. Coordination and gait normal.  Skin: Skin is warm and dry. No rash noted.  Psychiatric: He has a normal mood and affect. His behavior is normal. Judgment and thought content normal.    Lab Results  Component Value Date   WBC 10.2 10/08/2015   HGB 15.7 10/08/2015   HCT 45.3 10/08/2015   PLT 211 10/08/2015   GLUCOSE 179 (H) 12/08/2015   CHOL 122 10/03/2012   TRIG 166.0 (H) 10/03/2012   HDL 29.20 (L) 10/03/2012   LDLDIRECT 78.4 11/02/2011   LDLCALC 60 10/03/2012   ALT 16 08/11/2015   AST 14 08/11/2015   NA 140 12/08/2015   K 4.3 12/08/2015   CL 101 12/08/2015   CREATININE 1.30 12/08/2015   BUN 18 12/08/2015   CO2 30 12/08/2015   TSH 1.53 10/03/2012   PSA 1.48 10/03/2012   INR 1.02 10/08/2015   HGBA1C 7.4 (H) 12/08/2015    No results found.  Assessment & Plan:   There are no diagnoses linked to this encounter. I have discontinued Mr. Eltz HYDROcodone-acetaminophen and gabapentin. I am also having him maintain his aspirin, cholecalciferol, losartan-hydrochlorothiazide, metFORMIN, amLODipine, GLUCOSAMINE CHOND COMPLEX/MSM, sulfamethoxazole-trimethoprim, pravastatin, and co-enzyme Q-10.  No orders of the defined types were placed in this encounter.    Follow-up: No Follow-up on file.  Walker Kehr, MD

## 2016-03-10 ENCOUNTER — Other Ambulatory Visit: Payer: Self-pay | Admitting: Internal Medicine

## 2016-03-10 MED ORDER — REPAGLINIDE 0.5 MG PO TABS: 0.5000 mg | ORAL_TABLET | Freq: Three times a day (TID) | ORAL | 11 refills | Status: DC

## 2016-03-10 NOTE — Addendum Note (Signed)
Addended by: Earnstine Regal on: 03/10/2016 10:33 AM   Modules accepted: Orders

## 2016-03-11 DIAGNOSIS — M25511 Pain in right shoulder: Secondary | ICD-10-CM | POA: Diagnosis not present

## 2016-03-11 DIAGNOSIS — R269 Unspecified abnormalities of gait and mobility: Secondary | ICD-10-CM | POA: Diagnosis not present

## 2016-03-11 DIAGNOSIS — R2681 Unsteadiness on feet: Secondary | ICD-10-CM | POA: Diagnosis not present

## 2016-03-18 DIAGNOSIS — R269 Unspecified abnormalities of gait and mobility: Secondary | ICD-10-CM | POA: Diagnosis not present

## 2016-03-18 DIAGNOSIS — M25511 Pain in right shoulder: Secondary | ICD-10-CM | POA: Diagnosis not present

## 2016-03-18 DIAGNOSIS — R2681 Unsteadiness on feet: Secondary | ICD-10-CM | POA: Diagnosis not present

## 2016-03-24 ENCOUNTER — Ambulatory Visit (HOSPITAL_COMMUNITY)
Admission: RE | Admit: 2016-03-24 | Discharge: 2016-03-24 | Disposition: A | Discharge status: Home / Self Care | Payer: Medicare HMO | Source: Ambulatory Visit | Attending: Cardiovascular Disease | Admitting: Cardiovascular Disease

## 2016-03-24 ENCOUNTER — Telehealth: Payer: Self-pay | Admitting: Internal Medicine

## 2016-03-24 DIAGNOSIS — H532 Diplopia: Secondary | ICD-10-CM | POA: Diagnosis not present

## 2016-03-24 DIAGNOSIS — I6523 Occlusion and stenosis of bilateral carotid arteries: Secondary | ICD-10-CM | POA: Insufficient documentation

## 2016-03-24 DIAGNOSIS — E785 Hyperlipidemia, unspecified: Secondary | ICD-10-CM | POA: Diagnosis not present

## 2016-03-24 NOTE — Telephone Encounter (Signed)
Jeff Burgess, Pls inform the pt - carotid US wth mild plaque. No change in plans Thx

## 2016-03-25 NOTE — Telephone Encounter (Signed)
Advised patient of dr plotnikov's note/instructions 

## 2016-05-03 DIAGNOSIS — N400 Enlarged prostate without lower urinary tract symptoms: Secondary | ICD-10-CM | POA: Diagnosis not present

## 2016-05-03 DIAGNOSIS — C672 Malignant neoplasm of lateral wall of bladder: Secondary | ICD-10-CM | POA: Diagnosis not present

## 2016-05-19 ENCOUNTER — Other Ambulatory Visit: Payer: Self-pay | Admitting: *Deleted

## 2016-05-19 MED ORDER — METFORMIN HCL ER 500 MG PO TB24: 500.0000 mg | ORAL_TABLET | Freq: Two times a day (BID) | ORAL | 1 refills | Status: DC

## 2016-05-26 ENCOUNTER — Telehealth: Payer: Self-pay | Admitting: Internal Medicine

## 2016-05-26 NOTE — Telephone Encounter (Signed)
Called patient to schedule awv. Lvm for patient to call office to schedule appt.  °

## 2016-06-08 ENCOUNTER — Encounter: Payer: Self-pay | Admitting: Internal Medicine

## 2016-06-08 ENCOUNTER — Ambulatory Visit (INDEPENDENT_AMBULATORY_CARE_PROVIDER_SITE_OTHER): Payer: Medicare HMO | Admitting: Internal Medicine

## 2016-06-08 ENCOUNTER — Other Ambulatory Visit (INDEPENDENT_AMBULATORY_CARE_PROVIDER_SITE_OTHER): Payer: Medicare HMO

## 2016-06-08 DIAGNOSIS — I10 Essential (primary) hypertension: Secondary | ICD-10-CM

## 2016-06-08 DIAGNOSIS — I2510 Atherosclerotic heart disease of native coronary artery without angina pectoris: Secondary | ICD-10-CM | POA: Diagnosis not present

## 2016-06-08 DIAGNOSIS — N183 Chronic kidney disease, stage 3 unspecified: Secondary | ICD-10-CM

## 2016-06-08 DIAGNOSIS — E785 Hyperlipidemia, unspecified: Secondary | ICD-10-CM

## 2016-06-08 DIAGNOSIS — E1122 Type 2 diabetes mellitus with diabetic chronic kidney disease: Secondary | ICD-10-CM

## 2016-06-08 DIAGNOSIS — M5440 Lumbago with sciatica, unspecified side: Secondary | ICD-10-CM

## 2016-06-08 LAB — BASIC METABOLIC PANEL
BUN: 21 mg/dL (ref 6–23)
CO2: 29 mEq/L (ref 19–32)
Calcium: 10.4 mg/dL (ref 8.4–10.5)
Chloride: 100 mEq/L (ref 96–112)
Creatinine, Ser: 1.38 mg/dL (ref 0.40–1.50)
GFR: 52.8 mL/min — ABNORMAL LOW (ref >60.00)
Glucose, Bld: 154 mg/dL — ABNORMAL HIGH (ref 70–99)
Potassium: 4.5 mEq/L (ref 3.5–5.1)
Sodium: 138 mEq/L (ref 135–145)

## 2016-06-08 LAB — HEMOGLOBIN A1C: HEMOGLOBIN A1C: 6.8 % — AB (ref 4.6–6.5)

## 2016-06-08 MED ORDER — ZOSTER VAC RECOMB ADJUVANTED 50 MCG/0.5ML IM SUSR: 0.5000 mL | Freq: Once | INTRAMUSCULAR | 1 refills | Status: AC

## 2016-06-08 NOTE — Progress Notes (Signed)
Subjective:  Patient ID: Jeff Burgess, male    DOB: [birth year]  Age: 81 y.o. MRN: [id number]  CC: No chief complaint on file.   HPI Jeff Burgess presents for HTN, DM2, CAD f/u. He is planning to fast  Outpatient Medications Prior to Visit  Medication Sig Dispense Refill  . amLODipine (NORVASC) 10 MG tablet Take 0.5 tablets (5 mg total) by mouth daily. (Patient taking differently: Take 5 mg by mouth every morning. ) 90 tablet 3  . aspirin 81 MG tablet Take 2 tablets (162 mg total) by mouth daily. 100 tablet 3  . Cholecalciferol (VITAMIN D3) 1000 UNITS tablet Take 1,000 Units by mouth every morning.     [name] co-enzyme Q-10 30 MG capsule Take 2 capsules (60 mg total) by mouth 2 (two) times daily. 60 capsule 11  . metFORMIN (GLUCOPHAGE-XR) 500 MG 24 hr tablet Take 1 tablet (500 mg total) by mouth 2 (two) times daily. 180 tablet 1  . Misc Natural Products (GLUCOSAMINE CHOND COMPLEX/MSM) TABS Take by mouth daily.    . pravastatin (PRAVACHOL) 40 MG tablet Take 1 tablet (40 mg total) by mouth daily. 90 tablet 3  . repaglinide (PRANDIN) 0.5 MG tablet Take 1 tablet (0.5 mg total) by mouth 3 (three) times daily before meals. 90 tablet 11  . sulfamethoxazole-trimethoprim (BACTRIM DS,SEPTRA DS) 800-160 MG tablet Take 1 tablet by mouth 2 (two) times daily. 10 tablet 0  . valsartan-hydrochlorothiazide (DIOVAN HCT) 320-25 MG tablet Take 1 tablet by mouth daily. 90 tablet 3   No facility-administered medications prior to visit.     ROS Review of Systems  Constitutional: Negative for appetite change, fatigue and unexpected weight change.  HENT: Negative for congestion, nosebleeds, sneezing, sore throat and trouble swallowing.   Eyes: Negative for itching and visual disturbance.  Respiratory: Negative for cough.   Cardiovascular: Negative for chest pain, palpitations and leg swelling.  Gastrointestinal: Negative for abdominal distention, blood in stool, diarrhea and nausea.  Genitourinary:  Negative for frequency and hematuria.  Musculoskeletal: Positive for arthralgias. Negative for back pain, gait problem, joint swelling and neck pain.  Skin: Negative for rash.  Neurological: Negative for dizziness, tremors, speech difficulty and weakness.  Psychiatric/Behavioral: Negative for agitation, dysphoric mood, sleep disturbance and suicidal ideas. The patient is not nervous/anxious.     Objective:  BP (!) 162/84 (BP Location: Left Arm, Patient Position: Sitting, Cuff Size: Normal)   Pulse 76   Temp 97.7 F (36.5 C) (Oral)   Ht 5\' 9"  (1.753 m)   Wt 199 lb (90.3 kg)   SpO2 98%   BMI 29.39 kg/m   BP Readings from Last 3 Encounters:  06/08/16 (!) 162/84  03/09/16 (!) 148/68  12/08/15 (!) 158/78    Wt Readings from Last 3 Encounters:  06/08/16 199 lb (90.3 kg)  03/09/16 202 lb (91.6 kg)  12/08/15 205 lb (93 kg)    Physical Exam  Constitutional: He is oriented to person, place, and time. He appears well-developed. No distress.  NAD  HENT:  Mouth/Throat: Oropharynx is clear and moist.  Eyes: Conjunctivae are normal. Pupils are equal, round, and reactive to light.  Neck: Normal range of motion. No JVD present. No thyromegaly present.  Cardiovascular: Normal rate, regular rhythm, normal heart sounds and intact distal pulses.  Exam reveals no gallop and no friction rub.   No murmur heard. Pulmonary/Chest: Effort normal and breath sounds normal. No respiratory distress. He has no wheezes. He has no rales. He exhibits no  tenderness.  Abdominal: Soft. Bowel sounds are normal. He exhibits no distension and no mass. There is no tenderness. There is no rebound and no guarding.  Musculoskeletal: Normal range of motion. He exhibits tenderness. He exhibits no edema.  Lymphadenopathy:    He has no cervical adenopathy.  Neurological: He is alert and oriented to person, place, and time. He has normal reflexes. No cranial nerve deficit. He exhibits normal muscle tone. He displays a  negative Romberg sign. Coordination and gait normal.  Skin: Skin is warm and dry. No rash noted.  Psychiatric: He has a normal mood and affect. His behavior is normal. Judgment and thought content normal.    Lab Results  Component Value Date   WBC 10.2 10/08/2015   HGB 15.7 10/08/2015   HCT 45.3 10/08/2015   PLT 211 10/08/2015   GLUCOSE 170 (H) 03/09/2016   CHOL 122 10/03/2012   TRIG 166.0 (H) 10/03/2012   HDL 29.20 (L) 10/03/2012   LDLDIRECT 78.4 11/02/2011   LDLCALC 60 10/03/2012   ALT 16 08/11/2015   AST 14 08/11/2015   NA 141 03/09/2016   K 4.9 03/09/2016   CL 102 03/09/2016   CREATININE 1.30 03/09/2016   BUN 14 03/09/2016   CO2 29 03/09/2016   TSH 1.53 10/03/2012   PSA 1.48 10/03/2012   INR 1.02 10/08/2015   HGBA1C 7.6 (H) 03/09/2016    No results found.  Assessment & Plan:   There are no diagnoses linked to this encounter. I am having [name] maintain his cholecalciferol, amLODipine, GLUCOSAMINE CHOND COMPLEX/MSM, sulfamethoxazole-trimethoprim, pravastatin, co-enzyme Q-10, valsartan-hydrochlorothiazide, aspirin, repaglinide, and metFORMIN.  No orders of the defined types were placed in this encounter.    Follow-up: No Follow-up on file.  Walker Kehr, MD

## 2016-06-08 NOTE — Assessment & Plan Note (Signed)
Diovan-HCT 

## 2016-06-08 NOTE — Assessment & Plan Note (Signed)
He is planning to fast 24 h a week - hold Prandin and use 1 Metformin if fasting

## 2016-06-08 NOTE — Assessment & Plan Note (Signed)
ASA, Amlodipine, Lovastatin

## 2016-06-08 NOTE — Assessment & Plan Note (Signed)
Turmeric

## 2016-06-08 NOTE — Patient Instructions (Addendum)
Turmeric for arthritis  Well w/Jill

## 2016-08-03 DIAGNOSIS — C672 Malignant neoplasm of lateral wall of bladder: Secondary | ICD-10-CM | POA: Diagnosis not present

## 2016-08-03 DIAGNOSIS — N400 Enlarged prostate without lower urinary tract symptoms: Secondary | ICD-10-CM | POA: Diagnosis not present

## 2016-08-09 ENCOUNTER — Telehealth: Payer: Self-pay | Admitting: Internal Medicine

## 2016-08-12 ENCOUNTER — Telehealth: Payer: Self-pay

## 2016-08-12 NOTE — Telephone Encounter (Signed)
OK Micardis HCT 80-25 one a day Thx

## 2016-08-12 NOTE — Telephone Encounter (Signed)
Rec'd fax from pharmacy, pt Valsartan has been recalled please advise about substitute.

## 2016-08-13 MED ORDER — TELMISARTAN-HCTZ 80-25 MG PO TABS: 1.0000 | ORAL_TABLET | Freq: Every day | ORAL | 3 refills | Status: DC

## 2016-08-13 NOTE — Telephone Encounter (Signed)
RX sent

## 2016-08-20 NOTE — Telephone Encounter (Signed)
error 

## 2016-09-09 ENCOUNTER — Ambulatory Visit: Payer: Medicare HMO | Admitting: Internal Medicine

## 2016-09-15 NOTE — Progress Notes (Signed)
Pre visit review using our clinic review tool, if applicable. No additional management support is needed unless otherwise documented below in the visit note. 

## 2016-09-15 NOTE — Progress Notes (Addendum)
Subjective:   Jeff Burgess is a 81 y.o. male who presents for Medicare Annual/Subsequent preventive examination.  Review of Systems:  No ROS.  Medicare Wellness Visit. Additional risk factors are reflected in the social history.  Cardiac Risk Factors include: advanced age (>11men, >60 women);diabetes mellitus;hypertension;male gender;smoking/ tobacco exposure;dyslipidemia  Sleep patterns: gets up 1-2 times nightly to void and sleeps 7-8 hours nightly.    Home Safety/Smoke Alarms: Feels safe in home. Smoke alarms in place.  Living environment; residence and Firearm Safety: 1-story house/ trailer, no firearms., Lives with wife, no needs for DME, good support system Seat Belt Safety/Bike Helmet: Wears seat belt.     Objective:    Vitals: BP (!) 146/82   Pulse 83   Temp 98.4 F (36.9 C)   Resp 20   Ht 5\' 9"  (1.753 m)   Wt 196 lb (88.9 kg)   SpO2 98%   BMI 28.94 kg/m   Body mass index is 28.94 kg/m.  Tobacco History  Smoking Status  . Former Smoker  . Years: 15.00  . Types: Cigarettes  . Quit date: 01/18/1971  Smokeless Tobacco  . Current User  . Types: Snuff     Ready to quit: Not Answered Counseling given: Not Answered   Past Medical History:  Diagnosis Date  . Benign localized prostatic hyperplasia with lower urinary tract symptoms (LUTS)   . Bladder cancer [facility]) dx 05/ 2017--  urologist- dr Pilar Jarvis   TCC , Ta of bladder  s/p TURBT 06-02-2015  . CAD (coronary artery disease)    no cardiologist--  followed by pcp,  dr plontnikov  . Chronic low back pain with sciatica   . CKD (chronic kidney disease), stage III   . Diabetes mellitus, type 2 ([place])   . ED (erectile dysfunction)   . Full dentures   . History of ETT    08-09-2014   fair exercise tolerance, no chest pain, normal BP response, no ST changes (baseline RBBB);  negative adequate ETT  . Hyperlipidemia   . Hypertension   . Hypogonadism male   . OA (osteoarthritis)   . Right bundle branch block  (RBBB)   . S/P CABG x 1    1998  . Sigmoid diverticulosis   . Ventral hernia    midline  . Wears glasses    Past Surgical History:  Procedure Laterality Date  . COLONOSCOPY WITH PROPOFOL  02-01-2012  . CORONARY ARTERY BYPASS GRAFT  1998  at [facility]   x1  vessel   . CYSTOSCOPY WITH BIOPSY N/A 10/08/2015   Procedure: CYSTOSCOPY WITH BLADDER BIOPSY;  Surgeon: Nickie Retort, MD;  Location: [facility];  Service: Urology;  Laterality: N/A;  . CYSTOSCOPY WITH FULGERATION N/A 10/08/2015   Procedure: CYSTOSCOPY WITH FULGERATION;  Surgeon: Nickie Retort, MD;  Location: [facility];  Service: Urology;  Laterality: N/A;  . TONSILLECTOMY  age 9  . TRANSURETHRAL RESECTION OF BLADDER TUMOR WITH GYRUS (TURBT-GYRUS) N/A 06/02/2015   Procedure: TRANSURETHRAL RESECTION OF BLADDER TUMOR WITH GYRUS (TURBT-GYRUS);  Surgeon: Nickie Retort, MD;  Location: [facility];  Service: Urology;  Laterality: N/A;   Family History  Problem Relation Age of Onset  . Arthritis Mother 12       RA  . Alcohol abuse Father 15       exposure  . Diabetes Brother   . Kidney disease Brother   . Heart disease Brother   . Hypertension Other   .  Colon cancer Neg Hx   . Esophageal cancer Neg Hx   . Rectal cancer Neg Hx   . Stomach cancer Neg Hx    History  Sexual Activity  . Sexual activity: Not on file    Outpatient Encounter Prescriptions as of 09/16/2016  Medication Sig  . amLODipine (NORVASC) 10 MG tablet Take 0.5 tablets (5 mg total) by mouth daily. (Patient taking differently: Take 5 mg by mouth every morning. )  . aspirin 81 MG tablet Take 2 tablets (162 mg total) by mouth daily.  . Cholecalciferol (VITAMIN D3) 1000 UNITS tablet Take 1,000 Units by mouth every morning.   . metFORMIN (GLUCOPHAGE-XR) 500 MG 24 hr tablet Take 1 tablet (500 mg total) by mouth 2 (two) times daily.  . pravastatin (PRAVACHOL) 40 MG tablet Take 1 tablet (40 mg total)  by mouth daily.  . repaglinide (PRANDIN) 0.5 MG tablet Take 1 tablet (0.5 mg total) by mouth 3 (three) times daily before meals.  . valsartan-hydrochlorothiazide (DIOVAN HCT) 320-25 MG tablet Take 1 tablet by mouth daily.  . [DISCONTINUED] telmisartan-hydrochlorothiazide (MICARDIS HCT) 80-25 MG tablet Take 1 tablet by mouth daily.  . [DISCONTINUED] co-enzyme Q-10 30 MG capsule Take 2 capsules (60 mg total) by mouth 2 (two) times daily. (Patient not taking: Reported on 09/16/2016)  . [DISCONTINUED] Misc Natural Products (GLUCOSAMINE CHOND COMPLEX/MSM) TABS Take by mouth daily.  . [DISCONTINUED] sulfamethoxazole-trimethoprim (BACTRIM DS,SEPTRA DS) 800-160 MG tablet Take 1 tablet by mouth 2 (two) times daily. (Patient not taking: Reported on 09/16/2016)   No facility-administered encounter medications on file as of 09/16/2016.     Activities of Daily Living In your present state of health, do you have any difficulty performing the following activities: 09/16/2016 10/08/2015  Hearing? N Y  Vision? N N  Difficulty concentrating or making decisions? N N  Walking or climbing stairs? N N  Dressing or bathing? N N  Doing errands, shopping? N -  Preparing Food and eating ? N -  Using the Toilet? N -  In the past six months, have you accidently leaked urine? N -  Do you have problems with loss of bowel control? N -  Managing your Medications? N -  Managing your Finances? N -  Housekeeping or managing your Housekeeping? N -  Some recent data might be hidden    Patient Care Team: Plotnikov, Evie Lacks, MD as PCP - General   Assessment:    Physical assessment deferred to PCP.  Exercise Activities and Dietary recommendations Current Exercise Habits: The patient has a physically strenous job, but has no regular exercise apart from work.;Home exercise routine, Type of exercise: yoga;stretching;exercise ball, Time (Minutes): 30, Frequency (Times/Week): 3, Weekly Exercise (Minutes/Week): 90, Intensity: Mild,  Exercise limited by: orthopedic condition(s)  Diet (meal preparation, eat out, water intake, caffeinated beverages, dairy products, fruits and vegetables): in general, a "healthy" diet  , well balanced  Reviewed heart healthy and diabetic diet, encouraged patient to increase daily water intake. Relevant patient education assigned to patient using Emmi. Diet education was attached to patient's AVS.  Goals    . Increase my daily physical activity          Start in the morning walking [employer] and going to the gym several times per week.      Fall Risk Fall Risk  09/16/2016 08/13/2015 05/27/2014  Falls in the past year? No No Yes  Number falls in past yr: - - 1  Injury with Fall? - - No  Depression Screen PHQ 2/9 Scores 09/16/2016 08/13/2015 05/27/2014  PHQ - 2 Score 0 0 0  PHQ- 9 Score 0 - -    Cognitive Function MMSE - Mini Mental State Exam 09/16/2016  Orientation to time 5  Orientation to Place 5  Registration 3  Attention/ Calculation 5  Recall 2  Language- name 2 objects 2  Language- repeat 1  Language- follow 3 step command 3  Language- read & follow direction 1  Write a sentence 1  Copy design 1  Total score 29        Immunization History  Administered Date(s) Administered  . Influenza Split 11/11/2011  . Influenza Whole 11/05/2005  . Influenza, High Dose Seasonal PF 12/08/2015, 09/16/2016  . Influenza,inj,Quad PF,6+ Mos 10/05/2012, 09/30/2014  . Pneumococcal Conjugate-13 04/04/2013  . Pneumococcal Polysaccharide-23 07/02/2005  . Td 11/11/2011   Screening Tests Health Maintenance  Topic Date Due  . OPHTHALMOLOGY EXAM  03/18/1945  . FOOT EXAM  01/19/2015  . INFLUENZA VACCINE  08/11/2016  . TETANUS/TDAP  11/10/2021  . PNA vac Low Risk Adult  Completed      Plan:  Continue doing brain stimulating activities (puzzles, reading, adult coloring books, staying active) to keep memory sharp.   Continue to eat heart healthy diet (full of fruits, vegetables, whole grains,  lean protein, water--limit salt, fat, and sugar intake) and increase physical activity as tolerated.   I have personally reviewed and noted the following in the patient's chart:   . Medical and social history . Use of alcohol, tobacco or illicit drugs  . Current medications and supplements . Functional ability and status . Nutritional status . Physical activity . Advanced directives . List of other physicians . Vitals . Screenings to include cognitive, depression, and falls . Referrals and appointments  In addition, I have reviewed and discussed with patient certain preventive protocols, quality metrics, and best practice recommendations. A written personalized care plan for preventive services as well as general preventive health recommendations were provided to patient.     Michiel Cowboy, RN  09/16/2016  Medical screening examination/treatment/procedure(s) were performed by non-physician practitioner and as supervising physician I was immediately available for consultation/collaboration. I agree with above. Lew Dawes, MD

## 2016-09-16 ENCOUNTER — Other Ambulatory Visit (INDEPENDENT_AMBULATORY_CARE_PROVIDER_SITE_OTHER): Payer: Medicare HMO

## 2016-09-16 ENCOUNTER — Encounter: Payer: Self-pay | Admitting: Internal Medicine

## 2016-09-16 ENCOUNTER — Ambulatory Visit (INDEPENDENT_AMBULATORY_CARE_PROVIDER_SITE_OTHER): Payer: Medicare HMO | Admitting: Internal Medicine

## 2016-09-16 VITALS — BP 146/82 | HR 83 | Temp 98.4°F | Resp 20 | Ht 69.0 in | Wt 196.0 lb

## 2016-09-16 DIAGNOSIS — E1122 Type 2 diabetes mellitus with diabetic chronic kidney disease: Secondary | ICD-10-CM

## 2016-09-16 DIAGNOSIS — I10 Essential (primary) hypertension: Secondary | ICD-10-CM | POA: Diagnosis not present

## 2016-09-16 DIAGNOSIS — E785 Hyperlipidemia, unspecified: Secondary | ICD-10-CM | POA: Diagnosis not present

## 2016-09-16 DIAGNOSIS — M5440 Lumbago with sciatica, unspecified side: Secondary | ICD-10-CM | POA: Diagnosis not present

## 2016-09-16 DIAGNOSIS — Z0000 Encounter for general adult medical examination without abnormal findings: Secondary | ICD-10-CM

## 2016-09-16 DIAGNOSIS — Z23 Encounter for immunization: Secondary | ICD-10-CM

## 2016-09-16 DIAGNOSIS — I2510 Atherosclerotic heart disease of native coronary artery without angina pectoris: Secondary | ICD-10-CM

## 2016-09-16 DIAGNOSIS — N183 Chronic kidney disease, stage 3 unspecified: Secondary | ICD-10-CM

## 2016-09-16 LAB — BASIC METABOLIC PANEL
BUN: 20 mg/dL (ref 6–23)
CALCIUM: 9.7 mg/dL (ref 8.4–10.5)
CO2: 30 meq/L (ref 19–32)
CREATININE: 1.43 mg/dL (ref 0.40–1.50)
Chloride: 101 mEq/L (ref 96–112)
GFR: 50.64 mL/min — ABNORMAL LOW (ref >60.00)
GLUCOSE: 187 mg/dL — AB (ref 70–99)
Potassium: 4.7 mEq/L (ref 3.5–5.1)
SODIUM: 140 meq/L (ref 135–145)

## 2016-09-16 LAB — HEMOGLOBIN A1C: Hgb A1c MFr Bld: 6.6 % — ABNORMAL HIGH (ref 4.6–6.5)

## 2016-09-16 NOTE — Patient Instructions (Addendum)
Continue doing brain stimulating activities (puzzles, reading, adult coloring books, staying active) to keep memory sharp.   Continue to eat heart healthy diet (full of fruits, vegetables, whole grains, lean protein, water--limit salt, fat, and sugar intake) and increase physical activity as tolerated.   Jeff Burgess , Thank you for taking time to come for your Medicare Wellness Visit. I appreciate your ongoing commitment to your health goals. Please review the following plan we discussed and let me know if I can assist you in the future.   These are the goals we discussed: Goals    . Increase my daily physical activity          Start in the morning walking [employer] and going to the gym several times per week.       This is a list of the screening recommended for you and due dates:  Health Maintenance  Topic Date Due  . Eye exam for diabetics  03/18/1945  . Complete foot exam   01/19/2015  . Flu Shot  08/11/2016  . Tetanus Vaccine  11/10/2021  . Pneumonia vaccines  Completed     Reading Food Labels Foods that are packaged or in containers have a Nutrition Facts panel on the side or back. This is commonly called the food label. The food label helps you make healthy food choices by providing information about serving size and the amount of calories and various nutrients in the food. You can check the food label to find out if the food contains high or low amounts of nutrients that you want to limit in your diet. You can also use the food label to see if the food is a good source of the nutrients that you want to make sure are included in your diet. How do I read the food label?  Begin by checking the serving size and number of servings in the container. All of the nutrition information listed on the food label is based on one serving. If you eat more than one serving, you must multiply the amounts (such as calories, grams of saturated fat, or milligrams of sodium) by the number of  servings.  Check the calories. Choosing foods that are low in calories can help you manage your weight.  Look at the numbers in the % Daily Value column for each listed nutrient. This gives you an idea of how much of the daily recommended amount for that nutrient is provided in one serving of the food. A daily value of 5% or less is considered low. A daily value of 20% or more is considered high.  Check the amounts for the items you should limit in your diet. These include: ? Total fat. ? Saturated fat. ? Trans fat. ? Cholesterol. ? Sodium.  Check the amounts for the items you should make sure you get enough of. These include: ? Dietary fiber. ? Vitamins A and C. ? Calcium. ? Iron. What information is provided on the food label? Serving information  Serving size. ? The serving size is listed in cups or pieces. The nutrient amounts listed on the food label apply to this amount of the food.  Servings per container or package. ? This shows the number of servings you can expect to get from the container or package if you follow the suggested serving size. Amount per serving  Calories. ? The number of calories in one serving of the food. This information is helpful in managing weight. Low-calorie foods contain 40 calories or  less. High-calorie foods contain 400 or more calories.  Calories from fat. ? The number of calories that come from fat in one serving. Percent daily value Percent daily value (shown on the label as % Daily Value) tells you what percent of the daily value for each nutrient one serving provides. The daily value is the recommended amount of the nutrient that you should get each day. For example, if 15% is listed next to dietary fiber, it means that one serving of the food will give you 15% of the recommended amount of fiber that you should get in a day. The daily values are based on a 2,000-calorie-per-day diet. You may get more or less than 2,000 calories in your diet  each day, but the % Daily Value gives you an idea of whether the food contains a high or low amount of the listed nutrient. A daily value of 5% or less is low. A daily value of 20% or more is high. Total fat Total fat shows you the total amount of fat in one serving (listed in grams). Foods with high amounts of fat usually have higher calories and may lead to weight gain. Two of the fats that make up a portion of the total fat are included on the label:  Saturated fat. ? This number is the amount of saturated fat in one serving (listed in grams). Saturated fat increases the amount of blood cholesterol and should be limited to less than 7% of total calories each day. This means that if you eat 2,000 calories each day, you should eat less than 140 calories from saturated fat.    Cholesterol The amount of cholesterol in one serving is listed in milligrams. Cholesterol should be limited to no more than 300 mg each day. Sodium The amount of sodium in one serving is listed in milligrams. Most people should limit their sodium intake to 2,300 mg a day. Total carbohydrate This number shows the amount of total carbohydrate in one serving (listed in grams). This information can help people with diabetes manage the amount of carbohydrate they eat. Two of the carbohydrates that make up a portion of the total carbohydrate are included on the label:  Dietary fiber. ? The amount of dietary fiber in one serving is listed in grams. Most people should eat at least 25 g of dietary fiber each day.  Sugars. ? The amount of sugar in one serving is also listed in grams. This value includes both naturally occurring sugars from fruit and milk and added sugars such as honey or table sugar.  Protein The amount of protein in one serving is listed in grams. What other important labeling is on the food package? Ingredients Food labels will list each ingredient in the food. The first ingredient listed is the ingredient  that the food has the most of. The ingredients are listed in the order of their amount by weight from highest to lowest. Food allergen labeling Food labels may also include a food allergen warning. Listed here are ingredients that can cause allergic reactions in some people. The potential allergens are listed behind the word "Contains" or "May contain." Examples of ingredients that may be listed are wheat, dairy, eggs, soy, and nuts. If a person knows that he or she is allergic to one of these ingredients, he or she will know to avoid that food. Where to find more information:  [place]. Food and Drug Administration: [employer] This information is not intended to replace advice given to  you by your health care provider. Make sure you discuss any questions you have with your health care provider. Document Released: 12/28/2004 Document Revised: 08/27/2015 Document Reviewed: 11/20/2012 Elsevier Interactive Patient Education  2017 [employer].

## 2016-09-16 NOTE — Progress Notes (Signed)
Subjective:  Patient ID: Jeff Burgess, male    DOB: [birth year]  Age: 81 y.o. MRN: [id number]  CC: Medicare Wellness and Follow-up   HPI MIKEAL WINSTANLEY presents for well exam F/u DM, HTN, CAD. ?about Diovan-HCT  Outpatient Medications Prior to Visit  Medication Sig Dispense Refill  . amLODipine (NORVASC) 10 MG tablet Take 0.5 tablets (5 mg total) by mouth daily. (Patient taking differently: Take 5 mg by mouth every morning. ) 90 tablet 3  . aspirin 81 MG tablet Take 2 tablets (162 mg total) by mouth daily. 100 tablet 3  . Cholecalciferol (VITAMIN D3) 1000 UNITS tablet Take 1,000 Units by mouth every morning.     . metFORMIN (GLUCOPHAGE-XR) 500 MG 24 hr tablet Take 1 tablet (500 mg total) by mouth 2 (two) times daily. 180 tablet 1  . pravastatin (PRAVACHOL) 40 MG tablet Take 1 tablet (40 mg total) by mouth daily. 90 tablet 3  . repaglinide (PRANDIN) 0.5 MG tablet Take 1 tablet (0.5 mg total) by mouth 3 (three) times daily before meals. 90 tablet 11  . telmisartan-hydrochlorothiazide (MICARDIS HCT) 80-25 MG tablet Take 1 tablet by mouth daily. 90 tablet 3  . valsartan-hydrochlorothiazide (DIOVAN HCT) 320-25 MG tablet Take 1 tablet by mouth daily. 90 tablet 3  . co-enzyme Q-10 30 MG capsule Take 2 capsules (60 mg total) by mouth 2 (two) times daily. (Patient not taking: Reported on 09/16/2016) 60 capsule 11  . Misc Natural Products (GLUCOSAMINE CHOND COMPLEX/MSM) TABS Take by mouth daily.    [name] sulfamethoxazole-trimethoprim (BACTRIM DS,SEPTRA DS) 800-160 MG tablet Take 1 tablet by mouth 2 (two) times daily. (Patient not taking: Reported on 09/16/2016) 10 tablet 0   No facility-administered medications prior to visit.     ROS Review of Systems  Constitutional: Positive for fatigue. Negative for appetite change and unexpected weight change.  HENT: Negative for congestion, nosebleeds, sneezing, sore throat and trouble swallowing.   Eyes: Negative for itching and visual disturbance.    Respiratory: Negative for cough.   Cardiovascular: Negative for chest pain, palpitations and leg swelling.  Gastrointestinal: Negative for abdominal distention, blood in stool, diarrhea and nausea.  Genitourinary: Negative for frequency and hematuria.  Musculoskeletal: Positive for arthralgias and back pain. Negative for gait problem, joint swelling and neck pain.  Skin: Negative for rash.  Neurological: Negative for dizziness, tremors, speech difficulty and weakness.  Psychiatric/Behavioral: Negative for agitation, dysphoric mood and sleep disturbance. The patient is not nervous/anxious.     Objective:  BP (!) 146/82   Pulse 83   Temp 98.4 F (36.9 C)   Resp 20   Ht 5\' 9"  (1.753 m)   Wt 196 lb (88.9 kg)   SpO2 98%   BMI 28.94 kg/m   BP Readings from Last 3 Encounters:  09/16/16 (!) 146/82  06/08/16 (!) 160/80  03/09/16 (!) 148/68    Wt Readings from Last 3 Encounters:  09/16/16 196 lb (88.9 kg)  06/08/16 199 lb (90.3 kg)  03/09/16 202 lb (91.6 kg)    Physical Exam  Constitutional: He is oriented to person, place, and time. He appears well-developed. No distress.  NAD  HENT:  Mouth/Throat: Oropharynx is clear and moist.  Eyes: Pupils are equal, round, and reactive to light. Conjunctivae are normal.  Neck: Normal range of motion. No JVD present. No thyromegaly present.  Cardiovascular: Normal rate, regular rhythm, normal heart sounds and intact distal pulses.  Exam reveals no gallop and no friction rub.   No murmur  heard. Pulmonary/Chest: Effort normal and breath sounds normal. No respiratory distress. He has no wheezes. He has no rales. He exhibits no tenderness.  Abdominal: Soft. Bowel sounds are normal. He exhibits no distension and no mass. There is no tenderness. There is no rebound and no guarding.  Musculoskeletal: Normal range of motion. He exhibits no edema or tenderness.  Lymphadenopathy:    He has no cervical adenopathy.  Neurological: He is alert and  oriented to person, place, and time. He has normal reflexes. No cranial nerve deficit. He exhibits normal muscle tone. He displays a negative Romberg sign. Coordination and gait normal.  Skin: Skin is warm and dry. No rash noted.  Psychiatric: He has a normal mood and affect. His behavior is normal. Judgment and thought content normal.    Lab Results  Component Value Date   WBC 10.2 10/08/2015   HGB 15.7 10/08/2015   HCT 45.3 10/08/2015   PLT 211 10/08/2015   GLUCOSE 154 (H) 06/08/2016   CHOL 122 10/03/2012   TRIG 166.0 (H) 10/03/2012   HDL 29.20 (L) 10/03/2012   LDLDIRECT 78.4 11/02/2011   LDLCALC 60 10/03/2012   ALT 16 08/11/2015   AST 14 08/11/2015   NA 138 06/08/2016   K 4.5 06/08/2016   CL 100 06/08/2016   CREATININE 1.38 06/08/2016   BUN 21 06/08/2016   CO2 29 06/08/2016   TSH 1.53 10/03/2012   PSA 1.48 10/03/2012   INR 1.02 10/08/2015   HGBA1C 6.8 (H) 06/08/2016    No results found.  Assessment & Plan:   Kyng was seen today for medicare wellness and follow-up.  Diagnoses and all orders for this visit:  Encounter for immunization -     Flu vaccine HIGH DOSE PF   I have discontinued Mr. Gerads's GLUCOSAMINE CHOND COMPLEX/MSM, sulfamethoxazole-trimethoprim, and co-enzyme Q-10. I am also having him maintain his cholecalciferol, amLODipine, pravastatin, valsartan-hydrochlorothiazide, aspirin, repaglinide, metFORMIN, and telmisartan-hydrochlorothiazide.  No orders of the defined types were placed in this encounter.    Follow-up: No Follow-up on file.  Walker Kehr, MD

## 2016-09-16 NOTE — Assessment & Plan Note (Signed)

## 2016-09-16 NOTE — Assessment & Plan Note (Signed)
Labs  Fasting 24 h a week - hold Prandin and use 1 Metformin if fasting

## 2016-09-16 NOTE — Assessment & Plan Note (Signed)
[facility] Pt wants to stay on Diovan HCT

## 2016-09-16 NOTE — Assessment & Plan Note (Signed)
ASA, Amlodipine, Lovastatin

## 2016-09-29 NOTE — Telephone Encounter (Addendum)
Pt said that this prescription was very expensive. He heard that the Valsartan recall has been removed. He called the pharmacy to have this filled and they said that a new script would have to be sent in. Please advise.  - He said that the pharmacy told him they are getting the Valsartan from another manufacturer.

## 2016-09-30 NOTE — Telephone Encounter (Signed)
Okay to do?

## 2016-10-01 MED ORDER — VALSARTAN-HYDROCHLOROTHIAZIDE 320-25 MG PO TABS: 1.0000 | ORAL_TABLET | Freq: Every day | ORAL | 3 refills | Status: DC

## 2016-10-01 NOTE — Telephone Encounter (Signed)
LM notifying pt

## 2016-10-01 NOTE — Telephone Encounter (Signed)
OK to go back on Valsartan Thx

## 2016-10-01 NOTE — Addendum Note (Signed)
Addended by: Cassandria Anger on: 10/01/2016 08:18 AM   Modules accepted: Orders

## 2016-11-08 DIAGNOSIS — C672 Malignant neoplasm of lateral wall of bladder: Secondary | ICD-10-CM | POA: Diagnosis not present

## 2016-11-29 ENCOUNTER — Telehealth: Payer: Self-pay | Admitting: Internal Medicine

## 2016-11-29 MED ORDER — METFORMIN HCL ER 500 MG PO TB24: 500.0000 mg | ORAL_TABLET | Freq: Two times a day (BID) | ORAL | 1 refills | Status: DC

## 2016-11-29 NOTE — Telephone Encounter (Signed)
Reviewed chart pt is up-to-date sent refills to [facility].../[facility]  

## 2016-11-29 NOTE — Telephone Encounter (Signed)
Pt called and needs a refill of  metFORMIN (GLUCOPHAGE-XR) 500 MG 24 hr tablet  Please advise

## 2016-12-16 ENCOUNTER — Other Ambulatory Visit (INDEPENDENT_AMBULATORY_CARE_PROVIDER_SITE_OTHER): Payer: Medicare HMO

## 2016-12-16 ENCOUNTER — Encounter: Payer: Self-pay | Admitting: Internal Medicine

## 2016-12-16 ENCOUNTER — Ambulatory Visit: Payer: Medicare HMO | Admitting: Internal Medicine

## 2016-12-16 VITALS — BP 142/76 | HR 91 | Temp 97.6°F | Ht 69.0 in | Wt 202.0 lb

## 2016-12-16 DIAGNOSIS — I2510 Atherosclerotic heart disease of native coronary artery without angina pectoris: Secondary | ICD-10-CM

## 2016-12-16 DIAGNOSIS — I10 Essential (primary) hypertension: Secondary | ICD-10-CM

## 2016-12-16 DIAGNOSIS — E785 Hyperlipidemia, unspecified: Secondary | ICD-10-CM

## 2016-12-16 DIAGNOSIS — M5440 Lumbago with sciatica, unspecified side: Secondary | ICD-10-CM | POA: Diagnosis not present

## 2016-12-16 DIAGNOSIS — D485 Neoplasm of uncertain behavior of skin: Secondary | ICD-10-CM | POA: Diagnosis not present

## 2016-12-16 DIAGNOSIS — Z23 Encounter for immunization: Secondary | ICD-10-CM | POA: Diagnosis not present

## 2016-12-16 DIAGNOSIS — N183 Chronic kidney disease, stage 3 unspecified: Secondary | ICD-10-CM

## 2016-12-16 DIAGNOSIS — E1122 Type 2 diabetes mellitus with diabetic chronic kidney disease: Secondary | ICD-10-CM

## 2016-12-16 DIAGNOSIS — N259 Disorder resulting from impaired renal tubular function, unspecified: Secondary | ICD-10-CM | POA: Diagnosis not present

## 2016-12-16 LAB — HEMOGLOBIN A1C: Hgb A1c MFr Bld: 6.8 % — ABNORMAL HIGH (ref 4.6–6.5)

## 2016-12-16 LAB — BASIC METABOLIC PANEL
BUN: 15 mg/dL (ref 6–23)
CALCIUM: 9.6 mg/dL (ref 8.4–10.5)
CHLORIDE: 101 meq/L (ref 96–112)
CO2: 31 meq/L (ref 19–32)
CREATININE: 1.33 mg/dL (ref 0.40–1.50)
GFR: 55.02 mL/min — ABNORMAL LOW (ref >60.00)
Glucose, Bld: 157 mg/dL — ABNORMAL HIGH (ref 70–99)
Potassium: 4.5 mEq/L (ref 3.5–5.1)
Sodium: 141 mEq/L (ref 135–145)

## 2016-12-16 MED ORDER — METFORMIN HCL ER 500 MG PO TB24: 500.0000 mg | ORAL_TABLET | Freq: Two times a day (BID) | ORAL | 3 refills | Status: DC

## 2016-12-16 MED ORDER — REPAGLINIDE 0.5 MG PO TABS: 0.5000 mg | ORAL_TABLET | Freq: Three times a day (TID) | ORAL | 3 refills | Status: DC

## 2016-12-16 MED ORDER — AMLODIPINE BESYLATE 10 MG PO TABS: 5.0000 mg | ORAL_TABLET | Freq: Every day | ORAL | 3 refills | Status: DC

## 2016-12-16 MED ORDER — VALSARTAN-HYDROCHLOROTHIAZIDE 320-25 MG PO TABS: 1.0000 | ORAL_TABLET | Freq: Every day | ORAL | 3 refills | Status: DC

## 2016-12-16 MED ORDER — PRAVASTATIN SODIUM 40 MG PO TABS: 40.0000 mg | ORAL_TABLET | Freq: Every day | ORAL | 3 refills | Status: DC

## 2016-12-16 NOTE — Assessment & Plan Note (Signed)
ASA, Amlodipine, Lovastatin

## 2016-12-16 NOTE — Assessment & Plan Note (Signed)
Derm ref

## 2016-12-16 NOTE — Assessment & Plan Note (Addendum)
On Metformin, Prandin 

## 2016-12-16 NOTE — Assessment & Plan Note (Signed)
Labs

## 2016-12-16 NOTE — Assessment & Plan Note (Signed)
Diovan-HCT 

## 2016-12-16 NOTE — Progress Notes (Signed)
Subjective:  Patient ID: Jeff Burgess, male    DOB: 05-26-1935  Age: 81 y.o. MRN: [id number]  CC: No chief complaint on file.   HPI Jeff Burgess presents for HTN, DM, CAD f/u  Outpatient Medications Prior to Visit  Medication Sig Dispense Refill  . amLODipine (NORVASC) 10 MG tablet Take 0.5 tablets (5 mg total) by mouth daily. (Patient taking differently: Take 5 mg by mouth every morning. ) 90 tablet 3  . aspirin 81 MG tablet Take 2 tablets (162 mg total) by mouth daily. 100 tablet 3  . Cholecalciferol (VITAMIN D3) 1000 UNITS tablet Take 1,000 Units by mouth every morning.     . metFORMIN (GLUCOPHAGE-XR) 500 MG 24 hr tablet Take 1 tablet (500 mg total) 2 (two) times daily by mouth. 180 tablet 1  . pravastatin (PRAVACHOL) 40 MG tablet Take 1 tablet (40 mg total) by mouth daily. 90 tablet 3  . repaglinide (PRANDIN) 0.5 MG tablet Take 1 tablet (0.5 mg total) by mouth 3 (three) times daily before meals. 90 tablet 11  . valsartan-hydrochlorothiazide (DIOVAN HCT) 320-25 MG tablet Take 1 tablet by mouth daily. 90 tablet 3   No facility-administered medications prior to visit.     ROS Review of Systems  Constitutional: Negative for appetite change, fatigue and unexpected weight change.  HENT: Negative for congestion, nosebleeds, sneezing, sore throat and trouble swallowing.   Eyes: Negative for itching and visual disturbance.  Respiratory: Negative for cough.   Cardiovascular: Negative for chest pain, palpitations and leg swelling.  Gastrointestinal: Negative for abdominal distention, blood in stool, diarrhea and nausea.  Genitourinary: Negative for frequency and hematuria.  Musculoskeletal: Positive for arthralgias. Negative for back pain, gait problem, joint swelling and neck pain.  Skin: Negative for rash.  Neurological: Negative for dizziness, tremors, speech difficulty and weakness.  Psychiatric/Behavioral: Negative for agitation, dysphoric mood and sleep disturbance. The  patient is not nervous/anxious.     Objective:  BP (!) 142/76 (BP Location: Left Arm, Patient Position: Sitting, Cuff Size: Large)   Pulse 91   Temp 97.6 F (36.4 C) (Oral)   Ht 5\' 9"  (1.753 m)   Wt 202 lb (91.6 kg)   SpO2 99%   BMI 29.83 kg/m   BP Readings from Last 3 Encounters:  12/16/16 (!) 142/76  09/16/16 (!) 146/82  06/08/16 (!) 160/80    Wt Readings from Last 3 Encounters:  12/16/16 202 lb (91.6 kg)  09/16/16 196 lb (88.9 kg)  06/08/16 199 lb (90.3 kg)    Physical Exam  Constitutional: He is oriented to person, place, and time. He appears well-developed. No distress.  NAD  HENT:  Mouth/Throat: Oropharynx is clear and moist.  Eyes: Conjunctivae are normal. Pupils are equal, round, and reactive to light.  Neck: Normal range of motion. No JVD present. No thyromegaly present.  Cardiovascular: Normal rate, regular rhythm, normal heart sounds and intact distal pulses. Exam reveals no gallop and no friction rub.  No murmur heard. Pulmonary/Chest: Effort normal and breath sounds normal. No respiratory distress. He has no wheezes. He has no rales. He exhibits no tenderness.  Abdominal: Soft. Bowel sounds are normal. He exhibits no distension and no mass. There is no tenderness. There is no rebound and no guarding.  Musculoskeletal: Normal range of motion. He exhibits no edema or tenderness.  Lymphadenopathy:    He has no cervical adenopathy.  Neurological: He is alert and oriented to person, place, and time. He has normal reflexes. No cranial nerve  deficit. He exhibits normal muscle tone. He displays a negative Romberg sign. Coordination and gait normal.  Skin: Skin is warm and dry. No rash noted.  Psychiatric: He has a normal mood and affect. His behavior is normal. Judgment and thought content normal.   Skin lesions  Lab Results  Component Value Date   WBC 10.2 10/08/2015   HGB 15.7 10/08/2015   HCT 45.3 10/08/2015   PLT 211 10/08/2015   GLUCOSE 187 (H) 09/16/2016    CHOL 122 10/03/2012   TRIG 166.0 (H) 10/03/2012   HDL 29.20 (L) 10/03/2012   LDLDIRECT 78.4 11/02/2011   LDLCALC 60 10/03/2012   ALT 16 08/11/2015   AST 14 08/11/2015   NA 140 09/16/2016   K 4.7 09/16/2016   CL 101 09/16/2016   CREATININE 1.43 09/16/2016   BUN 20 09/16/2016   CO2 30 09/16/2016   TSH 1.53 10/03/2012   PSA 1.48 10/03/2012   INR 1.02 10/08/2015   HGBA1C 6.6 (H) 09/16/2016    No results found.  Assessment & Plan:   There are no diagnoses linked to this encounter. I am having Jeff Burgess. Jeff Burgess maintain his cholecalciferol, amLODipine, pravastatin, aspirin, repaglinide, valsartan-hydrochlorothiazide, and metFORMIN.  No orders of the defined types were placed in this encounter.    Follow-up: No Follow-up on file.  Walker Kehr, MD

## 2016-12-16 NOTE — Assessment & Plan Note (Signed)
On Lovastatin

## 2017-02-28 DIAGNOSIS — C672 Malignant neoplasm of lateral wall of bladder: Secondary | ICD-10-CM | POA: Diagnosis not present

## 2017-03-07 ENCOUNTER — Encounter (INDEPENDENT_AMBULATORY_CARE_PROVIDER_SITE_OTHER): Payer: Self-pay | Admitting: Physician Assistant

## 2017-03-07 ENCOUNTER — Ambulatory Visit (INDEPENDENT_AMBULATORY_CARE_PROVIDER_SITE_OTHER): Payer: Medicare HMO

## 2017-03-07 ENCOUNTER — Ambulatory Visit (INDEPENDENT_AMBULATORY_CARE_PROVIDER_SITE_OTHER): Payer: Medicare HMO | Admitting: Physician Assistant

## 2017-03-07 VITALS — Ht 69.0 in | Wt 200.0 lb

## 2017-03-07 DIAGNOSIS — S76319A Strain of muscle, fascia and tendon of the posterior muscle group at thigh level, unspecified thigh, initial encounter: Secondary | ICD-10-CM

## 2017-03-07 DIAGNOSIS — M25551 Pain in right hip: Secondary | ICD-10-CM

## 2017-03-07 DIAGNOSIS — M722 Plantar fascial fibromatosis: Secondary | ICD-10-CM | POA: Diagnosis not present

## 2017-03-07 NOTE — Progress Notes (Signed)
Office Visit Note   Patient: Jeff Burgess           Date of Birth: Oct 17, 1935           MRN: [id number] Visit Date: 03/07/2017              Requested by: Cassandria Anger, MD [place], [place] [address] PCP: Cassandria Anger, MD   Assessment & Plan: Visit Diagnoses:  1. Pain in right hip   2. Plantar fasciitis   3. Hamstring muscle strain, initial encounter     Plan: Discussed with him at length shoe wear, stretching exercises particularly gastrocsoleus and hamstring stretching exercises.  He was given some back hamstring and gastrocsoleus stretching exercise handouts.  We will also send him formal physical therapy for stretching and modalities to the right plantar fascia in the right hamstring region.  Did not place him on any NSAIDs or Medrol Dosepak due to his chronic kidney disease and diabetes respectively.  Questions were encouraged and answered at length.  His pain persist or becomes worse he will follow up.  Follow-Up Instructions: Return if symptoms worsen or fail to improve.   Orders:  Orders Placed This Encounter  Procedures  . XR Pelvis 1-2 Views   No orders of the defined types were placed in this encounter.     Procedures: No procedures performed   Clinical Data: No additional findings.   Subjective: Chief Complaint  Patient presents with  . Right Leg - Pain  . Right Foot - Pain  . Neck - Pain    HPI Jeff Burgess is a 82 year old male seen for the first time for right posterior thigh pain.  He noticed it after walking on a treadmill no particular injury.  This tried some stretching exercises pain is not gone away.  He also notices the pain in his posterior thigh whenever he is driving for prolonged period of time.  He denies any numbness or tingling down the leg.  He has diabetic reports that his diabetes is under good control.  He is also has chronic kidney disease. His other complaint is right foot pain which is worse whenever he  first stands in the morning gets better after a few steps.  He reports dropping a bowel on his right foot in the 1990s.  He states his foot feels as if it has a red hot poker in the plantar aspect of the foot. Review of Systems Please see HPI otherwise negative  Objective: Vital Signs: Ht 5\' 9"  (1.753 m)   Wt 200 lb (90.7 kg)   BMI 29.53 kg/m   Physical Exam  Constitutional: He is oriented to person, place, and time. He appears well-developed and well-nourished. No distress.  Cardiovascular: Intact distal pulses.  Pulmonary/Chest: Effort normal.  Neurological: He is alert and oriented to person, place, and time.  Skin: He is not diaphoretic.  Psychiatric: He has a normal mood and affect.    Ortho Exam Right foot no rashes skin lesions ulceration.  He has some dorsal bossing medial midfoot.  No tenderness over the posterior tibial tendon peroneal tendons 5 out of 5 strength inversion eversion against resistance without pain.  Nontender about the Achilles.  Tenderness over the plantar fascia right foot only.  Bilateral gastrocs are tight.  Has good range of motion of both hips without pain.  Nontender over the trochanteric region both hips.  Tenderness proximal right hamstring region.  No bruising or rashes posterior aspect of  the right thigh. Specialty Comments:  No specialty comments available.  Imaging: Xr Pelvis 1-2 Views  Result Date: 03/07/2017 AP pelvis: No acute fractures.  Both hips are well located and well preserved without significant arthritic changes.    PMFS History: Patient Active Problem List   Diagnosis Date Noted  . Double vision 03/09/2016  . Gait disorder 12/08/2015  . Dysuria 10/28/2015  . Bladder cancer ([place]) 08/13/2015  . Sialoadenitis 01/01/2015  . RBBB 07/12/2014  . Pyogenic granuloma 02/06/2013  . Well adult exam 11/11/2011  . DM (diabetes mellitus), type 2 with renal complications ([place]) 87/56/4332  . Hypogonadism male 03/10/2011  . Fatigue  11/10/2010  . Hyperkalemia 07/06/2010  . Neoplasm of uncertain behavior of skin 10/14/2009  . UNS ADVRS EFF UNS RX MEDICINAL&BIOLOGICAL SBSTNC 04/25/2008  . SEBORRHEIC DERMATITIS 12/28/2007  . ACTINIC KERATOSIS 12/28/2007  . Disorder resulting from impaired renal function 09/30/2006  . LOW BACK PAIN 09/30/2006  . Dyslipidemia 07/26/2006  . Essential hypertension 07/26/2006  . Coronary atherosclerosis 07/26/2006  . OSTEOARTHRITIS 07/26/2006   Past Medical History:  Diagnosis Date  . Benign localized prostatic hyperplasia with lower urinary tract symptoms (LUTS)   . Bladder cancer [facility]) dx 05/ 2017--  urologist- dr Pilar Jarvis   TCC , Ta of bladder  s/p TURBT 06-02-2015  . CAD (coronary artery disease)    no cardiologist--  followed by pcp,  dr plontnikov  . Chronic low back pain with sciatica   . CKD (chronic kidney disease), stage III ([place])   . Diabetes mellitus, type 2 ([place])   . ED (erectile dysfunction)   . Full dentures   . History of ETT    08-09-2014   fair exercise tolerance, no chest pain, normal BP response, no ST changes (baseline RBBB);  negative adequate ETT  . Hyperlipidemia   . Hypertension   . Hypogonadism male   . OA (osteoarthritis)   . Right bundle branch block (RBBB)   . S/P CABG x 1    1998  . Sigmoid diverticulosis   . Ventral hernia    midline  . Wears glasses     Family History  Problem Relation Age of Onset  . Arthritis Mother 107       RA  . Alcohol abuse Father 7       exposure  . Diabetes Brother   . Kidney disease Brother   . Heart disease Brother   . Hypertension Other   . Colon cancer Neg Hx   . Esophageal cancer Neg Hx   . Rectal cancer Neg Hx   . Stomach cancer Neg Hx     Past Surgical History:  Procedure Laterality Date  . COLONOSCOPY WITH PROPOFOL  02-01-2012  . CORONARY ARTERY BYPASS GRAFT  1998  [facility]   x1  vessel   . CYSTOSCOPY WITH BIOPSY N/A 10/08/2015   Procedure: CYSTOSCOPY WITH BLADDER BIOPSY;  Surgeon:  Nickie Retort, MD;  Location: [facility];  Service: Urology;  Laterality: N/A;  . CYSTOSCOPY WITH FULGERATION N/A 10/08/2015   Procedure: CYSTOSCOPY WITH FULGERATION;  Surgeon: Nickie Retort, MD;  Location: [facility];  Service: Urology;  Laterality: N/A;  . TONSILLECTOMY  age 29  . TRANSURETHRAL RESECTION OF BLADDER TUMOR WITH GYRUS (TURBT-GYRUS) N/A 06/02/2015   Procedure: TRANSURETHRAL RESECTION OF BLADDER TUMOR WITH GYRUS (TURBT-GYRUS);  Surgeon: Nickie Retort, MD;  Location: [facility];  Service: Urology;  Laterality: N/A;   Social History  Occupational History  . Occupation: retired    Comment: but working fulltime  Tobacco Use  . Smoking status: Former Smoker    Years: 15.00    Types: Cigarettes    Last attempt to quit: 01/18/1971    Years since quitting: 46.1  . Smokeless tobacco: Current User    Types: Snuff  Substance and Sexual Activity  . Alcohol use: No  . Drug use: No  . Sexual activity: Not on file

## 2017-03-16 ENCOUNTER — Other Ambulatory Visit (INDEPENDENT_AMBULATORY_CARE_PROVIDER_SITE_OTHER): Payer: Medicare HMO

## 2017-03-16 ENCOUNTER — Encounter: Payer: Self-pay | Admitting: Internal Medicine

## 2017-03-16 ENCOUNTER — Ambulatory Visit (INDEPENDENT_AMBULATORY_CARE_PROVIDER_SITE_OTHER): Payer: Medicare HMO | Admitting: Internal Medicine

## 2017-03-16 DIAGNOSIS — E1122 Type 2 diabetes mellitus with diabetic chronic kidney disease: Secondary | ICD-10-CM

## 2017-03-16 DIAGNOSIS — I2510 Atherosclerotic heart disease of native coronary artery without angina pectoris: Secondary | ICD-10-CM

## 2017-03-16 DIAGNOSIS — M5440 Lumbago with sciatica, unspecified side: Secondary | ICD-10-CM | POA: Diagnosis not present

## 2017-03-16 DIAGNOSIS — I10 Essential (primary) hypertension: Secondary | ICD-10-CM

## 2017-03-16 DIAGNOSIS — C678 Malignant neoplasm of overlapping sites of bladder: Secondary | ICD-10-CM | POA: Diagnosis not present

## 2017-03-16 DIAGNOSIS — E785 Hyperlipidemia, unspecified: Secondary | ICD-10-CM | POA: Diagnosis not present

## 2017-03-16 DIAGNOSIS — N189 Chronic kidney disease, unspecified: Secondary | ICD-10-CM

## 2017-03-16 DIAGNOSIS — N183 Chronic kidney disease, stage 3 (moderate): Secondary | ICD-10-CM

## 2017-03-16 DIAGNOSIS — G8929 Other chronic pain: Secondary | ICD-10-CM | POA: Diagnosis not present

## 2017-03-16 LAB — BASIC METABOLIC PANEL
BUN: 17 mg/dL (ref 6–23)
CO2: 30 mEq/L (ref 19–32)
Calcium: 9.8 mg/dL (ref 8.4–10.5)
Chloride: 101 mEq/L (ref 96–112)
Creatinine, Ser: 1.26 mg/dL (ref 0.40–1.50)
GFR: 58.53 mL/min — AB (ref >60.00)
GLUCOSE: 167 mg/dL — AB (ref 70–99)
Potassium: 3.9 mEq/L (ref 3.5–5.1)
Sodium: 139 mEq/L (ref 135–145)

## 2017-03-16 LAB — HEMOGLOBIN A1C: Hgb A1c MFr Bld: 6.9 % — ABNORMAL HIGH (ref 4.6–6.5)

## 2017-03-16 NOTE — Patient Instructions (Signed)
MC well w/Jill 

## 2017-03-16 NOTE — Assessment & Plan Note (Signed)
F/u w/Urology 

## 2017-03-16 NOTE — Assessment & Plan Note (Addendum)
Diovan HCT, Norvasc NAS diet Loose wt

## 2017-03-16 NOTE — Assessment & Plan Note (Signed)
A1c today.  

## 2017-03-16 NOTE — Assessment & Plan Note (Signed)
ASA, Amlodipine, Lovastatin

## 2017-03-16 NOTE — Progress Notes (Signed)
Subjective:  Patient ID: Jeff Burgess, male    DOB: Sep 24, 1935  Age: 82 y.o. MRN: [id number]  CC: No chief complaint on file.   HPI CHIA ROCK presents for HTN, DM, CAD f/u  Outpatient Medications Prior to Visit  Medication Sig Dispense Refill  . amLODipine (NORVASC) 10 MG tablet Take 0.5 tablets (5 mg total) by mouth daily. 90 tablet 3  . aspirin 81 MG tablet Take 2 tablets (162 mg total) by mouth daily. 100 tablet 3  . Cholecalciferol (VITAMIN D3) 1000 UNITS tablet Take 1,000 Units by mouth every morning.     . metFORMIN (GLUCOPHAGE-XR) 500 MG 24 hr tablet Take 1 tablet (500 mg total) by mouth 2 (two) times daily. 180 tablet 3  . pravastatin (PRAVACHOL) 40 MG tablet Take 1 tablet (40 mg total) by mouth daily. 90 tablet 3  . repaglinide (PRANDIN) 0.5 MG tablet Take 1 tablet (0.5 mg total) by mouth 3 (three) times daily before meals. 270 tablet 3  . valsartan-hydrochlorothiazide (DIOVAN HCT) 320-25 MG tablet Take 1 tablet by mouth daily. 90 tablet 3   No facility-administered medications prior to visit.     ROS Review of Systems  Constitutional: Positive for unexpected weight change. Negative for appetite change and fatigue.  HENT: Negative for congestion, nosebleeds, sneezing, sore throat and trouble swallowing.   Eyes: Negative for itching and visual disturbance.  Respiratory: Negative for cough.   Cardiovascular: Negative for chest pain, palpitations and leg swelling.  Gastrointestinal: Negative for abdominal distention, blood in stool, diarrhea and nausea.  Genitourinary: Negative for frequency and hematuria.  Musculoskeletal: Positive for arthralgias and back pain. Negative for gait problem, joint swelling and neck pain.  Skin: Negative for rash.  Neurological: Negative for dizziness, tremors, speech difficulty and weakness.  Psychiatric/Behavioral: Negative for agitation, dysphoric mood and sleep disturbance. The patient is not nervous/anxious.     Objective:    BP (!) 142/78 (BP Location: Left Arm, Patient Position: Sitting, Cuff Size: Large)   Pulse 82   Temp 97.8 F (36.6 C) (Oral)   Ht 5\' 9"  (1.753 m)   Wt 206 lb (93.4 kg)   SpO2 99%   BMI 30.42 kg/m   BP Readings from Last 3 Encounters:  03/16/17 (!) 142/78  12/16/16 (!) 142/76  09/16/16 (!) 146/82    Wt Readings from Last 3 Encounters:  03/16/17 206 lb (93.4 kg)  03/07/17 200 lb (90.7 kg)  12/16/16 202 lb (91.6 kg)    Physical Exam  Constitutional: He is oriented to person, place, and time. He appears well-developed. No distress.  NAD  HENT:  Mouth/Throat: Oropharynx is clear and moist.  Eyes: Conjunctivae are normal. Pupils are equal, round, and reactive to light.  Neck: Normal range of motion. No JVD present. No thyromegaly present.  Cardiovascular: Normal rate, regular rhythm, normal heart sounds and intact distal pulses. Exam reveals no gallop and no friction rub.  No murmur heard. Pulmonary/Chest: Effort normal and breath sounds normal. No respiratory distress. He has no wheezes. He has no rales. He exhibits no tenderness.  Abdominal: Soft. Bowel sounds are normal. He exhibits no distension and no mass. There is no tenderness. There is no rebound and no guarding.  Musculoskeletal: Normal range of motion. He exhibits tenderness. He exhibits no edema.  Lymphadenopathy:    He has no cervical adenopathy.  Neurological: He is alert and oriented to person, place, and time. He has normal reflexes. No cranial nerve deficit. He exhibits normal muscle tone.  He displays a negative Romberg sign. Coordination and gait normal.  Skin: Skin is warm and dry. No rash noted.  Psychiatric: He has a normal mood and affect. His behavior is normal. Judgment and thought content normal.    Lab Results  Component Value Date   WBC 10.2 10/08/2015   HGB 15.7 10/08/2015   HCT 45.3 10/08/2015   PLT 211 10/08/2015   GLUCOSE 157 (H) 12/16/2016   CHOL 122 10/03/2012   TRIG 166.0 (H) 10/03/2012    HDL 29.20 (L) 10/03/2012   LDLDIRECT 78.4 11/02/2011   LDLCALC 60 10/03/2012   ALT 16 08/11/2015   AST 14 08/11/2015   NA 141 12/16/2016   K 4.5 12/16/2016   CL 101 12/16/2016   CREATININE 1.33 12/16/2016   BUN 15 12/16/2016   CO2 31 12/16/2016   TSH 1.53 10/03/2012   PSA 1.48 10/03/2012   INR 1.02 10/08/2015   HGBA1C 6.8 (H) 12/16/2016    No results found.  Assessment & Plan:   There are no diagnoses linked to this encounter. I am having Duncan Dull. Majid maintain his cholecalciferol, aspirin, amLODipine, metFORMIN, pravastatin, repaglinide, and valsartan-hydrochlorothiazide.  No orders of the defined types were placed in this encounter.    Follow-up: No Follow-up on file.  Walker Kehr, MD

## 2017-03-16 NOTE — Assessment & Plan Note (Signed)
Lovastatin 

## 2017-03-16 NOTE — Assessment & Plan Note (Signed)
Stretch  

## 2017-03-24 DIAGNOSIS — L570 Actinic keratosis: Secondary | ICD-10-CM | POA: Diagnosis not present

## 2017-03-24 DIAGNOSIS — L821 Other seborrheic keratosis: Secondary | ICD-10-CM | POA: Diagnosis not present

## 2017-06-03 ENCOUNTER — Telehealth: Payer: Self-pay | Admitting: Internal Medicine

## 2017-06-03 MED ORDER — METFORMIN HCL ER 500 MG PO TB24: 500.0000 mg | ORAL_TABLET | Freq: Two times a day (BID) | ORAL | 1 refills | Status: DC

## 2017-06-03 NOTE — Telephone Encounter (Signed)
Reviewed chart pt is up-to-date sent refills to [facility].../[facility]  

## 2017-06-03 NOTE — Telephone Encounter (Signed)
Copied from [place] [phone]. Topic: Quick Communication - Rx Refill/Question >> Jun 03, 2017 11:42 AM Ahmed Prima L wrote: Medication: metFORMIN (GLUCOPHAGE-XR) 500 MG 24 hr tablet  Has the patient contacted their pharmacy? yes (Agent: If no, request that the patient contact the pharmacy for the refill.) (Agent: If yes, when and what did the pharmacy advise?) He called them on Wednesday and they said they faxed it  Preferred Pharmacy (with phone number or street name): [facility], [place]: Please be advised that RX refills may take up to 3 business days. We ask that you follow-up with your pharmacy.

## 2017-06-17 ENCOUNTER — Encounter: Payer: Self-pay | Admitting: Internal Medicine

## 2017-06-17 ENCOUNTER — Ambulatory Visit (INDEPENDENT_AMBULATORY_CARE_PROVIDER_SITE_OTHER): Payer: Medicare HMO | Admitting: Internal Medicine

## 2017-06-17 ENCOUNTER — Other Ambulatory Visit (INDEPENDENT_AMBULATORY_CARE_PROVIDER_SITE_OTHER): Payer: Medicare HMO

## 2017-06-17 DIAGNOSIS — I10 Essential (primary) hypertension: Secondary | ICD-10-CM

## 2017-06-17 DIAGNOSIS — E1122 Type 2 diabetes mellitus with diabetic chronic kidney disease: Secondary | ICD-10-CM | POA: Diagnosis not present

## 2017-06-17 DIAGNOSIS — I2510 Atherosclerotic heart disease of native coronary artery without angina pectoris: Secondary | ICD-10-CM | POA: Diagnosis not present

## 2017-06-17 DIAGNOSIS — R269 Unspecified abnormalities of gait and mobility: Secondary | ICD-10-CM | POA: Diagnosis not present

## 2017-06-17 DIAGNOSIS — M5440 Lumbago with sciatica, unspecified side: Secondary | ICD-10-CM | POA: Diagnosis not present

## 2017-06-17 DIAGNOSIS — M7061 Trochanteric bursitis, right hip: Secondary | ICD-10-CM | POA: Diagnosis not present

## 2017-06-17 DIAGNOSIS — E785 Hyperlipidemia, unspecified: Secondary | ICD-10-CM | POA: Diagnosis not present

## 2017-06-17 DIAGNOSIS — M7060 Trochanteric bursitis, unspecified hip: Secondary | ICD-10-CM | POA: Insufficient documentation

## 2017-06-17 LAB — BASIC METABOLIC PANEL
BUN: 22 mg/dL (ref 6–23)
CALCIUM: 9.6 mg/dL (ref 8.4–10.5)
CO2: 24 meq/L (ref 19–32)
Chloride: 102 mEq/L (ref 96–112)
Creatinine, Ser: 1.28 mg/dL (ref 0.40–1.50)
GFR: 57.44 mL/min — ABNORMAL LOW (ref >60.00)
Glucose, Bld: 166 mg/dL — ABNORMAL HIGH (ref 70–99)
Potassium: 4.1 mEq/L (ref 3.5–5.1)
SODIUM: 139 meq/L (ref 135–145)

## 2017-06-17 LAB — HEMOGLOBIN A1C: HEMOGLOBIN A1C: 7.2 % — AB (ref 4.6–6.5)

## 2017-06-17 NOTE — Assessment & Plan Note (Signed)
R hip

## 2017-06-17 NOTE — Assessment & Plan Note (Signed)
ASA, Amlodipine, Lovastatin

## 2017-06-17 NOTE — Assessment & Plan Note (Signed)
Lovastatin 

## 2017-06-17 NOTE — Progress Notes (Signed)
Subjective:  Patient ID: Jeff Burgess, male    DOB: 01-Sep-1935  Age: 82 y.o. MRN: [id number]  CC: No chief complaint on file.   HPI Jeff Burgess presents for CAD, HTN, DM C/o R hip bursitis   Outpatient Medications Prior to Visit  Medication Sig Dispense Refill  . amLODipine (NORVASC) 10 MG tablet Take 0.5 tablets (5 mg total) by mouth daily. 90 tablet 3  . aspirin 81 MG tablet Take 2 tablets (162 mg total) by mouth daily. 100 tablet 3  . Cholecalciferol (VITAMIN D3) 1000 UNITS tablet Take 1,000 Units by mouth every morning.     . metFORMIN (GLUCOPHAGE-XR) 500 MG 24 hr tablet Take 1 tablet (500 mg total) by mouth 2 (two) times daily. 180 tablet 1  . pravastatin (PRAVACHOL) 40 MG tablet Take 1 tablet (40 mg total) by mouth daily. 90 tablet 3  . repaglinide (PRANDIN) 0.5 MG tablet Take 1 tablet (0.5 mg total) by mouth 3 (three) times daily before meals. 270 tablet 3  . valsartan-hydrochlorothiazide (DIOVAN HCT) 320-25 MG tablet Take 1 tablet by mouth daily. 90 tablet 3   No facility-administered medications prior to visit.     ROS: Review of Systems  Constitutional: Negative for appetite change, fatigue and unexpected weight change.  HENT: Negative for congestion, nosebleeds, sneezing, sore throat and trouble swallowing.   Eyes: Negative for itching and visual disturbance.  Respiratory: Negative for cough.   Cardiovascular: Negative for chest pain, palpitations and leg swelling.  Gastrointestinal: Negative for abdominal distention, blood in stool, diarrhea and nausea.  Genitourinary: Negative for frequency and hematuria.  Musculoskeletal: Positive for arthralgias, back pain and gait problem. Negative for joint swelling and neck pain.  Skin: Negative for rash.  Neurological: Negative for dizziness, tremors, speech difficulty and weakness.  Psychiatric/Behavioral: Negative for agitation, dysphoric mood, sleep disturbance and suicidal ideas. The patient is not nervous/anxious.      Objective:  BP 136/74 (BP Location: Right Arm, Patient Position: Sitting, Cuff Size: Large)   Pulse 75   Temp 98.1 F (36.7 C) (Oral)   Ht 5\' 9"  (1.753 m)   Wt 201 lb (91.2 kg)   SpO2 98%   BMI 29.68 kg/m   BP Readings from Last 3 Encounters:  06/17/17 136/74  03/16/17 (!) 142/78  12/16/16 (!) 142/76    Wt Readings from Last 3 Encounters:  06/17/17 201 lb (91.2 kg)  03/16/17 206 lb (93.4 kg)  03/07/17 200 lb (90.7 kg)    Physical Exam  Constitutional: He is oriented to person, place, and time. He appears well-developed. No distress.  NAD  HENT:  Mouth/Throat: Oropharynx is clear and moist.  Eyes: Pupils are equal, round, and reactive to light. Conjunctivae are normal.  Neck: Normal range of motion. No JVD present. No thyromegaly present.  Cardiovascular: Normal rate, regular rhythm, normal heart sounds and intact distal pulses. Exam reveals no gallop and no friction rub.  No murmur heard. Pulmonary/Chest: Effort normal and breath sounds normal. No respiratory distress. He has no wheezes. He has no rales. He exhibits no tenderness.  Abdominal: Soft. Bowel sounds are normal. He exhibits no distension and no mass. There is no tenderness. There is no rebound and no guarding.  Musculoskeletal: Normal range of motion. He exhibits tenderness. He exhibits no edema.  Lymphadenopathy:    He has no cervical adenopathy.  Neurological: He is alert and oriented to person, place, and time. He has normal reflexes. No cranial nerve deficit. He exhibits normal muscle  tone. He displays a negative Romberg sign. Coordination and gait normal.  Skin: Skin is warm and dry. No rash noted.  Psychiatric: He has a normal mood and affect. His behavior is normal. Judgment and thought content normal.  R lat hip is tender L elbow is scraped  Lab Results  Component Value Date   WBC 10.2 10/08/2015   HGB 15.7 10/08/2015   HCT 45.3 10/08/2015   PLT 211 10/08/2015   GLUCOSE 167 (H) 03/16/2017    CHOL 122 10/03/2012   TRIG 166.0 (H) 10/03/2012   HDL 29.20 (L) 10/03/2012   LDLDIRECT 78.4 11/02/2011   LDLCALC 60 10/03/2012   ALT 16 08/11/2015   AST 14 08/11/2015   NA 139 03/16/2017   K 3.9 03/16/2017   CL 101 03/16/2017   CREATININE 1.26 03/16/2017   BUN 17 03/16/2017   CO2 30 03/16/2017   TSH 1.53 10/03/2012   PSA 1.48 10/03/2012   INR 1.02 10/08/2015   HGBA1C 6.9 (H) 03/16/2017    No results found.  Assessment & Plan:   There are no diagnoses linked to this encounter.   No orders of the defined types were placed in this encounter.    Follow-up: No follow-ups on file.  Walker Kehr, MD

## 2017-06-17 NOTE — Assessment & Plan Note (Signed)
Better  

## 2017-06-17 NOTE — Patient Instructions (Addendum)
Wt Readings from Last 3 Encounters:  06/17/17 201 lb (91.2 kg)  03/16/17 206 lb (93.4 kg)  03/07/17 200 lb (90.7 kg)    Hip Bursitis Hip bursitis is swelling of a fluid-filled sac (bursa) in your hip. This swelling (inflammation) can be painful. This condition may come and go over time. Follow these instructions at home: Medicines  Take over-the-counter and prescription medicines only as told by your doctor.  Do not drive or use heavy machinery while taking prescription pain medicine, or as told by your doctor.  If you were prescribed an antibiotic medicine, take it as told by your doctor. Do not stop taking the antibiotic even if you start to feel better. Activity  Return to your normal activities as told by your doctor. Ask your doctor what activities are safe for you.  Rest and protect your hip until you feel better. General instructions  Wear wraps that put pressure on your hip (compression wraps) only as told by your doctor.  Raise (elevate) your hip above the level of your heart as much as you can. To do this, try putting a pillow under your hips while you lie down. Stop if this causes pain.  Do not use your hip to support your body weight until your doctor says that you can.  Use crutches as told by your doctor.  Gently rub and stretch your injured area as often as is comfortable.  Keep all follow-up visits as told by your doctor. This is important. How is this prevented?  Exercise regularly, as told by your doctor.  Warm up and stretch before being active.  Cool down and stretch after being active.  Avoid activities that bother your hip or cause pain.  Avoid sitting down for long periods at a time. Contact a doctor if:  You have a fever.  You get new symptoms.  You have trouble walking.  You have trouble doing everyday activities.  You have pain that gets worse.  You have pain that does not get better with medicine.  You get red skin on your hip  area.  You get a feeling of warmth in your hip area. Get help right away if:  You cannot move your hip.  You have very bad pain. This information is not intended to replace advice given to you by your health care provider. Make sure you discuss any questions you have with your health care provider. Document Released: 01/30/2010 Document Revised: 06/05/2015 Document Reviewed: 07/30/2014 Elsevier Interactive Patient Education  [employer].

## 2017-06-17 NOTE — Assessment & Plan Note (Signed)
BP Readings from Last 3 Encounters:  06/17/17 136/74  03/16/17 (!) 142/78  12/16/16 (!) 142/76

## 2017-09-16 NOTE — Progress Notes (Addendum)
Subjective:   Jeff Burgess is a 82 y.o. male who presents for Medicare Annual/Subsequent preventive examination.  Review of Systems:  No ROS.  Medicare Wellness Visit. Additional risk factors are reflected in the social history.    Sleep patterns: feels rested on waking, gets up 1-2 times nightly to void and sleeps 7-8 hours nightly.    Home Safety/Smoke Alarms: Feels safe in home. Smoke alarms in place.  Living environment; residence and Firearm Safety: 1-story house/ trailer, no firearms. Lives with wife, no needs for DME, good support system Seat Belt Safety/Bike Helmet: Wears seat belt.     Objective:    Vitals: There were no vitals taken for this visit.  There is no height or weight on file to calculate BMI.  Advanced Directives 09/16/2016 10/08/2015 06/02/2015  Does Patient Have a Medical Advance Directive? No No Yes  Type of Advance Directive - - Living will  Does patient want to make changes to medical advance directive? Yes (ED - Information included in AVS) No - Patient declined -  Would patient like information on creating a medical advance directive? - No - patient declined information -    Tobacco Social History   Tobacco Use  Smoking Status Former Smoker  . Years: 15.00  . Types: Cigarettes  . Last attempt to quit: 01/18/1971  . Years since quitting: 46.6  Smokeless Tobacco Current User  . Types: Snuff     Ready to quit: Not Answered Counseling given: Not Answered  Past Medical History:  Diagnosis Date  . Benign localized prostatic hyperplasia with lower urinary tract symptoms (LUTS)   . Bladder cancer [facility]) dx 05/ 2017--  urologist- dr Pilar Jarvis   TCC , Ta of bladder  s/p TURBT 06-02-2015  . CAD (coronary artery disease)    no cardiologist--  followed by pcp,  dr plontnikov  . Chronic low back pain with sciatica   . CKD (chronic kidney disease), stage III ([place])   . Diabetes mellitus, type 2 ([place])   . ED (erectile dysfunction)   . Full dentures   .  History of ETT    08-09-2014   fair exercise tolerance, no chest pain, normal BP response, no ST changes (baseline RBBB);  negative adequate ETT  . Hyperlipidemia   . Hypertension   . Hypogonadism male   . OA (osteoarthritis)   . Right bundle branch block (RBBB)   . S/P CABG x 1    1998  . Sigmoid diverticulosis   . Ventral hernia    midline  . Wears glasses    Past Surgical History:  Procedure Laterality Date  . COLONOSCOPY WITH PROPOFOL  02-01-2012  . CORONARY ARTERY BYPASS GRAFT  1998  at [facility]   x1  vessel   . CYSTOSCOPY WITH BIOPSY N/A 10/08/2015   Procedure: CYSTOSCOPY WITH BLADDER BIOPSY;  Surgeon: Nickie Retort, MD;  Location: [facility];  Service: Urology;  Laterality: N/A;  . CYSTOSCOPY WITH FULGERATION N/A 10/08/2015   Procedure: CYSTOSCOPY WITH FULGERATION;  Surgeon: Nickie Retort, MD;  Location: [facility];  Service: Urology;  Laterality: N/A;  . TONSILLECTOMY  age 45  . TRANSURETHRAL RESECTION OF BLADDER TUMOR WITH GYRUS (TURBT-GYRUS) N/A 06/02/2015   Procedure: TRANSURETHRAL RESECTION OF BLADDER TUMOR WITH GYRUS (TURBT-GYRUS);  Surgeon: Nickie Retort, MD;  Location: [facility];  Service: Urology;  Laterality: N/A;   Family History  Problem Relation Age of Onset  . Arthritis Mother 49  RA  . Alcohol abuse Father 38       exposure  . Diabetes Brother   . Kidney disease Brother   . Heart disease Brother   . Hypertension Other   . Colon cancer Neg Hx   . Esophageal cancer Neg Hx   . Rectal cancer Neg Hx   . Stomach cancer Neg Hx    Social History   Socioeconomic History  . Marital status: Married    Spouse name: Not on file  . Number of children: Not on file  . Years of education: Not on file  . Highest education level: Not on file  Occupational History  . Occupation: retired    Comment: but working Forensic scientist  . Financial resource strain: Not on file  . Food  insecurity:    Worry: Not on file    Inability: Not on file  . Transportation needs:    Medical: Not on file    Non-medical: Not on file  Tobacco Use  . Smoking status: Former Smoker    Years: 15.00    Types: Cigarettes    Last attempt to quit: 01/18/1971    Years since quitting: 46.6  . Smokeless tobacco: Current User    Types: Snuff  Substance and Sexual Activity  . Alcohol use: No  . Drug use: No  . Sexual activity: Not on file  Lifestyle  . Physical activity:    Days per week: Not on file    Minutes per session: Not on file  . Stress: Not on file  Relationships  . Social connections:    Talks on phone: Not on file    Gets together: Not on file    Attends religious service: Not on file    Active member of club or organization: Not on file    Attends meetings of clubs or organizations: Not on file    Relationship status: Not on file  Other Topics Concern  . Not on file  Social History Narrative   Regular exercise-yes    Outpatient Encounter Medications as of 09/19/2017  Medication Sig  . amLODipine (NORVASC) 10 MG tablet Take 0.5 tablets (5 mg total) by mouth daily.  [name] aspirin 81 MG tablet Take 2 tablets (162 mg total) by mouth daily.  . Cholecalciferol (VITAMIN D3) 1000 UNITS tablet Take 1,000 Units by mouth every morning.   . metFORMIN (GLUCOPHAGE-XR) 500 MG 24 hr tablet Take 1 tablet (500 mg total) by mouth 2 (two) times daily.  . pravastatin (PRAVACHOL) 40 MG tablet Take 1 tablet (40 mg total) by mouth daily.  . repaglinide (PRANDIN) 0.5 MG tablet Take 1 tablet (0.5 mg total) by mouth 3 (three) times daily before meals.  . valsartan-hydrochlorothiazide (DIOVAN HCT) 320-25 MG tablet Take 1 tablet by mouth daily.   No facility-administered encounter medications on file as of 09/19/2017.     Activities of Daily Living In your present state of health, do you have any difficulty performing the following activities: 09/16/2016  Hearing? N  Vision? N  Difficulty  concentrating or making decisions? N  Walking or climbing stairs? N  Dressing or bathing? N  Doing errands, shopping? N  Preparing Food and eating ? N  Using the Toilet? N  In the past six months, have you accidently leaked urine? N  Do you have problems with loss of bowel control? N  Managing your Medications? N  Managing your Finances? N  Housekeeping or managing your Housekeeping? N  Some recent  data might be hidden    Patient Care Team: Plotnikov, Evie Lacks, MD as PCP - General   Assessment:   This is a routine wellness examination for [place]. Physical assessment deferred to PCP.   Exercise Activities and Dietary recommendations   Diet (meal preparation, eat out, water intake, caffeinated beverages, dairy products, fruits and vegetables): in general, a "healthy" diet  , well balanced, states he is fasting x2 daily weekly, supplements with Boost for diabetics.   Reviewed heart healthy and diabetic diet. Discussed not fasting but to eat small amounts frequently. Encouraged patient to increase daily water and healthy fluid intake. Relevant patient education assigned to patient using Emmi.  Goals    . Increase my daily physical activity     Start in the morning walking Radar and going to the gym several times per week.       Fall Risk Fall Risk  09/16/2016 08/13/2015 05/27/2014  Falls in the past year? No No Yes  Number falls in past yr: - - 1  Injury with Fall? - - No   Depression Screen PHQ 2/9 Scores 09/16/2016 08/13/2015 05/27/2014  PHQ - 2 Score 0 0 0  PHQ- 9 Score 0 - -    Cognitive Function MMSE - Mini Mental State Exam 09/16/2016  Orientation to time 5  Orientation to Place 5  Registration 3  Attention/ Calculation 5  Recall 2  Language- name 2 objects 2  Language- repeat 1  Language- follow 3 step command 3  Language- read & follow direction 1  Write a sentence 1  Copy design 1  Total score 29       Ad8 score reviewed for issues:  Issues making  decisions: no  Less interest in hobbies / activities: no  Repeats questions, stories (family complaining): no  Trouble using ordinary gadgets (microwave, computer, phone):no  Forgets the month or year: no  Mismanaging finances: no  Remembering appts: no  Daily problems with thinking and/or memory: no Ad8 score is= 0  Immunization History  Administered Date(s) Administered  . Influenza Split 11/11/2011  . Influenza Whole 11/05/2005  . Influenza, High Dose Seasonal PF 12/08/2015, 09/16/2016  . Influenza,inj,Quad PF,6+ Mos 10/05/2012, 09/30/2014  . Pneumococcal Conjugate-13 04/04/2013  . Pneumococcal Polysaccharide-23 07/02/2005, 12/16/2016  . Td 11/11/2011   Screening Tests Health Maintenance  Topic Date Due  . OPHTHALMOLOGY EXAM  03/18/1945  . FOOT EXAM  01/19/2015  . INFLUENZA VACCINE  08/11/2017  . TETANUS/TDAP  11/10/2021  . PNA vac Low Risk Adult  Completed      Plan:    Continue doing brain stimulating activities (puzzles, reading, adult coloring books, staying active) to keep memory sharp.   Continue to eat heart healthy diet (full of fruits, vegetables, whole grains, lean protein, water--limit salt, fat, and sugar intake) and increase physical activity as tolerated.  I have personally reviewed and noted the following in the patient's chart:   . Medical and social history . Use of alcohol, tobacco or illicit drugs  . Current medications and supplements . Functional ability and status . Nutritional status . Physical activity . Advanced directives . List of other physicians . Vitals . Screenings to include cognitive, depression, and falls . Referrals and appointments  In addition, I have reviewed and discussed with patient certain preventive protocols, quality metrics, and best practice recommendations. A written personalized care plan for preventive services as well as general preventive health recommendations were provided to patient.     Argie Ramming Kingdavid Leinbach,  RN  09/16/2017  Medical screening examination/treatment/procedure(s) were performed by non-physician practitioner and as supervising physician I was immediately available for consultation/collaboration. I agree with above. Lew Dawes, MD

## 2017-09-19 ENCOUNTER — Other Ambulatory Visit (INDEPENDENT_AMBULATORY_CARE_PROVIDER_SITE_OTHER): Payer: Medicare HMO

## 2017-09-19 ENCOUNTER — Encounter: Payer: Self-pay | Admitting: Internal Medicine

## 2017-09-19 ENCOUNTER — Telehealth: Payer: Self-pay

## 2017-09-19 ENCOUNTER — Ambulatory Visit (INDEPENDENT_AMBULATORY_CARE_PROVIDER_SITE_OTHER): Payer: Medicare HMO | Admitting: *Deleted

## 2017-09-19 ENCOUNTER — Ambulatory Visit (INDEPENDENT_AMBULATORY_CARE_PROVIDER_SITE_OTHER): Payer: Medicare HMO | Admitting: Internal Medicine

## 2017-09-19 VITALS — BP 126/62 | HR 69 | Temp 98.2°F | Ht 69.0 in | Wt 196.0 lb

## 2017-09-19 VITALS — BP 126/62 | HR 69 | Temp 98.2°F | Resp 18 | Ht 69.0 in | Wt 196.0 lb

## 2017-09-19 DIAGNOSIS — N183 Chronic kidney disease, stage 3 (moderate): Secondary | ICD-10-CM

## 2017-09-19 DIAGNOSIS — R269 Unspecified abnormalities of gait and mobility: Secondary | ICD-10-CM

## 2017-09-19 DIAGNOSIS — M79604 Pain in right leg: Secondary | ICD-10-CM

## 2017-09-19 DIAGNOSIS — R5383 Other fatigue: Secondary | ICD-10-CM

## 2017-09-19 DIAGNOSIS — Z0000 Encounter for general adult medical examination without abnormal findings: Secondary | ICD-10-CM

## 2017-09-19 DIAGNOSIS — M5440 Lumbago with sciatica, unspecified side: Secondary | ICD-10-CM | POA: Diagnosis not present

## 2017-09-19 DIAGNOSIS — I2510 Atherosclerotic heart disease of native coronary artery without angina pectoris: Secondary | ICD-10-CM

## 2017-09-19 DIAGNOSIS — I10 Essential (primary) hypertension: Secondary | ICD-10-CM

## 2017-09-19 DIAGNOSIS — M79605 Pain in left leg: Secondary | ICD-10-CM

## 2017-09-19 DIAGNOSIS — E1122 Type 2 diabetes mellitus with diabetic chronic kidney disease: Secondary | ICD-10-CM

## 2017-09-19 DIAGNOSIS — Z23 Encounter for immunization: Secondary | ICD-10-CM | POA: Diagnosis not present

## 2017-09-19 DIAGNOSIS — E785 Hyperlipidemia, unspecified: Secondary | ICD-10-CM

## 2017-09-19 LAB — BASIC METABOLIC PANEL
BUN: 18 mg/dL (ref 6–23)
CHLORIDE: 102 meq/L (ref 96–112)
CO2: 29 mEq/L (ref 19–32)
CREATININE: 1.3 mg/dL (ref 0.40–1.50)
Calcium: 9.8 mg/dL (ref 8.4–10.5)
GFR: 56.38 mL/min — ABNORMAL LOW (ref >60.00)
GLUCOSE: 158 mg/dL — AB (ref 70–99)
POTASSIUM: 4.4 meq/L (ref 3.5–5.1)
Sodium: 141 mEq/L (ref 135–145)

## 2017-09-19 LAB — HEMOGLOBIN A1C: Hgb A1c MFr Bld: 6.7 % — ABNORMAL HIGH (ref 4.6–6.5)

## 2017-09-19 MED ORDER — PITAVASTATIN CALCIUM 2 MG PO TABS: 2.0000 mg | ORAL_TABLET | Freq: Every day | ORAL | 11 refills | Status: DC

## 2017-09-19 NOTE — Assessment & Plan Note (Signed)
Here for medicare wellness/physical  Diet: heart healthy  Physical activity: not sedentary  Depression/mood screen: negative  Hearing: intact to whispered voice  Visual acuity: grossly normal, performs annual eye exam  ADLs: capable  Fall risk: low to moderate Home safety: good  Cognitive evaluation: intact to orientation, naming, recall and repetition  EOL planning: adv directives, full code/ I agree  I have personally reviewed and have noted  1. The patient's medical, surgical and social history  2. Their use of alcohol, tobacco or illicit drugs  3. Their current medications and supplements  4. The patient's functional ability including ADL's, fall risks, home safety risks and hearing or visual impairment.  5. Diet and physical activities  6. Evidence for depression or mood disorders 7. The roster of all physicians providing medical care to patient - is listed in the Snapshot section of the chart and reviewed today.    Today patient counseled on age appropriate routine health concerns for screening and prevention, each reviewed and up to date or declined. Immunizations reviewed and up to date or declined. Labs ordered and reviewed. Risk factors for depression reviewed and negative. Hearing function and visual acuity are intact. ADLs screened and addressed as needed. Functional ability and level of safety reviewed and appropriate. Education, counseling and referrals performed based on assessed risks today. Patient provided with a copy of personalized plan for preventive services.

## 2017-09-19 NOTE — Addendum Note (Signed)
Addended by: Karren Cobble on: 09/19/2017 10:10 AM   Modules accepted: Orders

## 2017-09-19 NOTE — Assessment & Plan Note (Signed)
Hold Pravastatin x 2 weeks Livalo Rx

## 2017-09-19 NOTE — Assessment & Plan Note (Signed)
Labs

## 2017-09-19 NOTE — Patient Instructions (Addendum)
Continue doing brain stimulating activities (puzzles, reading, adult coloring books, staying active) to keep memory sharp.   Continue to eat heart healthy diet (full of fruits, vegetables, whole grains, lean protein, water--limit salt, fat, and sugar intake) and increase physical activity as tolerated.   Jeff Burgess , Thank you for taking time to come for your Medicare Wellness Visit. I appreciate your ongoing commitment to your health goals. Please review the following plan we discussed and let me know if I can assist you in the future.   These are the goals we discussed: Goals    . Increase my daily physical activity     Start in the morning walking Radar and going to the gym several times per week.    . Patient Stated     Stay as healthy and as independent as possible.       This is a list of the screening recommended for you and due dates:  Health Maintenance  Topic Date Due  . Eye exam for diabetics  03/18/1945  . Complete foot exam   01/19/2015  . Flu Shot  08/11/2017  . Tetanus Vaccine  11/10/2021  . Pneumonia vaccines  Completed

## 2017-09-19 NOTE — Telephone Encounter (Signed)
order entered 

## 2017-09-19 NOTE — Progress Notes (Signed)
Subjective:  Patient ID: Jeff Burgess, male    DOB: [birth year]  Age: 82 y.o. MRN: [id number]  CC: No chief complaint on file.   HPI [name] presents for fatigue, HTN, CAD, DM C/o poor balance - legs are weak and "tight" x chronic.  Well exam  Outpatient Medications Prior to Visit  Medication Sig Dispense Refill  . amLODipine (NORVASC) 10 MG tablet Take 0.5 tablets (5 mg total) by mouth daily. 90 tablet 3  . aspirin 81 MG tablet Take 2 tablets (162 mg total) by mouth daily. 100 tablet 3  . Cholecalciferol (VITAMIN D3) 1000 UNITS tablet Take 1,000 Units by mouth every morning.     . metFORMIN (GLUCOPHAGE-XR) 500 MG 24 hr tablet Take 1 tablet (500 mg total) by mouth 2 (two) times daily. 180 tablet 1  . pravastatin (PRAVACHOL) 40 MG tablet Take 1 tablet (40 mg total) by mouth daily. 90 tablet 3  . repaglinide (PRANDIN) 0.5 MG tablet Take 1 tablet (0.5 mg total) by mouth 3 (three) times daily before meals. 270 tablet 3  . valsartan-hydrochlorothiazide (DIOVAN HCT) 320-25 MG tablet Take 1 tablet by mouth daily. 90 tablet 3   No facility-administered medications prior to visit.     ROS: Review of Systems  Constitutional: Positive for fatigue. Negative for appetite change and unexpected weight change.  HENT: Negative for congestion, nosebleeds, sneezing, sore throat and trouble swallowing.   Eyes: Negative for itching and visual disturbance.  Respiratory: Negative for cough.   Cardiovascular: Negative for chest pain, palpitations and leg swelling.  Gastrointestinal: Negative for abdominal distention, blood in stool, diarrhea and nausea.  Genitourinary: Negative for frequency and hematuria.  Musculoskeletal: Positive for arthralgias and gait problem. Negative for back pain, joint swelling and neck pain.  Skin: Negative for rash.  Neurological: Negative for dizziness, tremors, speech difficulty and weakness.  Psychiatric/Behavioral: Negative for agitation, dysphoric mood and  sleep disturbance. The patient is not nervous/anxious.     Objective:  BP 126/62   Pulse 69   Temp 98.2 F (36.8 C) (Oral)   Ht 5\' 9"  (1.753 m)   Wt 196 lb (88.9 kg)   SpO2 98%   BMI 28.94 kg/m   BP Readings from Last 3 Encounters:  09/19/17 126/62  09/19/17 126/62  06/17/17 136/74    Wt Readings from Last 3 Encounters:  09/19/17 196 lb (88.9 kg)  09/19/17 196 lb (88.9 kg)  06/17/17 201 lb (91.2 kg)    Physical Exam  Constitutional: He is oriented to person, place, and time. He appears well-developed. No distress.  NAD  HENT:  Mouth/Throat: Oropharynx is clear and moist.  Eyes: Pupils are equal, round, and reactive to light. Conjunctivae are normal.  Neck: Normal range of motion. No JVD present. No thyromegaly present.  Cardiovascular: Normal rate, regular rhythm, normal heart sounds and intact distal pulses. Exam reveals no gallop and no friction rub.  No murmur heard. Pulmonary/Chest: Effort normal and breath sounds normal. No respiratory distress. He has no wheezes. He has no rales. He exhibits no tenderness.  Abdominal: Soft. Bowel sounds are normal. He exhibits no distension and no mass. There is no tenderness. There is no rebound and no guarding.  Musculoskeletal: Normal range of motion. He exhibits no edema or tenderness.  Lymphadenopathy:    He has no cervical adenopathy.  Neurological: He is alert and oriented to person, place, and time. He has normal reflexes. No cranial nerve deficit. He exhibits normal muscle tone. He displays  a negative Romberg sign. Coordination and gait normal.  Skin: Skin is warm and dry. No rash noted.  Psychiatric: He has a normal mood and affect. His behavior is normal. Judgment and thought content normal.  hips stiff LE 5-/5 in the prox hips  Lab Results  Component Value Date   WBC 10.2 10/08/2015   HGB 15.7 10/08/2015   HCT 45.3 10/08/2015   PLT 211 10/08/2015   GLUCOSE 166 (H) 06/17/2017   CHOL 122 10/03/2012   TRIG 166.0  (H) 10/03/2012   HDL 29.20 (L) 10/03/2012   LDLDIRECT 78.4 11/02/2011   LDLCALC 60 10/03/2012   ALT 16 08/11/2015   AST 14 08/11/2015   NA 139 06/17/2017   K 4.1 06/17/2017   CL 102 06/17/2017   CREATININE 1.28 06/17/2017   BUN 22 06/17/2017   CO2 24 06/17/2017   TSH 1.53 10/03/2012   PSA 1.48 10/03/2012   INR 1.02 10/08/2015   HGBA1C 7.2 (H) 06/17/2017    No results found.  Assessment & Plan:   There are no diagnoses linked to this encounter.   No orders of the defined types were placed in this encounter.    Follow-up: No follow-ups on file.  Walker Kehr, MD

## 2017-09-19 NOTE — Assessment & Plan Note (Signed)
Better  

## 2017-09-19 NOTE — Assessment & Plan Note (Addendum)
Hold Pravastatin x 2 weeks Livalo Rx

## 2017-09-19 NOTE — Telephone Encounter (Signed)
-----   Message from Rufina Falco sent at 09/19/2017 11:31 AM EDT ----- Regarding: Additional order needed Good Morning,   Will you please enter an ABI for Jeff Burgess?  Thanks, [place]

## 2017-09-19 NOTE — Patient Instructions (Signed)
Hold Pravastatin x 2 weeks 

## 2017-09-19 NOTE — Assessment & Plan Note (Addendum)
No angina Brother MI on dialysis - died 04/14/2014 stress test ok ASA, Amlodipine, Lovastatin

## 2017-09-28 ENCOUNTER — Ambulatory Visit (INDEPENDENT_AMBULATORY_CARE_PROVIDER_SITE_OTHER)
Admission: RE | Admit: 2017-09-28 | Discharge: 2017-09-28 | Disposition: A | Discharge status: Home / Self Care | Payer: Medicare HMO | Source: Ambulatory Visit | Attending: Vascular Surgery | Admitting: Vascular Surgery

## 2017-09-28 ENCOUNTER — Ambulatory Visit (HOSPITAL_COMMUNITY)
Admission: RE | Admit: 2017-09-28 | Discharge: 2017-09-28 | Disposition: A | Discharge status: Home / Self Care | Payer: Medicare HMO | Source: Ambulatory Visit | Attending: Vascular Surgery | Admitting: Vascular Surgery

## 2017-09-28 DIAGNOSIS — M79605 Pain in left leg: Secondary | ICD-10-CM | POA: Diagnosis not present

## 2017-09-28 DIAGNOSIS — M79604 Pain in right leg: Secondary | ICD-10-CM

## 2017-09-28 DIAGNOSIS — I70203 Unspecified atherosclerosis of native arteries of extremities, bilateral legs: Secondary | ICD-10-CM | POA: Diagnosis not present

## 2017-09-28 DIAGNOSIS — E785 Hyperlipidemia, unspecified: Secondary | ICD-10-CM | POA: Diagnosis not present

## 2017-09-28 DIAGNOSIS — I10 Essential (primary) hypertension: Secondary | ICD-10-CM | POA: Insufficient documentation

## 2017-09-28 DIAGNOSIS — Z87891 Personal history of nicotine dependence: Secondary | ICD-10-CM | POA: Insufficient documentation

## 2017-09-28 DIAGNOSIS — R269 Unspecified abnormalities of gait and mobility: Secondary | ICD-10-CM

## 2017-10-04 ENCOUNTER — Telehealth: Payer: Self-pay | Admitting: Internal Medicine

## 2017-10-04 DIAGNOSIS — I739 Peripheral vascular disease, unspecified: Secondary | ICD-10-CM

## 2017-10-04 NOTE — Telephone Encounter (Signed)
Referral entered, pts wife notified

## 2017-10-04 NOTE — Telephone Encounter (Signed)
There was no referral placed for this. Can you please put referral in and let pt know. I will keep an eye out for it.   Copied from [place] [id number]. Topic: Referral - Question >> Oct 04, 2017  9:26 AM Bea Graff, NT wrote: Reason for CRM: Pts wife states they received a call her husband will be referred to a surgeon for PAD and she is checking status on this. Please advise.

## 2017-11-24 ENCOUNTER — Encounter: Payer: Self-pay | Admitting: Vascular Surgery

## 2017-11-24 ENCOUNTER — Other Ambulatory Visit: Payer: Self-pay

## 2017-11-24 ENCOUNTER — Ambulatory Visit: Payer: Medicare HMO | Admitting: Vascular Surgery

## 2017-11-24 ENCOUNTER — Encounter

## 2017-11-24 VITALS — BP 146/84 | HR 82 | Temp 97.3°F | Resp 18 | Ht 67.5 in | Wt 199.0 lb

## 2017-11-24 DIAGNOSIS — I739 Peripheral vascular disease, unspecified: Secondary | ICD-10-CM

## 2017-11-24 NOTE — Progress Notes (Signed)
Referring Physician: Dr Alain Marion  Patient name: Jeff Burgess MRN: [id number] DOB: 1935-03-11 Sex: male  REASON FOR CONSULT: Bilateral leg weakness  HPI: Jeff Burgess is a 82 y.o. male, with a several year history of back pain and leg weakness.  This has gotten worse over the last year.  The patient states that the worst problem is when he goes from sitting in a chair to standing.  He says it takes several steps before his legs start to work normally.  He also has difficulty with balance especially when climbing stairs.  He has some numbness and tingling in the forefoot bilaterally.  He does not really describe fatigue or pain in the calf when walking consistent with claudication.  He does not describe rest pain.  He has no history of nonhealing wounds.  He is a former smoker but quit in 1977.  States that his legs actually get better when walking.  He states also that Dr. Alain Marion suggested to him that it may be his back that is causing his leg problems.  Other medical problems include diabetes lipidemia hypertension all of which are currently controlled.  He is on aspirin and a statin.  Past Medical History:  Diagnosis Date  . Benign localized prostatic hyperplasia with lower urinary tract symptoms (LUTS)   . Bladder cancer [facility]) dx 05/ 2017--  urologist- dr Pilar Jarvis   TCC , Ta of bladder  s/p TURBT 06-02-2015  . CAD (coronary artery disease)    no cardiologist--  followed by pcp,  dr plontnikov  . Chronic low back pain with sciatica   . CKD (chronic kidney disease), stage III ([place])   . Diabetes mellitus, type 2 ([place])   . ED (erectile dysfunction)   . Full dentures   . History of ETT    08-09-2014   fair exercise tolerance, no chest pain, normal BP response, no ST changes (baseline RBBB);  negative adequate ETT  . Hyperlipidemia   . Hypertension   . Hypogonadism male   . OA (osteoarthritis)   . Right bundle branch block (RBBB)   . S/P CABG x 1    1998  . Sigmoid  diverticulosis   . Ventral hernia    midline  . Wears glasses    Past Surgical History:  Procedure Laterality Date  . COLONOSCOPY WITH PROPOFOL  02-01-2012  . CORONARY ARTERY BYPASS GRAFT  1998  at [facility]   x1  vessel   . CYSTOSCOPY WITH BIOPSY N/A 10/08/2015   Procedure: CYSTOSCOPY WITH BLADDER BIOPSY;  Surgeon: Nickie Retort, MD;  Location: [facility];  Service: Urology;  Laterality: N/A;  . CYSTOSCOPY WITH FULGERATION N/A 10/08/2015   Procedure: CYSTOSCOPY WITH FULGERATION;  Surgeon: Nickie Retort, MD;  Location: [facility];  Service: Urology;  Laterality: N/A;  . TONSILLECTOMY  age 19  . TRANSURETHRAL RESECTION OF BLADDER TUMOR WITH GYRUS (TURBT-GYRUS) N/A 06/02/2015   Procedure: TRANSURETHRAL RESECTION OF BLADDER TUMOR WITH GYRUS (TURBT-GYRUS);  Surgeon: Nickie Retort, MD;  Location: [facility];  Service: Urology;  Laterality: N/A;    Family History  Problem Relation Age of Onset  . Arthritis Mother 58       RA  . Alcohol abuse Father 70       exposure  . Diabetes Brother   . Kidney disease Brother   . Heart disease Brother   . Hypertension Other   . Colon cancer Neg Hx   .  Esophageal cancer Neg Hx   . Rectal cancer Neg Hx   . Stomach cancer Neg Hx     SOCIAL HISTORY: Social History   Socioeconomic History  . Marital status: Married    Spouse name: Not on file  . Number of children: Not on file  . Years of education: Not on file  . Highest education level: Not on file  Occupational History  . Occupation: retired    Comment: but working Forensic scientist  . Financial resource strain: Not hard at all  . Food insecurity:    Worry: Never true    Inability: Never true  . Transportation needs:    Medical: No    Non-medical: No  Tobacco Use  . Smoking status: Former Smoker    Years: 15.00    Types: Cigarettes    Last attempt to quit: 01/18/1971    Years since quitting: 46.8  .  Smokeless tobacco: Current User    Types: Snuff  Substance and Sexual Activity  . Alcohol use: No  . Drug use: No  . Sexual activity: Not Currently  Lifestyle  . Physical activity:    Days per week: 4 days    Minutes per session: 40 min  . Stress: Not at all  Relationships  . Social connections:    Talks on phone: More than three times a week    Gets together: More than three times a week    Attends religious service: 1 to 4 times per year    Active member of club or organization: Yes    Attends meetings of clubs or organizations: More than 4 times per year    Relationship status: Married  . Intimate partner violence:    Fear of current or ex partner: No    Emotionally abused: No    Physically abused: No    Forced sexual activity: No  Other Topics Concern  . Not on file  Social History Narrative   Regular exercise-yes    Allergies  Allergen Reactions  . Coreg [Carvedilol] Other (See Comments)    weakness  . Lovastatin     Leg weakness  . Morphine Sulfate Other (See Comments)    Per the pt" after taking morphine within 30 mins I was soaking wet, I did not like the way it made me feel"    Current Outpatient Medications  Medication Sig Dispense Refill  . amLODipine (NORVASC) 10 MG tablet Take 0.5 tablets (5 mg total) by mouth daily. 90 tablet 3  . aspirin 81 MG tablet Take 2 tablets (162 mg total) by mouth daily. 100 tablet 3  . Cholecalciferol (VITAMIN D3) 1000 UNITS tablet Take 1,000 Units by mouth every morning.     . metFORMIN (GLUCOPHAGE-XR) 500 MG 24 hr tablet Take 1 tablet (500 mg total) by mouth 2 (two) times daily. 180 tablet 1  . pravastatin (PRAVACHOL) 40 MG tablet Take 1 tablet (40 mg total) by mouth daily. 90 tablet 3  . valsartan-hydrochlorothiazide (DIOVAN HCT) 320-25 MG tablet Take 1 tablet by mouth daily. 90 tablet 3  . Pitavastatin Calcium 2 MG TABS Take 1 tablet (2 mg total) by mouth daily. (Patient not taking: Reported on 11/24/2017) 30 tablet 11  .  repaglinide (PRANDIN) 0.5 MG tablet Take 1 tablet (0.5 mg total) by mouth 3 (three) times daily before meals. (Patient not taking: Reported on 11/24/2017) 270 tablet 3   No current facility-administered medications for this visit.     ROS:   General:  No weight loss, Fever, chills  HEENT: No recent headaches, no nasal bleeding, no visual changes, no sore throat  Neurologic: No dizziness, blackouts, seizures. No recent symptoms of stroke or mini- stroke. No recent episodes of slurred speech, or temporary blindness.  Cardiac: No recent episodes of chest pain/pressure, no shortness of breath at rest.  No shortness of breath with exertion.  Denies history of atrial fibrillation or irregular heartbeat  Vascular: No history of rest pain in feet.  No history of claudication.  No history of non-healing ulcer, No history of DVT   Pulmonary: No home oxygen, no productive cough, no hemoptysis,  No asthma or wheezing  Musculoskeletal:  [X]  Arthritis, [X]  Low back pain,  [X]  Joint pain  Hematologic:No history of hypercoagulable state.  No history of easy bleeding.  No history of anemia  Gastrointestinal: No hematochezia or melena,  No gastroesophageal reflux, no trouble swallowing  Urinary: [X]  chronic Kidney disease, [ ]  on HD - [ ]  MWF or [ ]  TTHS, [ ]  Burning with urination, [ ]  Frequent urination, [ ]  Difficulty urinating;   Skin: No rashes  Psychological: No history of anxiety,  No history of depression   Physical Examination  Vitals:   11/24/17 1305  BP: (!) 146/84  Pulse: 82  Resp: 18  Temp: (!) 97.3 F (36.3 C)  TempSrc: Oral  SpO2: 97%  Weight: 199 lb (90.3 kg)  Height: 5' 7.5" (1.715 m)    Body mass index is 30.71 kg/m.  General:  Alert and oriented, no acute distress HEENT: Normal Neck: No bruit or JVD Pulmonary: Clear to auscultation bilaterally Cardiac: Regular Rate and Rhythm without murmur Abdomen: Soft, non-tender, non-distended, no mass Skin: No  rash Extremity Pulses:  2+ radial, brachial, femoral, popliteal absent dorsalis pedis, posterior tibial pulses bilaterally Musculoskeletal: No deformity or edema  Neurologic: Upper and lower extremity motor 5/5 and symmetric  DATA:  Had bilateral ABIs performed early September 2019.  I reviewed these today.  Right side was normal at 1.12.  Left side was nearly normal at 0.95.  He also had a lower extremity duplex exam which showed occlusion of the anterior tibial artery on the left occlusion of the posterior tibial on the right but patent peroneal and anterior tibial artery on the right patent peroneal and posterior tibial artery on the left  ASSESSMENT: Lateral leg pain and balance issues.  Patient has essentially normal ABIs bilaterally.  He did have some evidence of tibial artery occlusive disease but had widely patent peroneal and posterior tibial artery on the left peroneal and anterior tibial artery on the right which should be adequate perfusion for both lower extremities.  His symptoms are also not really consistent with claudication and more related to pseudoclaudication from back issues.   PLAN: Patient was told to walk 30 minutes daily to improve his overall symptoms.  He will discuss further with Dr. Alain Marion whether or not this bothersome enough that he would wish to further evaluate his back.  He has adequate perfusion to both lower extremities currently.  No current need for vascular intervention with mild tibial artery occlusive disease.  The patient will follow-up in the on as-needed basis if his symptoms worsen over time.   Ruta Hinds, MD [facility] Office: [phone] Pager: [phone]

## 2017-11-30 ENCOUNTER — Other Ambulatory Visit: Payer: Self-pay | Admitting: Internal Medicine

## 2017-12-22 ENCOUNTER — Other Ambulatory Visit (INDEPENDENT_AMBULATORY_CARE_PROVIDER_SITE_OTHER): Payer: Medicare HMO

## 2017-12-22 ENCOUNTER — Encounter: Payer: Self-pay | Admitting: Internal Medicine

## 2017-12-22 ENCOUNTER — Other Ambulatory Visit: Payer: Self-pay | Admitting: Internal Medicine

## 2017-12-22 ENCOUNTER — Ambulatory Visit (INDEPENDENT_AMBULATORY_CARE_PROVIDER_SITE_OTHER): Payer: Medicare HMO | Admitting: Internal Medicine

## 2017-12-22 VITALS — BP 140/80 | HR 91 | Temp 98.1°F | Ht 67.5 in | Wt 200.0 lb

## 2017-12-22 DIAGNOSIS — I10 Essential (primary) hypertension: Secondary | ICD-10-CM | POA: Diagnosis not present

## 2017-12-22 DIAGNOSIS — R5383 Other fatigue: Secondary | ICD-10-CM

## 2017-12-22 DIAGNOSIS — E785 Hyperlipidemia, unspecified: Secondary | ICD-10-CM | POA: Diagnosis not present

## 2017-12-22 DIAGNOSIS — R269 Unspecified abnormalities of gait and mobility: Secondary | ICD-10-CM | POA: Diagnosis not present

## 2017-12-22 DIAGNOSIS — Z77018 Contact with and (suspected) exposure to other hazardous metals: Secondary | ICD-10-CM

## 2017-12-22 DIAGNOSIS — E1122 Type 2 diabetes mellitus with diabetic chronic kidney disease: Secondary | ICD-10-CM

## 2017-12-22 DIAGNOSIS — G8929 Other chronic pain: Secondary | ICD-10-CM | POA: Diagnosis not present

## 2017-12-22 DIAGNOSIS — M4808 Spinal stenosis, sacral and sacrococcygeal region: Secondary | ICD-10-CM | POA: Diagnosis not present

## 2017-12-22 DIAGNOSIS — N183 Chronic kidney disease, stage 3 (moderate): Secondary | ICD-10-CM | POA: Diagnosis not present

## 2017-12-22 DIAGNOSIS — M5440 Lumbago with sciatica, unspecified side: Secondary | ICD-10-CM

## 2017-12-22 DIAGNOSIS — I2510 Atherosclerotic heart disease of native coronary artery without angina pectoris: Secondary | ICD-10-CM | POA: Diagnosis not present

## 2017-12-22 LAB — BASIC METABOLIC PANEL
BUN: 23 mg/dL (ref 6–23)
CO2: 26 mEq/L (ref 19–32)
Calcium: 9.5 mg/dL (ref 8.4–10.5)
Chloride: 102 mEq/L (ref 96–112)
Creatinine, Ser: 1.35 mg/dL (ref 0.40–1.50)
GFR: 53.94 mL/min — AB (ref >60.00)
Glucose, Bld: 202 mg/dL — ABNORMAL HIGH (ref 70–99)
Potassium: 4.1 mEq/L (ref 3.5–5.1)
Sodium: 139 mEq/L (ref 135–145)

## 2017-12-22 LAB — VITAMIN B12: Vitamin B-12: 262 pg/mL (ref 211–911)

## 2017-12-22 LAB — HEMOGLOBIN A1C: HEMOGLOBIN A1C: 7.6 % — AB (ref 4.6–6.5)

## 2017-12-22 LAB — CK: Total CK: 41 U/L (ref 7–232)

## 2017-12-22 MED ORDER — B COMPLEX PO TABS: 1.0000 | ORAL_TABLET | Freq: Every day | ORAL | 3 refills | Status: DC

## 2017-12-22 NOTE — Assessment & Plan Note (Signed)
Diovan HCT, Norvasc

## 2017-12-22 NOTE — Assessment & Plan Note (Signed)
ASA, Amlodipine, Lovastatin

## 2017-12-22 NOTE — Addendum Note (Signed)
Addended by: Karren Cobble on: 12/22/2017 09:06 AM   Modules accepted: Orders

## 2017-12-22 NOTE — Patient Instructions (Signed)
Spinal Stenosis Spinal stenosis happens when the open space (spinal canal) between the bones of your spine (vertebrae) gets smaller. It is caused by bone pushing into the open spaces of your backbone (spine). This puts pressure on your backbone and the nerves in your backbone. Treatment often focuses on managing any pain and symptoms. In some cases, surgery may be needed. Follow these instructions at home: Managing pain, stiffness, and swelling  Do all exercises and stretches as told by your doctor.  Stand and sit up straight (use good posture). If you were given a brace or a corset, wear it as told by your doctor.  Do not do any activities that cause pain. Ask your doctor what activities are safe for you.  Do not lift anything that is heavier than 10 lb (4.5 kg) or heavier than your doctor tells you.  Try to stay at a healthy weight. Talk with your doctor if you need help losing weight.  If directed, put heat on the affected area as often as told by your doctor. Use the heat source that your doctor recommends, such as a moist heat pack or a heating pad. ? Put a towel between your skin and the heat source. ? Leave the heat on for 20-30 minutes. ? Remove the heat if your skin turns bright red. This is especially important if you are not able to feel pain, heat, or cold. You may have a greater risk of getting burned. General instructions  Take over-the-counter and prescription medicines only as told by your doctor.  Do not use any products that contain nicotine or tobacco, such as cigarettes and e-cigarettes. If you need help quitting, ask your doctor.  Eat a healthy diet. This includes plenty of fruits and vegetables, whole grains, and low-fat (lean) protein.  Keep all follow-up visits as told by your doctor. This is important. Contact a doctor if:  Your symptoms do not get better.  Your symptoms get worse.  You have a fever. Get help right away if:  You have new or worse pain in  your neck or upper back.  You have very bad pain that medicine does not control.  You are dizzy.  You have vision problems, blurred vision, or double vision.  You have a very bad headache that is worse when you stand.  You feel sick to your stomach (nauseous).  You throw up (vomit).  You have new or worse numbness or tingling in your back or legs.  You have pain, redness, swelling, or warmth in your arm or leg. Summary  Spinal stenosis happens when the open space (spinal canal) between the bones of your spine gets smaller (narrow).  Contact a doctor if your symptoms get worse.  In some cases, surgery may be needed. This information is not intended to replace advice given to you by your health care provider. Make sure you discuss any questions you have with your health care provider. Document Released: 04/23/2010 Document Revised: 12/03/2015 Document Reviewed: 12/03/2015 Elsevier Interactive Patient Education  2017 [employer]  

## 2017-12-22 NOTE — Assessment & Plan Note (Signed)
Labs

## 2017-12-22 NOTE — Assessment & Plan Note (Addendum)
?  spinal stenosis. Doubt statin effect Vasc eval - Dr Oneida Alar - [place] LS spine MRI PT did not help

## 2017-12-22 NOTE — Progress Notes (Signed)
Subjective:  Patient ID: IDUS Jeff Burgess, male    DOB: 1935/02/26  Age: 82 y.o. MRN: [id number]  CC: No chief complaint on file.   HPI Jeff Burgess presents for worsening gait problems: poor balance , staggering, leg weakness. There is some LBP. Moving better when leaning over a shopping cart F/u CAD, HTN  Outpatient Medications Prior to Visit  Medication Sig Dispense Refill  . amLODipine (NORVASC) 10 MG tablet Take 0.5 tablets (5 mg total) by mouth daily. 90 tablet 3  . aspirin 81 MG tablet Take 2 tablets (162 mg total) by mouth daily. 100 tablet 3  . Cholecalciferol (VITAMIN D3) 1000 UNITS tablet Take 1,000 Units by mouth every morning.     . metFORMIN (GLUCOPHAGE-XR) 500 MG 24 hr tablet TAKE ONE TABLET BY MOUTH TWICE DAILY 180 tablet 0  . pravastatin (PRAVACHOL) 40 MG tablet Take 1 tablet (40 mg total) by mouth daily. 90 tablet 3  . valsartan-hydrochlorothiazide (DIOVAN HCT) 320-25 MG tablet Take 1 tablet by mouth daily. 90 tablet 3  . Pitavastatin Calcium 2 MG TABS Take 1 tablet (2 mg total) by mouth daily. (Patient not taking: Reported on 11/24/2017) 30 tablet 11  . repaglinide (PRANDIN) 0.5 MG tablet Take 1 tablet (0.5 mg total) by mouth 3 (three) times daily before meals. (Patient not taking: Reported on 11/24/2017) 270 tablet 3   No facility-administered medications prior to visit.     ROS: Review of Systems  Constitutional: Negative for appetite change, fatigue and unexpected weight change.  HENT: Negative for congestion, nosebleeds, sneezing, sore throat and trouble swallowing.   Eyes: Negative for itching and visual disturbance.  Respiratory: Negative for cough.   Cardiovascular: Negative for chest pain, palpitations and leg swelling.  Gastrointestinal: Negative for abdominal distention, blood in stool, diarrhea and nausea.  Genitourinary: Negative for frequency and hematuria.  Musculoskeletal: Positive for back pain and gait problem. Negative for joint swelling and  neck pain.  Skin: Negative for rash.  Neurological: Positive for weakness. Negative for dizziness, tremors and speech difficulty.  Psychiatric/Behavioral: Negative for agitation, dysphoric mood, sleep disturbance and suicidal ideas. The patient is not nervous/anxious.     Objective:  BP 140/80 (BP Location: Left Arm, Patient Position: Sitting, Cuff Size: Large)   Pulse 91   Temp 98.1 F (36.7 C) (Oral)   Ht 5' 7.5" (1.715 m)   Wt 200 lb (90.7 kg)   SpO2 97%   BMI 30.86 kg/m   BP Readings from Last 3 Encounters:  12/22/17 140/80  11/24/17 (!) 146/84  09/19/17 126/62    Wt Readings from Last 3 Encounters:  12/22/17 200 lb (90.7 kg)  11/24/17 199 lb (90.3 kg)  09/19/17 196 lb (88.9 kg)    Physical Exam Constitutional:      General: He is not in acute distress.    Appearance: He is well-developed.     Comments: NAD  Eyes:     Conjunctiva/sclera: Conjunctivae normal.     Pupils: Pupils are equal, round, and reactive to light.  Neck:     Musculoskeletal: Normal range of motion.     Thyroid: No thyromegaly.     Vascular: No JVD.  Cardiovascular:     Rate and Rhythm: Normal rate and regular rhythm.     Heart sounds: Normal heart sounds. No murmur. No friction rub. No gallop.   Pulmonary:     Effort: Pulmonary effort is normal. No respiratory distress.     Breath sounds: Normal breath sounds. No  wheezing or rales.  Chest:     Chest wall: No tenderness.  Abdominal:     General: Bowel sounds are normal. There is no distension.     Palpations: Abdomen is soft. There is no mass.     Tenderness: There is no abdominal tenderness. There is no guarding or rebound.  Musculoskeletal: Normal range of motion.        General: No tenderness.  Lymphadenopathy:     Cervical: No cervical adenopathy.  Skin:    General: Skin is warm and dry.     Findings: No rash.  Neurological:     Mental Status: He is alert and oriented to person, place, and time.     Cranial Nerves: No cranial  nerve deficit.     Motor: No abnormal muscle tone.     Coordination: Coordination normal.     Gait: Gait normal.     Deep Tendon Reflexes: Reflexes are normal and symmetric.  Psychiatric:        Behavior: Behavior normal.        Thought Content: Thought content normal.        Judgment: Judgment normal.   LS stiff, tender Legs are weak 5-/5 Wide base gait, ataxic  Lab Results  Component Value Date   WBC 10.2 10/08/2015   HGB 15.7 10/08/2015   HCT 45.3 10/08/2015   PLT 211 10/08/2015   GLUCOSE 158 (H) 09/19/2017   CHOL 122 10/03/2012   TRIG 166.0 (H) 10/03/2012   HDL 29.20 (L) 10/03/2012   LDLDIRECT 78.4 11/02/2011   LDLCALC 60 10/03/2012   ALT 16 08/11/2015   AST 14 08/11/2015   NA 141 09/19/2017   K 4.4 09/19/2017   CL 102 09/19/2017   CREATININE 1.30 09/19/2017   BUN 18 09/19/2017   CO2 29 09/19/2017   TSH 1.53 10/03/2012   PSA 1.48 10/03/2012   INR 1.02 10/08/2015   HGBA1C 6.7 (H) 09/19/2017    Vas [place] Burnard Bunting With/wo Tbi  Result Date: 09/28/2017 LOWER EXTREMITY DOPPLER STUDY Indications: General lower extremity fatigue and reduced balance. High Risk         Hypertension, hyperlipidemia, Diabetes, past history of Factors:          smoking.  Performing Technologist: Delorise Shiner RVT  Examination Guidelines: A complete evaluation includes at minimum, Doppler waveform signals and systolic blood pressure reading at the level of bilateral brachial, anterior tibial, and posterior tibial arteries, when vessel segments are accessible. Bilateral testing is considered an integral part of a complete examination. Photoelectric Plethysmograph (PPG) waveforms and toe systolic pressure readings are included as required and additional duplex testing as needed. Limited examinations for reoccurring indications may be performed as noted.  ABI Findings: +---------+------------------+-----+---------+--------+ Right    Rt Pressure (mmHg)IndexWaveform Comment   +---------+------------------+-----+---------+--------+ Brachial 154                                      +---------+------------------+-----+---------+--------+ ATA      133               0.86 triphasic         +---------+------------------+-----+---------+--------+ PTA      173               1.12 triphasic         +---------+------------------+-----+---------+--------+ PERO     153  0.99 triphasic         +---------+------------------+-----+---------+--------+ Great Toe64                0.42                   +---------+------------------+-----+---------+--------+ +---------+------------------+-----+----------+-------+ Left     Lt Pressure (mmHg)IndexWaveform  Comment +---------+------------------+-----+----------+-------+ Brachial 154                                      +---------+------------------+-----+----------+-------+ ATA      79                0.51 monophasic        +---------+------------------+-----+----------+-------+ PTA      100               0.65 monophasic        +---------+------------------+-----+----------+-------+ PERO     147               0.95 monophasic        +---------+------------------+-----+----------+-------+ Great Toe46                0.30                   +---------+------------------+-----+----------+-------+ +-------+-----------+-----------+------------+------------+ ABI/TBIToday's ABIToday's TBIPrevious ABIPrevious TBI +-------+-----------+-----------+------------+------------+ Right  1.12       0.42                                +-------+-----------+-----------+------------+------------+ Left   0.95       0.30                                +-------+-----------+-----------+------------+------------+  Final Interpretation: Right: Resting right ankle-brachial index is within normal range. No evidence of significant right lower extremity arterial disease. The right toe-brachial index  is abnormal. RT great toe pressure = 64 mmHg. Left: Resting left ankle-brachial index is within normal range. No evidence of significant left lower extremity arterial disease. The left toe-brachial index is abnormal. LT Great toe pressure = 46 mmHg.  *See table(s) above for measurements and observations.  Electronically signed by Deitra Mayo MD on 09/28/2017 at 9:30:44 AM.    Final    Vas [place] Lower Extremity Arterial Duplex  Result Date: 09/28/2017 LOWER EXTREMITY ARTERIAL DUPLEX STUDY Indications: General lower extremity fatigue and reduced balance. High Risk Factors: Hypertension, hyperlipidemia, past history of smoking.  Current ABI: Right: 1.12, Left: 0.95 Performing Technologist: Delorise Shiner RVT  Examination Guidelines: A complete evaluation includes B-mode imaging, spectral Doppler, color Doppler, and power Doppler as needed of all accessible portions of each vessel. Bilateral testing is considered an integral part of a complete examination. Limited examinations for reoccurring indications may be performed as noted.  Right Duplex Findings: +-----------+--------+-----+--------+----------+--------+            PSV cm/sRatioStenosisWaveform  Comments +-----------+--------+-----+--------+----------+--------+ CFA Distal 92                   biphasic           +-----------+--------+-----+--------+----------+--------+ DFA        73                   biphasic           +-----------+--------+-----+--------+----------+--------+ SFA Prox  78                   biphasic           +-----------+--------+-----+--------+----------+--------+ SFA Mid    84                   biphasic           +-----------+--------+-----+--------+----------+--------+ SFA Distal 69                   biphasic           +-----------+--------+-----+--------+----------+--------+ POP Mid    87                   triphasic          +-----------+--------+-----+--------+----------+--------+ ATA  Distal 29                   biphasic           +-----------+--------+-----+--------+----------+--------+ PTA Mid    0            occluded                   +-----------+--------+-----+--------+----------+--------+ PTA Distal 38                   monophasic         +-----------+--------+-----+--------+----------+--------+ PERO Distal94                   triphasic          +-----------+--------+-----+--------+----------+--------+ A focal velocity elevation of 0 cm/s was obtained at Mid posterior tibial. Findings are characteristic of occluded.  Left Duplex Findings: +-----------+--------+-----+--------+----------+--------+            PSV cm/sRatioStenosisWaveform  Comments +-----------+--------+-----+--------+----------+--------+ CFA Distal 131                  biphasic           +-----------+--------+-----+--------+----------+--------+ DFA        92                   biphasic           +-----------+--------+-----+--------+----------+--------+ SFA Prox   80                   biphasic           +-----------+--------+-----+--------+----------+--------+ SFA Mid    97                   biphasic           +-----------+--------+-----+--------+----------+--------+ SFA Distal 86                   triphasic          +-----------+--------+-----+--------+----------+--------+ POP Mid    57                   biphasic           +-----------+--------+-----+--------+----------+--------+ ATA Distal 0            occluded                   +-----------+--------+-----+--------+----------+--------+ PTA Distal 33                   biphasic           +-----------+--------+-----+--------+----------+--------+ PERO Distal57  monophasic         +-----------+--------+-----+--------+----------+--------+ A focal velocity elevation of 0 cm/s was obtained at Anterior tibial artery. Findings are characteristic of total occlusion.  Final  Interpretation: Right: Total occlusion noted in the posterior tibial artery. Left: Total occlusion noted in the anterior tibial artery.  See table(s) above for measurements and observations. Electronically signed by Deitra Mayo MD on 09/28/2017 at 10:24:22 AM.    Final     Assessment & Plan:   There are no diagnoses linked to this encounter.   No orders of the defined types were placed in this encounter.    Follow-up: No follow-ups on file.  Walker Kehr, MD

## 2018-01-01 ENCOUNTER — Other Ambulatory Visit: Payer: Self-pay | Admitting: Internal Medicine

## 2018-01-09 ENCOUNTER — Ambulatory Visit: Payer: Medicare HMO | Admitting: Internal Medicine

## 2018-01-13 ENCOUNTER — Other Ambulatory Visit: Payer: Self-pay

## 2018-01-13 ENCOUNTER — Ambulatory Visit
Admission: RE | Admit: 2018-01-13 | Discharge: 2018-01-13 | Disposition: A | Discharge status: Home / Self Care | Payer: Medicare Other | Source: Ambulatory Visit | Attending: Internal Medicine | Admitting: Internal Medicine

## 2018-01-13 DIAGNOSIS — Z77018 Contact with and (suspected) exposure to other hazardous metals: Secondary | ICD-10-CM

## 2018-01-13 DIAGNOSIS — Z01818 Encounter for other preprocedural examination: Secondary | ICD-10-CM | POA: Diagnosis not present

## 2018-01-13 DIAGNOSIS — M48061 Spinal stenosis, lumbar region without neurogenic claudication: Secondary | ICD-10-CM | POA: Diagnosis not present

## 2018-01-13 DIAGNOSIS — R269 Unspecified abnormalities of gait and mobility: Secondary | ICD-10-CM

## 2018-01-13 DIAGNOSIS — M4808 Spinal stenosis, sacral and sacrococcygeal region: Secondary | ICD-10-CM

## 2018-01-13 MED ORDER — REPAGLINIDE 0.5 MG PO TABS: 0.5000 mg | ORAL_TABLET | Freq: Three times a day (TID) | ORAL | 3 refills | Status: DC

## 2018-01-13 MED ORDER — PRAVASTATIN SODIUM 40 MG PO TABS: 40.0000 mg | ORAL_TABLET | Freq: Every day | ORAL | 3 refills | Status: DC

## 2018-01-13 MED ORDER — VALSARTAN-HYDROCHLOROTHIAZIDE 320-25 MG PO TABS: 1.0000 | ORAL_TABLET | Freq: Every day | ORAL | 3 refills | Status: DC

## 2018-01-13 MED ORDER — AMLODIPINE BESYLATE 10 MG PO TABS: 5.0000 mg | ORAL_TABLET | Freq: Every day | ORAL | 3 refills | Status: DC

## 2018-01-13 MED ORDER — METFORMIN HCL ER 500 MG PO TB24: 500.0000 mg | ORAL_TABLET | Freq: Two times a day (BID) | ORAL | 3 refills | Status: DC

## 2018-01-16 ENCOUNTER — Ambulatory Visit (INDEPENDENT_AMBULATORY_CARE_PROVIDER_SITE_OTHER): Payer: Medicare Other | Admitting: Internal Medicine

## 2018-01-16 ENCOUNTER — Other Ambulatory Visit: Payer: Self-pay | Admitting: Internal Medicine

## 2018-01-16 ENCOUNTER — Encounter: Payer: Self-pay | Admitting: Internal Medicine

## 2018-01-16 DIAGNOSIS — M5440 Lumbago with sciatica, unspecified side: Secondary | ICD-10-CM | POA: Diagnosis not present

## 2018-01-16 DIAGNOSIS — R269 Unspecified abnormalities of gait and mobility: Secondary | ICD-10-CM

## 2018-01-16 DIAGNOSIS — M25551 Pain in right hip: Secondary | ICD-10-CM

## 2018-01-16 DIAGNOSIS — G8929 Other chronic pain: Secondary | ICD-10-CM | POA: Diagnosis not present

## 2018-01-16 DIAGNOSIS — M25552 Pain in left hip: Secondary | ICD-10-CM | POA: Diagnosis not present

## 2018-01-16 DIAGNOSIS — R29898 Other symptoms and signs involving the musculoskeletal system: Secondary | ICD-10-CM

## 2018-01-16 DIAGNOSIS — M5442 Lumbago with sciatica, left side: Secondary | ICD-10-CM

## 2018-01-16 MED ORDER — METHYLPREDNISOLONE ACETATE 40 MG/ML IJ SUSP
40.0000 mg | Freq: Once | INTRAMUSCULAR | Status: AC
  Administered 2018-01-16: 40 mg via INTRA_ARTICULAR

## 2018-01-16 NOTE — Assessment & Plan Note (Signed)
LS spine MRI 02/09/18: IMPRESSION: 1. Advanced disc degeneration at L4-5 and below and advanced facet arthropathy at L3-4 and below. 2. Moderate spinal stenosis at L3-4 and L4-5 from degenerative factors and short pedicles. 3. L5-S1 advanced left foraminal impingement. 4. L4-5 moderate bilateral foraminal stenosis. Electronically Signed   By: Monte Fantasia M.D.   On: 01/13/2018 10:32   Ref to NS

## 2018-01-16 NOTE — Assessment & Plan Note (Signed)
See procedure B

## 2018-01-16 NOTE — Patient Instructions (Signed)
Postprocedure instructions :    A Band-Aid should be left on for 12 hours. Injection therapy is not a cure itself. It is used in conjunction with other modalities. You can use nonsteroidal anti-inflammatories like ibuprofen , hot and cold compresses. Rest is recommended in the next 24 hours. You need to report immediately  if fever, chills or any signs of infection develop. 

## 2018-01-16 NOTE — Progress Notes (Signed)
Subjective:  Patient ID: Jeff Burgess, male    DOB: 03-11-1935  Age: 83 y.o. MRN: [id number]  CC: No chief complaint on file.   HPI Jeff Burgess presents for LBP - pain is not bad; and LLE weakness - not better. C/o B hip pain - worse   Outpatient Medications Prior to Visit  Medication Sig Dispense Refill  . amLODipine (NORVASC) 10 MG tablet Take 0.5 tablets (5 mg total) by mouth daily. 90 tablet 3  . aspirin 81 MG tablet Take 2 tablets (162 mg total) by mouth daily. 100 tablet 3  . b complex vitamins tablet Take 1 tablet by mouth daily. 100 tablet 3  . Cholecalciferol (VITAMIN D3) 1000 UNITS tablet Take 1,000 Units by mouth every morning.     . metFORMIN (GLUCOPHAGE-XR) 500 MG 24 hr tablet Take 1 tablet (500 mg total) by mouth 2 (two) times daily. 180 tablet 3  . pravastatin (PRAVACHOL) 40 MG tablet Take 1 tablet (40 mg total) by mouth daily. 90 tablet 3  . repaglinide (PRANDIN) 0.5 MG tablet Take 1 tablet (0.5 mg total) by mouth 3 (three) times daily before meals. 270 tablet 3  . valsartan-hydrochlorothiazide (DIOVAN HCT) 320-25 MG tablet Take 1 tablet by mouth daily. 90 tablet 3   No facility-administered medications prior to visit.     ROS: Review of Systems  Constitutional: Negative for appetite change, fatigue and unexpected weight change.  HENT: Negative for congestion, nosebleeds, sneezing, sore throat and trouble swallowing.   Eyes: Negative for itching and visual disturbance.  Respiratory: Negative for cough.   Cardiovascular: Negative for chest pain, palpitations and leg swelling.  Gastrointestinal: Negative for abdominal distention, blood in stool, diarrhea and nausea.  Genitourinary: Negative for frequency and hematuria.  Musculoskeletal: Positive for back pain and gait problem. Negative for joint swelling and neck pain.  Skin: Negative for rash.  Neurological: Negative for dizziness, tremors, speech difficulty and weakness.  Psychiatric/Behavioral: Negative  for agitation, dysphoric mood, sleep disturbance and suicidal ideas. The patient is not nervous/anxious.     Objective:  BP (!) 142/76 (BP Location: Right Arm, Patient Position: Sitting, Cuff Size: Large)   Pulse 68   Temp 98 F (36.7 C) (Oral)   Ht 5' 7.5" (1.715 m)   Wt 200 lb (90.7 kg)   SpO2 97%   BMI 30.86 kg/m   BP Readings from Last 3 Encounters:  01/16/18 (!) 142/76  12/22/17 140/80  11/24/17 (!) 146/84    Wt Readings from Last 3 Encounters:  01/16/18 200 lb (90.7 kg)  12/22/17 200 lb (90.7 kg)  11/24/17 199 lb (90.3 kg)    Physical Exam Constitutional:      General: He is not in acute distress.    Appearance: He is well-developed.     Comments: NAD  Eyes:     Conjunctiva/sclera: Conjunctivae normal.     Pupils: Pupils are equal, round, and reactive to light.  Neck:     Musculoskeletal: Normal range of motion.     Thyroid: No thyromegaly.     Vascular: No JVD.  Cardiovascular:     Rate and Rhythm: Normal rate and regular rhythm.     Heart sounds: Normal heart sounds. No murmur. No friction rub. No gallop.   Pulmonary:     Effort: Pulmonary effort is normal. No respiratory distress.     Breath sounds: Normal breath sounds. No wheezing or rales.  Chest:     Chest wall: No tenderness.  Abdominal:  General: Bowel sounds are normal. There is no distension.     Palpations: Abdomen is soft. There is no mass.     Tenderness: There is no abdominal tenderness. There is no guarding or rebound.  Musculoskeletal: Normal range of motion.        General: Tenderness present.  Lymphadenopathy:     Cervical: No cervical adenopathy.  Skin:    General: Skin is warm and dry.     Findings: No rash.  Neurological:     Mental Status: He is alert and oriented to person, place, and time.     Cranial Nerves: No cranial nerve deficit.     Motor: Weakness present. No abnormal muscle tone.     Coordination: Coordination normal.     Gait: Gait normal.     Deep Tendon  Reflexes: Reflexes are normal and symmetric.  Psychiatric:        Behavior: Behavior normal.        Thought Content: Thought content normal.        Judgment: Judgment normal.   LS is stiff LLE w/same weakness  B lat hips tender    Procedure Note :     Procedure : Joint Injection,  R and L  hip   Indication:  Trochanteric bursitis with refractory  chronic pain.   Risks including unsuccessful procedure , bleeding, infection, bruising, skin atrophy, "steroid flare-up" and others were explained to the patient in detail as well as the benefits. Informed consent was obtained and signed.   Tthe patient was placed in a comfortable lateral decubitus position. The point of maximal tenderness was identified. Skin was prepped with Betadine and alcohol. Then, a 5 cc syringe with a 2 inch long 24-gauge needle was used for a bursa injection.. The needle was advanced  Into the bursa. I injected the bursa with 4 mL of 2% lidocaine and 40 mg of Depo-Medrol each.  Band-Aid was applied B.   Tolerated well. Complications: None. Good pain relief following the procedure.   Postprocedure instructions :    A Band-Aid should be left on for 12 hours. Injection therapy is not a cure itself. It is used in conjunction with other modalities. You can use nonsteroidal anti-inflammatories like ibuprofen , hot and cold compresses. Rest is recommended in the next 24 hours. You need to report immediately  if fever, chills or any signs of infection develop.    Lab Results  Component Value Date   WBC 10.2 10/08/2015   HGB 15.7 10/08/2015   HCT 45.3 10/08/2015   PLT 211 10/08/2015   GLUCOSE 202 (H) 12/22/2017   CHOL 122 10/03/2012   TRIG 166.0 (H) 10/03/2012   HDL 29.20 (L) 10/03/2012   LDLDIRECT 78.4 11/02/2011   LDLCALC 60 10/03/2012   ALT 16 08/11/2015   AST 14 08/11/2015   NA 139 12/22/2017   K 4.1 12/22/2017   CL 102 12/22/2017   CREATININE 1.35 12/22/2017   BUN 23 12/22/2017   CO2 26 12/22/2017   TSH  1.53 10/03/2012   PSA 1.48 10/03/2012   INR 1.02 10/08/2015   HGBA1C 7.6 (H) 12/22/2017    Dg Eye Foreign Body  Result Date: 01/13/2018 CLINICAL DATA:  Metal working/exposure; clearance prior to MRI EXAM: ORBITS FOR FOREIGN BODY - 2 VIEW COMPARISON:  None. FINDINGS: No radiopaque foreign body.  Ok to proceed to MRI. IMPRESSION: No radiopaque foreign body.  Ok to proceed to MRI. Electronically Signed   By: Sandi Mariscal M.D.   On:  01/13/2018 09:24   Mr Lumbar Spine Wo Contrast  Result Date: 01/13/2018 CLINICAL DATA:  Low back pain with left leg weakness. EXAM: MRI LUMBAR SPINE WITHOUT CONTRAST TECHNIQUE: Multiplanar, multisequence MR imaging of the lumbar spine was performed. No intravenous contrast was administered. COMPARISON:  None. FINDINGS: Segmentation:  Standard lumbar numbering Alignment: Grade 1 anterolisthesis at L3-4, facet mediated. Mild retrolisthesis at L5-S1 accentuated by endplate ridging Vertebrae:  No fracture, evidence of discitis, or bone lesion. Conus medullaris and cauda equina: Conus extends to the L1 level. Conus and cauda equina appear normal. Paraspinal and other soft tissues: Negative Disc levels: Degenerative changes are superimposed on congenitally narrow lower lumbar spinal canal from short pedicles. T12- L1: Asymmetric right facet spurring.  No impingement L1-L2: Mild disc narrowing.  Bilateral facet spurring. L2-L3: Mild disc bulging and facet spurring.  No impingement L3-L4: Degenerative facet hypertrophy with slight anterolisthesis. Moderate spinal stenosis. L4-L5: Advanced disc narrowing with bulging and endplate ridging. Degenerative facet hypertrophy. There is moderate bilateral foraminal stenosis. Moderate spinal stenosis. L5-S1:Disc narrowing with endplate ridging and bulging. There is a superimposed central protrusion that is noncompressive. Facet hypertrophy asymmetric to the left where there is L5 foraminal mass-effect on sagittal images. The right foramen is patent.  IMPRESSION: 1. Advanced disc degeneration at L4-5 and below and advanced facet arthropathy at L3-4 and below. 2. Moderate spinal stenosis at L3-4 and L4-5 from degenerative factors and short pedicles. 3. L5-S1 advanced left foraminal impingement. 4. L4-5 moderate bilateral foraminal stenosis. Electronically Signed   By: Monte Fantasia M.D.   On: 01/13/2018 10:32    Assessment & Plan:   There are no diagnoses linked to this encounter.   No orders of the defined types were placed in this encounter.    Follow-up: No follow-ups on file.  Walker Kehr, MD

## 2018-01-16 NOTE — Addendum Note (Signed)
Addended by: Karren Cobble on: 01/16/2018 11:59 AM   Modules accepted: Orders

## 2018-01-19 ENCOUNTER — Other Ambulatory Visit: Payer: Self-pay | Admitting: Internal Medicine

## 2018-01-23 ENCOUNTER — Telehealth: Payer: Self-pay | Admitting: Internal Medicine

## 2018-01-23 ENCOUNTER — Encounter: Payer: Self-pay | Admitting: *Deleted

## 2018-01-23 MED ORDER — AMLODIPINE BESYLATE 10 MG PO TABS: 5.0000 mg | ORAL_TABLET | Freq: Every day | ORAL | 1 refills | Status: DC

## 2018-01-23 MED ORDER — VITAMIN D3 25 MCG (1000 UNIT) PO TABS: 1000.0000 [IU] | ORAL_TABLET | Freq: Every morning | ORAL | 0 refills | Status: AC

## 2018-01-23 MED ORDER — AMLODIPINE BESYLATE 5 MG PO TABS: 5.0000 mg | ORAL_TABLET | Freq: Every day | ORAL | 3 refills | Status: DC

## 2018-01-23 NOTE — Addendum Note (Signed)
Addended by: Cassandria Anger on: 01/23/2018 09:53 PM   Modules accepted: Orders

## 2018-01-23 NOTE — Addendum Note (Signed)
Addended by: Earnstine Regal on: 01/23/2018 02:13 PM   Modules accepted: Orders

## 2018-01-23 NOTE — Telephone Encounter (Signed)
OK 5 mg tabs - done Thx

## 2018-01-23 NOTE — Telephone Encounter (Signed)
Copied from [place] [id number]. Topic: Quick Communication - Rx Refill/Question >> Jan 23, 2018 11:33 AM Jeff Burgess wrote: Medication: amLODipine (NORVASC) 10 MG tablet [[id number]] .  Pt would like the 0.5 mg tabs and the the 1mg  tabs.  He is having trouble breaking it in half.  Per pharmacy   Has the patient contacted their pharmacy? Yes  (Agent: If no, request that the patient contact the pharmacy for the refill.) (Agent: If yes, when and what did the pharmacy advise?)  Preferred Pharmacy (with phone number or street name): [id number] - [place], [place] [phone] (Phone)   Agent: Please be advised that RX refills may take up to 3 business days. We ask that you follow-up with your pharmacy.

## 2018-01-23 NOTE — Telephone Encounter (Signed)
This encounter was created in error - please disregard.

## 2018-01-23 NOTE — Telephone Encounter (Signed)
contacted pharmacy to clarify request; spoke with Hedda Slade, Pharmacist; he states the order is for 10 mg tablet (1/2 tablet daily); these pills are not scored and the pt has a hard time breaking the medication; the pt would like to have 5 mg tablets instead; the pt was last seen by Dr Alain Marion, [facility], 01/16/2018; will route to office for final disposition.

## 2018-01-24 ENCOUNTER — Other Ambulatory Visit: Payer: Self-pay | Admitting: Internal Medicine

## 2018-01-30 ENCOUNTER — Telehealth: Payer: Self-pay | Admitting: Internal Medicine

## 2018-01-30 NOTE — Telephone Encounter (Signed)
Cecille Rubin, please advise on referral.

## 2018-01-30 NOTE — Telephone Encounter (Signed)
Spoke to pt  regarding referral

## 2018-01-30 NOTE — Telephone Encounter (Signed)
Jeff Burgess, please advise on prescription.

## 2018-01-30 NOTE — Telephone Encounter (Signed)
Copied from [place] [id number]. Topic: Quick Communication - See Telephone Encounter >> Jan 30, 2018  2:36 PM Bea Graff, NT wrote: CRM for notification. See Telephone encounter for: 01/30/18. Pt states the valsartan-hydrochlorothiazide (DIOVAN-HCT) 320-25 MG tablet is on backorder at his pharmacy and he has been out of medication for 2-3 weeks and would like to see what can be done. Pt also checking on referral to the neurosurgeon.

## 2018-02-22 ENCOUNTER — Other Ambulatory Visit: Payer: Self-pay | Admitting: Internal Medicine

## 2018-03-20 DIAGNOSIS — G959 Disease of spinal cord, unspecified: Secondary | ICD-10-CM | POA: Diagnosis not present

## 2018-03-22 ENCOUNTER — Other Ambulatory Visit: Payer: Self-pay | Admitting: Neurological Surgery

## 2018-03-22 DIAGNOSIS — G959 Disease of spinal cord, unspecified: Secondary | ICD-10-CM

## 2018-04-12 ENCOUNTER — Other Ambulatory Visit: Payer: Medicare Other

## 2018-04-24 ENCOUNTER — Ambulatory Visit: Payer: Medicare Other | Admitting: Internal Medicine

## 2018-05-31 DIAGNOSIS — E119 Type 2 diabetes mellitus without complications: Secondary | ICD-10-CM | POA: Diagnosis not present

## 2018-05-31 DIAGNOSIS — H2513 Age-related nuclear cataract, bilateral: Secondary | ICD-10-CM | POA: Diagnosis not present

## 2018-06-01 ENCOUNTER — Other Ambulatory Visit: Payer: Medicare Other

## 2018-06-23 ENCOUNTER — Other Ambulatory Visit (INDEPENDENT_AMBULATORY_CARE_PROVIDER_SITE_OTHER): Payer: Medicare Other

## 2018-06-23 ENCOUNTER — Encounter: Payer: Self-pay | Admitting: Internal Medicine

## 2018-06-23 ENCOUNTER — Ambulatory Visit (INDEPENDENT_AMBULATORY_CARE_PROVIDER_SITE_OTHER): Payer: Medicare Other | Admitting: Internal Medicine

## 2018-06-23 ENCOUNTER — Other Ambulatory Visit: Payer: Self-pay

## 2018-06-23 DIAGNOSIS — I10 Essential (primary) hypertension: Secondary | ICD-10-CM | POA: Diagnosis not present

## 2018-06-23 DIAGNOSIS — E785 Hyperlipidemia, unspecified: Secondary | ICD-10-CM | POA: Diagnosis not present

## 2018-06-23 DIAGNOSIS — E1122 Type 2 diabetes mellitus with diabetic chronic kidney disease: Secondary | ICD-10-CM

## 2018-06-23 DIAGNOSIS — N183 Chronic kidney disease, stage 3 (moderate): Secondary | ICD-10-CM | POA: Diagnosis not present

## 2018-06-23 DIAGNOSIS — R269 Unspecified abnormalities of gait and mobility: Secondary | ICD-10-CM | POA: Diagnosis not present

## 2018-06-23 DIAGNOSIS — G8929 Other chronic pain: Secondary | ICD-10-CM

## 2018-06-23 DIAGNOSIS — M5440 Lumbago with sciatica, unspecified side: Secondary | ICD-10-CM

## 2018-06-23 LAB — BASIC METABOLIC PANEL
BUN: 15 mg/dL (ref 6–23)
CO2: 29 mEq/L (ref 19–32)
Calcium: 9.7 mg/dL (ref 8.4–10.5)
Chloride: 105 mEq/L (ref 96–112)
Creatinine, Ser: 1.14 mg/dL (ref 0.40–1.50)
GFR: 61.61 mL/min (ref >60.00)
Glucose, Bld: 94 mg/dL (ref 70–99)
Potassium: 4.3 mEq/L (ref 3.5–5.1)
Sodium: 141 mEq/L (ref 135–145)

## 2018-06-23 LAB — HEMOGLOBIN A1C: Hgb A1c MFr Bld: 6.8 % — ABNORMAL HIGH (ref 4.6–6.5)

## 2018-06-23 NOTE — Assessment & Plan Note (Signed)
Prandin, Metformin Labs 

## 2018-06-23 NOTE — Assessment & Plan Note (Signed)
Spinal fusion was offered by NS. Pt says - no LBP, just LE weakness. Pt wants to put surgery on hold Hold Pravastatin x 2 weeks

## 2018-06-23 NOTE — Assessment & Plan Note (Addendum)
Pravastatin -hold x 2 wks

## 2018-06-23 NOTE — Progress Notes (Signed)
Subjective:  Patient ID: Jeff Burgess, male    DOB: [birth year]  Age: 83 y.o. MRN: [id number]  CC: No chief complaint on file.   HPI GENTRY PILSON presents for CAD, LBP, DM2 f/u. C/o gait disorder  Outpatient Medications Prior to Visit  Medication Sig Dispense Refill   amLODipine (NORVASC) 5 MG tablet Take 1 tablet (5 mg total) by mouth daily. 90 tablet 3   aspirin 81 MG tablet Take 2 tablets (162 mg total) by mouth daily. 100 tablet 3   b complex vitamins tablet Take 1 tablet by mouth daily. 100 tablet 3   cholecalciferol (VITAMIN D) 25 MCG (1000 UT) tablet Take 1 tablet (1,000 Units total) by mouth every morning. Follow-up due in April must see provider for refills 90 tablet 0   metFORMIN (GLUCOPHAGE-XR) 500 MG 24 hr tablet TAKE ONE TABLET BY MOUTH TWICE DAILY 180 tablet 3   pravastatin (PRAVACHOL) 40 MG tablet Take 1 tablet (40 mg total) by mouth daily. 90 tablet 3   repaglinide (PRANDIN) 0.5 MG tablet Take 1 tablet (0.5 mg total) by mouth 3 (three) times daily before meals. 270 tablet 3   valsartan-hydrochlorothiazide (DIOVAN-HCT) 320-25 MG tablet TAKE ONE TABLET BY MOUTH ONCE DAILY 90 tablet 2   No facility-administered medications prior to visit.     ROS: Review of Systems  Constitutional: Negative for appetite change, fatigue and unexpected weight change.  HENT: Negative for congestion, nosebleeds, sneezing, sore throat and trouble swallowing.   Eyes: Negative for itching and visual disturbance.  Respiratory: Negative for cough.   Cardiovascular: Negative for chest pain, palpitations and leg swelling.  Gastrointestinal: Negative for abdominal distention, blood in stool, diarrhea and nausea.  Genitourinary: Negative for frequency and hematuria.  Musculoskeletal: Positive for arthralgias, back pain and gait problem. Negative for joint swelling and neck pain.  Skin: Negative for rash.  Neurological: Positive for weakness. Negative for dizziness, tremors and speech  difficulty.  Psychiatric/Behavioral: Negative for agitation, dysphoric mood, sleep disturbance and suicidal ideas. The patient is not nervous/anxious.     Objective:  BP (!) 148/84 (BP Location: Left Arm, Patient Position: Sitting, Cuff Size: Normal)    Pulse 89    Temp 98.2 F (36.8 C) (Oral)    Ht 5' 7.5" (1.715 m)    Wt 188 lb (85.3 kg)    SpO2 99%    BMI 29.01 kg/m   BP Readings from Last 3 Encounters:  06/23/18 (!) 148/84  01/16/18 (!) 142/76  12/22/17 140/80    Wt Readings from Last 3 Encounters:  06/23/18 188 lb (85.3 kg)  01/16/18 200 lb (90.7 kg)  12/22/17 200 lb (90.7 kg)    Physical Exam Constitutional:      General: He is not in acute distress.    Appearance: He is well-developed.     Comments: NAD  Eyes:     Conjunctiva/sclera: Conjunctivae normal.     Pupils: Pupils are equal, round, and reactive to light.  Neck:     Musculoskeletal: Normal range of motion.     Thyroid: No thyromegaly.     Vascular: No JVD.  Cardiovascular:     Rate and Rhythm: Normal rate and regular rhythm.     Heart sounds: Normal heart sounds. No murmur. No friction rub. No gallop.   Pulmonary:     Effort: Pulmonary effort is normal. No respiratory distress.     Breath sounds: Normal breath sounds. No wheezing or rales.  Chest:     Chest  wall: No tenderness.  Abdominal:     General: Bowel sounds are normal. There is no distension.     Palpations: Abdomen is soft. There is no mass.     Tenderness: There is no abdominal tenderness. There is no guarding or rebound.  Musculoskeletal: Normal range of motion.        General: No tenderness.  Lymphadenopathy:     Cervical: No cervical adenopathy.  Skin:    General: Skin is warm and dry.     Findings: No rash.  Neurological:     Mental Status: He is alert and oriented to person, place, and time.     Cranial Nerves: No cranial nerve deficit.     Motor: No abnormal muscle tone.     Coordination: Coordination normal.     Gait: Gait  normal.     Deep Tendon Reflexes: Reflexes are normal and symmetric.  Psychiatric:        Behavior: Behavior normal.        Thought Content: Thought content normal.        Judgment: Judgment normal.    LS stiff Somewhat weak thighs Ataxic wide base gait   Lab Results  Component Value Date   WBC 10.2 10/08/2015   HGB 15.7 10/08/2015   HCT 45.3 10/08/2015   PLT 211 10/08/2015   GLUCOSE 202 (H) 12/22/2017   CHOL 122 10/03/2012   TRIG 166.0 (H) 10/03/2012   HDL 29.20 (L) 10/03/2012   LDLDIRECT 78.4 11/02/2011   LDLCALC 60 10/03/2012   ALT 16 08/11/2015   AST 14 08/11/2015   NA 139 12/22/2017   K 4.1 12/22/2017   CL 102 12/22/2017   CREATININE 1.35 12/22/2017   BUN 23 12/22/2017   CO2 26 12/22/2017   TSH 1.53 10/03/2012   PSA 1.48 10/03/2012   INR 1.02 10/08/2015   HGBA1C 7.6 (H) 12/22/2017    Dg Eye Foreign Body  Result Date: 01/13/2018 CLINICAL DATA:  Metal working/exposure; clearance prior to MRI EXAM: ORBITS FOR FOREIGN BODY - 2 VIEW COMPARISON:  None. FINDINGS: No radiopaque foreign body.  Ok to proceed to MRI. IMPRESSION: No radiopaque foreign body.  Ok to proceed to MRI. Electronically Signed   By: Sandi Mariscal M.D.   On: 01/13/2018 09:24   Mr Lumbar Spine Wo Contrast  Result Date: 01/13/2018 CLINICAL DATA:  Low back pain with left leg weakness. EXAM: MRI LUMBAR SPINE WITHOUT CONTRAST TECHNIQUE: Multiplanar, multisequence MR imaging of the lumbar spine was performed. No intravenous contrast was administered. COMPARISON:  None. FINDINGS: Segmentation:  Standard lumbar numbering Alignment: Grade 1 anterolisthesis at L3-4, facet mediated. Mild retrolisthesis at L5-S1 accentuated by endplate ridging Vertebrae:  No fracture, evidence of discitis, or bone lesion. Conus medullaris and cauda equina: Conus extends to the L1 level. Conus and cauda equina appear normal. Paraspinal and other soft tissues: Negative Disc levels: Degenerative changes are superimposed on congenitally  narrow lower lumbar spinal canal from short pedicles. T12- L1: Asymmetric right facet spurring.  No impingement L1-L2: Mild disc narrowing.  Bilateral facet spurring. L2-L3: Mild disc bulging and facet spurring.  No impingement L3-L4: Degenerative facet hypertrophy with slight anterolisthesis. Moderate spinal stenosis. L4-L5: Advanced disc narrowing with bulging and endplate ridging. Degenerative facet hypertrophy. There is moderate bilateral foraminal stenosis. Moderate spinal stenosis. L5-S1:Disc narrowing with endplate ridging and bulging. There is a superimposed central protrusion that is noncompressive. Facet hypertrophy asymmetric to the left where there is L5 foraminal mass-effect on sagittal images. The right foramen is  patent. IMPRESSION: 1. Advanced disc degeneration at L4-5 and below and advanced facet arthropathy at L3-4 and below. 2. Moderate spinal stenosis at L3-4 and L4-5 from degenerative factors and short pedicles. 3. L5-S1 advanced left foraminal impingement. 4. L4-5 moderate bilateral foraminal stenosis. Electronically Signed   By: Monte Fantasia M.D.   On: 01/13/2018 10:32    Assessment & Plan:   There are no diagnoses linked to this encounter.   No orders of the defined types were placed in this encounter.    Follow-up: No follow-ups on file.  Walker Kehr, MD

## 2018-08-28 ENCOUNTER — Other Ambulatory Visit: Payer: Self-pay

## 2018-08-28 NOTE — Patient Outreach (Signed)
[place]) Care Management  08/28/2018  STEVON GOUGH [birth year] [id number]   Medication Adherence call to Mr. Benjaman Artman HIPPA Compliant Voice message left with a call back number. Mr. Crispo is showing past due on Pravastatin 40 mg and Metformin Er 500 mg under [facility].  [place] Management Direct Dial [phone]  Fax [phone] [email or web address]

## 2018-09-25 ENCOUNTER — Ambulatory Visit (INDEPENDENT_AMBULATORY_CARE_PROVIDER_SITE_OTHER): Payer: Medicare Other | Admitting: Internal Medicine

## 2018-09-25 ENCOUNTER — Other Ambulatory Visit (INDEPENDENT_AMBULATORY_CARE_PROVIDER_SITE_OTHER): Payer: Medicare Other

## 2018-09-25 ENCOUNTER — Encounter: Payer: Self-pay | Admitting: Internal Medicine

## 2018-09-25 ENCOUNTER — Other Ambulatory Visit: Payer: Self-pay

## 2018-09-25 VITALS — BP 138/84 | HR 83 | Temp 97.8°F | Ht 67.5 in | Wt 190.0 lb

## 2018-09-25 DIAGNOSIS — I2510 Atherosclerotic heart disease of native coronary artery without angina pectoris: Secondary | ICD-10-CM

## 2018-09-25 DIAGNOSIS — E785 Hyperlipidemia, unspecified: Secondary | ICD-10-CM

## 2018-09-25 DIAGNOSIS — N183 Chronic kidney disease, stage 3 (moderate): Secondary | ICD-10-CM

## 2018-09-25 DIAGNOSIS — I10 Essential (primary) hypertension: Secondary | ICD-10-CM

## 2018-09-25 DIAGNOSIS — E1122 Type 2 diabetes mellitus with diabetic chronic kidney disease: Secondary | ICD-10-CM

## 2018-09-25 DIAGNOSIS — M5440 Lumbago with sciatica, unspecified side: Secondary | ICD-10-CM | POA: Diagnosis not present

## 2018-09-25 DIAGNOSIS — R269 Unspecified abnormalities of gait and mobility: Secondary | ICD-10-CM

## 2018-09-25 DIAGNOSIS — Z23 Encounter for immunization: Secondary | ICD-10-CM | POA: Diagnosis not present

## 2018-09-25 LAB — BASIC METABOLIC PANEL
BUN: 13 mg/dL (ref 6–23)
CO2: 30 mEq/L (ref 19–32)
Calcium: 9.8 mg/dL (ref 8.4–10.5)
Chloride: 100 mEq/L (ref 96–112)
Creatinine, Ser: 1.06 mg/dL (ref 0.40–1.50)
GFR: 66.96 mL/min (ref >60.00)
Glucose, Bld: 131 mg/dL — ABNORMAL HIGH (ref 70–99)
Potassium: 4.2 mEq/L (ref 3.5–5.1)
Sodium: 137 mEq/L (ref 135–145)

## 2018-09-25 LAB — HEMOGLOBIN A1C: Hgb A1c MFr Bld: 6.8 % — ABNORMAL HIGH (ref 4.6–6.5)

## 2018-09-25 NOTE — Assessment & Plan Note (Signed)
Pravastatin  

## 2018-09-25 NOTE — Patient Instructions (Signed)

## 2018-09-25 NOTE — Assessment & Plan Note (Signed)
No hard yard work!

## 2018-09-25 NOTE — Assessment & Plan Note (Signed)
Norvasc

## 2018-09-25 NOTE — Progress Notes (Signed)
Subjective:  Patient ID: Jeff Burgess, male    DOB: [birth year]  Age: 83 y.o. MRN: [id number]  CC: No chief complaint on file.   HPI Jeff Burgess presents for CAD, HTN, DM f/u  Outpatient Medications Prior to Visit  Medication Sig Dispense Refill   amLODipine (NORVASC) 5 MG tablet Take 1 tablet (5 mg total) by mouth daily. 90 tablet 3   aspirin 81 MG tablet Take 2 tablets (162 mg total) by mouth daily. 100 tablet 3   b complex vitamins tablet Take 1 tablet by mouth daily. 100 tablet 3   cholecalciferol (VITAMIN D) 25 MCG (1000 UT) tablet Take 1 tablet (1,000 Units total) by mouth every morning. Follow-up due in April must see provider for refills 90 tablet 0   metFORMIN (GLUCOPHAGE-XR) 500 MG 24 hr tablet TAKE ONE TABLET BY MOUTH TWICE DAILY 180 tablet 3   pravastatin (PRAVACHOL) 40 MG tablet Take 1 tablet (40 mg total) by mouth daily. 90 tablet 3   repaglinide (PRANDIN) 0.5 MG tablet Take 1 tablet (0.5 mg total) by mouth 3 (three) times daily before meals. 270 tablet 3   valsartan-hydrochlorothiazide (DIOVAN-HCT) 320-25 MG tablet TAKE ONE TABLET BY MOUTH ONCE DAILY 90 tablet 2   No facility-administered medications prior to visit.     ROS: Review of Systems  Constitutional: Negative for appetite change, fatigue and unexpected weight change.  HENT: Negative for congestion, nosebleeds, sneezing, sore throat and trouble swallowing.   Eyes: Negative for itching and visual disturbance.  Respiratory: Negative for cough.   Cardiovascular: Negative for chest pain, palpitations and leg swelling.  Gastrointestinal: Negative for abdominal distention, blood in stool, diarrhea and nausea.  Genitourinary: Negative for frequency and hematuria.  Musculoskeletal: Positive for arthralgias. Negative for back pain, gait problem, joint swelling and neck pain.  Skin: Negative for rash.  Neurological: Negative for dizziness, tremors, speech difficulty and weakness.    Psychiatric/Behavioral: Negative for agitation, dysphoric mood, sleep disturbance and suicidal ideas. The patient is not nervous/anxious.     Objective:  BP 138/84 (BP Location: Left Arm, Patient Position: Sitting, Cuff Size: Large)    Pulse 83    Temp 97.8 F (36.6 C) (Oral)    Ht 5' 7.5" (1.715 m)    Wt 190 lb (86.2 kg)    SpO2 97%    BMI 29.32 kg/m   BP Readings from Last 3 Encounters:  09/25/18 138/84  06/23/18 (!) 148/84  01/16/18 (!) 142/76    Wt Readings from Last 3 Encounters:  09/25/18 190 lb (86.2 kg)  06/23/18 188 lb (85.3 kg)  01/16/18 200 lb (90.7 kg)    Physical Exam Constitutional:      General: He is not in acute distress.    Appearance: He is well-developed.     Comments: NAD  Eyes:     Conjunctiva/sclera: Conjunctivae normal.     Pupils: Pupils are equal, round, and reactive to light.  Neck:     Musculoskeletal: Normal range of motion.     Thyroid: No thyromegaly.     Vascular: No JVD.  Cardiovascular:     Rate and Rhythm: Normal rate and regular rhythm.     Heart sounds: Normal heart sounds. No murmur. No friction rub. No gallop.   Pulmonary:     Effort: Pulmonary effort is normal. No respiratory distress.     Breath sounds: Normal breath sounds. No wheezing or rales.  Chest:     Chest wall: No tenderness.  Abdominal:  General: Bowel sounds are normal. There is no distension.     Palpations: Abdomen is soft. There is no mass.     Tenderness: There is no abdominal tenderness. There is no guarding or rebound.  Musculoskeletal: Normal range of motion.        General: No tenderness.  Lymphadenopathy:     Cervical: No cervical adenopathy.  Skin:    General: Skin is warm and dry.     Findings: No rash.  Neurological:     Mental Status: He is alert and oriented to person, place, and time.     Cranial Nerves: No cranial nerve deficit.     Motor: No abnormal muscle tone.     Coordination: Coordination normal.     Gait: Gait normal.     Deep  Tendon Reflexes: Reflexes are normal and symmetric.  Psychiatric:        Behavior: Behavior normal.        Thought Content: Thought content normal.        Judgment: Judgment normal.     Lab Results  Component Value Date   WBC 10.2 10/08/2015   HGB 15.7 10/08/2015   HCT 45.3 10/08/2015   PLT 211 10/08/2015   GLUCOSE 94 06/23/2018   CHOL 122 10/03/2012   TRIG 166.0 (H) 10/03/2012   HDL 29.20 (L) 10/03/2012   LDLDIRECT 78.4 11/02/2011   LDLCALC 60 10/03/2012   ALT 16 08/11/2015   AST 14 08/11/2015   NA 141 06/23/2018   K 4.3 06/23/2018   CL 105 06/23/2018   CREATININE 1.14 06/23/2018   BUN 15 06/23/2018   CO2 29 06/23/2018   TSH 1.53 10/03/2012   PSA 1.48 10/03/2012   INR 1.02 10/08/2015   HGBA1C 6.8 (H) 06/23/2018    Dg Eye Foreign Body  Result Date: 01/13/2018 CLINICAL DATA:  Metal working/exposure; clearance prior to MRI EXAM: ORBITS FOR FOREIGN BODY - 2 VIEW COMPARISON:  None. FINDINGS: No radiopaque foreign body.  Ok to proceed to MRI. IMPRESSION: No radiopaque foreign body.  Ok to proceed to MRI. Electronically Signed   By: Sandi Mariscal M.D.   On: 01/13/2018 09:24   Mr Lumbar Spine Wo Contrast  Result Date: 01/13/2018 CLINICAL DATA:  Low back pain with left leg weakness. EXAM: MRI LUMBAR SPINE WITHOUT CONTRAST TECHNIQUE: Multiplanar, multisequence MR imaging of the lumbar spine was performed. No intravenous contrast was administered. COMPARISON:  None. FINDINGS: Segmentation:  Standard lumbar numbering Alignment: Grade 1 anterolisthesis at L3-4, facet mediated. Mild retrolisthesis at L5-S1 accentuated by endplate ridging Vertebrae:  No fracture, evidence of discitis, or bone lesion. Conus medullaris and cauda equina: Conus extends to the L1 level. Conus and cauda equina appear normal. Paraspinal and other soft tissues: Negative Disc levels: Degenerative changes are superimposed on congenitally narrow lower lumbar spinal canal from short pedicles. T12- L1: Asymmetric right  facet spurring.  No impingement L1-L2: Mild disc narrowing.  Bilateral facet spurring. L2-L3: Mild disc bulging and facet spurring.  No impingement L3-L4: Degenerative facet hypertrophy with slight anterolisthesis. Moderate spinal stenosis. L4-L5: Advanced disc narrowing with bulging and endplate ridging. Degenerative facet hypertrophy. There is moderate bilateral foraminal stenosis. Moderate spinal stenosis. L5-S1:Disc narrowing with endplate ridging and bulging. There is a superimposed central protrusion that is noncompressive. Facet hypertrophy asymmetric to the left where there is L5 foraminal mass-effect on sagittal images. The right foramen is patent. IMPRESSION: 1. Advanced disc degeneration at L4-5 and below and advanced facet arthropathy at L3-4 and below. 2. Moderate  spinal stenosis at L3-4 and L4-5 from degenerative factors and short pedicles. 3. L5-S1 advanced left foraminal impingement. 4. L4-5 moderate bilateral foraminal stenosis. Electronically Signed   By: Monte Fantasia M.D.   On: 01/13/2018 10:32    Assessment & Plan:   There are no diagnoses linked to this encounter.   No orders of the defined types were placed in this encounter.    Follow-up: No follow-ups on file.  Walker Kehr, MD

## 2018-09-25 NOTE — Assessment & Plan Note (Signed)
Metformin if fasting

## 2018-09-25 NOTE — Addendum Note (Signed)
Addended by: Karren Cobble on: 09/25/2018 11:42 AM   Modules accepted: Orders

## 2018-09-25 NOTE — Assessment & Plan Note (Signed)
ASA, Amlodipine, Lovastatin

## 2018-11-30 ENCOUNTER — Other Ambulatory Visit: Payer: Self-pay

## 2018-11-30 NOTE — Patient Outreach (Signed)
[place]) Care Management  11/30/2018  JONQUEZ COURIER Jan 20, 1935 [id number]   Medication Adherence call to Mr. Vergie Bronner HIPPA Compliant Voice message left with a call back number. Mr. Rairdon is showing past due on Valsartan/Hctz 320/25 mg under [place].   [place] Management Direct Dial [phone]  Fax [phone] [email or web address]

## 2018-12-14 ENCOUNTER — Other Ambulatory Visit: Payer: Self-pay

## 2018-12-14 NOTE — Patient Outreach (Signed)
[facility]) Care Management  12/14/2018  [name] [birth year] [id number]   Medication Adherence call to [name] HIPPA Compliant Voice message left with a call back number. Jeff Burgess is showing past due on Valsartan/Hctz 320/25 mg under [place].   [place] Management Direct Dial [phone]  Fax [phone] [email or web address]

## 2018-12-25 ENCOUNTER — Ambulatory Visit (INDEPENDENT_AMBULATORY_CARE_PROVIDER_SITE_OTHER): Payer: Medicare Other | Admitting: Internal Medicine

## 2018-12-25 ENCOUNTER — Encounter: Payer: Self-pay | Admitting: Internal Medicine

## 2018-12-25 ENCOUNTER — Other Ambulatory Visit: Payer: Self-pay

## 2018-12-25 ENCOUNTER — Other Ambulatory Visit (INDEPENDENT_AMBULATORY_CARE_PROVIDER_SITE_OTHER): Payer: Medicare Other

## 2018-12-25 DIAGNOSIS — E1122 Type 2 diabetes mellitus with diabetic chronic kidney disease: Secondary | ICD-10-CM | POA: Diagnosis not present

## 2018-12-25 DIAGNOSIS — I2510 Atherosclerotic heart disease of native coronary artery without angina pectoris: Secondary | ICD-10-CM

## 2018-12-25 DIAGNOSIS — I10 Essential (primary) hypertension: Secondary | ICD-10-CM

## 2018-12-25 DIAGNOSIS — E785 Hyperlipidemia, unspecified: Secondary | ICD-10-CM | POA: Diagnosis not present

## 2018-12-25 DIAGNOSIS — M5440 Lumbago with sciatica, unspecified side: Secondary | ICD-10-CM | POA: Diagnosis not present

## 2018-12-25 DIAGNOSIS — E1121 Type 2 diabetes mellitus with diabetic nephropathy: Secondary | ICD-10-CM

## 2018-12-25 DIAGNOSIS — N1831 Chronic kidney disease, stage 3a: Secondary | ICD-10-CM | POA: Diagnosis not present

## 2018-12-25 LAB — BASIC METABOLIC PANEL
BUN: 16 mg/dL (ref 6–23)
CO2: 28 mEq/L (ref 19–32)
Calcium: 9.7 mg/dL (ref 8.4–10.5)
Chloride: 103 mEq/L (ref 96–112)
Creatinine, Ser: 1.09 mg/dL (ref 0.40–1.50)
GFR: 64.8 mL/min (ref >60.00)
Glucose, Bld: 161 mg/dL — ABNORMAL HIGH (ref 70–99)
Potassium: 4.3 mEq/L (ref 3.5–5.1)
Sodium: 140 mEq/L (ref 135–145)

## 2018-12-25 LAB — HEMOGLOBIN A1C: Hgb A1c MFr Bld: 6.8 % — ABNORMAL HIGH (ref 4.6–6.5)

## 2018-12-25 NOTE — Assessment & Plan Note (Signed)
Labs

## 2018-12-25 NOTE — Assessment & Plan Note (Signed)
Diovan-HCT 

## 2018-12-25 NOTE — Patient Instructions (Signed)
If you have medicare related insurance (such as traditional Medicare, Blue Cross Medicare, United HealthCare Medicare, or similar), Please make an appointment at the scheduling desk with Jill, the Wellness Health Coach, for your Wellness visit in this office, which is a benefit with your insurance.  

## 2018-12-25 NOTE — Assessment & Plan Note (Signed)
ASA, Amlodipine, Lovastatin

## 2018-12-25 NOTE — Progress Notes (Signed)
Subjective:  Patient ID: Jeff Burgess, male    DOB: Aug 22, 1935  Age: 83 y.o. MRN: [id number]  CC: No chief complaint on file.   HPI JANDEL ECKMAN presents for HTN, CAD, dyslipidemia f/u  Outpatient Medications Prior to Visit  Medication Sig Dispense Refill  . amLODipine (NORVASC) 5 MG tablet Take 1 tablet (5 mg total) by mouth daily. 90 tablet 3  . aspirin 81 MG tablet Take 2 tablets (162 mg total) by mouth daily. 100 tablet 3  . b complex vitamins tablet Take 1 tablet by mouth daily. 100 tablet 3  . cholecalciferol (VITAMIN D) 25 MCG (1000 UT) tablet Take 1 tablet (1,000 Units total) by mouth every morning. Follow-up due in April must see provider for refills 90 tablet 0  . metFORMIN (GLUCOPHAGE-XR) 500 MG 24 hr tablet TAKE ONE TABLET BY MOUTH TWICE DAILY 180 tablet 3  . pravastatin (PRAVACHOL) 40 MG tablet Take 1 tablet (40 mg total) by mouth daily. 90 tablet 3  . valsartan-hydrochlorothiazide (DIOVAN-HCT) 320-25 MG tablet TAKE ONE TABLET BY MOUTH ONCE DAILY 90 tablet 2  . repaglinide (PRANDIN) 0.5 MG tablet Take 1 tablet (0.5 mg total) by mouth 3 (three) times daily before meals. (Patient not taking: Reported on 12/25/2018) 270 tablet 3   No facility-administered medications prior to visit.    ROS: Review of Systems  Constitutional: Positive for fatigue. Negative for appetite change and unexpected weight change.  HENT: Negative for congestion, nosebleeds, sneezing, sore throat and trouble swallowing.   Eyes: Negative for itching and visual disturbance.  Respiratory: Negative for cough.   Cardiovascular: Negative for chest pain, palpitations and leg swelling.  Gastrointestinal: Negative for abdominal distention, blood in stool, diarrhea and nausea.  Genitourinary: Negative for frequency and hematuria.  Musculoskeletal: Positive for arthralgias, back pain and gait problem. Negative for joint swelling and neck pain.  Skin: Negative for rash.  Neurological: Negative for  dizziness, tremors, speech difficulty and weakness.  Psychiatric/Behavioral: Negative for agitation, dysphoric mood and sleep disturbance. The patient is not nervous/anxious.     Objective:  BP (!) 150/78 (BP Location: Left Arm, Patient Position: Sitting, Cuff Size: Normal)   Pulse 80   Temp 98.4 F (36.9 C) (Oral)   Ht 5' 7.5" (1.715 m)   Wt 190 lb (86.2 kg)   SpO2 96%   BMI 29.32 kg/m   BP Readings from Last 3 Encounters:  12/25/18 (!) 150/78  09/25/18 138/84  06/23/18 (!) 148/84    Wt Readings from Last 3 Encounters:  12/25/18 190 lb (86.2 kg)  09/25/18 190 lb (86.2 kg)  06/23/18 188 lb (85.3 kg)    Physical Exam Constitutional:      General: He is not in acute distress.    Appearance: He is well-developed.     Comments: NAD  Eyes:     Conjunctiva/sclera: Conjunctivae normal.     Pupils: Pupils are equal, round, and reactive to light.  Neck:     Thyroid: No thyromegaly.     Vascular: No JVD.  Cardiovascular:     Rate and Rhythm: Normal rate and regular rhythm.     Heart sounds: Normal heart sounds. No murmur. No friction rub. No gallop.   Pulmonary:     Effort: Pulmonary effort is normal. No respiratory distress.     Breath sounds: Normal breath sounds. No wheezing or rales.  Chest:     Chest wall: No tenderness.  Abdominal:     General: Bowel sounds are normal. There  is no distension.     Palpations: Abdomen is soft. There is no mass.     Tenderness: There is no abdominal tenderness. There is no guarding or rebound.  Musculoskeletal:        General: Tenderness present. Normal range of motion.     Cervical back: Normal range of motion.  Lymphadenopathy:     Cervical: No cervical adenopathy.  Skin:    General: Skin is warm and dry.     Findings: No rash.  Neurological:     Mental Status: He is alert and oriented to person, place, and time.     Cranial Nerves: No cranial nerve deficit.     Motor: No abnormal muscle tone.     Coordination: Coordination  normal.     Gait: Gait normal.     Deep Tendon Reflexes: Reflexes are normal and symmetric.  Psychiatric:        Behavior: Behavior normal.        Thought Content: Thought content normal.        Judgment: Judgment normal.    LS, knees - stiff   Lab Results  Component Value Date   WBC 10.2 10/08/2015   HGB 15.7 10/08/2015   HCT 45.3 10/08/2015   PLT 211 10/08/2015   GLUCOSE 131 (H) 09/25/2018   CHOL 122 10/03/2012   TRIG 166.0 (H) 10/03/2012   HDL 29.20 (L) 10/03/2012   LDLDIRECT 78.4 11/02/2011   LDLCALC 60 10/03/2012   ALT 16 08/11/2015   AST 14 08/11/2015   NA 137 09/25/2018   K 4.2 09/25/2018   CL 100 09/25/2018   CREATININE 1.06 09/25/2018   BUN 13 09/25/2018   CO2 30 09/25/2018   TSH 1.53 10/03/2012   PSA 1.48 10/03/2012   INR 1.02 10/08/2015   HGBA1C 6.8 (H) 09/25/2018    DG Eye Foreign Body  Result Date: 01/13/2018 CLINICAL DATA:  Metal working/exposure; clearance prior to MRI EXAM: ORBITS FOR FOREIGN BODY - 2 VIEW COMPARISON:  None. FINDINGS: No radiopaque foreign body.  Ok to proceed to MRI. IMPRESSION: No radiopaque foreign body.  Ok to proceed to MRI. Electronically Signed   By: Sandi Mariscal M.D.   On: 01/13/2018 09:24   MR Lumbar Spine Wo Contrast  Result Date: 01/13/2018 CLINICAL DATA:  Low back pain with left leg weakness. EXAM: MRI LUMBAR SPINE WITHOUT CONTRAST TECHNIQUE: Multiplanar, multisequence MR imaging of the lumbar spine was performed. No intravenous contrast was administered. COMPARISON:  None. FINDINGS: Segmentation:  Standard lumbar numbering Alignment: Grade 1 anterolisthesis at L3-4, facet mediated. Mild retrolisthesis at L5-S1 accentuated by endplate ridging Vertebrae:  No fracture, evidence of discitis, or bone lesion. Conus medullaris and cauda equina: Conus extends to the L1 level. Conus and cauda equina appear normal. Paraspinal and other soft tissues: Negative Disc levels: Degenerative changes are superimposed on congenitally narrow lower  lumbar spinal canal from short pedicles. T12- L1: Asymmetric right facet spurring.  No impingement L1-L2: Mild disc narrowing.  Bilateral facet spurring. L2-L3: Mild disc bulging and facet spurring.  No impingement L3-L4: Degenerative facet hypertrophy with slight anterolisthesis. Moderate spinal stenosis. L4-L5: Advanced disc narrowing with bulging and endplate ridging. Degenerative facet hypertrophy. There is moderate bilateral foraminal stenosis. Moderate spinal stenosis. L5-S1:Disc narrowing with endplate ridging and bulging. There is a superimposed central protrusion that is noncompressive. Facet hypertrophy asymmetric to the left where there is L5 foraminal mass-effect on sagittal images. The right foramen is patent. IMPRESSION: 1. Advanced disc degeneration at L4-5 and below  and advanced facet arthropathy at L3-4 and below. 2. Moderate spinal stenosis at L3-4 and L4-5 from degenerative factors and short pedicles. 3. L5-S1 advanced left foraminal impingement. 4. L4-5 moderate bilateral foraminal stenosis. Electronically Signed   By: Monte Fantasia M.D.   On: 01/13/2018 10:32    Assessment & Plan:   There are no diagnoses linked to this encounter.   No orders of the defined types were placed in this encounter.    Follow-up: No follow-ups on file.  Walker Kehr, MD

## 2018-12-30 ENCOUNTER — Encounter: Payer: Self-pay | Admitting: Internal Medicine

## 2019-02-01 ENCOUNTER — Other Ambulatory Visit: Payer: Self-pay | Admitting: Internal Medicine

## 2019-02-14 DIAGNOSIS — I10 Essential (primary) hypertension: Secondary | ICD-10-CM | POA: Diagnosis not present

## 2019-02-14 DIAGNOSIS — M48061 Spinal stenosis, lumbar region without neurogenic claudication: Secondary | ICD-10-CM | POA: Diagnosis not present

## 2019-02-14 DIAGNOSIS — M545 Low back pain: Secondary | ICD-10-CM | POA: Diagnosis not present

## 2019-02-14 DIAGNOSIS — M5416 Radiculopathy, lumbar region: Secondary | ICD-10-CM | POA: Diagnosis not present

## 2019-02-14 DIAGNOSIS — M5126 Other intervertebral disc displacement, lumbar region: Secondary | ICD-10-CM | POA: Diagnosis not present

## 2019-03-22 DIAGNOSIS — M48061 Spinal stenosis, lumbar region without neurogenic claudication: Secondary | ICD-10-CM | POA: Diagnosis not present

## 2019-03-26 ENCOUNTER — Ambulatory Visit (INDEPENDENT_AMBULATORY_CARE_PROVIDER_SITE_OTHER): Payer: Medicare Other | Admitting: Internal Medicine

## 2019-03-26 ENCOUNTER — Other Ambulatory Visit: Payer: Self-pay

## 2019-03-26 ENCOUNTER — Encounter: Payer: Self-pay | Admitting: Internal Medicine

## 2019-03-26 DIAGNOSIS — E785 Hyperlipidemia, unspecified: Secondary | ICD-10-CM | POA: Diagnosis not present

## 2019-03-26 DIAGNOSIS — E1122 Type 2 diabetes mellitus with diabetic chronic kidney disease: Secondary | ICD-10-CM | POA: Diagnosis not present

## 2019-03-26 DIAGNOSIS — I10 Essential (primary) hypertension: Secondary | ICD-10-CM

## 2019-03-26 DIAGNOSIS — M5440 Lumbago with sciatica, unspecified side: Secondary | ICD-10-CM | POA: Diagnosis not present

## 2019-03-26 DIAGNOSIS — R5383 Other fatigue: Secondary | ICD-10-CM | POA: Diagnosis not present

## 2019-03-26 DIAGNOSIS — E1121 Type 2 diabetes mellitus with diabetic nephropathy: Secondary | ICD-10-CM

## 2019-03-26 DIAGNOSIS — N1831 Chronic kidney disease, stage 3a: Secondary | ICD-10-CM

## 2019-03-26 LAB — HEMOGLOBIN A1C: Hgb A1c MFr Bld: 6.9 % — ABNORMAL HIGH (ref 4.6–6.5)

## 2019-03-26 LAB — BASIC METABOLIC PANEL
BUN: 28 mg/dL — ABNORMAL HIGH (ref 6–23)
CO2: 27 mEq/L (ref 19–32)
Calcium: 9.9 mg/dL (ref 8.4–10.5)
Chloride: 103 mEq/L (ref 96–112)
Creatinine, Ser: 1.12 mg/dL (ref 0.40–1.50)
GFR: 62.76 mL/min (ref >60.00)
Glucose, Bld: 178 mg/dL — ABNORMAL HIGH (ref 70–99)
Potassium: 3.9 mEq/L (ref 3.5–5.1)
Sodium: 138 mEq/L (ref 135–145)

## 2019-03-26 MED ORDER — AMLODIPINE BESYLATE 2.5 MG PO TABS: 2.5000 mg | ORAL_TABLET | Freq: Every day | ORAL | 3 refills | Status: DC

## 2019-03-26 MED ORDER — TELMISARTAN-HCTZ 80-25 MG PO TABS: 1.0000 | ORAL_TABLET | Freq: Every day | ORAL | 3 refills | Status: DC

## 2019-03-26 NOTE — Progress Notes (Signed)
Subjective:  Patient ID: Jeff Burgess, male    DOB: 02/17/1935  Age: 84 y.o. MRN: [id number]  CC: No chief complaint on file.   HPI Jeff Burgess presents for HTN, CAD, dyslipidemia BP has been high - not taking Valsartan - it was n/a Epidural helped w/leg pain  Outpatient Medications Prior to Visit  Medication Sig Dispense Refill  . aspirin 81 MG tablet Take 2 tablets (162 mg total) by mouth daily. 100 tablet 3  . b complex vitamins tablet Take 1 tablet by mouth daily. 100 tablet 3  . cholecalciferol (VITAMIN D) 25 MCG (1000 UT) tablet Take 1 tablet (1,000 Units total) by mouth every morning. Follow-up due in April must see provider for refills 90 tablet 0  . metFORMIN (GLUCOPHAGE-XR) 500 MG 24 hr tablet TAKE 1 TABLET BY MOUTH TWO  TIMES DAILY 180 tablet 3  . amLODipine (NORVASC) 5 MG tablet Take 1 tablet (5 mg total) by mouth daily. 90 tablet 3  . pravastatin (PRAVACHOL) 40 MG tablet Take 1 tablet (40 mg total) by mouth daily. (Patient not taking: Reported on 03/26/2019) 90 tablet 3  . valsartan-hydrochlorothiazide (DIOVAN-HCT) 320-25 MG tablet TAKE ONE TABLET BY MOUTH ONCE DAILY (Patient not taking: Reported on 03/26/2019) 90 tablet 2  . repaglinide (PRANDIN) 0.5 MG tablet Take 1 tablet (0.5 mg total) by mouth 3 (three) times daily before meals. (Patient not taking: Reported on 03/26/2019) 270 tablet 3   No facility-administered medications prior to visit.    ROS: Review of Systems  Constitutional: Positive for unexpected weight change. Negative for appetite change and fatigue.  HENT: Negative for congestion, nosebleeds, sneezing, sore throat and trouble swallowing.   Eyes: Negative for itching and visual disturbance.  Respiratory: Negative for cough.   Cardiovascular: Negative for chest pain, palpitations and leg swelling.  Gastrointestinal: Negative for abdominal distention, blood in stool, diarrhea and nausea.  Genitourinary: Negative for frequency and hematuria.    Musculoskeletal: Positive for back pain and gait problem. Negative for joint swelling and neck pain.  Skin: Negative for rash.  Neurological: Negative for dizziness, tremors, speech difficulty and weakness.  Psychiatric/Behavioral: Negative for agitation, dysphoric mood and sleep disturbance. The patient is not nervous/anxious.     Objective:  BP (!) 160/76 (BP Location: Left Arm, Patient Position: Sitting, Cuff Size: Large)   Pulse 71   Temp 98.1 F (36.7 C) (Oral)   Ht 5' 7.5" (1.715 m)   Wt 193 lb (87.5 kg)   SpO2 95%   BMI 29.78 kg/m   BP Readings from Last 3 Encounters:  03/26/19 (!) 160/76  12/25/18 (!) 150/78  09/25/18 138/84    Wt Readings from Last 3 Encounters:  03/26/19 193 lb (87.5 kg)  12/25/18 190 lb (86.2 kg)  09/25/18 190 lb (86.2 kg)    Physical Exam Constitutional:      General: He is not in acute distress.    Appearance: He is well-developed.     Comments: NAD  Eyes:     Conjunctiva/sclera: Conjunctivae normal.     Pupils: Pupils are equal, round, and reactive to light.  Neck:     Thyroid: No thyromegaly.     Vascular: No JVD.  Cardiovascular:     Rate and Rhythm: Normal rate and regular rhythm.     Heart sounds: Normal heart sounds. No murmur. No friction rub. No gallop.   Pulmonary:     Effort: Pulmonary effort is normal. No respiratory distress.     Breath sounds:  Normal breath sounds. No wheezing or rales.  Chest:     Chest wall: No tenderness.  Abdominal:     General: Bowel sounds are normal. There is no distension.     Palpations: Abdomen is soft. There is no mass.     Tenderness: There is no abdominal tenderness. There is no guarding or rebound.  Musculoskeletal:        General: Tenderness present. Normal range of motion.     Cervical back: Normal range of motion.  Lymphadenopathy:     Cervical: No cervical adenopathy.  Skin:    General: Skin is warm and dry.     Findings: No rash.  Neurological:     Mental Status: He is alert  and oriented to person, place, and time.     Cranial Nerves: No cranial nerve deficit.     Motor: No abnormal muscle tone.     Coordination: Coordination normal.     Gait: Gait abnormal.     Deep Tendon Reflexes: Reflexes are normal and symmetric.  Psychiatric:        Behavior: Behavior normal.        Thought Content: Thought content normal.        Judgment: Judgment normal.     Lab Results  Component Value Date   WBC 10.2 10/08/2015   HGB 15.7 10/08/2015   HCT 45.3 10/08/2015   PLT 211 10/08/2015   GLUCOSE 161 (H) 12/25/2018   CHOL 122 10/03/2012   TRIG 166.0 (H) 10/03/2012   HDL 29.20 (L) 10/03/2012   LDLDIRECT 78.4 11/02/2011   LDLCALC 60 10/03/2012   ALT 16 08/11/2015   AST 14 08/11/2015   NA 140 12/25/2018   K 4.3 12/25/2018   CL 103 12/25/2018   CREATININE 1.09 12/25/2018   BUN 16 12/25/2018   CO2 28 12/25/2018   TSH 1.53 10/03/2012   PSA 1.48 10/03/2012   INR 1.02 10/08/2015   HGBA1C 6.8 (H) 12/25/2018    DG Eye Foreign Body  Result Date: 01/13/2018 CLINICAL DATA:  Metal working/exposure; clearance prior to MRI EXAM: ORBITS FOR FOREIGN BODY - 2 VIEW COMPARISON:  None. FINDINGS: No radiopaque foreign body.  Ok to proceed to MRI. IMPRESSION: No radiopaque foreign body.  Ok to proceed to MRI. Electronically Signed   By: Sandi Mariscal M.D.   On: 01/13/2018 09:24   MR Lumbar Spine Wo Contrast  Result Date: 01/13/2018 CLINICAL DATA:  Low back pain with left leg weakness. EXAM: MRI LUMBAR SPINE WITHOUT CONTRAST TECHNIQUE: Multiplanar, multisequence MR imaging of the lumbar spine was performed. No intravenous contrast was administered. COMPARISON:  None. FINDINGS: Segmentation:  Standard lumbar numbering Alignment: Grade 1 anterolisthesis at L3-4, facet mediated. Mild retrolisthesis at L5-S1 accentuated by endplate ridging Vertebrae:  No fracture, evidence of discitis, or bone lesion. Conus medullaris and cauda equina: Conus extends to the L1 level. Conus and cauda equina  appear normal. Paraspinal and other soft tissues: Negative Disc levels: Degenerative changes are superimposed on congenitally narrow lower lumbar spinal canal from short pedicles. T12- L1: Asymmetric right facet spurring.  No impingement L1-L2: Mild disc narrowing.  Bilateral facet spurring. L2-L3: Mild disc bulging and facet spurring.  No impingement L3-L4: Degenerative facet hypertrophy with slight anterolisthesis. Moderate spinal stenosis. L4-L5: Advanced disc narrowing with bulging and endplate ridging. Degenerative facet hypertrophy. There is moderate bilateral foraminal stenosis. Moderate spinal stenosis. L5-S1:Disc narrowing with endplate ridging and bulging. There is a superimposed central protrusion that is noncompressive. Facet hypertrophy asymmetric to the  left where there is L5 foraminal mass-effect on sagittal images. The right foramen is patent. IMPRESSION: 1. Advanced disc degeneration at L4-5 and below and advanced facet arthropathy at L3-4 and below. 2. Moderate spinal stenosis at L3-4 and L4-5 from degenerative factors and short pedicles. 3. L5-S1 advanced left foraminal impingement. 4. L4-5 moderate bilateral foraminal stenosis. Electronically Signed   By: Monte Fantasia M.D.   On: 01/13/2018 10:32    Assessment & Plan:    Walker Kehr, MD

## 2019-03-26 NOTE — Patient Instructions (Signed)
Hold Pravastatin x 1-2 weeks Take CoQ10 as directed

## 2019-03-26 NOTE — Assessment & Plan Note (Signed)
Telmisartan HCT, Norvasc 2.5

## 2019-03-26 NOTE — Assessment & Plan Note (Signed)
On Metformin 

## 2019-03-26 NOTE — Assessment & Plan Note (Addendum)
Hold Pravastatin x 1-2 week Take CoQ10 as directed

## 2019-04-11 DIAGNOSIS — M545 Low back pain: Secondary | ICD-10-CM | POA: Diagnosis not present

## 2019-04-11 DIAGNOSIS — M5126 Other intervertebral disc displacement, lumbar region: Secondary | ICD-10-CM | POA: Diagnosis not present

## 2019-04-11 DIAGNOSIS — M5416 Radiculopathy, lumbar region: Secondary | ICD-10-CM | POA: Diagnosis not present

## 2019-04-11 DIAGNOSIS — I10 Essential (primary) hypertension: Secondary | ICD-10-CM | POA: Diagnosis not present

## 2019-04-24 ENCOUNTER — Ambulatory Visit: Payer: Medicare Other | Admitting: Internal Medicine

## 2019-05-07 DIAGNOSIS — I10 Essential (primary) hypertension: Secondary | ICD-10-CM | POA: Diagnosis not present

## 2019-05-07 DIAGNOSIS — M21372 Foot drop, left foot: Secondary | ICD-10-CM | POA: Diagnosis not present

## 2019-05-07 DIAGNOSIS — E232 Diabetes insipidus: Secondary | ICD-10-CM | POA: Diagnosis not present

## 2019-05-14 ENCOUNTER — Other Ambulatory Visit: Payer: Self-pay | Admitting: Neurosurgery

## 2019-05-14 ENCOUNTER — Telehealth: Payer: Self-pay | Admitting: Internal Medicine

## 2019-05-14 ENCOUNTER — Encounter: Payer: Self-pay | Admitting: Internal Medicine

## 2019-05-14 ENCOUNTER — Ambulatory Visit (INDEPENDENT_AMBULATORY_CARE_PROVIDER_SITE_OTHER): Payer: Medicare Other | Admitting: Internal Medicine

## 2019-05-14 ENCOUNTER — Other Ambulatory Visit: Payer: Self-pay

## 2019-05-14 DIAGNOSIS — M5440 Lumbago with sciatica, unspecified side: Secondary | ICD-10-CM | POA: Diagnosis not present

## 2019-05-14 DIAGNOSIS — E785 Hyperlipidemia, unspecified: Secondary | ICD-10-CM | POA: Diagnosis not present

## 2019-05-14 DIAGNOSIS — E1121 Type 2 diabetes mellitus with diabetic nephropathy: Secondary | ICD-10-CM | POA: Diagnosis not present

## 2019-05-14 DIAGNOSIS — M21372 Foot drop, left foot: Secondary | ICD-10-CM

## 2019-05-14 DIAGNOSIS — N1831 Chronic kidney disease, stage 3a: Secondary | ICD-10-CM

## 2019-05-14 DIAGNOSIS — R270 Ataxia, unspecified: Secondary | ICD-10-CM

## 2019-05-14 DIAGNOSIS — G8929 Other chronic pain: Secondary | ICD-10-CM

## 2019-05-14 DIAGNOSIS — I10 Essential (primary) hypertension: Secondary | ICD-10-CM

## 2019-05-14 MED ORDER — AMLODIPINE BESYLATE 5 MG PO TABS: 5.0000 mg | ORAL_TABLET | Freq: Every day | ORAL | 3 refills | Status: DC

## 2019-05-14 NOTE — Assessment & Plan Note (Signed)
Pravastatin, CoQ10

## 2019-05-14 NOTE — Assessment & Plan Note (Signed)
New.  MRI pending Use a walker F/u w/Dr Vertell Limber w/MRI

## 2019-05-14 NOTE — Assessment & Plan Note (Signed)
Appt w/Dr Vertell Limber MRI is pending New L foot drop

## 2019-05-14 NOTE — Telephone Encounter (Signed)
New Message:   Pt is calling and states he needs to know some places that he can take his prescription for a walker to. He states he called [facility] but they don't provide walkers. Please advise.

## 2019-05-14 NOTE — Assessment & Plan Note (Signed)
Not well controlled Increase Amlodipine to 5 mg/d

## 2019-05-14 NOTE — Progress Notes (Signed)
Subjective:  Patient ID: Jeff Burgess, male    DOB: 1935/07/06  Age: 84 y.o. MRN: [id number]  CC: No chief complaint on file.   HPI Jeff Burgess presents for leg weakness - pt fell once, CAD, dyslipidemia, HTN  C/o L foot drop since the fall. Holding Pravastatin did not help  Outpatient Medications Prior to Visit  Medication Sig Dispense Refill  . amLODipine (NORVASC) 2.5 MG tablet Take 1 tablet (2.5 mg total) by mouth daily. 90 tablet 3  . aspirin 81 MG tablet Take 2 tablets (162 mg total) by mouth daily. 100 tablet 3  . b complex vitamins tablet Take 1 tablet by mouth daily. 100 tablet 3  . cholecalciferol (VITAMIN D) 25 MCG (1000 UT) tablet Take 1 tablet (1,000 Units total) by mouth every morning. Follow-up due in April must see provider for refills 90 tablet 0  . metFORMIN (GLUCOPHAGE-XR) 500 MG 24 hr tablet TAKE 1 TABLET BY MOUTH TWO  TIMES DAILY 180 tablet 3  . pravastatin (PRAVACHOL) 40 MG tablet Take 1 tablet (40 mg total) by mouth daily. 90 tablet 3  . telmisartan-hydrochlorothiazide (MICARDIS HCT) 80-25 MG tablet Take 1 tablet by mouth daily. 90 tablet 3   No facility-administered medications prior to visit.    ROS: Review of Systems  Constitutional: Positive for fatigue. Negative for appetite change and unexpected weight change.  HENT: Negative for congestion, nosebleeds, sneezing, sore throat and trouble swallowing.   Eyes: Negative for itching and visual disturbance.  Respiratory: Negative for cough.   Cardiovascular: Negative for chest pain, palpitations and leg swelling.  Gastrointestinal: Negative for abdominal distention, blood in stool, diarrhea and nausea.  Genitourinary: Negative for frequency and hematuria.  Musculoskeletal: Positive for arthralgias, back pain and gait problem. Negative for joint swelling and neck pain.  Skin: Negative for rash.  Neurological: Negative for dizziness, tremors, speech difficulty and weakness.  Psychiatric/Behavioral:  Negative for agitation, dysphoric mood and sleep disturbance. The patient is not nervous/anxious.     Objective:  BP (!) 172/86 (BP Location: Left Arm, Patient Position: Sitting, Cuff Size: Large)   Pulse (!) 101   Temp 98 F (36.7 C) (Oral)   Ht 5' 7.5" (1.715 m)   Wt 186 lb (84.4 kg)   SpO2 96%   BMI 28.70 kg/m   BP Readings from Last 3 Encounters:  05/14/19 (!) 172/86  03/26/19 (!) 160/76  12/25/18 (!) 150/78    Wt Readings from Last 3 Encounters:  05/14/19 186 lb (84.4 kg)  03/26/19 193 lb (87.5 kg)  12/25/18 190 lb (86.2 kg)    Physical Exam Constitutional:      General: He is not in acute distress.    Appearance: He is well-developed.     Comments: NAD  Eyes:     Conjunctiva/sclera: Conjunctivae normal.     Pupils: Pupils are equal, round, and reactive to light.  Neck:     Thyroid: No thyromegaly.     Vascular: No JVD.  Cardiovascular:     Rate and Rhythm: Normal rate and regular rhythm.     Heart sounds: Normal heart sounds. No murmur. No friction rub. No gallop.   Pulmonary:     Effort: Pulmonary effort is normal. No respiratory distress.     Breath sounds: Normal breath sounds. No wheezing or rales.  Chest:     Chest wall: No tenderness.  Abdominal:     General: Bowel sounds are normal. There is no distension.     Palpations:  Abdomen is soft. There is no mass.     Tenderness: There is no abdominal tenderness. There is no guarding or rebound.  Musculoskeletal:        General: Tenderness present. Normal range of motion.     Cervical back: Normal range of motion.  Lymphadenopathy:     Cervical: No cervical adenopathy.  Skin:    General: Skin is warm and dry.     Findings: No rash.  Neurological:     Mental Status: He is alert and oriented to person, place, and time.     Cranial Nerves: No cranial nerve deficit.     Motor: Weakness present. No abnormal muscle tone.     Coordination: Coordination normal.     Gait: Gait abnormal.     Deep Tendon  Reflexes: Reflexes are normal and symmetric.  Psychiatric:        Behavior: Behavior normal.        Thought Content: Thought content normal.        Judgment: Judgment normal.   Cane L foot drop, ataxic   Lab Results  Component Value Date   WBC 10.2 10/08/2015   HGB 15.7 10/08/2015   HCT 45.3 10/08/2015   PLT 211 10/08/2015   GLUCOSE 178 (H) 03/26/2019   CHOL 122 10/03/2012   TRIG 166.0 (H) 10/03/2012   HDL 29.20 (L) 10/03/2012   LDLDIRECT 78.4 11/02/2011   LDLCALC 60 10/03/2012   ALT 16 08/11/2015   AST 14 08/11/2015   NA 138 03/26/2019   K 3.9 03/26/2019   CL 103 03/26/2019   CREATININE 1.12 03/26/2019   BUN 28 (H) 03/26/2019   CO2 27 03/26/2019   TSH 1.53 10/03/2012   PSA 1.48 10/03/2012   INR 1.02 10/08/2015   HGBA1C 6.9 (H) 03/26/2019    DG Eye Foreign Body  Result Date: 01/13/2018 CLINICAL DATA:  Metal working/exposure; clearance prior to MRI EXAM: ORBITS FOR FOREIGN BODY - 2 VIEW COMPARISON:  None. FINDINGS: No radiopaque foreign body.  Ok to proceed to MRI. IMPRESSION: No radiopaque foreign body.  Ok to proceed to MRI. Electronically Signed   By: Sandi Mariscal M.D.   On: 01/13/2018 09:24   MR Lumbar Spine Wo Contrast  Result Date: 01/13/2018 CLINICAL DATA:  Low back pain with left leg weakness. EXAM: MRI LUMBAR SPINE WITHOUT CONTRAST TECHNIQUE: Multiplanar, multisequence MR imaging of the lumbar spine was performed. No intravenous contrast was administered. COMPARISON:  None. FINDINGS: Segmentation:  Standard lumbar numbering Alignment: Grade 1 anterolisthesis at L3-4, facet mediated. Mild retrolisthesis at L5-S1 accentuated by endplate ridging Vertebrae:  No fracture, evidence of discitis, or bone lesion. Conus medullaris and cauda equina: Conus extends to the L1 level. Conus and cauda equina appear normal. Paraspinal and other soft tissues: Negative Disc levels: Degenerative changes are superimposed on congenitally narrow lower lumbar spinal canal from short pedicles.  T12- L1: Asymmetric right facet spurring.  No impingement L1-L2: Mild disc narrowing.  Bilateral facet spurring. L2-L3: Mild disc bulging and facet spurring.  No impingement L3-L4: Degenerative facet hypertrophy with slight anterolisthesis. Moderate spinal stenosis. L4-L5: Advanced disc narrowing with bulging and endplate ridging. Degenerative facet hypertrophy. There is moderate bilateral foraminal stenosis. Moderate spinal stenosis. L5-S1:Disc narrowing with endplate ridging and bulging. There is a superimposed central protrusion that is noncompressive. Facet hypertrophy asymmetric to the left where there is L5 foraminal mass-effect on sagittal images. The right foramen is patent. IMPRESSION: 1. Advanced disc degeneration at L4-5 and below and advanced facet arthropathy at  L3-4 and below. 2. Moderate spinal stenosis at L3-4 and L4-5 from degenerative factors and short pedicles. 3. L5-S1 advanced left foraminal impingement. 4. L4-5 moderate bilateral foraminal stenosis. Electronically Signed   By: Monte Fantasia M.D.   On: 01/13/2018 10:32    Assessment & Plan:    Walker Kehr, MD

## 2019-05-14 NOTE — Assessment & Plan Note (Signed)
Cont on meds

## 2019-05-14 NOTE — Telephone Encounter (Signed)
Pt notified he can go to any medical supply store

## 2019-05-29 ENCOUNTER — Other Ambulatory Visit: Payer: Self-pay

## 2019-05-29 ENCOUNTER — Ambulatory Visit
Admission: RE | Admit: 2019-05-29 | Discharge: 2019-05-29 | Disposition: A | Discharge status: Home / Self Care | Payer: Medicare Other | Source: Ambulatory Visit | Attending: Neurosurgery | Admitting: Neurosurgery

## 2019-05-29 DIAGNOSIS — M21372 Foot drop, left foot: Secondary | ICD-10-CM

## 2019-05-29 DIAGNOSIS — M48061 Spinal stenosis, lumbar region without neurogenic claudication: Secondary | ICD-10-CM | POA: Diagnosis not present

## 2019-06-06 ENCOUNTER — Other Ambulatory Visit: Payer: Self-pay | Admitting: Neurosurgery

## 2019-06-06 DIAGNOSIS — M21372 Foot drop, left foot: Secondary | ICD-10-CM | POA: Diagnosis not present

## 2019-06-06 DIAGNOSIS — M48061 Spinal stenosis, lumbar region without neurogenic claudication: Secondary | ICD-10-CM | POA: Diagnosis not present

## 2019-06-06 DIAGNOSIS — M545 Low back pain: Secondary | ICD-10-CM | POA: Diagnosis not present

## 2019-06-06 DIAGNOSIS — M5416 Radiculopathy, lumbar region: Secondary | ICD-10-CM | POA: Diagnosis not present

## 2019-06-06 DIAGNOSIS — M5126 Other intervertebral disc displacement, lumbar region: Secondary | ICD-10-CM | POA: Diagnosis not present

## 2019-06-14 NOTE — H&P (Signed)
Patient ID:   [id number] Patient: Jeff Burgess  Date of Birth: 1935-06-13 Visit Type: Office Visit   Date: 05/07/2019 03:00 PM Provider: Marchia Meiers. Vertell Limber MD   This 84 year old male presents for back pain.  HISTORY OF PRESENT ILLNESS: 1.  back pain  Patient returns today, with concerns for left foot weakness since a fall 2 weeks ago.  Patient states he fell in his kitchen 2 weeks ago hitting his back.  He reports no pain at present, only soreness with certain range of motion.  He reports he is unable to dorsiflex his left foot and reports slapping the floor with his left foot while walking.  The patient has developed a left foot drop with decreased sensation in his left foot.  He and his wife are not certain whether this happened awhile ago or after he fell.  She thinks his foot has been quite weak for quite a while.  He has a complete footdrop on the left and this is at least 16 to 32-week-old.  The patient has a history of diabetes.  I have recommended an urgent MRI of the lumbar spine to make sure there is no significant nerve root compression or acute change to explain his left foot weakness . We talked about the risks of surgery and whether or not this would improve with surgical intervention.  We also talked about AFO braces.  He is not at all certain that he would want surgery given his age in other medical issues      Medical/Surgical/Interim History Reviewed, no change.  Last detailed document date:03/20/2018.     PAST MEDICAL HISTORY, SURGICAL HISTORY, FAMILY HISTORY, SOCIAL HISTORY AND REVIEW OF SYSTEMS I have reviewed the patient's past medical, surgical, family and social history as well as the comprehensive review of systems as included on the [facility] history form dated 03/22/2019, which I have signed.  Family History: Reviewed, no changes.  Last detailed document date:03/20/2018.   Social History: Reviewed, no changes. Last  detailed document date: 03/20/2018.    MEDICATIONS: (added, continued or stopped this visit) Started Medication Directions Instruction Stopped  amlodipine 5 mg tablet take 1 tablet by oral route  every day    Aspir-81 mg tablet,delayed release take 1 tablet by oral route 2 times every day    metformin 500 mg tablet take 1 tablet by oral route 2 times every day with morning and evening meals    pravastatin 40 mg tablet take 1 tablet by oral route  every day    repaglinide 0.5 mg tablet take 1 tablet by oral route 3 times every day 15 to 30 minutes before meals    valsartan 320 mg-hydrochlorothiazide 25 mg tablet take 1 tablet by oral route  every day    Vitamin D3 1,000 unit capsule take 1 capsule by oral route  every day      ALLERGIES: Ingredient Reaction Medication Name Comment LOVASTATIN leg weakness   MORPHINE body hit  sweat    Reviewed, no changes.    PHYSICAL EXAM:  Vitals Date Temp F BP Pulse Ht In Wt Lb BMI BSA Pain Score 05/07/2019  138/71 70 69 187 27.61  3/10     IMPRESSION:  New onset left footdrop with history of diabetes  PLAN: .  Urgent lumbar MRI without contrast and follow-up with me  Orders: Diagnostic Procedures: Assessment Procedure M21.372 MRI Spine/lumb W/o Contrast Instruction(s)/Education: Assessment Instruction I10 Lifestyle education Z68.27 Dietary management education, guidance, and  counseling  Completed Orders (this encounter) Order Details Reason Side Interpretation Result Initial Treatment Date Region Lifestyle education Patient will follow up with Primary Care Physician.       Dietary management education, guidance, and counseling Encouraged patient to eat well balanced diet.        Assessment/Plan  # Detail Type Description  1. Assessment Foot drop, left (M21.372).     2. Assessment Diabetes insipidus (E23.2).      3. Assessment Essential (primary) hypertension (I10).     4. Assessment Body mass index (BMI) 27.0-27.9, adult [id number]).  Plan Orders Today's instructions / counseling include(s) Dietary management education, guidance, and counseling. Clinical information/comments: Encouraged patient to eat well balanced diet.       Pain Management Plan Pain Scale: 3/10. Method: Numeric Pain Intensity Scale. Location: back. Onset: 04/11/2019. Duration: varies. Quality: discomforting. Pain management follow-up plan of care: Patient will continue medication management..              Provider:  Marchia Meiers. Vertell Limber MD  05/08/2019 06:40 PM    Dictation edited by: Marchia Meiers. [facility]    CC Providers: [facility] [address] [place],  [place]  [id number]-   Aleksei Plotnikov  [place], [place] [address]-               Electronically signed by Marchia Meiers Vertell Limber MD on 05/08/2019 06:40 PM

## 2019-06-21 IMAGING — CR DG ORBITS FOR FOREIGN BODY
2 series · 2 of 2 positions shown · non-contrast
Comparison: None.

CLINICAL DATA: Metal working/exposure; clearance prior to MRI

EXAM:
ORBITS FOR FOREIGN BODY - 2 VIEW

[w orbit pa (1 of 2)]
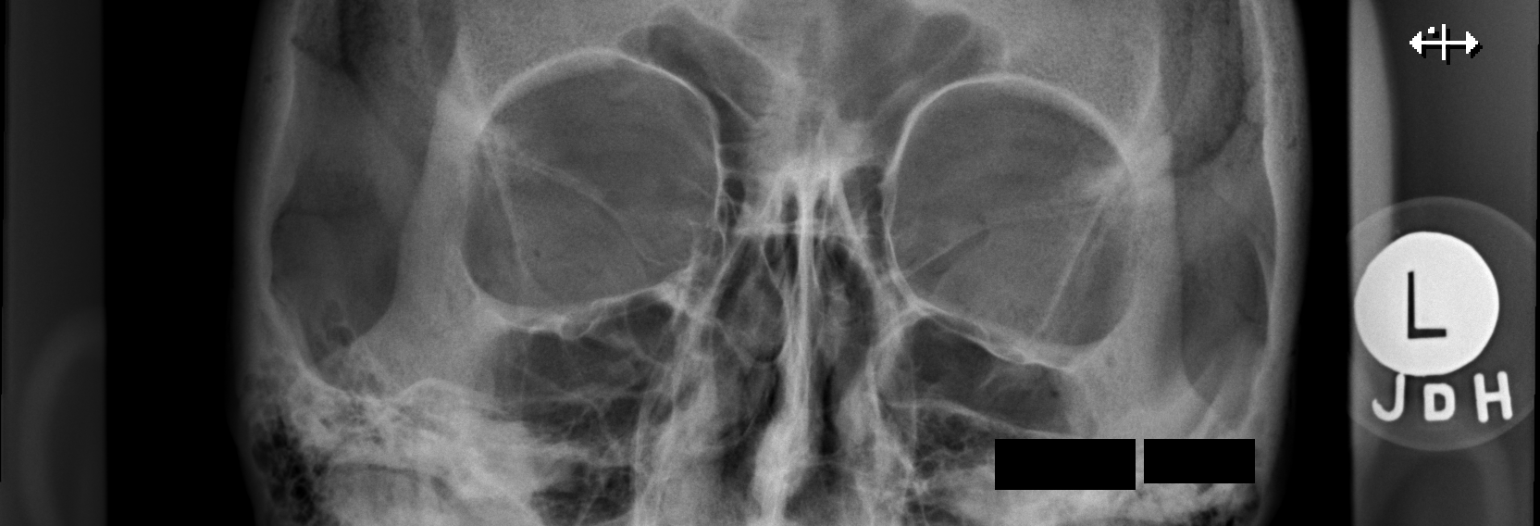

[w orbit pa (2 of 2)]
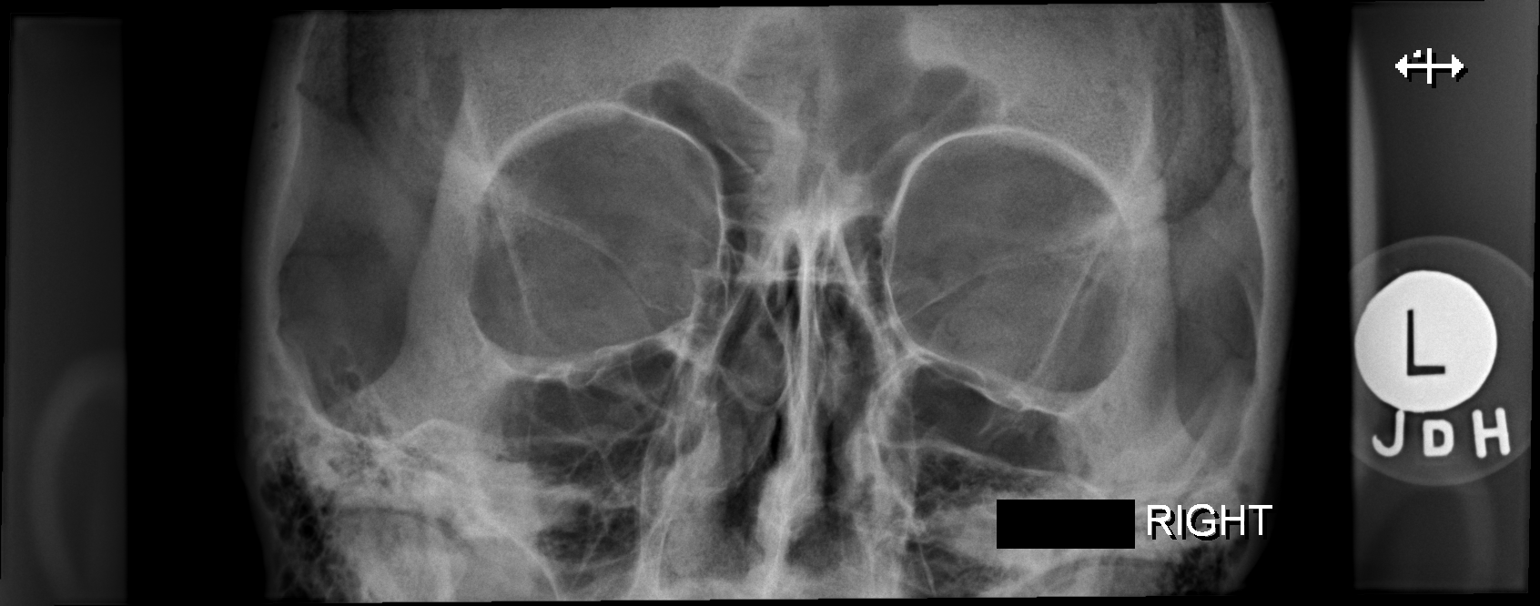

[2 of 2 positions shown; findings below may reference images not displayed]

FINDINGS: No radiopaque foreign body.  Ok to proceed to MRI.
IMPRESSION: No radiopaque foreign body.  Ok to proceed to MRI.

## 2019-06-21 IMAGING — MR MR LUMBAR SPINE W/O CM
4 of 5 series · 26 of 48 positions shown · non-contrast
Comparison: None.

CLINICAL DATA: Low back pain with left leg weakness.

EXAM:
MRI LUMBAR SPINE WITHOUT CONTRAST
TECHNIQUE: Multiplanar, multisequence MR imaging of the lumbar spine was
performed. No intravenous contrast was administered.

[Series 3: T2 post-contrast · sagittal · 4.0mm · 0.55mm/px · 6 of 17 slices shown]
[im 1/17]
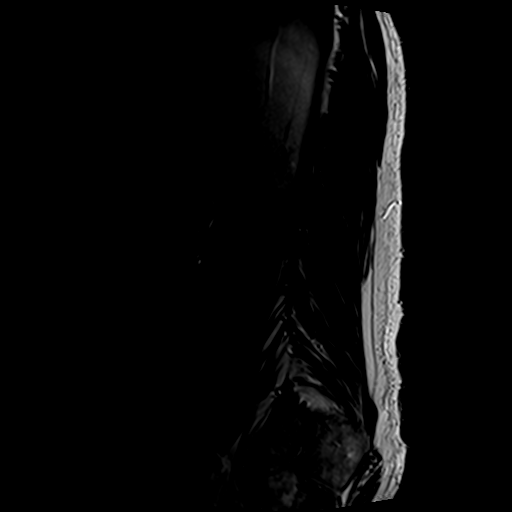
[im 4/17]
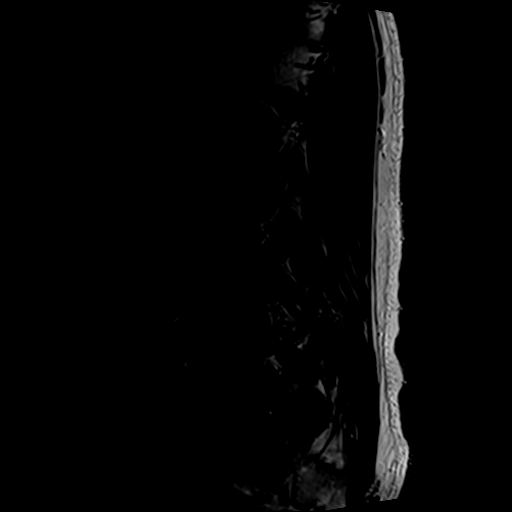
[im 7/17]
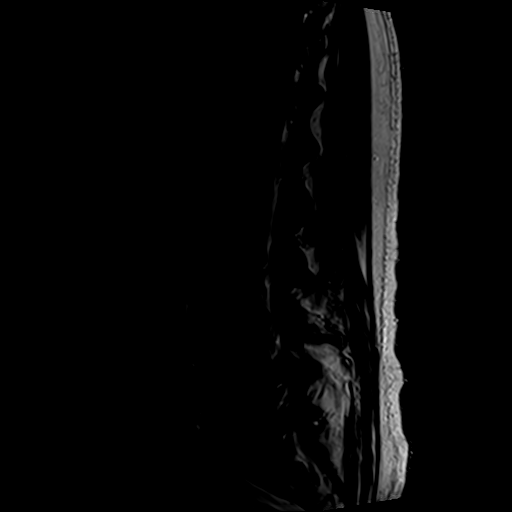
[im 10/17]
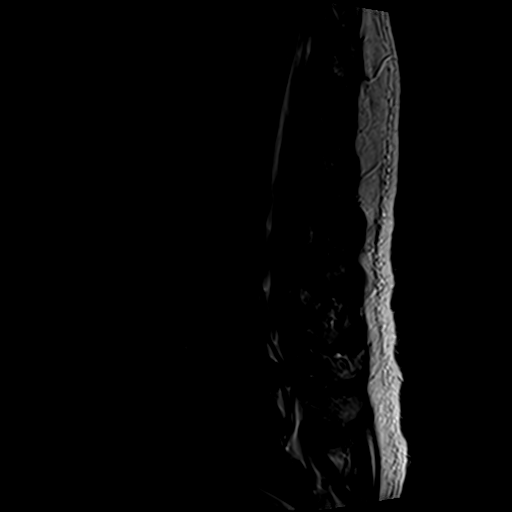
[im 13/17]
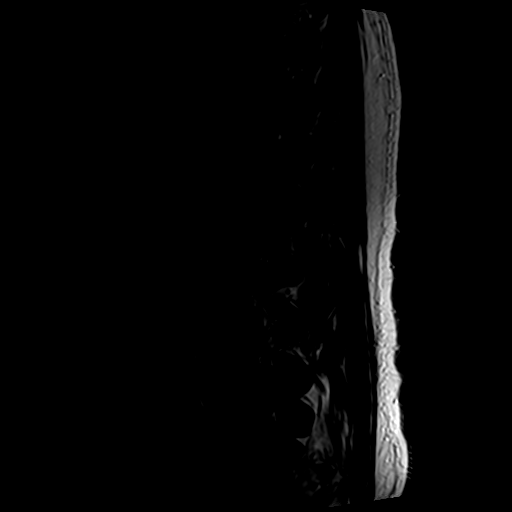
[im 17/17]
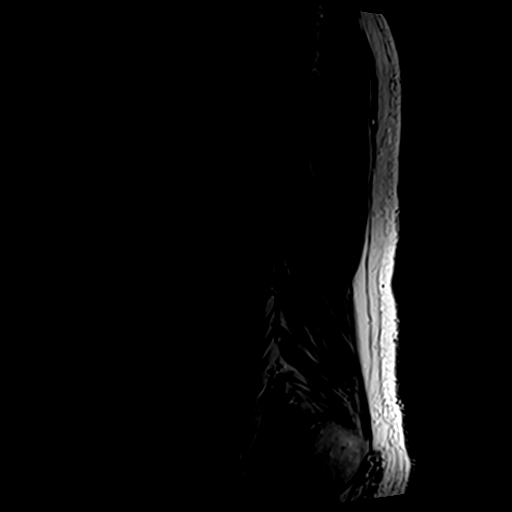

[Series 5: T1 · sagittal · 4.0mm · 0.55mm/px · 5 of 17 slices shown (1 of 2)]
[im 1/17]
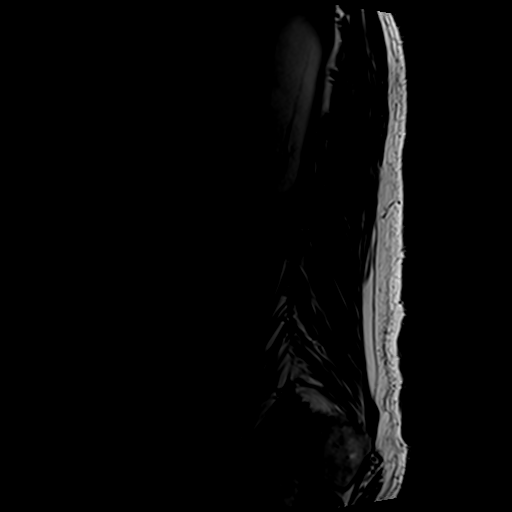
[im 5/17]
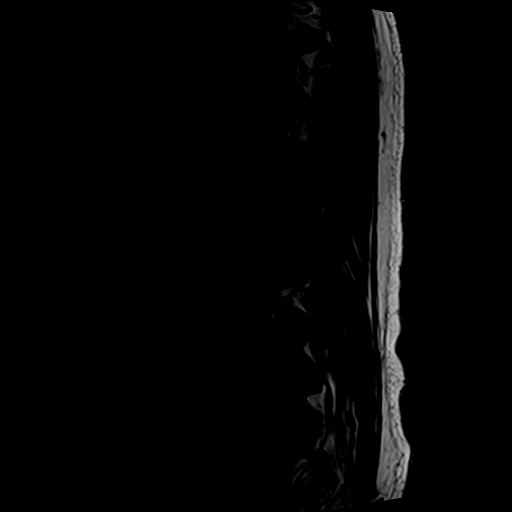
[im 9/17]
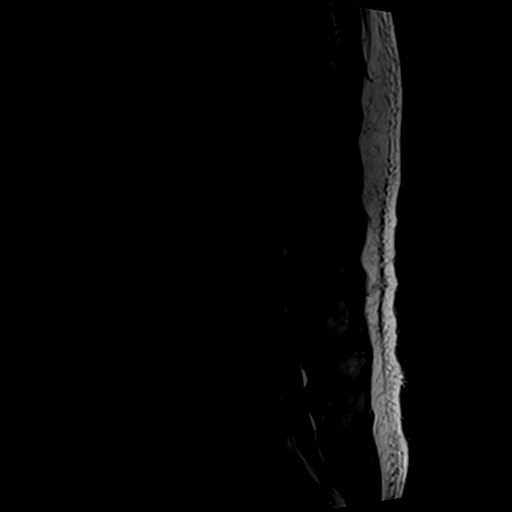
[im 13/17]
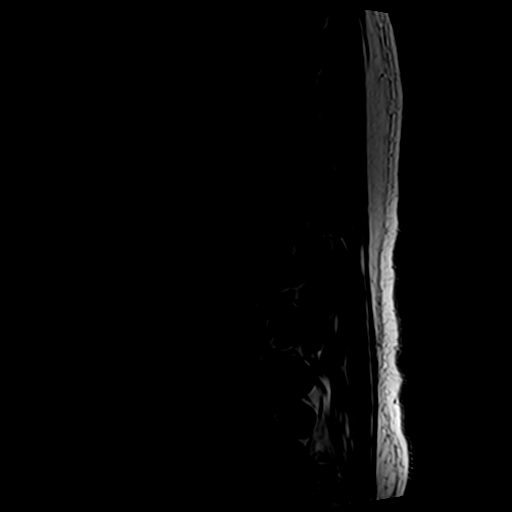
[im 17/17]
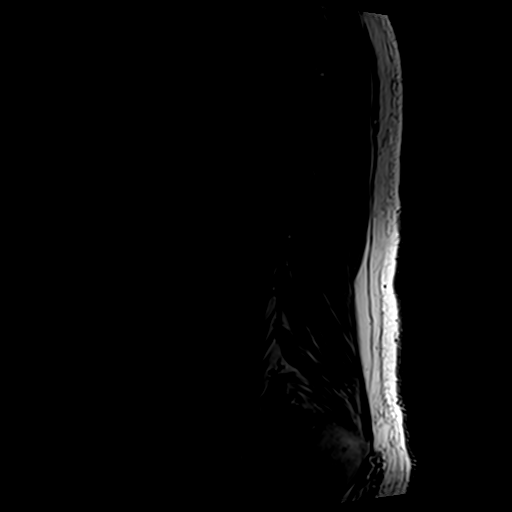

[Series 6: T1 · axial · 4.0mm · 0.35mm/px · z∈[-116,+109]mm · 5 of 50 slices shown (2 of 2)]
[im 4/50]
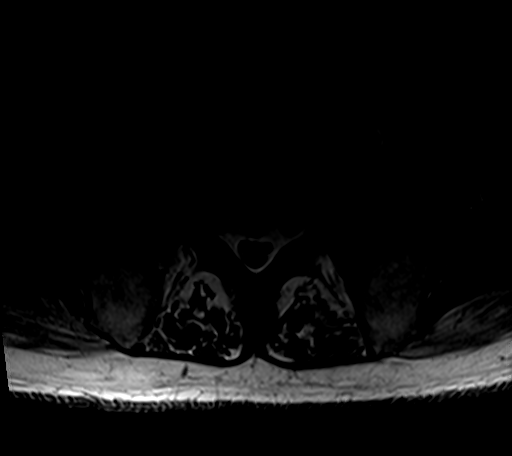
[im 7/50]
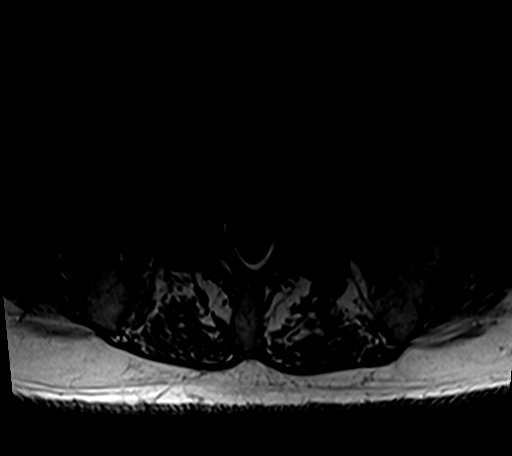
[im 10/50]
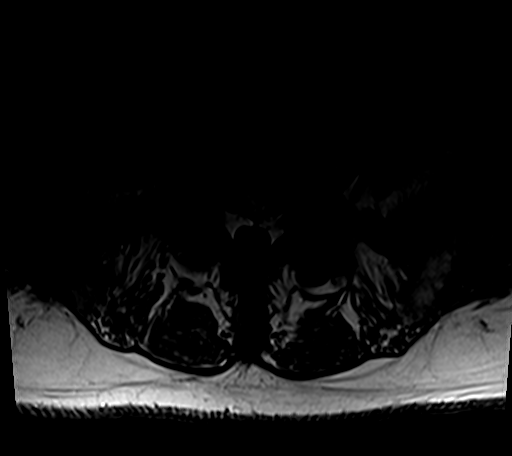
[im 27/50]
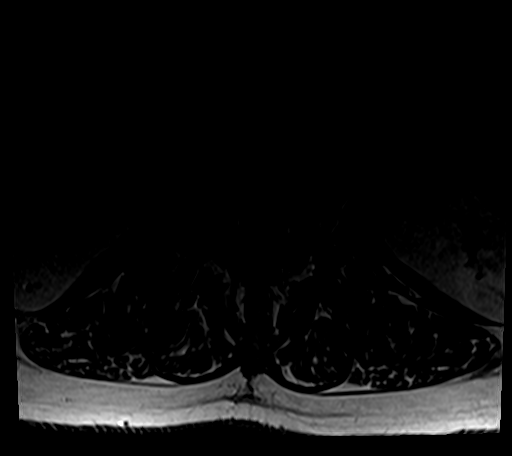
[im 43/50]
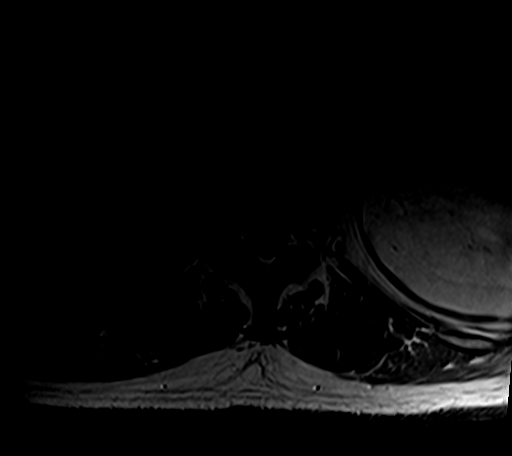

[Series 7: T2 · axial · 4.0mm · 0.70mm/px · z∈[-116,+145]mm · 10 of 50 slices shown]
[im 4/50]
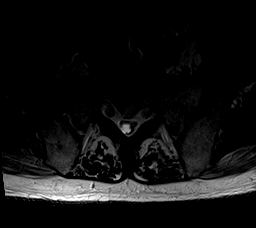
[im 7/50]
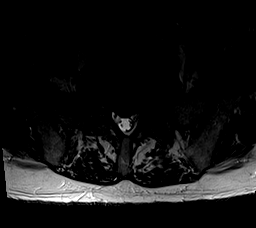
[im 10/50]
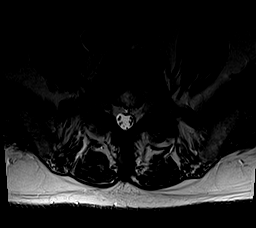
[im 17/50]
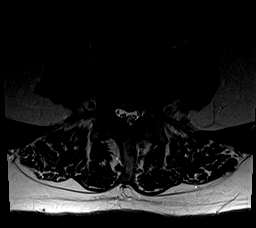
[im 23/50]
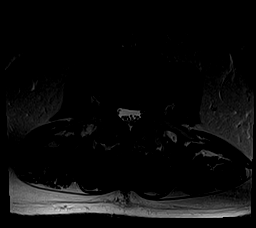
[im 27/50]
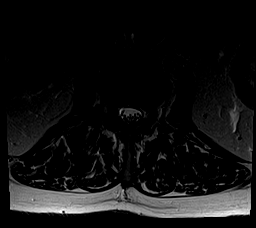
[im 30/50]
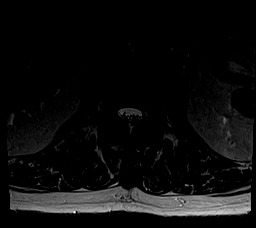
[im 36/50]
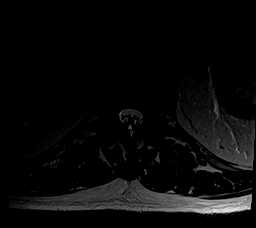
[im 43/50]
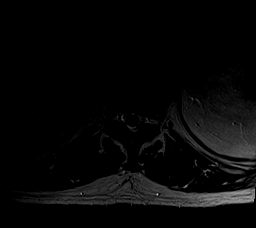
[im 50/50]
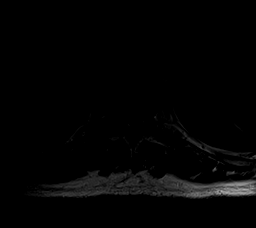

[26 of 48 positions shown; findings below may reference images not displayed]

FINDINGS: Segmentation:  Standard lumbar numbering

Alignment: Grade 1 anterolisthesis at L3-4, facet mediated. Mild
retrolisthesis at L5-S1 accentuated by endplate ridging

Vertebrae:  No fracture, evidence of discitis, or bone lesion.

Conus medullaris and cauda equina: Conus extends to the L1 level.
Conus and cauda equina appear normal.

Paraspinal and other soft tissues: Negative

Disc levels: Degenerative changes are superimposed on congenitally
narrow lower lumbar spinal canal from short pedicles.

T12- L1: Asymmetric right facet spurring.  No impingement

L1-L2: Mild disc narrowing.  Bilateral facet spurring.

L2-L3: Mild disc bulging and facet spurring.  No impingement

L3-L4: Degenerative facet hypertrophy with slight anterolisthesis.
Moderate spinal stenosis.

L4-L5: Advanced disc narrowing with bulging and endplate ridging.
Degenerative facet hypertrophy. There is moderate bilateral
foraminal stenosis. Moderate spinal stenosis.

L5-S1:Disc narrowing with endplate ridging and bulging. There is a
superimposed central protrusion that is noncompressive. Facet
hypertrophy asymmetric to the left where there is L5 foraminal
mass-effect on sagittal images. The right foramen is patent.
IMPRESSION: 1. Advanced disc degeneration at L4-5 and below and advanced facet
arthropathy at L3-4 and below.
2. Moderate spinal stenosis at L3-4 and L4-5 from degenerative
factors and short pedicles.
3. L5-S1 advanced left foraminal impingement.
4. L4-5 moderate bilateral foraminal stenosis.

## 2019-06-23 ENCOUNTER — Other Ambulatory Visit (HOSPITAL_COMMUNITY): Payer: Medicare Other

## 2019-06-25 ENCOUNTER — Encounter: Payer: Self-pay | Admitting: Internal Medicine

## 2019-06-25 ENCOUNTER — Ambulatory Visit (INDEPENDENT_AMBULATORY_CARE_PROVIDER_SITE_OTHER): Payer: Medicare Other | Admitting: Internal Medicine

## 2019-06-25 ENCOUNTER — Other Ambulatory Visit: Payer: Self-pay

## 2019-06-25 DIAGNOSIS — E1121 Type 2 diabetes mellitus with diabetic nephropathy: Secondary | ICD-10-CM | POA: Diagnosis not present

## 2019-06-25 DIAGNOSIS — M21372 Foot drop, left foot: Secondary | ICD-10-CM | POA: Diagnosis not present

## 2019-06-25 DIAGNOSIS — I10 Essential (primary) hypertension: Secondary | ICD-10-CM | POA: Diagnosis not present

## 2019-06-25 DIAGNOSIS — M5440 Lumbago with sciatica, unspecified side: Secondary | ICD-10-CM | POA: Diagnosis not present

## 2019-06-25 DIAGNOSIS — R269 Unspecified abnormalities of gait and mobility: Secondary | ICD-10-CM

## 2019-06-25 DIAGNOSIS — E785 Hyperlipidemia, unspecified: Secondary | ICD-10-CM | POA: Diagnosis not present

## 2019-06-25 DIAGNOSIS — Z01818 Encounter for other preprocedural examination: Secondary | ICD-10-CM | POA: Diagnosis not present

## 2019-06-25 DIAGNOSIS — E1122 Type 2 diabetes mellitus with diabetic chronic kidney disease: Secondary | ICD-10-CM

## 2019-06-25 DIAGNOSIS — I2510 Atherosclerotic heart disease of native coronary artery without angina pectoris: Secondary | ICD-10-CM

## 2019-06-25 LAB — BASIC METABOLIC PANEL
BUN: 20 mg/dL (ref 6–23)
CO2: 27 mEq/L (ref 19–32)
Calcium: 9.4 mg/dL (ref 8.4–10.5)
Chloride: 103 mEq/L (ref 96–112)
Creatinine, Ser: 1.16 mg/dL (ref 0.40–1.50)
GFR: 60.24 mL/min (ref >60.00)
Glucose, Bld: 135 mg/dL — ABNORMAL HIGH (ref 70–99)
Potassium: 3.6 mEq/L (ref 3.5–5.1)
Sodium: 137 mEq/L (ref 135–145)

## 2019-06-25 LAB — HEMOGLOBIN A1C: Hgb A1c MFr Bld: 7.5 % — ABNORMAL HIGH (ref 4.6–6.5)

## 2019-06-25 NOTE — Assessment & Plan Note (Signed)
Dr Vertell Limber is planning to operate on 07/03/19.

## 2019-06-25 NOTE — Assessment & Plan Note (Signed)
ASA, Amlodipine, Pravastatin

## 2019-06-25 NOTE — Assessment & Plan Note (Signed)
Continue w/ Telmisartan-HCT, Norvasc

## 2019-06-25 NOTE — Progress Notes (Signed)
Subjective:  Patient ID: Jeff Burgess, male    DOB: [birth year]  Age: 84 y.o. MRN: [id number]  CC: No chief complaint on file.   HPI Jeff Burgess presents for pre-op clearance  Requested by Dr Vertell Limber  Reason -  L foot drop, spinal surgery is pending on 6/22, med clearance  Hx: LBP w/LLE weakness. Dr Vertell Limber is planning to operate on 07/03/19. F/u HTN, CAD, dyslipidemia - all stable  Outpatient Medications Prior to Visit  Medication Sig Dispense Refill  . amLODipine (NORVASC) 5 MG tablet Take 1 tablet (5 mg total) by mouth daily. 90 tablet 3  . aspirin 81 MG tablet Take 2 tablets (162 mg total) by mouth daily. (Patient taking differently: Take 81 mg by mouth in the morning and at bedtime. ) 100 tablet 3  . cholecalciferol (VITAMIN D) 25 MCG (1000 UT) tablet Take 1 tablet (1,000 Units total) by mouth every morning. Follow-up due in April must see provider for refills (Patient taking differently: Take 1,000 Units by mouth daily. Follow-up due in April must see provider for refills) 90 tablet 0  . metFORMIN (GLUCOPHAGE-XR) 500 MG 24 hr tablet TAKE 1 TABLET BY MOUTH TWO  TIMES DAILY (Patient taking differently: Take 500 mg by mouth in the morning and at bedtime. ) 180 tablet 3  . pravastatin (PRAVACHOL) 40 MG tablet Take 1 tablet (40 mg total) by mouth daily. (Patient taking differently: Take 40 mg by mouth every evening. ) 90 tablet 3  . telmisartan-hydrochlorothiazide (MICARDIS HCT) 80-25 MG tablet Take 1 tablet by mouth daily. 90 tablet 3   No facility-administered medications prior to visit.    Past Medical History:  Diagnosis Date  . Benign localized prostatic hyperplasia with lower urinary tract symptoms (LUTS)   . Bladder cancer [facility]) dx 05/ 2017--  urologist- dr Pilar Jarvis   TCC , Ta of bladder  s/p TURBT 06-02-2015  . CAD (coronary artery disease)    no cardiologist--  followed by pcp,  dr plontnikov  . Chronic low back pain with sciatica   . CKD (chronic kidney disease),  stage III   . Diabetes mellitus, type 2 ([place])   . ED (erectile dysfunction)   . Full dentures   . History of ETT    08-09-2014   fair exercise tolerance, no chest pain, normal BP response, no ST changes (baseline RBBB);  negative adequate ETT  . Hyperlipidemia   . Hypertension   . Hypogonadism male   . OA (osteoarthritis)   . Right bundle branch block (RBBB)   . S/P CABG x 1    1998  . Sigmoid diverticulosis   . Ventral hernia    midline  . Wears glasses    Past Surgical History:  Procedure Laterality Date  . COLONOSCOPY WITH PROPOFOL  02-01-2012  . CORONARY ARTERY BYPASS GRAFT  1998  at [facility]   x1  vessel   . CYSTOSCOPY WITH BIOPSY N/A 10/08/2015   Procedure: CYSTOSCOPY WITH BLADDER BIOPSY;  Surgeon: Nickie Retort, MD;  Location: [facility];  Service: Urology;  Laterality: N/A;  . CYSTOSCOPY WITH FULGERATION N/A 10/08/2015   Procedure: CYSTOSCOPY WITH FULGERATION;  Surgeon: Nickie Retort, MD;  Location: [facility];  Service: Urology;  Laterality: N/A;  . TONSILLECTOMY  age 89  . TRANSURETHRAL RESECTION OF BLADDER TUMOR WITH GYRUS (TURBT-GYRUS) N/A 06/02/2015   Procedure: TRANSURETHRAL RESECTION OF BLADDER TUMOR WITH GYRUS (TURBT-GYRUS);  Surgeon: Nickie Retort, MD;  Location:  [place];  Service: Urology;  Laterality: N/A;    reports that he quit smoking about 48 years ago. His smoking use included cigarettes. He quit after 15.00 years of use. His smokeless tobacco use includes snuff. He reports that he does not drink alcohol and does not use drugs. family history includes Alcohol abuse (age of onset: 91) in his father; Arthritis (age of onset: 23) in his mother; Diabetes in his brother; Heart disease in his brother; Hypertension in an other family member; Kidney disease in his brother. Allergies  Allergen Reactions  . Coreg [Carvedilol] Other (See Comments)    weakness  . Lovastatin     Leg weakness  .  Morphine Sulfate Other (See Comments)    Per the pt" after taking morphine within 30 mins I was soaking wet, I did not like the way it made me feel"    ROS: Review of Systems  Constitutional: Negative for appetite change, fatigue and unexpected weight change.  HENT: Negative for congestion, nosebleeds, sneezing, sore throat and trouble swallowing.   Eyes: Negative for itching and visual disturbance.  Respiratory: Negative for cough.   Cardiovascular: Negative for chest pain, palpitations and leg swelling.  Gastrointestinal: Negative for abdominal distention, blood in stool, diarrhea and nausea.  Genitourinary: Negative for frequency and hematuria.  Musculoskeletal: Positive for arthralgias, back pain and gait problem. Negative for joint swelling and neck pain.  Skin: Negative for rash.  Neurological: Positive for weakness. Negative for dizziness, tremors and speech difficulty.  Psychiatric/Behavioral: Negative for agitation, dysphoric mood and sleep disturbance. The patient is not nervous/anxious.     Objective:  BP 122/80 (BP Location: Right Arm, Patient Position: Sitting, Cuff Size: Normal)   Pulse 97   Temp 97.9 F (36.6 C) (Oral)   Ht 5' 7.5" (1.715 m)   Wt 191 lb (86.6 kg)   SpO2 97%   BMI 29.47 kg/m   BP Readings from Last 3 Encounters:  06/25/19 122/80  05/14/19 (!) 172/86  03/26/19 (!) 160/76    Wt Readings from Last 3 Encounters:  06/25/19 191 lb (86.6 kg)  05/14/19 186 lb (84.4 kg)  03/26/19 193 lb (87.5 kg)    Physical Exam Constitutional:      General: He is not in acute distress.    Appearance: He is well-developed.     Comments: NAD  Eyes:     Conjunctiva/sclera: Conjunctivae normal.     Pupils: Pupils are equal, round, and reactive to light.  Neck:     Thyroid: No thyromegaly.     Vascular: No JVD.  Cardiovascular:     Rate and Rhythm: Normal rate and regular rhythm.     Heart sounds: Normal heart sounds. No murmur heard.  No friction rub. No  gallop.   Pulmonary:     Effort: Pulmonary effort is normal. No respiratory distress.     Breath sounds: Normal breath sounds. No wheezing or rales.  Chest:     Chest wall: No tenderness.  Abdominal:     General: Bowel sounds are normal. There is no distension.     Palpations: Abdomen is soft. There is no mass.     Tenderness: There is no abdominal tenderness. There is no guarding or rebound.  Musculoskeletal:        General: Tenderness present. Normal range of motion.     Cervical back: Normal range of motion.  Lymphadenopathy:     Cervical: No cervical adenopathy.  Skin:    General: Skin is  warm and dry.     Findings: No rash.  Neurological:     Mental Status: He is alert and oriented to person, place, and time.     Cranial Nerves: No cranial nerve deficit.     Motor: Weakness present. No abnormal muscle tone.     Coordination: Coordination normal.     Gait: Gait abnormal.     Deep Tendon Reflexes: Reflexes are normal and symmetric.  Psychiatric:        Behavior: Behavior normal.        Thought Content: Thought content normal.        Judgment: Judgment normal.    LLE weakness Cane  Lab Results  Component Value Date   WBC 10.2 10/08/2015   HGB 15.7 10/08/2015   HCT 45.3 10/08/2015   PLT 211 10/08/2015   GLUCOSE 178 (H) 03/26/2019   CHOL 122 10/03/2012   TRIG 166.0 (H) 10/03/2012   HDL 29.20 (L) 10/03/2012   LDLDIRECT 78.4 11/02/2011   LDLCALC 60 10/03/2012   ALT 16 08/11/2015   AST 14 08/11/2015   NA 138 03/26/2019   K 3.9 03/26/2019   CL 103 03/26/2019   CREATININE 1.12 03/26/2019   BUN 28 (H) 03/26/2019   CO2 27 03/26/2019   TSH 1.53 10/03/2012   PSA 1.48 10/03/2012   INR 1.02 10/08/2015   HGBA1C 6.9 (H) 03/26/2019    MR LUMBAR SPINE WO CONTRAST  Result Date: 05/29/2019 CLINICAL DATA:  Left foot drop.  Low back pain. EXAM: MRI LUMBAR SPINE WITHOUT CONTRAST TECHNIQUE: Multiplanar, multisequence MR imaging of the lumbar spine was performed. No  intravenous contrast was administered. COMPARISON:  01/13/2018 FINDINGS: Segmentation:  5 lumbar type vertebrae Alignment:  Mild retrolisthesis at L5-S1. Vertebrae:  No fracture, evidence of discitis, or bone lesion. Conus medullaris and cauda equina: Conus extends to the L1 level. Conus and cauda equina appear normal. Paraspinal and other soft tissues: Partially covered sigmoid diverticulosis. Disc levels: T12- L1: Unremarkable. L1-L2: Mild disc bulging and facet spurring.  No neural compression L2-L3: Mild disc bulging and facet spurring.  No neural compression L3-L4: Disc narrowing and bulging with posterior annular fissure. Degenerative facet spurring on both sides. Mild-to-moderate spinal stenosis which is noncompressive. Patent foramina. L4-L5: Advanced disc narrowing with circumferential bulging and endplate spurring. Degenerative posterior element hypertrophy. Moderate spinal stenosis , especially bilateral subarticular recess narrowing. Moderate bilateral foraminal narrowing L5-S1:Disc narrowing and bulging with central protrusion. Endplate spurring asymmetric to the left. Facet spurring asymmetric to the left. Mild spinal stenosis. Foraminal impingement on the left with nerve root flattening due to endplate spur and disc bulge. IMPRESSION: 1. Advanced lower lumbar spine degeneration, especially at L4-5 and L5-S1. 2. L4-5 moderate spinal and bilateral foraminal stenosis. 3. L5-S1 asymmetric left foraminal impingement. Electronically Signed   By: Monte Fantasia M.D.   On: 05/29/2019 08:49    Assessment & Plan:     Follow-up: No follow-ups on file.  Walker Kehr, MD

## 2019-06-25 NOTE — Assessment & Plan Note (Addendum)
  The patient is medically clear for surgery. Pre-op plans - as you ordered. Dr Vertell Limber is planning to operate on 07/03/19.  Thank you,

## 2019-06-25 NOTE — Addendum Note (Signed)
Addended by: Trenda Moots on: 3/69/2230 02:08 PM   Modules accepted: Orders

## 2019-06-25 NOTE — Assessment & Plan Note (Addendum)
Cont current meds - Metformin 500 mg bid

## 2019-06-26 ENCOUNTER — Telehealth: Payer: Self-pay | Admitting: Internal Medicine

## 2019-06-26 ENCOUNTER — Other Ambulatory Visit: Payer: Self-pay | Admitting: Internal Medicine

## 2019-06-26 MED ORDER — REPAGLINIDE 1 MG PO TABS: 1.0000 mg | ORAL_TABLET | Freq: Three times a day (TID) | ORAL | 11 refills | Status: DC

## 2019-06-26 NOTE — Telephone Encounter (Signed)
Patient returning call to discuss lab results  ?

## 2019-06-26 NOTE — Telephone Encounter (Signed)
See 06/25/2019 labs

## 2019-06-26 NOTE — Pre-Procedure Instructions (Signed)
[place], [place] [address] Phone: [phone] Fax: [phone]  [id number] - [place], [place] [address] Phone: [phone] Fax: [phone]    Your procedure is scheduled on Tues., July 03, 2019 from 12:02PM-4:02PM  Report to [facility] Entrance "A" at 10:00AM  Call this number if you have problems the morning of surgery:  [phone]   Remember:  Do not eat or drink after midnight on June 21st    Take these medicines the morning of surgery with A SIP OF WATER: AmLODipine (NORVASC)   Follow your surgeon's instructions on when to stop Aspirin.  If no instructions were given by your surgeon then you will need to call the office to get those instructions.     As of today, STOP taking all Aspirin (unless instructed by your doctor) and Other Aspirin containing products, Vitamins, Fish oils, and Herbal medications. Also stop all NSAIDS i.e. Advil, Ibuprofen, Motrin, Aleve, Anaprox, Naproxen, BC, Goody Powders, and all Supplements.   . Do not take MetFORMIN (GLUCOPHAGE-XR) and Repaglinide (PRANDIN)  the morning of surgery.   How to Manage Your Diabetes Before and After Surgery  Why is it important to control my blood sugar before and after surgery? . Improving blood sugar levels before and after surgery helps healing and can limit problems. . A way of improving blood sugar control is eating a healthy diet by: o  Eating less sugar and carbohydrates o  Increasing activity/exercise o  Talking with your doctor about reaching your blood sugar goals . High blood sugars (greater than 180 mg/dL) can raise your risk of infections and slow your recovery, so you will need to focus on controlling your diabetes during the weeks before surgery. . Make sure that the doctor who takes care of your diabetes knows about your  planned surgery including the date and location.  How do I manage my blood sugar before surgery? . Check your blood sugar at least 4 times a day, starting 2 days before surgery, to make sure that the level is not too high or low. o Check your blood sugar the morning of your surgery when you wake up and every 2 hours until you get to the Short Stay unit. . If your blood sugar is less than 70 mg/dL, you will need to treat for low blood sugar: o Do not take insulin. o Treat a low blood sugar (less than 70 mg/dL) with  cup of clear juice (cranberry or apple), 4 glucose tablets, OR glucose gel. Recheck blood sugar in 15 minutes after treatment (to make sure it is greater than 70 mg/dL). If your blood sugar is not greater than 70 mg/dL on recheck, call [phone] o  for further instructions.                                                         . If your CBG is greater than 220 mg/dL, call the number above for further instructions.  . If you are admitted to the hospital after surgery: o Your blood sugar will be checked by the staff and you will probably be given insulin after surgery (instead of oral diabetes medicines) to make sure  you have good blood sugar levels. o The goal for blood sugar control after surgery is 80-180 mg/dL.  Reviewed and Endorsed by [facility] Patient Education Committee, August 2015   No Smoking of any kind, Tobacco, or Alcohol products 24 hours prior to your procedure. If you use a Cpap at night, you may bring all equipment for your overnight stay.    Special instructions:   - Preparing For Surgery  Before surgery, you can play an important role. Because skin is not sterile, your skin needs to be as free of germs as possible. You can reduce the number of germs on your skin by washing with CHG (chlorahexidine gluconate) Soap before surgery.  CHG is an antiseptic cleaner which kills germs and bonds with the skin to continue killing germs even after  washing.    Please do not use if you have an allergy to CHG or antibacterial soaps. If your skin becomes reddened/irritated stop using the CHG.  Do not shave (including legs and underarms) for at least 48 hours prior to first CHG shower. It is OK to shave your face.  Please follow these instructions carefully.   1. Shower the NIGHT BEFORE SURGERY and the MORNING OF SURGERY with CHG.   2. If you chose to wash your hair, wash your hair first as usual with your normal shampoo.  3. After you shampoo, rinse your hair and body thoroughly to remove the shampoo.  4. Use CHG as you would any other liquid soap. You can apply CHG directly to the skin and wash gently with a scrungie or a clean washcloth.   5. Apply the CHG Soap to your body ONLY FROM THE NECK DOWN.  Do not use on open wounds or open sores. Avoid contact with your eyes, ears, mouth and genitals (private parts). Wash Face and genitals (private parts)  with your normal soap.  6. Wash thoroughly, paying special attention to the area where your surgery will be performed.  7. Thoroughly rinse your body with warm water from the neck down.  8. DO NOT shower/wash with your normal soap after using and rinsing off the CHG Soap.  9. Pat yourself dry with a CLEAN TOWEL.  10. Wear CLEAN PAJAMAS to bed the night before surgery, wear comfortable clothes the morning of surgery  11. Place CLEAN SHEETS on your bed the night of your first shower and DO NOT SLEEP WITH PETS.  Day of Surgery:             Remember to brush your teeth WITH YOUR REGULAR TOOTHPASTE.   Do not wear jewelry.  Do not wear lotions, powders, colognes, or deodorant.  Do not shave 48 hours prior to surgery.  Men may shave face and neck.  Do not bring valuables to the hospital.  [facility] is not responsible for any belongings or valuables.  Contacts, dentures or bridgework may not be worn into surgery.    For patients admitted to the hospital, discharge time will be  determined by your treatment team.  Patients discharged the day of surgery will not be allowed to drive home, and someone age 58 and over needs to stay with them for 24 hours.   Please wear clean clothes to the hospital/surgery center.    Please read over the following fact sheets that you were given.

## 2019-06-27 ENCOUNTER — Other Ambulatory Visit: Payer: Self-pay

## 2019-06-27 ENCOUNTER — Encounter (HOSPITAL_COMMUNITY)
Admission: RE | Admit: 2019-06-27 | Discharge: 2019-06-27 | Disposition: A | Discharge status: Home / Self Care | Payer: Medicare Other | Source: Ambulatory Visit | Attending: Neurosurgery | Admitting: Neurosurgery

## 2019-06-27 ENCOUNTER — Encounter (HOSPITAL_COMMUNITY): Payer: Self-pay

## 2019-06-27 DIAGNOSIS — I10 Essential (primary) hypertension: Secondary | ICD-10-CM | POA: Diagnosis not present

## 2019-06-27 DIAGNOSIS — Z01818 Encounter for other preprocedural examination: Secondary | ICD-10-CM | POA: Insufficient documentation

## 2019-06-27 LAB — CBC
HCT: 43.7 % (ref 39.0–52.0)
Hemoglobin: 14.6 g/dL (ref 13.0–17.0)
MCH: 30.1 pg (ref 26.0–34.0)
MCHC: 33.4 g/dL (ref 30.0–36.0)
MCV: 90.1 fL (ref 80.0–100.0)
Platelets: 229 10*3/uL (ref 150–400)
RBC: 4.85 MIL/uL (ref 4.22–5.81)
RDW: 12.9 % (ref 11.5–15.5)
WBC: 11.2 10*3/uL — ABNORMAL HIGH (ref 4.0–10.5)
nRBC: 0 % (ref 0.0–0.2)

## 2019-06-27 LAB — TYPE AND SCREEN
ABO/RH(D): A POS
Antibody Screen: NEGATIVE

## 2019-06-27 LAB — GLUCOSE, CAPILLARY: Glucose-Capillary: 143 mg/dL — ABNORMAL HIGH (ref 70–99)

## 2019-06-27 LAB — ABO/RH: ABO/RH(D): A POS

## 2019-06-27 LAB — SURGICAL PCR SCREEN
MRSA, PCR: NEGATIVE
Staphylococcus aureus: NEGATIVE

## 2019-06-27 NOTE — Progress Notes (Signed)
PCP - Plotnikov Cardiologist - denies  Chest x-ray - n/a EKG - 06-27-19  DM - Type 2 Fasting Blood Sugar - patient stated he rarely checks CBGs  Aspirin Instructions: Follow your surgeon's instructions on when to stop ASA   COVID TEST- Saturday 06-30-19   Anesthesia review: yes, heart history  Patient denies shortness of breath, fever, cough and chest pain at PAT appointment   All instructions explained to the patient, with a verbal understanding of the material. Patient agrees to go over the instructions while at home for a better understanding. Patient also instructed to self quarantine after being tested for COVID-19. The opportunity to ask questions was provided.

## 2019-06-27 NOTE — Progress Notes (Addendum)
Anesthesia Chart Review:  Case: [id number] Date/Time: 07/03/19 1147   Procedure: Lumbar 4-5 Lumbar 5 Sacral 1 Posterior lumbar interbody fusion (N/A Back)   Anesthesia type: General   Pre-op diagnosis: degenerative lumbar spinal stenosis   Location: [facility] OR ROOM 21 / [facility]   Surgeons: Erline Levine, MD      DISCUSSION: Patient is an 84 year old male scheduled for the above procedure. By notes, he has left foot drop and needs spinal surgery.   History includes former smoker (quit 01/18/71), HTN, HLD, CAD (s/p 1V CABG 1998), RBBB, DM2, CKD (stage III), chronic back pain, bladder cancer (s/p TURBT 06/02/15), BPH, sigmoid diverticulosis, ventral hernia. He is on ASA, amlodipine, telmisartan-HCTZ, pravastatin for CAD/HTN (Coreg stopped in 2016 due to "weakness") and on Prandin and metformin for DM2. A1c 7.5%. He wrote, "The patient is medically clear for surgery."  Patient has a remote history of CABG in 1998--CAD has been followed by PCP Plotnikov, Evie Lacks, MD for years. Negative ETT 2016. Dr. Vertell Limber did request surgical clearance from Dr. Alain Marion. Patient seen on 06/25/19. He notes that HTN, CAD, dyslipidemia are "all stable".  Patient with leg weakness and left "foot drop" since last fall by notes. His CAD is followed by PCP who provided surgical clearance. I left a message for patient to contact me, but he denied shortness of breath, cough, fever, chest pain at PAT RN visit. Discussed available information with anesthesiologist Lillia Abed, MD. If patient asymptomatic from a CV standpoint and otherwise no acute changes then it is anticipated that he can proceed as planned. If patient calls then I will plan to update note.  PAT RN instructed patient to follow surgeon recommendations regarding pre-operative ASA instructions.   Presurgical COVID-19 test is scheduled for 06/30/2019.(UPDATE 07/02/19 12:09 PM: Patient did not return call following left voice message. I attempted to contact him today at  preferred number and no answer except voice message.  Unless call back, anesthesiologist to evaluate on the day of surgery. Surgical clearance from Dr. Alain Marion on chart. 06/30/2019 COVID-19 test negative.)   VS: BP (!) 160/87   Pulse 92   Temp 36.8 C (Oral)   Resp 20   Ht 5\' 9"  (1.753 m)   Wt 84.5 kg   SpO2 97%   BMI 27.50 kg/m     PROVIDERS: Plotnikov, Evie Lacks, MD is PCP  Baruch Gouty, MD is urologist - He was evaluated by vascular surgeon Ruta Hinds, MD on 11/24/17 for BLE weakness and balance issues. ABIs essentially normal. Symptoms not consistent with claudication, but felt more related to pseudoclaudication from back issues.     LABS: Labs reviewed: Acceptable for surgery. (Labs from 06/25/19 and 06/27/19 reviewed.) (all labs ordered are listed, but only abnormal results are displayed)  Labs Reviewed  GLUCOSE, CAPILLARY - Abnormal; Notable for the following components:      Result Value   Glucose-Capillary 143 (*)    All other components within normal limits  CBC - Abnormal; Notable for the following components:   WBC 11.2 (*)    All other components within normal limits  SURGICAL PCR SCREEN  TYPE AND SCREEN  ABO/RH   Lab Results  Component Value Date   WBC 11.2 (H) 06/27/2019   HGB 14.6 06/27/2019   HCT 43.7 06/27/2019   PLT 229 06/27/2019   GLUCOSE 135 (H) 06/25/2019   NA 137 06/25/2019   K 3.6 06/25/2019   CL 103 06/25/2019   CREATININE 1.16 06/25/2019   BUN 20 06/25/2019  CO2 27 06/25/2019   HGBA1C 7.5 (H) 06/25/2019    IMAGES: MRI L-spine 05/29/19: IMPRESSION: 1. Advanced lower lumbar spine degeneration, especially at L4-5 and L5-S1. 2. L4-5 moderate spinal and bilateral foraminal stenosis. 3. L5-S1 asymmetric left foraminal impingement.   EKG: 06/27/2019: Normal sinus rhythm Right bundle branch block Abnormal ECG - EKG appears stable when compared to 07/12/14 tracing.   CV: Carotid US 03/24/16:  Impression: Shadowing plaque,  bilaterally. 1 to 39% bilateral ICA stenosis. Patent vertebral arteries with antegrade flow. Normal subclavian arteries, bilaterally.   ETT 08/09/14:  There was no ST segment deviation noted during stress. ETT with fair exercise tolerance; no chest pain; normal BP response; no ST changes (baseline RBBB); negative adequate ETT.   Past Medical History:  Diagnosis Date  . Benign localized prostatic hyperplasia with lower urinary tract symptoms (LUTS)   . Bladder cancer [facility]) dx 05/ 2017--  urologist- dr Pilar Jarvis   TCC , Ta of bladder  s/p TURBT 06-02-2015  . CAD (coronary artery disease)    no cardiologist--  followed by pcp,  dr plontnikov  . Chronic low back pain with sciatica   . CKD (chronic kidney disease), stage III   . Diabetes mellitus, type 2 ([place])   . ED (erectile dysfunction)   . Full dentures   . History of ETT    08-09-2014   fair exercise tolerance, no chest pain, normal BP response, no ST changes (baseline RBBB);  negative adequate ETT  . Hyperlipidemia   . Hypertension   . Hypogonadism male   . OA (osteoarthritis)   . Right bundle branch block (RBBB)   . S/P CABG x 1    1998  . Sigmoid diverticulosis   . Ventral hernia    midline  . Wears glasses     Past Surgical History:  Procedure Laterality Date  . COLONOSCOPY WITH PROPOFOL  02-01-2012  . CORONARY ARTERY BYPASS GRAFT  1998  at [facility]   x1  vessel   . CYSTOSCOPY WITH BIOPSY N/A 10/08/2015   Procedure: CYSTOSCOPY WITH BLADDER BIOPSY;  Surgeon: Nickie Retort, MD;  Location: [facility];  Service: Urology;  Laterality: N/A;  . CYSTOSCOPY WITH FULGERATION N/A 10/08/2015   Procedure: CYSTOSCOPY WITH FULGERATION;  Surgeon: Nickie Retort, MD;  Location: [facility];  Service: Urology;  Laterality: N/A;  . TONSILLECTOMY  age 82  . TRANSURETHRAL RESECTION OF BLADDER TUMOR WITH GYRUS (TURBT-GYRUS) N/A 06/02/2015   Procedure: TRANSURETHRAL RESECTION OF BLADDER TUMOR  WITH GYRUS (TURBT-GYRUS);  Surgeon: Nickie Retort, MD;  Location: [facility];  Service: Urology;  Laterality: N/A;    MEDICATIONS: . amLODipine (NORVASC) 5 MG tablet  . aspirin 81 MG tablet  . cholecalciferol (VITAMIN D) 25 MCG (1000 UT) tablet  . metFORMIN (GLUCOPHAGE-XR) 500 MG 24 hr tablet  . pravastatin (PRAVACHOL) 40 MG tablet  . repaglinide (PRANDIN) 1 MG tablet  . telmisartan-hydrochlorothiazide (MICARDIS HCT) 80-25 MG tablet   No current facility-administered medications for this encounter.    Myra Gianotti, PA-C Surgical Short Stay/Anesthesiology [facility] Phone [phone] [facility] Phone [phone] 06/28/2019 4:11 PM

## 2019-06-30 ENCOUNTER — Other Ambulatory Visit (HOSPITAL_COMMUNITY)
Admission: RE | Admit: 2019-06-30 | Discharge: 2019-06-30 | Disposition: A | Discharge status: Home / Self Care | Payer: Medicare Other | Source: Ambulatory Visit | Attending: Neurosurgery | Admitting: Neurosurgery

## 2019-06-30 DIAGNOSIS — M5416 Radiculopathy, lumbar region: Secondary | ICD-10-CM | POA: Diagnosis not present

## 2019-06-30 DIAGNOSIS — Z951 Presence of aortocoronary bypass graft: Secondary | ICD-10-CM | POA: Diagnosis not present

## 2019-06-30 DIAGNOSIS — Z87891 Personal history of nicotine dependence: Secondary | ICD-10-CM | POA: Diagnosis not present

## 2019-06-30 DIAGNOSIS — Z20822 Contact with and (suspected) exposure to covid-19: Secondary | ICD-10-CM | POA: Insufficient documentation

## 2019-06-30 DIAGNOSIS — I2510 Atherosclerotic heart disease of native coronary artery without angina pectoris: Secondary | ICD-10-CM | POA: Diagnosis not present

## 2019-06-30 DIAGNOSIS — Z7984 Long term (current) use of oral hypoglycemic drugs: Secondary | ICD-10-CM | POA: Diagnosis not present

## 2019-06-30 DIAGNOSIS — I10 Essential (primary) hypertension: Secondary | ICD-10-CM | POA: Diagnosis not present

## 2019-06-30 DIAGNOSIS — M21372 Foot drop, left foot: Secondary | ICD-10-CM | POA: Diagnosis not present

## 2019-06-30 DIAGNOSIS — E119 Type 2 diabetes mellitus without complications: Secondary | ICD-10-CM | POA: Diagnosis not present

## 2019-06-30 DIAGNOSIS — M48061 Spinal stenosis, lumbar region without neurogenic claudication: Secondary | ICD-10-CM | POA: Diagnosis not present

## 2019-06-30 DIAGNOSIS — Z01812 Encounter for preprocedural laboratory examination: Secondary | ICD-10-CM | POA: Insufficient documentation

## 2019-06-30 DIAGNOSIS — Z885 Allergy status to narcotic agent status: Secondary | ICD-10-CM | POA: Diagnosis not present

## 2019-06-30 DIAGNOSIS — Z79899 Other long term (current) drug therapy: Secondary | ICD-10-CM | POA: Diagnosis not present

## 2019-06-30 DIAGNOSIS — Z7982 Long term (current) use of aspirin: Secondary | ICD-10-CM | POA: Diagnosis not present

## 2019-06-30 LAB — SARS CORONAVIRUS 2 (TAT 6-24 HRS): SARS Coronavirus 2: NEGATIVE

## 2019-07-02 ENCOUNTER — Other Ambulatory Visit: Payer: Self-pay | Admitting: Neurosurgery

## 2019-07-03 ENCOUNTER — Other Ambulatory Visit: Payer: Self-pay

## 2019-07-03 ENCOUNTER — Inpatient Hospital Stay (HOSPITAL_COMMUNITY): Payer: Medicare Other | Admitting: Vascular Surgery

## 2019-07-03 ENCOUNTER — Encounter (HOSPITAL_COMMUNITY): Payer: Self-pay | Admitting: Neurosurgery

## 2019-07-03 ENCOUNTER — Inpatient Hospital Stay (HOSPITAL_COMMUNITY): Payer: Medicare Other

## 2019-07-03 ENCOUNTER — Encounter (HOSPITAL_COMMUNITY)
Admission: RE | Disposition: A | Discharge status: Home / Self Care | Payer: Self-pay | Source: Home / Self Care | Attending: Neurosurgery

## 2019-07-03 ENCOUNTER — Inpatient Hospital Stay (HOSPITAL_COMMUNITY)
Admission: RE | Admit: 2019-07-03 | Discharge: 2019-07-04 | DRG: 460 | Disposition: A | Discharge status: Home / Self Care | Payer: Medicare Other | Attending: Neurosurgery | Admitting: Neurosurgery

## 2019-07-03 DIAGNOSIS — M4726 Other spondylosis with radiculopathy, lumbar region: Secondary | ICD-10-CM | POA: Diagnosis not present

## 2019-07-03 DIAGNOSIS — Z79899 Other long term (current) drug therapy: Secondary | ICD-10-CM

## 2019-07-03 DIAGNOSIS — Z951 Presence of aortocoronary bypass graft: Secondary | ICD-10-CM | POA: Diagnosis not present

## 2019-07-03 DIAGNOSIS — I10 Essential (primary) hypertension: Secondary | ICD-10-CM | POA: Diagnosis present

## 2019-07-03 DIAGNOSIS — E875 Hyperkalemia: Secondary | ICD-10-CM | POA: Diagnosis not present

## 2019-07-03 DIAGNOSIS — Z7982 Long term (current) use of aspirin: Secondary | ICD-10-CM | POA: Diagnosis not present

## 2019-07-03 DIAGNOSIS — Z885 Allergy status to narcotic agent status: Secondary | ICD-10-CM | POA: Diagnosis not present

## 2019-07-03 DIAGNOSIS — Z20822 Contact with and (suspected) exposure to covid-19: Secondary | ICD-10-CM | POA: Diagnosis not present

## 2019-07-03 DIAGNOSIS — M5416 Radiculopathy, lumbar region: Secondary | ICD-10-CM | POA: Diagnosis present

## 2019-07-03 DIAGNOSIS — Z981 Arthrodesis status: Secondary | ICD-10-CM | POA: Diagnosis not present

## 2019-07-03 DIAGNOSIS — I2510 Atherosclerotic heart disease of native coronary artery without angina pectoris: Secondary | ICD-10-CM | POA: Diagnosis present

## 2019-07-03 DIAGNOSIS — Z7984 Long term (current) use of oral hypoglycemic drugs: Secondary | ICD-10-CM

## 2019-07-03 DIAGNOSIS — M48061 Spinal stenosis, lumbar region without neurogenic claudication: Secondary | ICD-10-CM | POA: Diagnosis present

## 2019-07-03 DIAGNOSIS — M21372 Foot drop, left foot: Secondary | ICD-10-CM | POA: Diagnosis not present

## 2019-07-03 DIAGNOSIS — N400 Enlarged prostate without lower urinary tract symptoms: Secondary | ICD-10-CM | POA: Diagnosis present

## 2019-07-03 DIAGNOSIS — Z87891 Personal history of nicotine dependence: Secondary | ICD-10-CM | POA: Diagnosis not present

## 2019-07-03 DIAGNOSIS — Z419 Encounter for procedure for purposes other than remedying health state, unspecified: Secondary | ICD-10-CM

## 2019-07-03 DIAGNOSIS — E119 Type 2 diabetes mellitus without complications: Secondary | ICD-10-CM | POA: Diagnosis present

## 2019-07-03 LAB — GLUCOSE, CAPILLARY
Glucose-Capillary: 146 mg/dL — ABNORMAL HIGH (ref 70–99)
Glucose-Capillary: 154 mg/dL — ABNORMAL HIGH (ref 70–99)
Glucose-Capillary: 299 mg/dL — ABNORMAL HIGH (ref 70–99)

## 2019-07-03 SURGERY — POSTERIOR LUMBAR FUSION 2 LEVEL
Anesthesia: General | Site: Spine Lumbar

## 2019-07-03 MED ORDER — FENTANYL CITRATE (PF) 250 MCG/5ML IJ SOLN
INTRAMUSCULAR | Status: DC | PRN
  Administered 2019-07-03: 100 ug via INTRAVENOUS
  Administered 2019-07-03 (×3): 50 ug via INTRAVENOUS

## 2019-07-03 MED ORDER — CELECOXIB 200 MG PO CAPS
200.0000 mg | ORAL_CAPSULE | Freq: Once | ORAL | Status: AC
  Administered 2019-07-03: 200 mg via ORAL
  Filled 2019-07-03: qty 1

## 2019-07-03 MED ORDER — PANTOPRAZOLE SODIUM 40 MG IV SOLR: 40.0000 mg | Freq: Every day | INTRAVENOUS | Status: DC

## 2019-07-03 MED ORDER — HYDROCHLOROTHIAZIDE 25 MG PO TABS
25.0000 mg | ORAL_TABLET | Freq: Every day | ORAL | Status: DC
  Administered 2019-07-04: 25 mg via ORAL
  Filled 2019-07-03: qty 1

## 2019-07-03 MED ORDER — LACTATED RINGERS IV SOLN: INTRAVENOUS | Status: DC

## 2019-07-03 MED ORDER — SODIUM CHLORIDE 0.9 % IV SOLN
250.0000 mL | INTRAVENOUS | Status: DC
  Administered 2019-07-03: 250 mL via INTRAVENOUS

## 2019-07-03 MED ORDER — PHENYLEPHRINE 40 MCG/ML (10ML) SYRINGE FOR IV PUSH (FOR BLOOD PRESSURE SUPPORT)
PREFILLED_SYRINGE | INTRAVENOUS | Status: AC
  Filled 2019-07-03: qty 10

## 2019-07-03 MED ORDER — LIDOCAINE 2% (20 MG/ML) 5 ML SYRINGE
INTRAMUSCULAR | Status: AC
  Filled 2019-07-03: qty 5

## 2019-07-03 MED ORDER — INSULIN ASPART 100 UNIT/ML ~~LOC~~ SOLN
0.0000 [IU] | Freq: Every day | SUBCUTANEOUS | Status: DC
  Administered 2019-07-03: 3 [IU] via SUBCUTANEOUS

## 2019-07-03 MED ORDER — GLYCOPYRROLATE PF 0.2 MG/ML IJ SOSY
PREFILLED_SYRINGE | INTRAMUSCULAR | Status: DC | PRN
Start: 2019-07-03 — End: 2019-07-03
  Administered 2019-07-03: .1 mg via INTRAVENOUS

## 2019-07-03 MED ORDER — BUPIVACAINE LIPOSOME 1.3 % IJ SUSP
INTRAMUSCULAR | Status: DC | PRN
  Administered 2019-07-03: 20 mL

## 2019-07-03 MED ORDER — CHLORHEXIDINE GLUCONATE 0.12 % MT SOLN
15.0000 mL | Freq: Once | OROMUCOSAL | Status: AC
  Administered 2019-07-03: 15 mL via OROMUCOSAL
  Filled 2019-07-03: qty 15

## 2019-07-03 MED ORDER — SODIUM CHLORIDE 0.9% FLUSH: 3.0000 mL | Freq: Two times a day (BID) | INTRAVENOUS | Status: DC

## 2019-07-03 MED ORDER — CHLORHEXIDINE GLUCONATE CLOTH 2 % EX PADS: 6.0000 | MEDICATED_PAD | Freq: Once | CUTANEOUS | Status: DC

## 2019-07-03 MED ORDER — ALUM & MAG HYDROXIDE-SIMETH 200-200-20 MG/5ML PO SUSP: 30.0000 mL | Freq: Four times a day (QID) | ORAL | Status: DC | PRN

## 2019-07-03 MED ORDER — METHOCARBAMOL 500 MG PO TABS
500.0000 mg | ORAL_TABLET | Freq: Four times a day (QID) | ORAL | Status: DC | PRN
  Administered 2019-07-03 – 2019-07-04 (×3): 500 mg via ORAL
  Filled 2019-07-03 (×3): qty 1

## 2019-07-03 MED ORDER — 0.9 % SODIUM CHLORIDE (POUR BTL) OPTIME
TOPICAL | Status: DC | PRN
  Administered 2019-07-03: 1000 mL

## 2019-07-03 MED ORDER — THROMBIN 20000 UNITS EX SOLR
CUTANEOUS | Status: AC
  Filled 2019-07-03: qty 20000

## 2019-07-03 MED ORDER — FLEET ENEMA 7-19 GM/118ML RE ENEM: 1.0000 | ENEMA | Freq: Once | RECTAL | Status: DC | PRN

## 2019-07-03 MED ORDER — ACETAMINOPHEN 325 MG PO TABS: 650.0000 mg | ORAL_TABLET | ORAL | Status: DC | PRN

## 2019-07-03 MED ORDER — TELMISARTAN-HCTZ 80-25 MG PO TABS: 1.0000 | ORAL_TABLET | Freq: Every day | ORAL | Status: DC

## 2019-07-03 MED ORDER — ZOLPIDEM TARTRATE 5 MG PO TABS: 5.0000 mg | ORAL_TABLET | Freq: Every evening | ORAL | Status: DC | PRN

## 2019-07-03 MED ORDER — THROMBIN 5000 UNITS EX SOLR
CUTANEOUS | Status: AC
  Filled 2019-07-03: qty 5000

## 2019-07-03 MED ORDER — PHENOL 1.4 % MT LIQD: 1.0000 | OROMUCOSAL | Status: DC | PRN

## 2019-07-03 MED ORDER — FENTANYL CITRATE (PF) 100 MCG/2ML IJ SOLN
25.0000 ug | INTRAMUSCULAR | Status: DC | PRN
  Administered 2019-07-03 (×4): 25 ug via INTRAVENOUS

## 2019-07-03 MED ORDER — ORAL CARE MOUTH RINSE: 15.0000 mL | Freq: Once | OROMUCOSAL | Status: AC

## 2019-07-03 MED ORDER — DEXAMETHASONE SODIUM PHOSPHATE 10 MG/ML IJ SOLN
INTRAMUSCULAR | Status: DC | PRN
  Administered 2019-07-03: 4 mg via INTRAVENOUS

## 2019-07-03 MED ORDER — BUPIVACAINE HCL (PF) 0.5 % IJ SOLN
INTRAMUSCULAR | Status: DC | PRN
  Administered 2019-07-03: 5 mL

## 2019-07-03 MED ORDER — PHENYLEPHRINE 40 MCG/ML (10ML) SYRINGE FOR IV PUSH (FOR BLOOD PRESSURE SUPPORT)
PREFILLED_SYRINGE | INTRAVENOUS | Status: DC | PRN
  Administered 2019-07-03: 80 ug via INTRAVENOUS
  Administered 2019-07-03 (×2): 40 ug via INTRAVENOUS
  Administered 2019-07-03: 80 ug via INTRAVENOUS
  Administered 2019-07-03: 40 ug via INTRAVENOUS

## 2019-07-03 MED ORDER — DIPHENHYDRAMINE HCL 50 MG/ML IJ SOLN
INTRAMUSCULAR | Status: AC
  Filled 2019-07-03: qty 1

## 2019-07-03 MED ORDER — VITAMIN D 25 MCG (1000 UNIT) PO TABS
1000.0000 [IU] | ORAL_TABLET | Freq: Every day | ORAL | Status: DC
  Administered 2019-07-04: 1000 [IU] via ORAL
  Filled 2019-07-03: qty 1

## 2019-07-03 MED ORDER — METHOCARBAMOL 1000 MG/10ML IJ SOLN
500.0000 mg | Freq: Four times a day (QID) | INTRAVENOUS | Status: DC | PRN
  Filled 2019-07-03: qty 5

## 2019-07-03 MED ORDER — DEXAMETHASONE SODIUM PHOSPHATE 10 MG/ML IJ SOLN
INTRAMUSCULAR | Status: AC
  Filled 2019-07-03: qty 1

## 2019-07-03 MED ORDER — FENTANYL CITRATE (PF) 250 MCG/5ML IJ SOLN
INTRAMUSCULAR | Status: AC
  Filled 2019-07-03: qty 5

## 2019-07-03 MED ORDER — ACETAMINOPHEN 650 MG RE SUPP: 650.0000 mg | RECTAL | Status: DC | PRN

## 2019-07-03 MED ORDER — SODIUM CHLORIDE 0.9% FLUSH: 3.0000 mL | INTRAVENOUS | Status: DC | PRN

## 2019-07-03 MED ORDER — OXYCODONE HCL 5 MG PO TABS
5.0000 mg | ORAL_TABLET | ORAL | Status: DC | PRN
  Administered 2019-07-03 – 2019-07-04 (×4): 5 mg via ORAL
  Filled 2019-07-03 (×4): qty 1

## 2019-07-03 MED ORDER — AMLODIPINE BESYLATE 5 MG PO TABS
5.0000 mg | ORAL_TABLET | Freq: Every day | ORAL | Status: DC
  Administered 2019-07-04: 5 mg via ORAL
  Filled 2019-07-03: qty 1

## 2019-07-03 MED ORDER — FENTANYL CITRATE (PF) 100 MCG/2ML IJ SOLN
INTRAMUSCULAR | Status: AC
  Filled 2019-07-03: qty 2

## 2019-07-03 MED ORDER — SODIUM CHLORIDE 0.9 % IV SOLN
INTRAVENOUS | Status: DC | PRN
  Administered 2019-07-03: 15 ug/min via INTRAVENOUS

## 2019-07-03 MED ORDER — CEFAZOLIN SODIUM-DEXTROSE 2-4 GM/100ML-% IV SOLN
2.0000 g | INTRAVENOUS | Status: AC
  Administered 2019-07-03: 2 g via INTRAVENOUS
  Filled 2019-07-03: qty 100

## 2019-07-03 MED ORDER — METFORMIN HCL ER 500 MG PO TB24
500.0000 mg | ORAL_TABLET | Freq: Two times a day (BID) | ORAL | Status: DC
  Administered 2019-07-03 – 2019-07-04 (×2): 500 mg via ORAL
  Filled 2019-07-03 (×2): qty 1

## 2019-07-03 MED ORDER — SUGAMMADEX SODIUM 200 MG/2ML IV SOLN
INTRAVENOUS | Status: DC | PRN
  Administered 2019-07-03: 200 mg via INTRAVENOUS

## 2019-07-03 MED ORDER — IRBESARTAN 300 MG PO TABS
300.0000 mg | ORAL_TABLET | Freq: Every day | ORAL | Status: DC
  Administered 2019-07-04: 300 mg via ORAL
  Filled 2019-07-03: qty 1

## 2019-07-03 MED ORDER — ONDANSETRON HCL 4 MG/2ML IJ SOLN: 4.0000 mg | Freq: Four times a day (QID) | INTRAMUSCULAR | Status: DC | PRN

## 2019-07-03 MED ORDER — PRAVASTATIN SODIUM 40 MG PO TABS
40.0000 mg | ORAL_TABLET | Freq: Every evening | ORAL | Status: DC
  Administered 2019-07-03: 40 mg via ORAL
  Filled 2019-07-03: qty 1

## 2019-07-03 MED ORDER — DIPHENHYDRAMINE HCL 50 MG/ML IJ SOLN
INTRAMUSCULAR | Status: DC | PRN
Start: 2019-07-03 — End: 2019-07-03
  Administered 2019-07-03: 12.5 mg via INTRAVENOUS

## 2019-07-03 MED ORDER — BISACODYL 10 MG RE SUPP: 10.0000 mg | Freq: Every day | RECTAL | Status: DC | PRN

## 2019-07-03 MED ORDER — BUPIVACAINE LIPOSOME 1.3 % IJ SUSP
20.0000 mL | Freq: Once | INTRAMUSCULAR | Status: DC
  Filled 2019-07-03: qty 20

## 2019-07-03 MED ORDER — REPAGLINIDE 1 MG PO TABS
1.0000 mg | ORAL_TABLET | Freq: Three times a day (TID) | ORAL | Status: DC
  Administered 2019-07-04: 1 mg via ORAL
  Filled 2019-07-03 (×3): qty 1

## 2019-07-03 MED ORDER — ACETAMINOPHEN 500 MG PO TABS
1000.0000 mg | ORAL_TABLET | Freq: Once | ORAL | Status: AC
  Administered 2019-07-03: 1000 mg via ORAL
  Filled 2019-07-03: qty 2

## 2019-07-03 MED ORDER — LIDOCAINE-EPINEPHRINE 1 %-1:100000 IJ SOLN
INTRAMUSCULAR | Status: DC | PRN
  Administered 2019-07-03: 5 mL

## 2019-07-03 MED ORDER — ONDANSETRON HCL 4 MG PO TABS: 4.0000 mg | ORAL_TABLET | Freq: Four times a day (QID) | ORAL | Status: DC | PRN

## 2019-07-03 MED ORDER — GLYCOPYRROLATE PF 0.2 MG/ML IJ SOSY
PREFILLED_SYRINGE | INTRAMUSCULAR | Status: AC
  Filled 2019-07-03: qty 1

## 2019-07-03 MED ORDER — ALBUMIN HUMAN 5 % IV SOLN: INTRAVENOUS | Status: DC | PRN

## 2019-07-03 MED ORDER — KCL IN DEXTROSE-NACL 20-5-0.45 MEQ/L-%-% IV SOLN: INTRAVENOUS | Status: DC

## 2019-07-03 MED ORDER — INSULIN ASPART 100 UNIT/ML ~~LOC~~ SOLN
0.0000 [IU] | Freq: Three times a day (TID) | SUBCUTANEOUS | Status: DC
  Administered 2019-07-04: 3 [IU] via SUBCUTANEOUS

## 2019-07-03 MED ORDER — DOCUSATE SODIUM 100 MG PO CAPS
100.0000 mg | ORAL_CAPSULE | Freq: Two times a day (BID) | ORAL | Status: DC
  Administered 2019-07-03 – 2019-07-04 (×2): 100 mg via ORAL
  Filled 2019-07-03 (×2): qty 1

## 2019-07-03 MED ORDER — LIDOCAINE-EPINEPHRINE 1 %-1:100000 IJ SOLN
INTRAMUSCULAR | Status: AC
  Filled 2019-07-03: qty 1

## 2019-07-03 MED ORDER — ROCURONIUM BROMIDE 10 MG/ML (PF) SYRINGE
PREFILLED_SYRINGE | INTRAVENOUS | Status: AC
  Filled 2019-07-03: qty 10

## 2019-07-03 MED ORDER — POLYETHYLENE GLYCOL 3350 17 G PO PACK: 17.0000 g | PACK | Freq: Every day | ORAL | Status: DC | PRN

## 2019-07-03 MED ORDER — PROPOFOL 10 MG/ML IV BOLUS
INTRAVENOUS | Status: DC | PRN
  Administered 2019-07-03: 120 mg via INTRAVENOUS

## 2019-07-03 MED ORDER — HYDROCODONE-ACETAMINOPHEN 5-325 MG PO TABS: 2.0000 | ORAL_TABLET | ORAL | Status: DC | PRN

## 2019-07-03 MED ORDER — THROMBIN 20000 UNITS EX SOLR
CUTANEOUS | Status: DC | PRN
  Administered 2019-07-03: 20 mL via TOPICAL

## 2019-07-03 MED ORDER — PROMETHAZINE HCL 25 MG/ML IJ SOLN: 6.2500 mg | INTRAMUSCULAR | Status: DC | PRN

## 2019-07-03 MED ORDER — LIDOCAINE 2% (20 MG/ML) 5 ML SYRINGE
INTRAMUSCULAR | Status: DC | PRN
  Administered 2019-07-03: 70 mg via INTRAVENOUS

## 2019-07-03 MED ORDER — ONDANSETRON HCL 4 MG/2ML IJ SOLN
INTRAMUSCULAR | Status: AC
  Filled 2019-07-03: qty 2

## 2019-07-03 MED ORDER — ONDANSETRON HCL 4 MG/2ML IJ SOLN
INTRAMUSCULAR | Status: DC | PRN
  Administered 2019-07-03: 4 mg via INTRAVENOUS

## 2019-07-03 MED ORDER — HYDROMORPHONE HCL 1 MG/ML IJ SOLN: 0.5000 mg | INTRAMUSCULAR | Status: DC | PRN

## 2019-07-03 MED ORDER — THROMBIN 5000 UNITS EX SOLR: OROMUCOSAL | Status: DC | PRN

## 2019-07-03 MED ORDER — ROCURONIUM BROMIDE 10 MG/ML (PF) SYRINGE
PREFILLED_SYRINGE | INTRAVENOUS | Status: DC | PRN
  Administered 2019-07-03: 10 mg via INTRAVENOUS
  Administered 2019-07-03: 70 mg via INTRAVENOUS
  Administered 2019-07-03: 20 mg via INTRAVENOUS

## 2019-07-03 MED ORDER — CEFAZOLIN SODIUM-DEXTROSE 2-4 GM/100ML-% IV SOLN
2.0000 g | Freq: Three times a day (TID) | INTRAVENOUS | Status: AC
  Administered 2019-07-03 – 2019-07-04 (×2): 2 g via INTRAVENOUS
  Filled 2019-07-03 (×2): qty 100

## 2019-07-03 MED ORDER — BUPIVACAINE HCL (PF) 0.5 % IJ SOLN
INTRAMUSCULAR | Status: AC
  Filled 2019-07-03: qty 30

## 2019-07-03 MED ORDER — MENTHOL 3 MG MT LOZG: 1.0000 | LOZENGE | OROMUCOSAL | Status: DC | PRN

## 2019-07-03 MED ORDER — EPHEDRINE SULFATE 50 MG/ML IJ SOLN
INTRAMUSCULAR | Status: DC | PRN
  Administered 2019-07-03: 10 mg via INTRAVENOUS

## 2019-07-03 MED ORDER — PANTOPRAZOLE SODIUM 40 MG PO TBEC
40.0000 mg | DELAYED_RELEASE_TABLET | Freq: Every day | ORAL | Status: DC
  Administered 2019-07-03: 40 mg via ORAL
  Filled 2019-07-03: qty 1

## 2019-07-03 SURGICAL SUPPLY — 76 items
ADH SKN CLS APL DERMABOND .7 (GAUZE/BANDAGES/DRESSINGS) ×1
BASKET BONE COLLECTION (BASKET) ×3 IMPLANT
BLADE CLIPPER SURG (BLADE) ×3 IMPLANT
BUR MATCHSTICK NEURO 3.0 LAGG (BURR) ×3 IMPLANT
BUR PRECISION FLUTE 5.0 (BURR) ×3 IMPLANT
CANISTER SUCT 3000ML PPV (MISCELLANEOUS) ×3 IMPLANT
CARTRIDGE OIL MAESTRO DRILL (MISCELLANEOUS) ×1 IMPLANT
CNTNR URN SCR LID CUP LEK RST (MISCELLANEOUS) ×2 IMPLANT
CONT SPEC 4OZ STRL OR WHT (MISCELLANEOUS) ×6
COVER BACK TABLE 60X90IN (DRAPES) ×3 IMPLANT
COVER WAND RF STERILE (DRAPES) ×3 IMPLANT
DECANTER SPIKE VIAL GLASS SM (MISCELLANEOUS) ×3 IMPLANT
DERMABOND ADVANCED (GAUZE/BANDAGES/DRESSINGS) ×2
DERMABOND ADVANCED .7 DNX12 (GAUZE/BANDAGES/DRESSINGS) ×1 IMPLANT
DIFFUSER DRILL AIR PNEUMATIC (MISCELLANEOUS) ×3 IMPLANT
DRAPE C-ARM 42X72 X-RAY (DRAPES) ×3 IMPLANT
DRAPE C-ARMOR (DRAPES) ×3 IMPLANT
DRAPE LAPAROTOMY 100X72X124 (DRAPES) ×3 IMPLANT
DRAPE SURG 17X23 STRL (DRAPES) ×3 IMPLANT
DRSG OPSITE POSTOP 4X6 (GAUZE/BANDAGES/DRESSINGS) ×3 IMPLANT
DRSG OPSITE POSTOP 4X8 (GAUZE/BANDAGES/DRESSINGS) ×3 IMPLANT
DURAPREP 26ML APPLICATOR (WOUND CARE) ×3 IMPLANT
ELECT REM PT RETURN 9FT ADLT (ELECTROSURGICAL) ×3
ELECTRODE REM PT RTRN 9FT ADLT (ELECTROSURGICAL) ×1 IMPLANT
EVACUATOR 1/8 PVC DRAIN (DRAIN) IMPLANT
GAUZE 4X4 16PLY RFD (DISPOSABLE) IMPLANT
GAUZE SPONGE 4X4 12PLY STRL (GAUZE/BANDAGES/DRESSINGS) ×3 IMPLANT
GLOVE BIO SURGEON STRL SZ8 (GLOVE) ×6 IMPLANT
GLOVE BIOGEL PI IND STRL 7.5 (GLOVE) ×3 IMPLANT
GLOVE BIOGEL PI IND STRL 8 (GLOVE) ×3 IMPLANT
GLOVE BIOGEL PI IND STRL 8.5 (GLOVE) ×2 IMPLANT
GLOVE BIOGEL PI INDICATOR 7.5 (GLOVE) ×6
GLOVE BIOGEL PI INDICATOR 8 (GLOVE) ×6
GLOVE BIOGEL PI INDICATOR 8.5 (GLOVE) ×4
GLOVE ECLIPSE 7.0 STRL STRAW (GLOVE) ×3 IMPLANT
GLOVE ECLIPSE 8.0 STRL XLNG CF (GLOVE) ×6 IMPLANT
GLOVE EXAM NITRILE XL STR (GLOVE) IMPLANT
GLOVE SS BIOGEL STRL SZ 7 (GLOVE) ×3 IMPLANT
GLOVE SUPERSENSE BIOGEL SZ 7 (GLOVE) ×6
GOWN STRL REUS W/ TWL LRG LVL3 (GOWN DISPOSABLE) ×2 IMPLANT
GOWN STRL REUS W/ TWL XL LVL3 (GOWN DISPOSABLE) ×4 IMPLANT
GOWN STRL REUS W/TWL 2XL LVL3 (GOWN DISPOSABLE) ×3 IMPLANT
GOWN STRL REUS W/TWL LRG LVL3 (GOWN DISPOSABLE) ×6
GOWN STRL REUS W/TWL XL LVL3 (GOWN DISPOSABLE) ×12
HEMOSTAT POWDER KIT SURGIFOAM (HEMOSTASIS) ×3 IMPLANT
KIT BASIN OR (CUSTOM PROCEDURE TRAY) ×3 IMPLANT
KIT POSITION SURG JACKSON T1 (MISCELLANEOUS) ×3 IMPLANT
KIT TURNOVER KIT B (KITS) ×3 IMPLANT
MILL MEDIUM DISP (BLADE) ×3 IMPLANT
NEEDLE HYPO 21X1.5 SAFETY (NEEDLE) ×3 IMPLANT
NEEDLE HYPO 25X1 1.5 SAFETY (NEEDLE) ×3 IMPLANT
NEEDLE SPNL 18GX3.5 QUINCKE PK (NEEDLE) ×3 IMPLANT
NS IRRIG 1000ML POUR BTL (IV SOLUTION) ×3 IMPLANT
OIL CARTRIDGE MAESTRO DRILL (MISCELLANEOUS) ×3
PACK LAMINECTOMY NEURO (CUSTOM PROCEDURE TRAY) ×3 IMPLANT
PAD ARMBOARD 7.5X6 YLW CONV (MISCELLANEOUS) ×9 IMPLANT
PATTIES SURGICAL .5 X.5 (GAUZE/BANDAGES/DRESSINGS) IMPLANT
PATTIES SURGICAL .5 X1 (DISPOSABLE) IMPLANT
PATTIES SURGICAL 1X1 (DISPOSABLE) IMPLANT
ROD RELIN-O LORD 5.5X65MM (Rod) ×6 IMPLANT
SCREW LOCK RELINE 5.5 TULIP (Screw) ×18 IMPLANT
SCREW RELINE-O POLY 6.5X45 (Screw) ×12 IMPLANT
SCREW RELINE-O POLY 6.5X50MM (Screw) ×6 IMPLANT
SPONGE LAP 4X18 RFD (DISPOSABLE) IMPLANT
SPONGE SURGIFOAM ABS GEL 100 (HEMOSTASIS) ×3 IMPLANT
STAPLER SKIN PROX WIDE 3.9 (STAPLE) ×3 IMPLANT
SUT VIC AB 1 CT1 18XBRD ANBCTR (SUTURE) ×2 IMPLANT
SUT VIC AB 1 CT1 8-18 (SUTURE) ×6
SUT VIC AB 2-0 CT1 18 (SUTURE) ×6 IMPLANT
SUT VIC AB 3-0 SH 8-18 (SUTURE) ×6 IMPLANT
SYR 20ML LL LF (SYRINGE) ×3 IMPLANT
SYR 5ML LL (SYRINGE) IMPLANT
TOWEL GREEN STERILE (TOWEL DISPOSABLE) ×3 IMPLANT
TOWEL GREEN STERILE FF (TOWEL DISPOSABLE) ×3 IMPLANT
TRAY FOLEY MTR SLVR 16FR STAT (SET/KITS/TRAYS/PACK) ×3 IMPLANT
WATER STERILE IRR 1000ML POUR (IV SOLUTION) ×3 IMPLANT

## 2019-07-03 NOTE — Progress Notes (Signed)
Orthopedic Tech Progress Note Patient Details:  Jeff Burgess Feb 25, 1935 [id number] Patient already has brace. Patient ID: Jeff Burgess, male   DOB: [birth year], 84 y.o.   MRN: [id number]   [name] 07/03/2019, 8:38 PM

## 2019-07-03 NOTE — Anesthesia Postprocedure Evaluation (Signed)
Anesthesia Post Note  Patient: Jeff Burgess  Procedure(s) Performed: LUMBAR FOUR-FIVE, LUMBAR FIVE-SACRAL ONE DECOMPRESSION AND FIXATION (N/A Spine Lumbar)     Patient location during evaluation: PACU Anesthesia Type: General Level of consciousness: sedated Pain management: pain level controlled Vital Signs Assessment: post-procedure vital signs reviewed and stable Respiratory status: spontaneous breathing and respiratory function stable Cardiovascular status: stable Postop Assessment: no apparent nausea or vomiting Anesthetic complications: no   No complications documented.  Last Vitals:  Vitals:   07/03/19 1640 07/03/19 1655  BP: 134/67 (!) 129/59  Pulse: 86 77  Resp: 19 18  Temp:  36.5 C  SpO2: 93% 95%    Last Pain:  Vitals:   07/03/19 1655  TempSrc:   PainSc: 3                  Mehreen Azizi DANIEL

## 2019-07-03 NOTE — Brief Op Note (Signed)
07/03/2019  4:24 PM  PATIENT:  Jeff Burgess  84 y.o. male  PRE-OPERATIVE DIAGNOSIS:  degenerative lumbar spinal stenosis with severe foraminal stenosis L 45 and L 5 S 1 levels, sagittal plane imbalance, lumbar radiculopathy (partial foot drop), lumbago L 45 and L 5 S 1 levels  POST-OPERATIVE DIAGNOSIS:   degenerative lumbar spinal stenosis with severe foraminal stenosis L 45 and L 5 S 1 levels, sagittal plane imbalance, lumbar radiculopathy (partial foot drop), lumbago L 45 and L 5 S 1 levels  PROCEDURE:  Procedure(s): LUMBAR FOUR-FIVE, LUMBAR FIVE-SACRAL ONE DECOMPRESSION AND FIXATION (N/A)  With pedicle screw fixation and posterolateral artthodesis  SURGEON:  Surgeon(s) and Role:    Erline Levine, MD - Primary    Consuella Lose, MD - Assisting  PHYSICIAN ASSISTANT:   ASSISTANTS: Poteat, RN   Glenford Peers, NP   ANESTHESIA:   general  EBL:  150 mL   BLOOD ADMINISTERED:none  DRAINS: none   LOCAL MEDICATIONS USED:  MARCAINE    and LIDOCAINE   SPECIMEN:  No Specimen  DISPOSITION OF SPECIMEN:  N/A  COUNTS:  YES  TOURNIQUET:  * No tourniquets in log *  DICTATION: Patient is 84 year old man with  HNP, spondylosis, severe foraminal stenosis, L 45 and L 5 S 1 levels,  stenosis, DDD, radiculopathy L4/5, L5/S1 with sagittal plane imbalance (flat back). He has a bilateral L 5 radiculopathies with partial footdrop on the right and weakness. It was elected to take him to surgery for decompression  at L4/5 and L5/S1 levels.  After extensive decompression requiring disarticulation of L 45 and L 5 S 1 facet joints, it was felt that the patient's spine had been made unstable, so at that point, I elected to perform a fusion with pedicle screw fixation and posterolateral arthrodesis.    Procedure: Patient was placed in a prone position on the [place] table after smooth and uncomplicated induction of general endotracheal anesthesia. His low back was prepped and draped in usual sterile  fashion with betadine scrub and DuraPrep. Area of incision was infiltrated with local lidocaine. Incision was made to the lumbodorsal fascia was incised and exposure was performed of the L4/5, L5/S1 spinous processes laminae facet joint and transverse processes. Intraoperative x-ray was obtained which confirmed correct orientation. A total laminectomy of L4 and L5 was performed to decompress the neural elements.  While the central canal was well decompressed, the bilateral L 4 and L 5 and S 1 nerve roots did not, at this point, seem to be sufficiently decompressed, so I elected to disarticulate the facet joints at these levels and to more completely decompress the exiting nerve roots.  I therefore performed  disarticulation of the facet joints at this level and thorough decompression was performed of both L4, L5 and S1 nerve roots along with the common dural tube. There was densely adherent spondylytic material compressing the thecal sac and both L4,  L5, and S 1 nerve roots. I initially hoped to place interbody prostheses to regain foraminal height, but the L 45 and L 5 S 1 levels appeared to be ankylosed, so I did not place interbody prostheses.  At this point, I elected to more thoroughly decompress the exiting nerve roots at each level and removed any remaining bone and ligament I could from the exiting nerve roots.   Decompression was greater than would be typically performed for simple interbody fusion. I performed the same extent of decompression at the L 5 S 1 level as well.  While I had now completely decompressed the affected nerve roots, the patient's spine had clearly been made unstable, so I elected to perform a posterior fusion with pedicle screw fixation and posterolateral arthrodesis.   The posterolateral region was extensively decorticated and pedicle probes were placed at L4, L5 and S1 bilaterally. Intraoperative fluoroscopy confirmed correct orientationin the AP and lateral plane. 45 x 6.5 mm  pedicle screws were placed at S1 bilaterally and 45 x 6.5 mm screws placed at L5 bilaterally and 50 x 6.107mm screws were placed at L4 bilaterally.  Final x-rays demonstrated well-positioned interbody grafts and pedicle screw fixation. 65 mm lordotic rods were placed and locked down in situ and the posterolateral region was packed with 30 cc  bone autograft graft  on the right and a similar amount  on the left. Long-acting Marcaine was injected in the deep musculature.   Fascia was closed with 1 Vicryl sutures skin edges were reapproximated 2 and 3-0 Vicryl sutures. The wound is dressed with Dermabond and an occlusive dressing. The patient was extubated in the operating room and taken to recovery in stable satisfactory condition having tolerated the operation well. Counts were correct at the end of the case.    PLAN OF CARE: Admit to inpatient   PATIENT DISPOSITION:  PACU - hemodynamically stable.   Delay start of Pharmacological VTE agent (>24hrs) due to surgical blood loss or risk of bleeding: yes

## 2019-07-03 NOTE — Interval H&P Note (Signed)
History and Physical Interval Note:  07/03/2019 11:36 AM  Jeff Burgess  has presented today for surgery, with the diagnosis of degenerative lumbar spinal stenosis.  The various methods of treatment have been discussed with the patient and family. After consideration of risks, benefits and other options for treatment, the patient has consented to  Procedure(s): Lumbar 4-5 Lumbar 5 Sacral 1 Decompression with Possible Fusion (N/A) as a surgical intervention.  The patient's history has been reviewed, patient examined, no change in status, stable for surgery.  I have reviewed the patient's chart and labs.  Questions were answered to the patient's satisfaction.     Peggyann Shoals

## 2019-07-03 NOTE — Progress Notes (Signed)
Pt. Stated he didn't quarantine after he was swabbed for COVID. Stated he went to a gender reveal part and has gone out to eat.Pt . Reports he has been vaccinated. Dr. Tobias Alexander aware.

## 2019-07-03 NOTE — Op Note (Signed)
07/03/2019  4:24 PM  PATIENT:  Jeff Burgess  84 y.o. male  PRE-OPERATIVE DIAGNOSIS:  degenerative lumbar spinal stenosis with severe foraminal stenosis L 45 and L 5 S 1 levels, sagittal plane imbalance, lumbar radiculopathy (partial foot drop), lumbago L 45 and L 5 S 1 levels  POST-OPERATIVE DIAGNOSIS:   degenerative lumbar spinal stenosis with severe foraminal stenosis L 45 and L 5 S 1 levels, sagittal plane imbalance, lumbar radiculopathy (partial foot drop), lumbago L 45 and L 5 S 1 levels  PROCEDURE:  Procedure(s): LUMBAR FOUR-FIVE, LUMBAR FIVE-SACRAL ONE DECOMPRESSION AND FIXATION (N/A)  With pedicle screw fixation and posterolateral artthodesis  SURGEON:  Surgeon(s) and Role:    Erline Levine, MD - Primary    Consuella Lose, MD - Assisting  PHYSICIAN ASSISTANT:   ASSISTANTS: Poteat, RN   Glenford Peers, NP   ANESTHESIA:   general  EBL:  150 mL   BLOOD ADMINISTERED:none  DRAINS: none   LOCAL MEDICATIONS USED:  MARCAINE    and LIDOCAINE   SPECIMEN:  No Specimen  DISPOSITION OF SPECIMEN:  N/A  COUNTS:  YES  TOURNIQUET:  * No tourniquets in log *  DICTATION: Patient is 84 year old man with  HNP, spondylosis, severe foraminal stenosis, L 45 and L 5 S 1 levels,  stenosis, DDD, radiculopathy L4/5, L5/S1 with sagittal plane imbalance (flat back). He has a bilateral L 5 radiculopathies with partial footdrop on the right and weakness. It was elected to take him to surgery for decompression  at L4/5 and L5/S1 levels.  After extensive decompression requiring disarticulation of L 45 and L 5 S 1 facet joints, it was felt that the patient's spine had been made unstable, so at that point, I elected to perform a fusion with pedicle screw fixation and posterolateral arthrodesis.    Procedure: Patient was placed in a prone position on the [place] table after smooth and uncomplicated induction of general endotracheal anesthesia. His low back was prepped and draped in usual sterile  fashion with betadine scrub and DuraPrep. Area of incision was infiltrated with local lidocaine. Incision was made to the lumbodorsal fascia was incised and exposure was performed of the L4/5, L5/S1 spinous processes laminae facet joint and transverse processes. Intraoperative x-ray was obtained which confirmed correct orientation. A total laminectomy of L4 and L5 was performed to decompress the neural elements.  While the central canal was well decompressed, the bilateral L 4 and L 5 and S 1 nerve roots did not, at this point, seem to be sufficiently decompressed, so I elected to disarticulate the facet joints at these levels and to more completely decompress the exiting nerve roots.  I therefore performed  disarticulation of the facet joints at this level and thorough decompression was performed of both L4, L5 and S1 nerve roots along with the common dural tube. There was densely adherent spondylytic material compressing the thecal sac and both L4,  L5, and S 1 nerve roots. I initially hoped to place interbody prostheses to regain foraminal height, but the L 45 and L 5 S 1 levels appeared to be ankylosed, so I did not place interbody prostheses.  At this point, I elected to more thoroughly decompress the exiting nerve roots at each level and removed any remaining bone and ligament I could from the exiting nerve roots.   Decompression was greater than would be typically performed for simple interbody fusion. I performed the same extent of decompression at the L 5 S 1 level as well.  While I had now completely decompressed the affected nerve roots, the patient's spine had clearly been made unstable, so I elected to perform a posterior fusion with pedicle screw fixation and posterolateral arthrodesis.   The posterolateral region was extensively decorticated and pedicle probes were placed at L4, L5 and S1 bilaterally. Intraoperative fluoroscopy confirmed correct orientationin the AP and lateral plane. 45 x 6.5 mm  pedicle screws were placed at S1 bilaterally and 45 x 6.5 mm screws placed at L5 bilaterally and 50 x 6.39mm screws were placed at L4 bilaterally.  Final x-rays demonstrated well-positioned interbody grafts and pedicle screw fixation. 65 mm lordotic rods were placed and locked down in situ and the posterolateral region was packed with 30 cc  bone autograft graft  on the right and a similar amount  on the left. Long-acting Marcaine was injected in the deep musculature.   Fascia was closed with 1 Vicryl sutures skin edges were reapproximated 2 and 3-0 Vicryl sutures. The wound is dressed with Dermabond and an occlusive dressing. The patient was extubated in the operating room and taken to recovery in stable satisfactory condition having tolerated the operation well. Counts were correct at the end of the case.    PLAN OF CARE: Admit to inpatient   PATIENT DISPOSITION:  PACU - hemodynamically stable.   Delay start of Pharmacological VTE agent (>24hrs) due to surgical blood loss or risk of bleeding: yes

## 2019-07-03 NOTE — Anesthesia Preprocedure Evaluation (Addendum)
Anesthesia Evaluation  Patient identified by MRN, date of birth, ID band Patient awake    Reviewed: Allergy & Precautions, NPO status , Patient's Chart, lab work & pertinent test results  History of Anesthesia Complications Negative for: history of anesthetic complications  Airway Mallampati: I  TM Distance: >3 FB Neck ROM: Full    Dental  (+) Edentulous Upper, Edentulous Lower, Dental Advisory Given   Pulmonary neg pulmonary ROS, former smoker,    Pulmonary exam normal        Cardiovascular hypertension, Pt. on medications + CAD and + CABG  Normal cardiovascular exam  ETT 08/09/14:  There was no ST segment deviation noted during stress. ETT with fair exercise tolerance; no chest pain; normal BP response; no ST changes (baseline RBBB); negative adequate ETT.    Neuro/Psych negative neurological ROS     GI/Hepatic negative GI ROS, Neg liver ROS,   Endo/Other  diabetes  Renal/GU negative Renal ROSBladder Ca, BPH     Musculoskeletal negative musculoskeletal ROS (+)   Abdominal   Peds  Hematology negative hematology ROS (+)   Anesthesia Other Findings   Reproductive/Obstetrics                            Anesthesia Physical Anesthesia Plan  ASA: III  Anesthesia Plan: General   Post-op Pain Management:    Induction: Intravenous  PONV Risk Score and Plan: 3 and Ondansetron, Dexamethasone and Diphenhydramine  Airway Management Planned: Oral ETT  Additional Equipment:   Intra-op Plan:   Post-operative Plan: Extubation in OR  Informed Consent: I have reviewed the patients History and Physical, chart, labs and discussed the procedure including the risks, benefits and alternatives for the proposed anesthesia with the patient or authorized representative who has indicated his/her understanding and acceptance.     Dental advisory given  Plan Discussed with:  Anesthesiologist  Anesthesia Plan Comments: Vertell Limber did request surgical clearance from Dr. Alain Marion. Patient seen on 06/25/19. He notes that HTN, CAD, dyslipidemia are "all stable". )       Anesthesia Quick Evaluation

## 2019-07-03 NOTE — Anesthesia Procedure Notes (Signed)
Procedure Name: Intubation Date/Time: 07/03/2019 12:00 PM Performed by: Wilburn Cornelia, CRNA Pre-anesthesia Checklist: Patient identified, Emergency Drugs available, Suction available, Patient being monitored and Timeout performed Patient Re-evaluated:Patient Re-evaluated prior to induction Oxygen Delivery Method: Circle system utilized Preoxygenation: Pre-oxygenation with 100% oxygen Induction Type: IV induction Ventilation: Mask ventilation without difficulty and Oral airway inserted - appropriate to patient size Laryngoscope Size: Mac and 4 Grade View: Grade I Tube type: Oral Tube size: 7.5 mm Number of attempts: 1 Airway Equipment and Method: Stylet Placement Confirmation: ETT inserted through vocal cords under direct vision,  positive ETCO2,  CO2 detector and breath sounds checked- equal and bilateral Secured at: 23 cm Tube secured with: Tape Dental Injury: Teeth and Oropharynx as per pre-operative assessment

## 2019-07-03 NOTE — Transfer of Care (Signed)
Immediate Anesthesia Transfer of Care Note  Patient: Jeff Burgess  Procedure(s) Performed: LUMBAR FOUR-FIVE, LUMBAR FIVE-SACRAL ONE DECOMPRESSION AND FIXATION (N/A Spine Lumbar)  Patient Location: PACU  Anesthesia Type:General  Level of Consciousness: awake, drowsy and patient cooperative  Airway & Oxygen Therapy: Patient Spontanous Breathing and Patient connected to nasal cannula oxygen  Post-op Assessment: Report given to RN and Post -op Vital signs reviewed and stable  Post vital signs: Reviewed and stable  Last Vitals:  Vitals Value Taken Time  BP 145/82 07/03/19 1555  Temp    Pulse 86 07/03/19 1602  Resp 18 07/03/19 1602  SpO2 97 % 07/03/19 1602  Vitals shown include unvalidated device data.  Last Pain:  Vitals:   07/03/19 1555  TempSrc:   PainSc: 0-No pain      Patients Stated Pain Goal: 4 (17/71/16 5790)  Complications: No complications documented.

## 2019-07-03 NOTE — Progress Notes (Signed)
Awake, alert, conversant.  Right foot drop and left DF weakness are markedly improved.  Patient is doing well.  He has an anticipated amount of incisional low back pain.

## 2019-07-04 ENCOUNTER — Telehealth: Payer: Self-pay | Admitting: *Deleted

## 2019-07-04 LAB — GLUCOSE, CAPILLARY: Glucose-Capillary: 155 mg/dL — ABNORMAL HIGH (ref 70–99)

## 2019-07-04 MED ORDER — OXYCODONE HCL 5 MG PO TABS: 5.0000 mg | ORAL_TABLET | ORAL | 0 refills | Status: DC | PRN

## 2019-07-04 MED ORDER — HYDROCODONE-ACETAMINOPHEN 5-325 MG PO TABS: 2.0000 | ORAL_TABLET | ORAL | 0 refills | Status: DC | PRN

## 2019-07-04 MED ORDER — METHOCARBAMOL 500 MG PO TABS: 500.0000 mg | ORAL_TABLET | Freq: Four times a day (QID) | ORAL | 1 refills | Status: DC | PRN

## 2019-07-04 NOTE — Discharge Instructions (Signed)
Wound Care Remove outer dressing in 3 days Leave incision open to air. You may shower. Do not scrub directly on incision.  Do not put any creams, lotions, or ointments on incision. Activity Walk each and every day, increasing distance each day. No lifting greater than 5 lbs.  Avoid bending, arching, and twisting. No driving for 2 weeks; may ride as a passenger locally. If provided with back brace, wear when out of bed.  It is not necessary to wear in bed. Diet Resume your normal diet.  Return to Work Will be discussed at you follow up appointment. Call Your Doctor If Any of These Occur Redness, drainage, or swelling at the wound.  Temperature greater than 101 degrees. Severe pain not relieved by pain medication. Incision starts to come apart. Follow Up Appt Call today for appointment in 3 weeks ([phone]) or for problems.  If you have any hardware placed in your spine, you will need an x-ray before your appointment.

## 2019-07-04 NOTE — Plan of Care (Signed)
Patient alert and oriented, mae's well, voiding adequate amount of urine, swallowing without difficulty, no c/o pain at time of discharge. Patient discharged home with family. Script and discharged instructions given to patient. Patient and family stated understanding of instructions given. Patient has an appointment with Dr.Stern    

## 2019-07-04 NOTE — Evaluation (Signed)
Occupational Therapy Evaluation Patient Details Name: Jeff Burgess MRN: [id number] DOB: Dec 17, 1935 Today's Date: 07/04/2019    History of Present Illness Pt is an 84 y/o male who presents s/p L4-S1 PLIF on 07/03/2019. PMH significant for CAD s/p CABG, CKD, DM II, HLD, HTN, OA.   Clinical Impression   Prior to hospital admission, patient was living in a private residence with his wife and was independent with all BADLs at baseline. Over the last few weeks prior to surgery, patient noted LB bathing/dressing had become a hardship secondary to pain. Patient currently reports decreased pain in low back and demonstrates near baseline level of function requiring grossly supervision A for functional transfers with use of RW, set-up assist for UB BADLs, and supervision to Min A with LB BADLs with use of AE. OT provided education on safety with self-care tasks while adhering to back precautions, acquisition and use of AE/DME, and energy conservation with patient demonstrating understanding with teach back method. Patient does not require continued acute OT services at this time with OT to sign off. No follow-up OT recommendations post d/c with patient to return home with wife who is able to provide 24 hour supervision/asssit.     Follow Up Recommendations  No OT follow up;Supervision/Assistance - 24 hour    Equipment Recommendations  3 in 1 bedside commode    Recommendations for Other Services       Precautions / Restrictions Precautions Precautions: Fall;Back Precaution Booklet Issued: Yes (comment) Precaution Comments: Reviewed precautions during functional mobility.  Required Braces or Orthoses: Spinal Brace Spinal Brace: Lumbar corset;Applied in sitting position Restrictions Weight Bearing Restrictions: No      Mobility Bed Mobility Overal bed mobility: Needs Assistance Bed Mobility: Supine to Sit;Rolling Rolling: Supervision   Supine to sit: Supervision     General bed mobility  comments: Cues for log rolling technique and increased time.   Transfers Overall transfer level: Needs assistance Equipment used: Rolling walker (2 wheeled) Transfers: Sit to/from Stand Sit to Stand: Supervision;From elevated surface         General transfer comment: No assist required. Pt was cued for hand placement on seated surface for safety.     Balance Overall balance assessment: No apparent balance deficits (not formally assessed)                                         ADL either performed or assessed with clinical judgement   ADL Overall ADL's : Needs assistance/impaired     Grooming: Supervision/safety;Standing   Upper Body Bathing: Supervision/ safety;Sitting   Lower Body Bathing: Minimal assistance;Sit to/from stand   Upper Body Dressing : Set up;Sitting   Lower Body Dressing: Min guard;Sit to/from stand Lower Body Dressing Details (indicate cue type and reason): Min guard to steady in standing Toilet Transfer: Min Psychiatric nurse Details (indicate cue type and reason): Standard commode with use of grab bar         Functional mobility during ADLs: Supervision/safety;Min guard General ADL Comments: Mobility with Min guard without use of DME, supervision A with RW     Vision Baseline Vision/History: Wears glasses Wears Glasses: At all times Patient Visual Report: No change from baseline Vision Assessment?: No apparent visual deficits     Perception     Praxis      Pertinent Vitals/Pain Pain Assessment: Faces Faces Pain Scale: Hurts a little bit Pain  Location: Incision site Pain Descriptors / Indicators: Operative site guarding Pain Intervention(s): Monitored during session     Hand Dominance     Extremity/Trunk Assessment Upper Extremity Assessment Upper Extremity Assessment: Overall WFL for tasks assessed   Lower Extremity Assessment Lower Extremity Assessment: Defer to PT evaluation LLE Deficits / Details: Foot  slap noted during ambulation. Pt reports it is improved since prior to surgery.   Cervical / Trunk Assessment Cervical / Trunk Assessment: Other exceptions Cervical / Trunk Exceptions: s/p surgery   Communication Communication Communication: No difficulties   Cognition Arousal/Alertness: Awake/alert Behavior During Therapy: WFL for tasks assessed/performed Overall Cognitive Status: Within Functional Limits for tasks assessed                                     General Comments       Exercises     Shoulder Instructions      Home Living Family/patient expects to be discharged to:: Private residence Living Arrangements: Spouse/significant other Available Help at Discharge: Family;Available 24 hours/day Type of Home: House Home Access: Stairs to enter [employer] of Steps: 5 Entrance Stairs-Rails: Right;Left Home Layout: One level     Bathroom Shower/Tub: Occupational psychologist: Handicapped height Bathroom Accessibility: Yes How Accessible: Accessible via walker Home Equipment: [place] - 4 wheels;Cane - quad;Shower seat          Prior Functioning/Environment Level of Independence: Independent with assistive device(s)        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  OT Frequency:     Barriers to D/C:            Co-evaluation              AM-PAC OT "6 Clicks" Daily Activity     Outcome Measure Help from another person eating meals?: None Help from another person taking care of personal grooming?: A Little Help from another person toileting, which includes using toliet, bedpan, or urinal?: A Little Help from another person bathing (including washing,  rinsing, drying)?: A Little Help from another person to put on and taking off regular upper body clothing?: None Help from another person to put on and taking off regular lower body clothing?: A Little 6 Click Score: 20   End of Session Equipment Utilized During Treatment: Rolling walker  Activity Tolerance: Patient tolerated treatment well Patient left: in bed;with call bell/phone within reach  OT Visit Diagnosis: Unsteadiness on feet (R26.81);Pain Pain - part of body:  (Low back with movement)                Time: 6256-3893 OT Time Calculation (min): 29 min Charges:  OT General Charges $OT Visit: 1 Visit OT Evaluation $OT Eval Low Complexity: 1 Low OT Treatments $Self Care/Home Management : 8-22 mins  Cerrone Debold H. OTR/L Supplemental OT, Department of rehab services [phone]  Anyiah Coverdale R H. 07/04/2019, 10:09 AM

## 2019-07-04 NOTE — Telephone Encounter (Signed)
Pt was on TCM report admitted 07/03/19 for degenerative lumbar spinal stenosis with severe foraminal stenosis L 45, and L 5 S 1 levels, sagittal plane imbalance, lumbar radiculopathy (partial foot drop), lumbago L 45 and L 5 S 1 levels. Pt underwent LUMBAR FOUR-FIVE, LUMBAR FIVE-SACRAL ONE DECOMPRESSION AND FIXATION (N/A) With pedicle screw fixation and posterolateral arthrodesis. Pt tolerated procedure well, and will f/u w/surgeon in 2 weeks.Marland KitchenJohny Chess

## 2019-07-04 NOTE — Discharge Summary (Addendum)
Physician Discharge Summary  Patient ID: Jeff Burgess MRN: [id number] DOB/AGE: May 12, 1935 84 y.o.  Admit date: 07/03/2019 Discharge date: 07/04/2019  Admission Diagnoses: degenerative lumbar spinal stenosis with severe foraminal stenosis L 45 and L 5 S 1 levels, sagittal plane imbalance, lumbar radiculopathy (partial foot drop), lumbago L 45 and L 5 S 1 levels    Discharge Diagnoses: degenerative lumbar spinal stenosis with severe foraminal stenosis L 45 and L 5 S 1 levels, sagittal plane imbalance, lumbar radiculopathy (partial foot drop), lumbago L 45 and L 5 S 1 levels  S/p LUMBAR FOUR-FIVE, LUMBAR FIVE-SACRAL ONE DECOMPRESSION AND FIXATION (N/A) With pedicle screw fixation and posterolateral artthodesis    Active Problems:   Lumbar foraminal stenosis   Discharged Condition: good  Hospital Course: Jeff Burgess was admitted for surgery with dx lumbar stenosis with radiculopathy and partial foot drop.  Following uncomplicated surgery (above), he recovered nicely and transferred to [facility] for nursing care and therapies. He is mobilizing well with improved strength.  Consults: None  Significant Diagnostic Studies: radiology: X-Ray: intra-op  Treatments: surgery: LUMBAR FOUR-FIVE, LUMBAR FIVE-SACRAL ONE DECOMPRESSION AND FIXATION (N/A) With pedicle screw fixation and posterolateral artthodesis    Discharge Exam: Blood pressure 120/65, pulse 79, temperature 97.7 F (36.5 C), temperature source Oral, resp. rate 16, height 5\' 9"  (1.753 m), weight 83.9 kg, SpO2 94 %. Alert, conversant, reporting only lumbar soreness. Denies leg pain, numbness, tingling. Good strength BLE (pre-op LLE weakness much improved). Incision without erythema, swelling or drainage beneath honeycomb and Dermabond.    Disposition:  Discharge to home. Pt verbalizes understanding of d/c instructions and agrees to call office to schedule 3 week post-op appt. Oxycodone 5mg  and Robaxin 500mg  will be  eRx'ed for prn home use.    Discharge Instructions     Remove dressing in 72 hours   Complete by: As directed    Diet - low sodium heart healthy   Complete by: As directed    Increase activity slowly   Complete by: As directed      Allergies as of 07/04/2019      Reactions   Coreg [carvedilol] Other (See Comments)   weakness   Lovastatin    Leg weakness   Morphine Sulfate Other (See Comments)   Per the pt" after taking morphine within 30 mins I was soaking wet, I did not like the way it made me feel"      Medication List    TAKE these medications   amLODipine 5 MG tablet Commonly known as: NORVASC Take 1 tablet (5 mg total) by mouth daily.   aspirin 81 MG tablet Take 2 tablets (162 mg total) by mouth daily. What changed:   how much to take  when to take this   cholecalciferol 25 MCG (1000 UNIT) tablet Commonly known as: VITAMIN D Take 1 tablet (1,000 Units total) by mouth every morning. Follow-up due in April must see provider for refills What changed: when to take this   HYDROcodone-acetaminophen 5-325 MG tablet Commonly known as: NORCO/VICODIN Take 2 tablets by mouth every 4 (four) hours as needed for severe pain ((score 7 to 10)).   metFORMIN 500 MG 24 hr tablet Commonly known as: GLUCOPHAGE-XR TAKE 1 TABLET BY MOUTH TWO  TIMES DAILY What changed: when to take this   methocarbamol 500 MG tablet Commonly known as: ROBAXIN Take 1 tablet (500 mg total) by mouth every 6 (six) hours as needed for muscle spasms.   pravastatin 40 MG tablet Commonly known  as: PRAVACHOL Take 1 tablet (40 mg total) by mouth daily. What changed: when to take this   repaglinide 1 MG tablet Commonly known as: Prandin Take 1 tablet (1 mg total) by mouth 3 (three) times daily before meals.   telmisartan-hydrochlorothiazide 80-25 MG tablet Commonly known as: MICARDIS HCT Take 1 tablet by mouth daily.        Signed: Peggyann Shoals, MD 07/04/2019, 8:18 AM    Patient is  intolerant of hydrocodone, so prescription was changed to oxycodone

## 2019-07-04 NOTE — Evaluation (Signed)
Physical Therapy Evaluation Patient Details Name: Jeff Burgess MRN: [id number] DOB: [birth year] Today's Date: 07/04/2019   History of Present Illness  Pt is an 84 y/o male who presents s/p L4-S1 PLIF on 07/03/2019. PMH significant for CAD s/p CABG, CKD, DM II, HLD, HTN, OA.  Clinical Impression  Pt admitted with above diagnosis. At the time of PT eval, pt was able to demonstrate transfers and ambulation with gross supervision for safety and occasional min guard assist with RW. Pt was educated on precautions, brace application/wearing schedule, appropriate activity progression, and car transfer. Pt currently with functional limitations due to the deficits listed below (see PT Problem List). Pt will benefit from skilled PT to increase their independence and safety with mobility to allow discharge to the venue listed below.      Follow Up Recommendations No PT follow up;Supervision for mobility/OOB    Equipment Recommendations  None recommended by PT    Recommendations for Other Services       Precautions / Restrictions Precautions Precautions: Fall;Back Precaution Booklet Issued: Yes (comment) Precaution Comments: Reviewed precautions during functional mobility.  Required Braces or Orthoses: Spinal Brace Spinal Brace: Lumbar corset;Applied in sitting position Restrictions Weight Bearing Restrictions: No      Mobility  Bed Mobility               General bed mobility comments: Pt was received sitting up in the recliner  Transfers Overall transfer level: Needs assistance Equipment used: Rolling walker (2 wheeled) Transfers: Sit to/from Stand Sit to Stand: Supervision         General transfer comment: No assist required. Pt was cued for hand placement on seated surface for safety.   Ambulation/Gait Ambulation/Gait assistance: Supervision Gait Distance (Feet): 300 Feet Assistive device: Rolling walker (2 wheeled) Gait Pattern/deviations: Step-through  pattern;Decreased dorsiflexion - left Gait velocity: Decreased Gait velocity interpretation: <1.8 ft/sec, indicate of risk for recurrent falls General Gait Details: VC's for improved posture, closer walker proximity, and forward gaze. Noted foot slap on the L and mildly uncoordinated steps bilaterally, however pt reports he is improved since PTA.   Stairs Stairs: Yes Stairs assistance: Min guard Stair Management: One rail Right;Step to pattern;Forwards Number of Stairs: 10 General stair comments: Pt was instructed in safe stair negotiation facing forward as well as sideways. Pt preferred forward facing leading with L despite this appearing to be the weaker leg. No overt LOB noted throughout stair training.   Wheelchair Mobility    Modified Rankin (Stroke Patients Only)       Balance Overall balance assessment: No apparent balance deficits (not formally assessed)                                           Pertinent Vitals/Pain Pain Assessment: Faces Faces Pain Scale: Hurts a little bit Pain Location: Incision site Pain Descriptors / Indicators: Operative site guarding Pain Intervention(s): Limited activity within patient's tolerance;Monitored during session;Repositioned    Home Living Family/patient expects to be discharged to:: Private residence Living Arrangements: Spouse/significant other Available Help at Discharge: Family;Available 24 hours/day Type of Home: House Home Access: Stairs to enter Entrance Stairs-Rails: Psychiatric nurse of Steps: 5 Home Layout: One level Home Equipment: Walker - 4 wheels;Cane - quad;Shower seat      Prior Function Level of Independence: Independent with assistive device(s)         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 Pt reports it is improved since prior to surgery.    Cervical / Trunk Assessment Cervical / Trunk Assessment: Other exceptions Cervical / Trunk Exceptions: s/p surgery  Communication   Communication: No difficulties  Cognition Arousal/Alertness: Awake/alert Behavior During Therapy: WFL for tasks assessed/performed Overall Cognitive Status: Within Functional Limits for tasks assessed                                        General Comments      Exercises     Assessment/Plan    PT Assessment Patient needs continued PT services  PT Problem List Decreased strength;Decreased activity tolerance;Decreased balance;Decreased mobility;Decreased knowledge of use of DME;Decreased safety awareness;Decreased knowledge of precautions;Pain       PT Treatment Interventions DME instruction;Gait training;Stair training;Functional mobility training;Therapeutic activities;Therapeutic exercise;Neuromuscular re-education;Patient/family education    PT Goals (Current goals can be found in the Care Plan section)  Acute Rehab PT Goals Patient Stated Goal: home today, back to PLOF PT Goal Formulation: With patient Time For Goal Achievement: 07/11/19 Potential to Achieve Goals: Good    Frequency Min 5X/week   Barriers to discharge        Co-evaluation               AM-PAC PT "6 Clicks" Mobility  Outcome Measure Help needed turning from your back to your side while in a flat bed without using bedrails?: None Help needed moving from lying on your back to sitting on the side of a flat bed without using bedrails?: None Help needed moving to and from a bed to a chair (including a wheelchair)?: A Little Help needed standing up from a chair using your arms (e.g., wheelchair or bedside chair)?: A Little Help needed to walk in hospital room?: A Little Help needed climbing 3-5 steps  with a railing? : A Little 6 Click Score: 20    End of Session Equipment Utilized During Treatment: Gait belt;Back brace Activity Tolerance: Patient tolerated treatment well Patient left: in chair;with call bell/phone within reach Nurse Communication: Mobility status PT Visit Diagnosis: Unsteadiness on feet (R26.81);Pain Pain - part of body:  (back)    Time: 6578-4696 PT Time Calculation (min) (ACUTE ONLY): 25 min   Charges:   PT Evaluation $PT Eval Low Complexity: 1 Low PT Treatments $Gait Training: 8-22 mins        Jeff Burgess, PT, DPT Acute Rehabilitation Services Pager: [phone] Office: [phone]   Jeff Burgess 07/04/2019, 9:56 AM

## 2019-07-04 NOTE — Progress Notes (Addendum)
Subjective: Patient reports "I feel fine. I've already walked down the hall"  Objective: Vital signs in last 24 hours: Temp:  [97.5 F (36.4 C)-98.3 F (36.8 C)] 97.7 F (36.5 C) (06/23 0725) Pulse Rate:  [62-93] 79 (06/23 0725) Resp:  [16-20] 16 (06/23 0725) BP: (113-158)/(58-82) 120/65 (06/23 0725) SpO2:  [93 %-100 %] 94 % (06/23 0725) Weight:  [83.9 kg] 83.9 kg (06/22 1007)  Intake/Output from previous day: 06/22 0701 - 06/23 0700 In: 1500 [I.V.:1150; IV Piggyback:350] Out: 1460 [Urine:1310; Blood:150] Intake/Output this shift: No intake/output data recorded.  Alert, conversant, reporting only lumbar soreness. Denies leg pain, numbness, tingling. Good strength BLE (pre-op LLE weakness much improved). Incision without erythema, swelling or drainage beneath honeycomb and Dermabond.  Lab Results: No results for input(s): WBC, HGB, HCT, PLT in the last 72 hours. BMET No results for input(s): NA, K, CL, CO2, GLUCOSE, BUN, CREATININE, CALCIUM in the last 72 hours.  Studies/Results: DG Lumbar Spine 2-3 Views  Result Date: 07/03/2019 CLINICAL DATA:  L4 through S1 decompression EXAM: LUMBAR SPINE - 2-3 VIEW; DG C-ARM 1-60 MIN COMPARISON:  MRI 05/29/2019 FINDINGS: Three low resolution intraoperative spot views of the lumbar spine. Total fluoroscopy time was 26 seconds. The images demonstrate surgical devices over the lumbosacral spine. Transpedicular screws at L4, L5, and S1. IMPRESSION: Intraoperative fluoroscopic assistance provided during lumbar spine surgery. Electronically Signed   By: Donavan Foil M.D.   On: 07/03/2019 16:59   DG C-Arm 1-60 Min  Result Date: 07/03/2019 CLINICAL DATA:  L4 through S1 decompression EXAM: LUMBAR SPINE - 2-3 VIEW; DG C-ARM 1-60 MIN COMPARISON:  MRI 05/29/2019 FINDINGS: Three low resolution intraoperative spot views of the lumbar spine. Total fluoroscopy time was 26 seconds. The images demonstrate surgical devices over the lumbosacral spine.  Transpedicular screws at L4, L5, and S1. IMPRESSION: Intraoperative fluoroscopic assistance provided during lumbar spine surgery. Electronically Signed   By: Donavan Foil M.D.   On: 07/03/2019 16:59    Assessment/Plan: improved  LOS: 1 day  Ok to d/c to home per DrStern. Pt verbalizes understanding of d/c instructions and agrees to call office to schedule 3 week post-op appt. Oxycodone 5mg  and Robaxin 500mg  will be eRx'ed for prn home use.   Verdis Prime 07/04/2019, 7:38 AM   Patient is doing well.  Discharge home.

## 2019-07-11 MED FILL — Sodium Chloride IV Soln 0.9%: INTRAVENOUS | Qty: 1000 | Status: AC

## 2019-07-11 MED FILL — Heparin Sodium (Porcine) Inj 1000 Unit/ML: INTRAMUSCULAR | Qty: 30 | Status: AC

## 2019-07-23 DIAGNOSIS — M5416 Radiculopathy, lumbar region: Secondary | ICD-10-CM | POA: Diagnosis not present

## 2019-07-31 ENCOUNTER — Other Ambulatory Visit (HOSPITAL_COMMUNITY): Payer: Self-pay | Admitting: Internal Medicine

## 2019-07-31 DIAGNOSIS — R600 Localized edema: Secondary | ICD-10-CM

## 2019-08-01 ENCOUNTER — Ambulatory Visit (HOSPITAL_COMMUNITY)
Admission: RE | Admit: 2019-08-01 | Discharge: 2019-08-01 | Disposition: A | Discharge status: Home / Self Care | Payer: Medicare Other | Source: Ambulatory Visit | Attending: Vascular Surgery | Admitting: Vascular Surgery

## 2019-08-01 ENCOUNTER — Other Ambulatory Visit: Payer: Self-pay

## 2019-08-01 DIAGNOSIS — R600 Localized edema: Secondary | ICD-10-CM | POA: Diagnosis not present

## 2019-08-30 DIAGNOSIS — M21372 Foot drop, left foot: Secondary | ICD-10-CM | POA: Diagnosis not present

## 2019-08-30 DIAGNOSIS — M5416 Radiculopathy, lumbar region: Secondary | ICD-10-CM | POA: Diagnosis not present

## 2019-09-25 ENCOUNTER — Other Ambulatory Visit: Payer: Self-pay

## 2019-09-25 ENCOUNTER — Ambulatory Visit (INDEPENDENT_AMBULATORY_CARE_PROVIDER_SITE_OTHER): Payer: Medicare Other | Admitting: Internal Medicine

## 2019-09-25 ENCOUNTER — Encounter: Payer: Self-pay | Admitting: Internal Medicine

## 2019-09-25 VITALS — BP 138/70 | HR 76 | Temp 98.3°F | Ht 69.0 in | Wt 193.0 lb

## 2019-09-25 DIAGNOSIS — G8929 Other chronic pain: Secondary | ICD-10-CM

## 2019-09-25 DIAGNOSIS — M5440 Lumbago with sciatica, unspecified side: Secondary | ICD-10-CM

## 2019-09-25 DIAGNOSIS — I2510 Atherosclerotic heart disease of native coronary artery without angina pectoris: Secondary | ICD-10-CM | POA: Diagnosis not present

## 2019-09-25 DIAGNOSIS — R269 Unspecified abnormalities of gait and mobility: Secondary | ICD-10-CM | POA: Diagnosis not present

## 2019-09-25 DIAGNOSIS — R270 Ataxia, unspecified: Secondary | ICD-10-CM

## 2019-09-25 DIAGNOSIS — E1121 Type 2 diabetes mellitus with diabetic nephropathy: Secondary | ICD-10-CM

## 2019-09-25 DIAGNOSIS — Z23 Encounter for immunization: Secondary | ICD-10-CM | POA: Diagnosis not present

## 2019-09-25 DIAGNOSIS — N1831 Chronic kidney disease, stage 3a: Secondary | ICD-10-CM | POA: Diagnosis not present

## 2019-09-25 DIAGNOSIS — E1122 Type 2 diabetes mellitus with diabetic chronic kidney disease: Secondary | ICD-10-CM

## 2019-09-25 DIAGNOSIS — M48061 Spinal stenosis, lumbar region without neurogenic claudication: Secondary | ICD-10-CM

## 2019-09-25 NOTE — Addendum Note (Signed)
Addended by: Hazle Quant on: 09/25/2019 11:03 AM   Modules accepted: Orders

## 2019-09-25 NOTE — Addendum Note (Signed)
Addended by: Lauralee Evener C on: 09/25/2019 02:12 PM   Modules accepted: Orders

## 2019-09-25 NOTE — Addendum Note (Signed)
Addended by: Elza Rafter D on: 09/25/2019 10:54 AM   Modules accepted: Orders

## 2019-09-25 NOTE — Assessment & Plan Note (Signed)
No CP 

## 2019-09-25 NOTE — Assessment & Plan Note (Signed)
  Unsteady on feet when standing up, poor balance - - "shaky legs" w/first 5-10 steps - discussed exercises Declined PT F/u w/Dr Vertell Limber

## 2019-09-25 NOTE — Assessment & Plan Note (Signed)
Doing well post-op. Unsteady on feet when standing up, poor balance - - "shaky legs" w/first 5-10 steps - discussed exercises Declined PT F/u w/Dr Vertell Limber

## 2019-09-25 NOTE — Assessment & Plan Note (Signed)
Resolved post-op

## 2019-09-25 NOTE — Assessment & Plan Note (Signed)
Labs

## 2019-09-25 NOTE — Progress Notes (Signed)
Subjective:  Patient ID: Jeff Burgess, male    DOB: 07-24-1935  Age: 84 y.o. MRN: [id number]  CC: No chief complaint on file.   HPI Jeff Burgess presents for LBP, surgery helped to improve strength, LE pain C/o unsteady on feet when standing up, poor balance - - "shaky legs" w/first 10 steps  Outpatient Medications Prior to Visit  Medication Sig Dispense Refill  . amLODipine (NORVASC) 5 MG tablet Take 1 tablet (5 mg total) by mouth daily. 90 tablet 3  . aspirin 81 MG tablet Take 2 tablets (162 mg total) by mouth daily. (Patient taking differently: Take 81 mg by mouth in the morning and at bedtime. ) 100 tablet 3  . cholecalciferol (VITAMIN D) 25 MCG (1000 UT) tablet Take 1 tablet (1,000 Units total) by mouth every morning. Follow-up due in April must see provider for refills (Patient taking differently: Take 1,000 Units by mouth daily. Follow-up due in April must see provider for refills) 90 tablet 0  . metFORMIN (GLUCOPHAGE-XR) 500 MG 24 hr tablet TAKE 1 TABLET BY MOUTH TWO  TIMES DAILY (Patient taking differently: Take 500 mg by mouth in the morning and at bedtime. ) 180 tablet 3  . methocarbamol (ROBAXIN) 500 MG tablet Take 1 tablet (500 mg total) by mouth every 6 (six) hours as needed for muscle spasms. 60 tablet 1  . oxyCODONE (OXY IR/ROXICODONE) 5 MG immediate release tablet Take 1 tablet (5 mg total) by mouth every 3 (three) hours as needed for moderate pain ((score 4 to 6)). 30 tablet 0  . pravastatin (PRAVACHOL) 40 MG tablet Take 1 tablet (40 mg total) by mouth daily. (Patient taking differently: Take 40 mg by mouth every evening. ) 90 tablet 3  . repaglinide (PRANDIN) 1 MG tablet Take 1 tablet (1 mg total) by mouth 3 (three) times daily before meals. 90 tablet 11  . telmisartan-hydrochlorothiazide (MICARDIS HCT) 80-25 MG tablet Take 1 tablet by mouth daily. 90 tablet 3   No facility-administered medications prior to visit.    ROS: Review of Systems  Constitutional:  Negative for appetite change, fatigue and unexpected weight change.  HENT: Negative for congestion, nosebleeds, sneezing, sore throat and trouble swallowing.   Eyes: Negative for itching and visual disturbance.  Respiratory: Negative for cough.   Cardiovascular: Negative for chest pain, palpitations and leg swelling.  Gastrointestinal: Negative for abdominal distention, blood in stool, diarrhea and nausea.  Genitourinary: Negative for frequency and hematuria.  Musculoskeletal: Positive for back pain and gait problem. Negative for joint swelling and neck pain.  Skin: Negative for rash.  Neurological: Negative for dizziness, tremors, speech difficulty and weakness.  Psychiatric/Behavioral: Negative for agitation, dysphoric mood and sleep disturbance. The patient is not nervous/anxious.     Objective:  BP 138/70 (BP Location: Left Arm, Patient Position: Sitting, Cuff Size: Large)   Pulse 76   Temp 98.3 F (36.8 C) (Oral)   Ht 5\' 9"  (1.753 m)   Wt 193 lb (87.5 kg)   SpO2 99%   BMI 28.50 kg/m   BP Readings from Last 3 Encounters:  09/25/19 138/70  07/04/19 120/65  06/27/19 (!) 160/87    Wt Readings from Last 3 Encounters:  09/25/19 193 lb (87.5 kg)  07/03/19 185 lb (83.9 kg)  06/27/19 186 lb 3.8 oz (84.5 kg)    Physical Exam Constitutional:      General: He is not in acute distress.    Appearance: He is well-developed.     Comments: NAD  Eyes:     Conjunctiva/sclera: Conjunctivae normal.     Pupils: Pupils are equal, round, and reactive to light.  Neck:     Thyroid: No thyromegaly.     Vascular: No JVD.  Cardiovascular:     Rate and Rhythm: Normal rate and regular rhythm.     Heart sounds: Normal heart sounds. No murmur heard.  No friction rub. No gallop.   Pulmonary:     Effort: Pulmonary effort is normal. No respiratory distress.     Breath sounds: Normal breath sounds. No wheezing or rales.  Chest:     Chest wall: No tenderness.  Abdominal:     General: Bowel  sounds are normal. There is no distension.     Palpations: Abdomen is soft. There is no mass.     Tenderness: There is no abdominal tenderness. There is no guarding or rebound.  Musculoskeletal:        General: No tenderness. Normal range of motion.     Cervical back: Normal range of motion.  Lymphadenopathy:     Cervical: No cervical adenopathy.  Skin:    General: Skin is warm and dry.     Findings: No rash.  Neurological:     Mental Status: He is alert and oriented to person, place, and time.     Cranial Nerves: No cranial nerve deficit.     Motor: No abnormal muscle tone.     Coordination: Coordination abnormal.     Gait: Gait normal.     Deep Tendon Reflexes: Reflexes are normal and symmetric.  Psychiatric:        Behavior: Behavior normal.        Thought Content: Thought content normal.        Judgment: Judgment normal.     unsteady on feet when standing up, poor balance - - "shaky legs" w/first 5-10 steps  Stiff LS   Lab Results  Component Value Date   WBC 11.2 (H) 06/27/2019   HGB 14.6 06/27/2019   HCT 43.7 06/27/2019   PLT 229 06/27/2019   GLUCOSE 135 (H) 06/25/2019   CHOL 122 10/03/2012   TRIG 166.0 (H) 10/03/2012   HDL 29.20 (L) 10/03/2012   LDLDIRECT 78.4 11/02/2011   LDLCALC 60 10/03/2012   ALT 16 08/11/2015   AST 14 08/11/2015   NA 137 06/25/2019   K 3.6 06/25/2019   CL 103 06/25/2019   CREATININE 1.16 06/25/2019   BUN 20 06/25/2019   CO2 27 06/25/2019   TSH 1.53 10/03/2012   PSA 1.48 10/03/2012   INR 1.02 10/08/2015   HGBA1C 7.5 (H) 06/25/2019    VAS [place] LOWER EXTREMITY VENOUS (DVT)  Result Date: 08/01/2019  Lower Venous DVT Study Indications: Edema, Pain, and Swelling. Other Indications: Patient reports that lower extremity edema has resolved. Risk Factors: Surgery Recent back surgery. Performing Technologist: Delorise Shiner RVT  Examination Guidelines: A complete evaluation includes B-mode imaging, spectral Doppler, color Doppler, and power  Doppler as needed of all accessible portions of each vessel. Bilateral testing is considered an integral part of a complete examination. Limited examinations for reoccurring indications may be performed as noted. The reflux portion of the exam is performed with the patient in reverse Trendelenburg.  +---------+---------------+---------+-----------+----------+--------------+ RIGHT    CompressibilityPhasicitySpontaneityPropertiesThrombus Aging +---------+---------------+---------+-----------+----------+--------------+ CFV      Full           Yes      Yes                                 +---------+---------------+---------+-----------+----------+--------------+  SFJ      Full           Yes      Yes                                 +---------+---------------+---------+-----------+----------+--------------+ FV Prox  Full           Yes      Yes                                 +---------+---------------+---------+-----------+----------+--------------+ FV Mid   Full           Yes      Yes                                 +---------+---------------+---------+-----------+----------+--------------+ FV DistalFull           Yes      Yes                                 +---------+---------------+---------+-----------+----------+--------------+ PFV      Full                    Yes                                 +---------+---------------+---------+-----------+----------+--------------+ POP      Full           Yes      Yes                                 +---------+---------------+---------+-----------+----------+--------------+ PTV      Full                                                        +---------+---------------+---------+-----------+----------+--------------+ PERO     Full                                                        +---------+---------------+---------+-----------+----------+--------------+ GSV      Full                    Yes                                  +---------+---------------+---------+-----------+----------+--------------+ SSV      Full                                                        +---------+---------------+---------+-----------+----------+--------------+   +---------+---------------+---------+-----------+----------+--------------+ LEFT     CompressibilityPhasicitySpontaneityPropertiesThrombus Aging +---------+---------------+---------+-----------+----------+--------------+ CFV  Full           Yes      Yes                                 +---------+---------------+---------+-----------+----------+--------------+ SFJ      Full           Yes      Yes                                 +---------+---------------+---------+-----------+----------+--------------+ FV Prox  Full           Yes      Yes                                 +---------+---------------+---------+-----------+----------+--------------+ FV Mid   Full           Yes      Yes                                 +---------+---------------+---------+-----------+----------+--------------+ FV DistalFull           Yes      Yes                                 +---------+---------------+---------+-----------+----------+--------------+ PFV      Full                    Yes                                 +---------+---------------+---------+-----------+----------+--------------+ POP      Full           Yes      Yes                                 +---------+---------------+---------+-----------+----------+--------------+ PTV      Full                    Yes                                 +---------+---------------+---------+-----------+----------+--------------+ PERO     Full                    Yes                                 +---------+---------------+---------+-----------+----------+--------------+ GSV      Full                    Yes                                  +---------+---------------+---------+-----------+----------+--------------+ SSV      Full                                                        +---------+---------------+---------+-----------+----------+--------------+  Summary: BILATERAL: - No evidence of deep vein thrombosis seen in the lower extremities, bilaterally. - No evidence of superficial venous thrombosis in the lower extremities, bilaterally. -No evidence of popliteal cyst, bilaterally.   *See table(s) above for measurements and observations. Electronically signed by Deitra Mayo MD on 08/01/2019 at 12:45:24 PM.    Final     Assessment & Plan:   There are no diagnoses linked to this encounter.   No orders of the defined types were placed in this encounter.    Follow-up: No follow-ups on file.  Walker Kehr, MD

## 2019-09-26 LAB — HEMOGLOBIN A1C
Hgb A1c MFr Bld: 6.7 % of total Hgb — ABNORMAL HIGH (ref <5.7)
Mean Plasma Glucose: 146 (calc)
eAG (mmol/L): 8.1 (calc)

## 2019-09-26 LAB — BASIC METABOLIC PANEL
BUN/Creatinine Ratio: 19 (calc) (ref 6–22)
BUN: 22 mg/dL (ref 7–25)
CO2: 28 mmol/L (ref 20–32)
Calcium: 9.5 mg/dL (ref 8.6–10.3)
Chloride: 104 mmol/L (ref 98–110)
Creat: 1.16 mg/dL — ABNORMAL HIGH (ref 0.70–1.11)
Glucose, Bld: 146 mg/dL — ABNORMAL HIGH (ref 65–99)
Potassium: 3.9 mmol/L (ref 3.5–5.3)
Sodium: 140 mmol/L (ref 135–146)

## 2019-10-24 DIAGNOSIS — M48061 Spinal stenosis, lumbar region without neurogenic claudication: Secondary | ICD-10-CM | POA: Diagnosis not present

## 2019-10-24 DIAGNOSIS — M21372 Foot drop, left foot: Secondary | ICD-10-CM | POA: Diagnosis not present

## 2019-10-24 DIAGNOSIS — M5416 Radiculopathy, lumbar region: Secondary | ICD-10-CM | POA: Diagnosis not present

## 2019-10-24 DIAGNOSIS — M5126 Other intervertebral disc displacement, lumbar region: Secondary | ICD-10-CM | POA: Diagnosis not present

## 2019-10-31 ENCOUNTER — Telehealth: Payer: Self-pay | Admitting: Internal Medicine

## 2019-10-31 NOTE — Telephone Encounter (Signed)
Patient is wondering if he is up to date on his shots and if not what shots he needs to receive.  Patients # [phone]

## 2019-10-31 NOTE — Telephone Encounter (Signed)
Notified pt per chart only immunization is needed is the Zostavax for shingles. Pt is going to check w/insurance to see if he has an deductible then call back to schedule.Marland KitchenJohny Burgess

## 2019-11-05 ENCOUNTER — Other Ambulatory Visit: Payer: Self-pay | Admitting: Internal Medicine

## 2019-12-12 ENCOUNTER — Telehealth: Payer: Self-pay | Admitting: Internal Medicine

## 2019-12-12 NOTE — Telephone Encounter (Signed)
LVM for pt to rtn my call to [phone] to schedule AWV with [facility]. Please schedule this appt if pt calls the office.   Thanks, [employer]

## 2019-12-25 ENCOUNTER — Other Ambulatory Visit: Payer: Self-pay

## 2019-12-26 ENCOUNTER — Encounter: Payer: Self-pay | Admitting: Internal Medicine

## 2019-12-26 ENCOUNTER — Ambulatory Visit (INDEPENDENT_AMBULATORY_CARE_PROVIDER_SITE_OTHER): Payer: Medicare Other | Admitting: Internal Medicine

## 2019-12-26 VITALS — BP 152/82 | HR 84 | Temp 98.1°F | Wt 194.8 lb

## 2019-12-26 DIAGNOSIS — M48061 Spinal stenosis, lumbar region without neurogenic claudication: Secondary | ICD-10-CM | POA: Diagnosis not present

## 2019-12-26 DIAGNOSIS — N1831 Chronic kidney disease, stage 3a: Secondary | ICD-10-CM | POA: Diagnosis not present

## 2019-12-26 DIAGNOSIS — E1122 Type 2 diabetes mellitus with diabetic chronic kidney disease: Secondary | ICD-10-CM | POA: Diagnosis not present

## 2019-12-26 DIAGNOSIS — E785 Hyperlipidemia, unspecified: Secondary | ICD-10-CM

## 2019-12-26 DIAGNOSIS — I10 Essential (primary) hypertension: Secondary | ICD-10-CM

## 2019-12-26 DIAGNOSIS — N320 Bladder-neck obstruction: Secondary | ICD-10-CM | POA: Diagnosis not present

## 2019-12-26 DIAGNOSIS — G8929 Other chronic pain: Secondary | ICD-10-CM

## 2019-12-26 DIAGNOSIS — M5440 Lumbago with sciatica, unspecified side: Secondary | ICD-10-CM

## 2019-12-26 DIAGNOSIS — L570 Actinic keratosis: Secondary | ICD-10-CM

## 2019-12-26 LAB — CBC WITH DIFFERENTIAL/PLATELET
Basophils Absolute: 0.1 10*3/uL (ref 0.0–0.1)
Basophils Relative: 0.9 % (ref 0.0–3.0)
Eosinophils Absolute: 0.2 10*3/uL (ref 0.0–0.7)
Eosinophils Relative: 1.6 % (ref 0.0–5.0)
HCT: 39.9 % (ref 39.0–52.0)
Hemoglobin: 13.6 g/dL (ref 13.0–17.0)
Lymphocytes Relative: 17.5 % (ref 12.0–46.0)
Lymphs Abs: 1.8 10*3/uL (ref 0.7–4.0)
MCHC: 34.2 g/dL (ref 30.0–36.0)
MCV: 87.7 fl (ref 78.0–100.0)
Monocytes Absolute: 0.9 10*3/uL (ref 0.1–1.0)
Monocytes Relative: 8.9 % (ref 3.0–12.0)
Neutro Abs: 7.3 10*3/uL (ref 1.4–7.7)
Neutrophils Relative %: 71.1 % (ref 43.0–77.0)
Platelets: 229 10*3/uL (ref 150.0–400.0)
RBC: 4.55 Mil/uL (ref 4.22–5.81)
RDW: 13 % (ref 11.5–15.5)
WBC: 10.2 10*3/uL (ref 4.0–10.5)

## 2019-12-26 LAB — URINALYSIS
Bilirubin Urine: NEGATIVE
Hgb urine dipstick: NEGATIVE
Leukocytes,Ua: NEGATIVE
Nitrite: NEGATIVE
Specific Gravity, Urine: 1.025 (ref 1.000–1.030)
Total Protein, Urine: NEGATIVE
Urine Glucose: NEGATIVE
Urobilinogen, UA: 0.2 (ref 0.0–1.0)
pH: 5.5 (ref 5.0–8.0)

## 2019-12-26 LAB — LIPID PANEL
Cholesterol: 123 mg/dL (ref 0–200)
HDL: 33.8 mg/dL — ABNORMAL LOW (ref >39.00)
LDL Cholesterol: 55 mg/dL (ref 0–99)
NonHDL: 89.35
Total CHOL/HDL Ratio: 4
Triglycerides: 170 mg/dL — ABNORMAL HIGH (ref 0.0–149.0)
VLDL: 34 mg/dL (ref 0.0–40.0)

## 2019-12-26 LAB — COMPREHENSIVE METABOLIC PANEL
ALT: 21 U/L (ref 0–53)
AST: 18 U/L (ref 0–37)
Albumin: 4.3 g/dL (ref 3.5–5.2)
Alkaline Phosphatase: 87 U/L (ref 39–117)
BUN: 22 mg/dL (ref 6–23)
CO2: 28 mEq/L (ref 19–32)
Calcium: 9.7 mg/dL (ref 8.4–10.5)
Chloride: 102 mEq/L (ref 96–112)
Creatinine, Ser: 1.26 mg/dL (ref 0.40–1.50)
GFR: 53.02 mL/min — ABNORMAL LOW (ref >60.00)
Glucose, Bld: 151 mg/dL — ABNORMAL HIGH (ref 70–99)
Potassium: 4.2 mEq/L (ref 3.5–5.1)
Sodium: 139 mEq/L (ref 135–145)
Total Bilirubin: 0.7 mg/dL (ref 0.2–1.2)
Total Protein: 6.7 g/dL (ref 6.0–8.3)

## 2019-12-26 LAB — PSA: PSA: 2.34 ng/mL (ref 0.10–4.00)

## 2019-12-26 LAB — TSH: TSH: 2.14 u[IU]/mL (ref 0.35–4.50)

## 2019-12-26 LAB — HEMOGLOBIN A1C: Hgb A1c MFr Bld: 7.1 % — ABNORMAL HIGH (ref 4.6–6.5)

## 2019-12-26 NOTE — Assessment & Plan Note (Signed)
He will see his Dermatologist

## 2019-12-26 NOTE — Assessment & Plan Note (Signed)
Labs Metformin 

## 2019-12-26 NOTE — Patient Instructions (Signed)
   B-complex with Niacin 100 mg    Lion's mane  

## 2019-12-26 NOTE — Progress Notes (Signed)
Subjective:  Patient ID: Jeff Burgess, male    DOB: 1935-12-11  Age: 84 y.o. MRN: [id number]  CC: Follow-up (3 month f/u)   HPI Jeff Burgess presents for LBP, gait issues, weak legs - not better F/u DM, HTN   Outpatient Medications Prior to Visit  Medication Sig Dispense Refill  . amLODipine (NORVASC) 5 MG tablet Take 1 tablet (5 mg total) by mouth daily. 90 tablet 3  . aspirin 81 MG tablet Take 2 tablets (162 mg total) by mouth daily. (Patient taking differently: Take 81 mg by mouth in the morning and at bedtime.) 100 tablet 3  . cholecalciferol (VITAMIN D) 25 MCG (1000 UT) tablet Take 1 tablet (1,000 Units total) by mouth every morning. Follow-up due in April must see provider for refills (Patient taking differently: Take 1,000 Units by mouth daily. Follow-up due in April must see provider for refills) 90 tablet 0  . metFORMIN (GLUCOPHAGE-XR) 500 MG 24 hr tablet TAKE 1 TABLET BY MOUTH TWO  TIMES DAILY (Patient taking differently: Take 500 mg by mouth in the morning and at bedtime.) 180 tablet 3  . methocarbamol (ROBAXIN) 500 MG tablet Take 1 tablet (500 mg total) by mouth every 6 (six) hours as needed for muscle spasms. 60 tablet 1  . oxyCODONE (OXY IR/ROXICODONE) 5 MG immediate release tablet Take 1 tablet (5 mg total) by mouth every 3 (three) hours as needed for moderate pain ((score 4 to 6)). 30 tablet 0  . pravastatin (PRAVACHOL) 40 MG tablet Take 1 tablet (40 mg total) by mouth daily. Annual appt w/labs due in Dec must see provider for future refills 90 tablet 0  . repaglinide (PRANDIN) 1 MG tablet Take 1 tablet (1 mg total) by mouth 3 (three) times daily before meals. 90 tablet 11  . telmisartan-hydrochlorothiazide (MICARDIS HCT) 80-25 MG tablet Take 1 tablet by mouth daily. 90 tablet 3   No facility-administered medications prior to visit.    ROS: Review of Systems  Constitutional: Negative for appetite change, fatigue and unexpected weight change.  HENT: Negative for  congestion, nosebleeds, sneezing, sore throat and trouble swallowing.   Eyes: Negative for itching and visual disturbance.  Respiratory: Negative for cough.   Cardiovascular: Negative for chest pain, palpitations and leg swelling.  Gastrointestinal: Negative for abdominal distention, blood in stool, diarrhea and nausea.  Genitourinary: Negative for frequency and hematuria.  Musculoskeletal: Positive for arthralgias, back pain and gait problem. Negative for joint swelling and neck pain.  Skin: Negative for rash.  Neurological: Positive for weakness and numbness. Negative for dizziness, tremors and speech difficulty.  Psychiatric/Behavioral: Negative for agitation, dysphoric mood, sleep disturbance and suicidal ideas. The patient is not nervous/anxious.     Objective:  BP (!) 152/82 (BP Location: Left Arm)   Pulse 84   Temp 98.1 F (36.7 C) (Oral)   Wt 194 lb 12.8 oz (88.4 kg)   SpO2 97%   BMI 28.77 kg/m   BP Readings from Last 3 Encounters:  12/26/19 (!) 152/82  09/25/19 138/70  07/04/19 120/65    Wt Readings from Last 3 Encounters:  12/26/19 194 lb 12.8 oz (88.4 kg)  09/25/19 193 lb (87.5 kg)  07/03/19 185 lb (83.9 kg)    Physical Exam Constitutional:      General: He is not in acute distress.    Appearance: He is well-developed.     Comments: NAD  HENT:     Mouth/Throat:     Mouth: Oropharynx is clear and moist.  Eyes:     Conjunctiva/sclera: Conjunctivae normal.     Pupils: Pupils are equal, round, and reactive to light.  Neck:     Thyroid: No thyromegaly.     Vascular: No JVD.  Cardiovascular:     Rate and Rhythm: Normal rate and regular rhythm.     Pulses: Intact distal pulses.     Heart sounds: Normal heart sounds. No murmur heard. No friction rub. No gallop.   Pulmonary:     Effort: Pulmonary effort is normal. No respiratory distress.     Breath sounds: Normal breath sounds. No wheezing or rales.  Chest:     Chest wall: No tenderness.  Abdominal:      General: Bowel sounds are normal. There is no distension.     Palpations: Abdomen is soft. There is no mass.     Tenderness: There is no abdominal tenderness. There is no guarding or rebound.  Musculoskeletal:        General: Tenderness present. No edema. Normal range of motion.     Cervical back: Normal range of motion.  Lymphadenopathy:     Cervical: No cervical adenopathy.  Skin:    General: Skin is warm and dry.     Findings: No rash.  Neurological:     Mental Status: He is alert and oriented to person, place, and time.     Cranial Nerves: No cranial nerve deficit.     Motor: Weakness present. No abnormal muscle tone.     Coordination: He displays a negative Romberg sign. Coordination normal.     Gait: Gait abnormal.     Deep Tendon Reflexes: Reflexes are normal and symmetric.  Psychiatric:        Mood and Affect: Mood and affect normal.        Behavior: Behavior normal.        Thought Content: Thought content normal.        Judgment: Judgment normal.   LS w/pain Cane Weak LEs Moles/SKs  Lab Results  Component Value Date   WBC 11.2 (H) 06/27/2019   HGB 14.6 06/27/2019   HCT 43.7 06/27/2019   PLT 229 06/27/2019   GLUCOSE 146 (H) 09/25/2019   CHOL 122 10/03/2012   TRIG 166.0 (H) 10/03/2012   HDL 29.20 (L) 10/03/2012   LDLDIRECT 78.4 11/02/2011   LDLCALC 60 10/03/2012   ALT 16 08/11/2015   AST 14 08/11/2015   NA 140 09/25/2019   K 3.9 09/25/2019   CL 104 09/25/2019   CREATININE 1.16 (H) 09/25/2019   BUN 22 09/25/2019   CO2 28 09/25/2019   TSH 1.53 10/03/2012   PSA 1.48 10/03/2012   INR 1.02 10/08/2015   HGBA1C 6.7 (H) 09/25/2019    VAS [place] LOWER EXTREMITY VENOUS (DVT)  Result Date: 08/01/2019  Lower Venous DVT Study Indications: Edema, Pain, and Swelling. Other Indications: Patient reports that lower extremity edema has resolved. Risk Factors: Surgery Recent back surgery. Performing Technologist: Delorise Shiner RVT  Examination Guidelines: A complete  evaluation includes B-mode imaging, spectral Doppler, color Doppler, and power Doppler as needed of all accessible portions of each vessel. Bilateral testing is considered an integral part of a complete examination. Limited examinations for reoccurring indications may be performed as noted. The reflux portion of the exam is performed with the patient in reverse Trendelenburg.  +---------+---------------+---------+-----------+----------+--------------+ RIGHT    CompressibilityPhasicitySpontaneityPropertiesThrombus Aging +---------+---------------+---------+-----------+----------+--------------+ CFV      Full           Yes  Yes                                 +---------+---------------+---------+-----------+----------+--------------+ SFJ      Full           Yes      Yes                                 +---------+---------------+---------+-----------+----------+--------------+ FV Prox  Full           Yes      Yes                                 +---------+---------------+---------+-----------+----------+--------------+ FV Mid   Full           Yes      Yes                                 +---------+---------------+---------+-----------+----------+--------------+ FV DistalFull           Yes      Yes                                 +---------+---------------+---------+-----------+----------+--------------+ PFV      Full                    Yes                                 +---------+---------------+---------+-----------+----------+--------------+ POP      Full           Yes      Yes                                 +---------+---------------+---------+-----------+----------+--------------+ PTV      Full                                                        +---------+---------------+---------+-----------+----------+--------------+ PERO     Full                                                         +---------+---------------+---------+-----------+----------+--------------+ GSV      Full                    Yes                                 +---------+---------------+---------+-----------+----------+--------------+ SSV      Full                                                        +---------+---------------+---------+-----------+----------+--------------+   +---------+---------------+---------+-----------+----------+--------------+  LEFT     CompressibilityPhasicitySpontaneityPropertiesThrombus Aging +---------+---------------+---------+-----------+----------+--------------+ CFV      Full           Yes      Yes                                 +---------+---------------+---------+-----------+----------+--------------+ SFJ      Full           Yes      Yes                                 +---------+---------------+---------+-----------+----------+--------------+ FV Prox  Full           Yes      Yes                                 +---------+---------------+---------+-----------+----------+--------------+ FV Mid   Full           Yes      Yes                                 +---------+---------------+---------+-----------+----------+--------------+ FV DistalFull           Yes      Yes                                 +---------+---------------+---------+-----------+----------+--------------+ PFV      Full                    Yes                                 +---------+---------------+---------+-----------+----------+--------------+ POP      Full           Yes      Yes                                 +---------+---------------+---------+-----------+----------+--------------+ PTV      Full                    Yes                                 +---------+---------------+---------+-----------+----------+--------------+ PERO     Full                    Yes                                  +---------+---------------+---------+-----------+----------+--------------+ GSV      Full                    Yes                                 +---------+---------------+---------+-----------+----------+--------------+ SSV      Full                                                        +---------+---------------+---------+-----------+----------+--------------+  Summary: BILATERAL: - No evidence of deep vein thrombosis seen in the lower extremities, bilaterally. - No evidence of superficial venous thrombosis in the lower extremities, bilaterally. -No evidence of popliteal cyst, bilaterally.   *See table(s) above for measurements and observations. Electronically signed by Deitra Mayo MD on 08/01/2019 at 12:45:24 PM.    Final     Assessment & Plan:    Walker Kehr, MD

## 2019-12-26 NOTE — Addendum Note (Signed)
Addended by: Trenda Moots on: 17/40/9927 10:23 AM   Modules accepted: Orders

## 2019-12-26 NOTE — Assessment & Plan Note (Addendum)
Stable Tylenol prn Lions mane CBD gummies

## 2019-12-26 NOTE — Assessment & Plan Note (Signed)
Overall better Try Lions mane

## 2019-12-26 NOTE — Assessment & Plan Note (Signed)
BP Readings from Last 3 Encounters:  12/26/19 (!) 152/82  09/25/19 138/70  07/04/19 120/65

## 2020-01-06 ENCOUNTER — Other Ambulatory Visit: Payer: Self-pay | Admitting: Internal Medicine

## 2020-01-14 ENCOUNTER — Telehealth: Payer: Self-pay | Admitting: Internal Medicine

## 2020-01-14 NOTE — Telephone Encounter (Signed)
LVM for pt to rtn my call to schedule AWV with [facility]. Please schedule this appt if pt call the office.  °

## 2020-02-06 ENCOUNTER — Other Ambulatory Visit: Payer: Self-pay | Admitting: Internal Medicine

## 2020-02-08 ENCOUNTER — Other Ambulatory Visit: Payer: Self-pay | Admitting: Internal Medicine

## 2020-02-19 DIAGNOSIS — D485 Neoplasm of uncertain behavior of skin: Secondary | ICD-10-CM | POA: Diagnosis not present

## 2020-02-19 DIAGNOSIS — L570 Actinic keratosis: Secondary | ICD-10-CM | POA: Diagnosis not present

## 2020-02-19 DIAGNOSIS — D229 Melanocytic nevi, unspecified: Secondary | ICD-10-CM | POA: Diagnosis not present

## 2020-02-19 DIAGNOSIS — L820 Inflamed seborrheic keratosis: Secondary | ICD-10-CM | POA: Diagnosis not present

## 2020-02-19 DIAGNOSIS — L821 Other seborrheic keratosis: Secondary | ICD-10-CM | POA: Diagnosis not present

## 2020-02-19 DIAGNOSIS — L814 Other melanin hyperpigmentation: Secondary | ICD-10-CM | POA: Diagnosis not present

## 2020-02-19 DIAGNOSIS — L819 Disorder of pigmentation, unspecified: Secondary | ICD-10-CM | POA: Diagnosis not present

## 2020-04-01 ENCOUNTER — Other Ambulatory Visit: Payer: Self-pay

## 2020-04-01 ENCOUNTER — Encounter: Payer: Self-pay | Admitting: Internal Medicine

## 2020-04-01 ENCOUNTER — Ambulatory Visit (INDEPENDENT_AMBULATORY_CARE_PROVIDER_SITE_OTHER): Payer: Medicare Other | Admitting: Internal Medicine

## 2020-04-01 VITALS — BP 148/72 | HR 84 | Temp 97.7°F | Ht 69.0 in | Wt 193.4 lb

## 2020-04-01 DIAGNOSIS — G8929 Other chronic pain: Secondary | ICD-10-CM | POA: Diagnosis not present

## 2020-04-01 DIAGNOSIS — R269 Unspecified abnormalities of gait and mobility: Secondary | ICD-10-CM

## 2020-04-01 DIAGNOSIS — N1831 Chronic kidney disease, stage 3a: Secondary | ICD-10-CM

## 2020-04-01 DIAGNOSIS — E785 Hyperlipidemia, unspecified: Secondary | ICD-10-CM

## 2020-04-01 DIAGNOSIS — E1122 Type 2 diabetes mellitus with diabetic chronic kidney disease: Secondary | ICD-10-CM | POA: Diagnosis not present

## 2020-04-01 DIAGNOSIS — M5440 Lumbago with sciatica, unspecified side: Secondary | ICD-10-CM

## 2020-04-01 LAB — BASIC METABOLIC PANEL
BUN: 27 mg/dL — ABNORMAL HIGH (ref 6–23)
CO2: 28 mEq/L (ref 19–32)
Calcium: 9.8 mg/dL (ref 8.4–10.5)
Chloride: 101 mEq/L (ref 96–112)
Creatinine, Ser: 1.21 mg/dL (ref 0.40–1.50)
GFR: 55.55 mL/min — ABNORMAL LOW (ref >60.00)
Glucose, Bld: 160 mg/dL — ABNORMAL HIGH (ref 70–99)
Potassium: 4.1 mEq/L (ref 3.5–5.1)
Sodium: 137 mEq/L (ref 135–145)

## 2020-04-01 LAB — HEMOGLOBIN A1C: Hgb A1c MFr Bld: 7.2 % — ABNORMAL HIGH (ref 4.6–6.5)

## 2020-04-01 NOTE — Assessment & Plan Note (Signed)
Lions mane mushroom

## 2020-04-01 NOTE — Progress Notes (Signed)
Subjective:  Patient ID: Jeff Burgess, male    DOB: 1935/07/30  Age: 85 y.o. MRN: [id number]  CC: No chief complaint on file.   HPI AEDEN MATRANGA presents for DM, HTN, CAD, dyslipidemia  Outpatient Medications Prior to Visit  Medication Sig Dispense Refill  . amLODipine (NORVASC) 5 MG tablet Take 1 tablet (5 mg total) by mouth daily. 90 tablet 3  . aspirin 81 MG tablet Take 2 tablets (162 mg total) by mouth daily. (Patient taking differently: Take 81 mg by mouth in the morning and at bedtime.) 100 tablet 3  . cholecalciferol (VITAMIN D) 25 MCG (1000 UT) tablet Take 1 tablet (1,000 Units total) by mouth every morning. Follow-up due in April must see provider for refills (Patient taking differently: Take 1,000 Units by mouth daily. Follow-up due in April must see provider for refills) 90 tablet 0  . metFORMIN (GLUCOPHAGE-XR) 500 MG 24 hr tablet TAKE 1 TABLET BY MOUTH  TWICE DAILY 180 tablet 2  . methocarbamol (ROBAXIN) 500 MG tablet Take 1 tablet (500 mg total) by mouth every 6 (six) hours as needed for muscle spasms. 60 tablet 1  . oxyCODONE (OXY IR/ROXICODONE) 5 MG immediate release tablet Take 1 tablet (5 mg total) by mouth every 3 (three) hours as needed for moderate pain ((score 4 to 6)). 30 tablet 0  . pravastatin (PRAVACHOL) 40 MG tablet TAKE 1 TABLET BY MOUTH  DAILY 90 tablet 3  . repaglinide (PRANDIN) 1 MG tablet Take 1 tablet (1 mg total) by mouth 3 (three) times daily before meals. 90 tablet 11  . telmisartan-hydrochlorothiazide (MICARDIS HCT) 80-25 MG tablet TAKE 1 TABLET BY MOUTH  DAILY 90 tablet 3   No facility-administered medications prior to visit.    ROS: Review of Systems  Constitutional: Negative for appetite change, fatigue and unexpected weight change.  HENT: Negative for congestion, nosebleeds, sneezing, sore throat and trouble swallowing.   Eyes: Negative for itching and visual disturbance.  Respiratory: Negative for cough.   Cardiovascular: Negative for  chest pain, palpitations and leg swelling.  Gastrointestinal: Negative for abdominal distention, blood in stool, diarrhea and nausea.  Genitourinary: Negative for frequency and hematuria.  Musculoskeletal: Positive for arthralgias, back pain and gait problem. Negative for joint swelling and neck pain.  Skin: Negative for rash.  Neurological: Negative for dizziness, tremors, speech difficulty and weakness.  Psychiatric/Behavioral: Negative for agitation, dysphoric mood and sleep disturbance. The patient is not nervous/anxious.     Objective:  BP (!) 148/72 (BP Location: Left Arm)   Pulse 84   Temp 97.7 F (36.5 C) (Oral)   Ht 5\' 9"  (1.753 m)   Wt 193 lb 6.4 oz (87.7 kg)   SpO2 99%   BMI 28.56 kg/m   BP Readings from Last 3 Encounters:  04/01/20 (!) 148/72  12/26/19 (!) 152/82  09/25/19 138/70    Wt Readings from Last 3 Encounters:  04/01/20 193 lb 6.4 oz (87.7 kg)  12/26/19 194 lb 12.8 oz (88.4 kg)  09/25/19 193 lb (87.5 kg)    Physical Exam Constitutional:      General: He is not in acute distress.    Appearance: He is well-developed. He is obese.     Comments: NAD  Eyes:     Conjunctiva/sclera: Conjunctivae normal.     Pupils: Pupils are equal, round, and reactive to light.  Neck:     Thyroid: No thyromegaly.     Vascular: No JVD.  Cardiovascular:     Rate  and Rhythm: Normal rate and regular rhythm.     Heart sounds: Normal heart sounds. No murmur heard. No friction rub. No gallop.   Pulmonary:     Effort: Pulmonary effort is normal. No respiratory distress.     Breath sounds: Normal breath sounds. No wheezing or rales.  Chest:     Chest wall: No tenderness.  Abdominal:     General: Bowel sounds are normal. There is no distension.     Palpations: Abdomen is soft. There is no mass.     Tenderness: There is no abdominal tenderness. There is no guarding or rebound.  Musculoskeletal:        General: No tenderness. Normal range of motion.     Cervical back:  Normal range of motion.  Lymphadenopathy:     Cervical: No cervical adenopathy.  Skin:    General: Skin is warm and dry.     Findings: No rash.  Neurological:     Mental Status: He is alert and oriented to person, place, and time.     Cranial Nerves: No cranial nerve deficit.     Motor: Weakness present. No abnormal muscle tone.     Coordination: Coordination abnormal.     Gait: Gait abnormal.     Deep Tendon Reflexes: Reflexes are normal and symmetric.  Psychiatric:        Behavior: Behavior normal.        Thought Content: Thought content normal.        Judgment: Judgment normal.   Thank his gait is little better.  It is still unsteady  Lab Results  Component Value Date   WBC 10.2 12/26/2019   HGB 13.6 12/26/2019   HCT 39.9 12/26/2019   PLT 229.0 12/26/2019   GLUCOSE 151 (H) 12/26/2019   CHOL 123 12/26/2019   TRIG 170.0 (H) 12/26/2019   HDL 33.80 (L) 12/26/2019   LDLDIRECT 78.4 11/02/2011   LDLCALC 55 12/26/2019   ALT 21 12/26/2019   AST 18 12/26/2019   NA 139 12/26/2019   K 4.2 12/26/2019   CL 102 12/26/2019   CREATININE 1.26 12/26/2019   BUN 22 12/26/2019   CO2 28 12/26/2019   TSH 2.14 12/26/2019   PSA 2.34 12/26/2019   INR 1.02 10/08/2015   HGBA1C 7.1 (H) 12/26/2019    VAS [place] LOWER EXTREMITY VENOUS (DVT)  Result Date: 08/01/2019  Lower Venous DVT Study Indications: Edema, Pain, and Swelling. Other Indications: Patient reports that lower extremity edema has resolved. Risk Factors: Surgery Recent back surgery. Performing Technologist: Delorise Shiner RVT  Examination Guidelines: A complete evaluation includes B-mode imaging, spectral Doppler, color Doppler, and power Doppler as needed of all accessible portions of each vessel. Bilateral testing is considered an integral part of a complete examination. Limited examinations for reoccurring indications may be performed as noted. The reflux portion of the exam is performed with the patient in reverse Trendelenburg.   +---------+---------------+---------+-----------+----------+--------------+ RIGHT    CompressibilityPhasicitySpontaneityPropertiesThrombus Aging +---------+---------------+---------+-----------+----------+--------------+ CFV      Full           Yes      Yes                                 +---------+---------------+---------+-----------+----------+--------------+ SFJ      Full           Yes      Yes                                 +---------+---------------+---------+-----------+----------+--------------+  FV Prox  Full           Yes      Yes                                 +---------+---------------+---------+-----------+----------+--------------+ FV Mid   Full           Yes      Yes                                 +---------+---------------+---------+-----------+----------+--------------+ FV DistalFull           Yes      Yes                                 +---------+---------------+---------+-----------+----------+--------------+ PFV      Full                    Yes                                 +---------+---------------+---------+-----------+----------+--------------+ POP      Full           Yes      Yes                                 +---------+---------------+---------+-----------+----------+--------------+ PTV      Full                                                        +---------+---------------+---------+-----------+----------+--------------+ PERO     Full                                                        +---------+---------------+---------+-----------+----------+--------------+ GSV      Full                    Yes                                 +---------+---------------+---------+-----------+----------+--------------+ SSV      Full                                                        +---------+---------------+---------+-----------+----------+--------------+    +---------+---------------+---------+-----------+----------+--------------+ LEFT     CompressibilityPhasicitySpontaneityPropertiesThrombus Aging +---------+---------------+---------+-----------+----------+--------------+ CFV      Full           Yes      Yes                                 +---------+---------------+---------+-----------+----------+--------------+ SFJ  Full           Yes      Yes                                 +---------+---------------+---------+-----------+----------+--------------+ FV Prox  Full           Yes      Yes                                 +---------+---------------+---------+-----------+----------+--------------+ FV Mid   Full           Yes      Yes                                 +---------+---------------+---------+-----------+----------+--------------+ FV DistalFull           Yes      Yes                                 +---------+---------------+---------+-----------+----------+--------------+ PFV      Full                    Yes                                 +---------+---------------+---------+-----------+----------+--------------+ POP      Full           Yes      Yes                                 +---------+---------------+---------+-----------+----------+--------------+ PTV      Full                    Yes                                 +---------+---------------+---------+-----------+----------+--------------+ PERO     Full                    Yes                                 +---------+---------------+---------+-----------+----------+--------------+ GSV      Full                    Yes                                 +---------+---------------+---------+-----------+----------+--------------+ SSV      Full                                                        +---------+---------------+---------+-----------+----------+--------------+     Summary: BILATERAL: - No evidence of deep  vein thrombosis seen in the lower extremities, bilaterally. - No evidence of superficial venous thrombosis in the  lower extremities, bilaterally. -No evidence of popliteal cyst, bilaterally.   *See table(s) above for measurements and observations. Electronically signed by Deitra Mayo MD on 08/01/2019 at 12:45:24 PM.    Final     Assessment & Plan:    Walker Kehr, MD

## 2020-04-01 NOTE — Assessment & Plan Note (Signed)
On Pravachol 

## 2020-04-01 NOTE — Assessment & Plan Note (Addendum)
Using a cane [employer]

## 2020-04-11 ENCOUNTER — Other Ambulatory Visit: Payer: Self-pay | Admitting: Internal Medicine

## 2020-06-19 ENCOUNTER — Telehealth: Payer: Self-pay | Admitting: Internal Medicine

## 2020-06-19 NOTE — Telephone Encounter (Signed)
LVM for pt to rtn my call to schedule AWV with [facility] on 07/01/20 at 10:30 an then see pcp. Please schedule this appt if pt calls the office.

## 2020-07-01 ENCOUNTER — Encounter: Payer: Self-pay | Admitting: Internal Medicine

## 2020-07-01 ENCOUNTER — Ambulatory Visit (INDEPENDENT_AMBULATORY_CARE_PROVIDER_SITE_OTHER): Payer: Medicare Other | Admitting: Internal Medicine

## 2020-07-01 ENCOUNTER — Other Ambulatory Visit: Payer: Self-pay

## 2020-07-01 DIAGNOSIS — R269 Unspecified abnormalities of gait and mobility: Secondary | ICD-10-CM

## 2020-07-01 DIAGNOSIS — M5440 Lumbago with sciatica, unspecified side: Secondary | ICD-10-CM | POA: Diagnosis not present

## 2020-07-01 DIAGNOSIS — N1831 Chronic kidney disease, stage 3a: Secondary | ICD-10-CM

## 2020-07-01 DIAGNOSIS — G8929 Other chronic pain: Secondary | ICD-10-CM | POA: Diagnosis not present

## 2020-07-01 DIAGNOSIS — E1122 Type 2 diabetes mellitus with diabetic chronic kidney disease: Secondary | ICD-10-CM

## 2020-07-01 DIAGNOSIS — I2510 Atherosclerotic heart disease of native coronary artery without angina pectoris: Secondary | ICD-10-CM | POA: Diagnosis not present

## 2020-07-01 DIAGNOSIS — R270 Ataxia, unspecified: Secondary | ICD-10-CM

## 2020-07-01 LAB — BASIC METABOLIC PANEL
BUN: 29 mg/dL — ABNORMAL HIGH (ref 6–23)
CO2: 28 mEq/L (ref 19–32)
Calcium: 10.1 mg/dL (ref 8.4–10.5)
Chloride: 100 mEq/L (ref 96–112)
Creatinine, Ser: 1.35 mg/dL (ref 0.40–1.50)
GFR: 48.63 mL/min — ABNORMAL LOW (ref >60.00)
Glucose, Bld: 134 mg/dL — ABNORMAL HIGH (ref 70–99)
Potassium: 3.7 mEq/L (ref 3.5–5.1)
Sodium: 138 mEq/L (ref 135–145)

## 2020-07-01 LAB — HEMOGLOBIN A1C: Hgb A1c MFr Bld: 7.3 % — ABNORMAL HIGH (ref 4.6–6.5)

## 2020-07-01 NOTE — Assessment & Plan Note (Signed)
Pt has enrolled in the [facility] program for $7000 - he was promised to get better. C/o LE weakness, gait disorder. No pain, no LBP

## 2020-07-01 NOTE — Assessment & Plan Note (Signed)
Pt has enrolled in the [facility] program for $7000 - he was promised to get better. C/o LE weakness, gait disorder. No pain, no LBP

## 2020-07-01 NOTE — Progress Notes (Signed)
Subjective:  Patient ID: Jeff Burgess, male    DOB: June 21, 1935  Age: 85 y.o. MRN: [id number]  CC: Follow-up (3 MONTH F/U)   HPI Jeff Burgess presents for LBP, gait disorder, HTN Pt has enrolled in the [facility] program for $7000 - he was promised to get better. C/o LE weakness, gait disorder. No pain, no LBP  Outpatient Medications Prior to Visit  Medication Sig Dispense Refill   amLODipine (NORVASC) 5 MG tablet TAKE 1 TABLET BY MOUTH  DAILY 90 tablet 3   aspirin 81 MG tablet Take 2 tablets (162 mg total) by mouth daily. (Patient taking differently: Take 81 mg by mouth in the morning and at bedtime.) 100 tablet 3   cholecalciferol (VITAMIN D) 25 MCG (1000 UT) tablet Take 1 tablet (1,000 Units total) by mouth every morning. Follow-up due in April must see provider for refills (Patient taking differently: Take 1,000 Units by mouth daily. Follow-up due in April must see provider for refills) 90 tablet 0   metFORMIN (GLUCOPHAGE-XR) 500 MG 24 hr tablet TAKE 1 TABLET BY MOUTH  TWICE DAILY 180 tablet 2   methocarbamol (ROBAXIN) 500 MG tablet Take 1 tablet (500 mg total) by mouth every 6 (six) hours as needed for muscle spasms. 60 tablet 1   oxyCODONE (OXY IR/ROXICODONE) 5 MG immediate release tablet Take 1 tablet (5 mg total) by mouth every 3 (three) hours as needed for moderate pain ((score 4 to 6)). 30 tablet 0   pravastatin (PRAVACHOL) 40 MG tablet TAKE 1 TABLET BY MOUTH  DAILY 90 tablet 3   repaglinide (PRANDIN) 1 MG tablet Take 1 tablet (1 mg total) by mouth 3 (three) times daily before meals. 90 tablet 11   telmisartan-hydrochlorothiazide (MICARDIS HCT) 80-25 MG tablet TAKE 1 TABLET BY MOUTH  DAILY 90 tablet 3   No facility-administered medications prior to visit.    ROS: Review of Systems  Constitutional:  Negative for appetite change, fatigue and unexpected weight change.  HENT:  Negative for congestion, nosebleeds, sneezing, sore throat and trouble swallowing.   Eyes:   Negative for itching and visual disturbance.  Respiratory:  Negative for cough.   Cardiovascular:  Negative for chest pain, palpitations and leg swelling.  Gastrointestinal:  Negative for abdominal distention, blood in stool, diarrhea and nausea.  Genitourinary:  Negative for frequency and hematuria.  Musculoskeletal:  Positive for gait problem. Negative for back pain, joint swelling and neck pain.  Skin:  Negative for rash.  Neurological:  Positive for weakness. Negative for dizziness, tremors and speech difficulty.  Psychiatric/Behavioral:  Negative for agitation, dysphoric mood and sleep disturbance. The patient is not nervous/anxious.    Objective:  BP (!) 148/70 (BP Location: Left Arm)   Pulse 81   Temp 98 F (36.7 C) (Oral)   Ht 5\' 9"  (1.753 m)   Wt 194 lb 3.2 oz (88.1 kg)   SpO2 97%   BMI 28.68 kg/m   BP Readings from Last 3 Encounters:  07/01/20 (!) 148/70  04/01/20 (!) 148/72  12/26/19 (!) 152/82    Wt Readings from Last 3 Encounters:  07/01/20 194 lb 3.2 oz (88.1 kg)  04/01/20 193 lb 6.4 oz (87.7 kg)  12/26/19 194 lb 12.8 oz (88.4 kg)    Physical Exam Constitutional:      General: He is not in acute distress.    Appearance: He is well-developed. He is obese.     Comments: NAD  Eyes:     Conjunctiva/sclera: Conjunctivae normal.  Pupils: Pupils are equal, round, and reactive to light.  Neck:     Thyroid: No thyromegaly.     Vascular: No JVD.  Cardiovascular:     Rate and Rhythm: Normal rate and regular rhythm.     Heart sounds: Normal heart sounds. No murmur heard.   No friction rub. No gallop.  Pulmonary:     Effort: Pulmonary effort is normal. No respiratory distress.     Breath sounds: Normal breath sounds. No wheezing or rales.  Chest:     Chest wall: No tenderness.  Abdominal:     General: Bowel sounds are normal. There is no distension.     Palpations: Abdomen is soft. There is no mass.     Tenderness: There is no abdominal tenderness. There is  no guarding or rebound.  Musculoskeletal:        General: No tenderness. Normal range of motion.     Cervical back: Normal range of motion.  Lymphadenopathy:     Cervical: No cervical adenopathy.  Skin:    General: Skin is warm and dry.     Findings: No rash.  Neurological:     Mental Status: He is alert and oriented to person, place, and time.     Cranial Nerves: No cranial nerve deficit.     Motor: Weakness present. No abnormal muscle tone.     Coordination: Coordination normal.     Gait: Gait abnormal.     Deep Tendon Reflexes: Reflexes abnormal.  Psychiatric:        Behavior: Behavior normal.        Thought Content: Thought content normal.        Judgment: Judgment normal.   Using a cane Weak legs Abn gait - unsteady  Lab Results  Component Value Date   WBC 10.2 12/26/2019   HGB 13.6 12/26/2019   HCT 39.9 12/26/2019   PLT 229.0 12/26/2019   GLUCOSE 160 (H) 04/01/2020   CHOL 123 12/26/2019   TRIG 170.0 (H) 12/26/2019   HDL 33.80 (L) 12/26/2019   LDLDIRECT 78.4 11/02/2011   LDLCALC 55 12/26/2019   ALT 21 12/26/2019   AST 18 12/26/2019   NA 137 04/01/2020   K 4.1 04/01/2020   CL 101 04/01/2020   CREATININE 1.21 04/01/2020   BUN 27 (H) 04/01/2020   CO2 28 04/01/2020   TSH 2.14 12/26/2019   PSA 2.34 12/26/2019   INR 1.02 10/08/2015   HGBA1C 7.2 (H) 04/01/2020    VAS [place] LOWER EXTREMITY VENOUS (DVT)  Result Date: 08/01/2019  Lower Venous DVT Study Indications: Edema, Pain, and Swelling. Other Indications: Patient reports that lower extremity edema has resolved. Risk Factors: Surgery Recent back surgery. Performing Technologist: Delorise Shiner RVT  Examination Guidelines: A complete evaluation includes B-mode imaging, spectral Doppler, color Doppler, and power Doppler as needed of all accessible portions of each vessel. Bilateral testing is considered an integral part of a complete examination. Limited examinations for reoccurring indications may be performed as  noted. The reflux portion of the exam is performed with the patient in reverse Trendelenburg.  +---------+---------------+---------+-----------+----------+--------------+ RIGHT    CompressibilityPhasicitySpontaneityPropertiesThrombus Aging +---------+---------------+---------+-----------+----------+--------------+ CFV      Full           Yes      Yes                                 +---------+---------------+---------+-----------+----------+--------------+ SFJ      Full  Yes      Yes                                 +---------+---------------+---------+-----------+----------+--------------+ FV Prox  Full           Yes      Yes                                 +---------+---------------+---------+-----------+----------+--------------+ FV Mid   Full           Yes      Yes                                 +---------+---------------+---------+-----------+----------+--------------+ FV DistalFull           Yes      Yes                                 +---------+---------------+---------+-----------+----------+--------------+ PFV      Full                    Yes                                 +---------+---------------+---------+-----------+----------+--------------+ POP      Full           Yes      Yes                                 +---------+---------------+---------+-----------+----------+--------------+ PTV      Full                                                        +---------+---------------+---------+-----------+----------+--------------+ PERO     Full                                                        +---------+---------------+---------+-----------+----------+--------------+ GSV      Full                    Yes                                 +---------+---------------+---------+-----------+----------+--------------+ SSV      Full                                                         +---------+---------------+---------+-----------+----------+--------------+   +---------+---------------+---------+-----------+----------+--------------+ LEFT     CompressibilityPhasicitySpontaneityPropertiesThrombus Aging +---------+---------------+---------+-----------+----------+--------------+ CFV      Full           Yes  Yes                                 +---------+---------------+---------+-----------+----------+--------------+ SFJ      Full           Yes      Yes                                 +---------+---------------+---------+-----------+----------+--------------+ FV Prox  Full           Yes      Yes                                 +---------+---------------+---------+-----------+----------+--------------+ FV Mid   Full           Yes      Yes                                 +---------+---------------+---------+-----------+----------+--------------+ FV DistalFull           Yes      Yes                                 +---------+---------------+---------+-----------+----------+--------------+ PFV      Full                    Yes                                 +---------+---------------+---------+-----------+----------+--------------+ POP      Full           Yes      Yes                                 +---------+---------------+---------+-----------+----------+--------------+ PTV      Full                    Yes                                 +---------+---------------+---------+-----------+----------+--------------+ PERO     Full                    Yes                                 +---------+---------------+---------+-----------+----------+--------------+ GSV      Full                    Yes                                 +---------+---------------+---------+-----------+----------+--------------+ SSV      Full                                                         +---------+---------------+---------+-----------+----------+--------------+  Summary: BILATERAL: - No evidence of deep vein thrombosis seen in the lower extremities, bilaterally. - No evidence of superficial venous thrombosis in the lower extremities, bilaterally. -No evidence of popliteal cyst, bilaterally.   *See table(s) above for measurements and observations. Electronically signed by Deitra Mayo MD on 08/01/2019 at 12:45:24 PM.    Final     Assessment & Plan:   There are no diagnoses linked to this encounter.   Walker Kehr, MD

## 2020-07-01 NOTE — Patient Instructions (Signed)
Upright walker

## 2020-07-01 NOTE — Assessment & Plan Note (Signed)
Metformin 500 mg bid Check A1c

## 2020-07-01 NOTE — Assessment & Plan Note (Signed)
Cont w/ASA, Amlodipine, Pravastatin

## 2020-07-01 NOTE — Assessment & Plan Note (Signed)
No LBP now Pt has enrolled in the [facility] program for $7000 - he was promised to get better. C/o LE weakness, gait disorder. No pain, no LBP

## 2020-08-18 DIAGNOSIS — L570 Actinic keratosis: Secondary | ICD-10-CM | POA: Diagnosis not present

## 2020-08-18 DIAGNOSIS — L821 Other seborrheic keratosis: Secondary | ICD-10-CM | POA: Diagnosis not present

## 2020-08-18 DIAGNOSIS — D229 Melanocytic nevi, unspecified: Secondary | ICD-10-CM | POA: Diagnosis not present

## 2020-08-18 DIAGNOSIS — L218 Other seborrheic dermatitis: Secondary | ICD-10-CM | POA: Diagnosis not present

## 2020-08-18 DIAGNOSIS — D1801 Hemangioma of skin and subcutaneous tissue: Secondary | ICD-10-CM | POA: Diagnosis not present

## 2020-08-18 DIAGNOSIS — D485 Neoplasm of uncertain behavior of skin: Secondary | ICD-10-CM | POA: Diagnosis not present

## 2020-08-20 ENCOUNTER — Other Ambulatory Visit: Payer: Self-pay | Admitting: Internal Medicine

## 2020-09-08 ENCOUNTER — Telehealth: Payer: Self-pay | Admitting: Lab

## 2020-09-08 NOTE — Progress Notes (Signed)
  Chronic Care Management   Outreach Note  09/08/2020 Name: KYREE PERREN MRN: [id number] DOB: Mar 24, 1935  Referred by: Cassandria Anger, MD Reason for referral : Medication Management   An unsuccessful telephone outreach was attempted today. The patient was referred to the pharmacist for assistance with care management and care coordination.   Follow Up Plan:   [employer]

## 2020-09-17 DIAGNOSIS — H2513 Age-related nuclear cataract, bilateral: Secondary | ICD-10-CM | POA: Diagnosis not present

## 2020-09-17 DIAGNOSIS — E119 Type 2 diabetes mellitus without complications: Secondary | ICD-10-CM | POA: Diagnosis not present

## 2020-09-17 LAB — HM DIABETES EYE EXAM

## 2020-09-18 ENCOUNTER — Encounter: Payer: Self-pay | Admitting: Internal Medicine

## 2020-09-23 ENCOUNTER — Telehealth: Payer: Self-pay | Admitting: Lab

## 2020-09-23 NOTE — Chronic Care Management (AMB) (Signed)
  Chronic Care Management   Note  09/23/2020 Name: DEXTER WILBOURNE MRN: [id number] DOB: [birth year]  SHANE RIPPEON is a 85 y.o. year old male who is a primary care patient of Plotnikov, Evie Lacks, MD. I reached out to Garner Gavel by phone today in response to a referral sent by Mr. Keisuke Aldred Locker's PCP, Plotnikov, Evie Lacks, MD.   Mr. Hemp was given information about Chronic Care Management services today including:  CCM service includes personalized support from designated clinical staff supervised by his physician, including individualized plan of care and coordination with other care providers 24/7 contact phone numbers for assistance for urgent and routine care needs. Service will only be billed when office clinical staff spend 20 minutes or more in a month to coordinate care. Only one practitioner may furnish and bill the service in a calendar month. The patient may stop CCM services at any time (effective at the end of the month) by phone call to the office staff.   Patient agreed to services and verbal consent obtained.   Follow up plan:  [place]

## 2020-10-01 ENCOUNTER — Ambulatory Visit: Payer: Medicare Other | Admitting: Internal Medicine

## 2020-10-22 ENCOUNTER — Other Ambulatory Visit: Payer: Self-pay

## 2020-10-22 ENCOUNTER — Encounter: Payer: Self-pay | Admitting: Internal Medicine

## 2020-10-22 ENCOUNTER — Ambulatory Visit (INDEPENDENT_AMBULATORY_CARE_PROVIDER_SITE_OTHER): Payer: Medicare Other | Admitting: Internal Medicine

## 2020-10-22 VITALS — BP 132/72 | HR 89 | Temp 98.0°F | Ht 69.0 in | Wt 194.2 lb

## 2020-10-22 DIAGNOSIS — E1122 Type 2 diabetes mellitus with diabetic chronic kidney disease: Secondary | ICD-10-CM

## 2020-10-22 DIAGNOSIS — E785 Hyperlipidemia, unspecified: Secondary | ICD-10-CM | POA: Diagnosis not present

## 2020-10-22 DIAGNOSIS — G8929 Other chronic pain: Secondary | ICD-10-CM | POA: Diagnosis not present

## 2020-10-22 DIAGNOSIS — I10 Essential (primary) hypertension: Secondary | ICD-10-CM

## 2020-10-22 DIAGNOSIS — R270 Ataxia, unspecified: Secondary | ICD-10-CM | POA: Diagnosis not present

## 2020-10-22 DIAGNOSIS — M5440 Lumbago with sciatica, unspecified side: Secondary | ICD-10-CM

## 2020-10-22 DIAGNOSIS — R269 Unspecified abnormalities of gait and mobility: Secondary | ICD-10-CM

## 2020-10-22 DIAGNOSIS — N1831 Chronic kidney disease, stage 3a: Secondary | ICD-10-CM | POA: Diagnosis not present

## 2020-10-22 DIAGNOSIS — I2510 Atherosclerotic heart disease of native coronary artery without angina pectoris: Secondary | ICD-10-CM | POA: Diagnosis not present

## 2020-10-22 DIAGNOSIS — Z23 Encounter for immunization: Secondary | ICD-10-CM | POA: Diagnosis not present

## 2020-10-22 LAB — COMPREHENSIVE METABOLIC PANEL
ALT: 22 U/L (ref 0–53)
AST: 19 U/L (ref 0–37)
Albumin: 4.5 g/dL (ref 3.5–5.2)
Alkaline Phosphatase: 92 U/L (ref 39–117)
BUN: 25 mg/dL — ABNORMAL HIGH (ref 6–23)
CO2: 30 mEq/L (ref 19–32)
Calcium: 9.8 mg/dL (ref 8.4–10.5)
Chloride: 102 mEq/L (ref 96–112)
Creatinine, Ser: 1.29 mg/dL (ref 0.40–1.50)
GFR: 51.24 mL/min — ABNORMAL LOW (ref >60.00)
Glucose, Bld: 149 mg/dL — ABNORMAL HIGH (ref 70–99)
Potassium: 4.6 mEq/L (ref 3.5–5.1)
Sodium: 140 mEq/L (ref 135–145)
Total Bilirubin: 0.5 mg/dL (ref 0.2–1.2)
Total Protein: 7.1 g/dL (ref 6.0–8.3)

## 2020-10-22 LAB — HEMOGLOBIN A1C: Hgb A1c MFr Bld: 6.9 % — ABNORMAL HIGH (ref 4.6–6.5)

## 2020-10-22 LAB — URIC ACID: Uric Acid, Serum: 7.7 mg/dL (ref 4.0–7.8)

## 2020-10-22 MED ORDER — CELECOXIB 100 MG PO CAPS: 100.0000 mg | ORAL_CAPSULE | Freq: Every day | ORAL | 1 refills | Status: DC | PRN

## 2020-10-22 NOTE — Addendum Note (Signed)
Addended by: Jacobo Forest on: 10/22/2020 02:30 PM   Modules accepted: Orders

## 2020-10-22 NOTE — Assessment & Plan Note (Signed)
PT suggested 

## 2020-10-22 NOTE — Assessment & Plan Note (Signed)
Pt wants to try Celebrex - Rx given. Take no more than twice per week due to CKD

## 2020-10-22 NOTE — Assessment & Plan Note (Signed)
Cont on Telmisartan-HCT, Amlod 5 mg/d

## 2020-10-22 NOTE — Assessment & Plan Note (Signed)
No CP ASA, Amlodipine, Pravastatin

## 2020-10-22 NOTE — Assessment & Plan Note (Signed)
Will check A1c Fasting 24 h a week - hold Prandin and use 1 Metformin if fasting  Metformin 500 mg bid

## 2020-10-22 NOTE — Assessment & Plan Note (Signed)
Silver sneakers Chair yoga We can do PT - Physical therapy

## 2020-10-22 NOTE — Patient Instructions (Signed)
Silver sneakers Chair yoga We can do PT - Physical therapy

## 2020-10-22 NOTE — Assessment & Plan Note (Signed)
On Pravachol 

## 2020-10-22 NOTE — Progress Notes (Signed)
Subjective:  Patient ID: Jeff Burgess, male    DOB: [birth year]  Age: 85 y.o. MRN: [id number]  CC: Follow-up (3 month f/u- Flu shot)   HPI SONNY ANTHES presents for LBP, gait disorder - not better; DM, CAD. F/u HTN, LE weakness  Outpatient Medications Prior to Visit  Medication Sig Dispense Refill   amLODipine (NORVASC) 5 MG tablet TAKE 1 TABLET BY MOUTH  DAILY 90 tablet 3   aspirin 81 MG tablet Take 2 tablets (162 mg total) by mouth daily. (Patient taking differently: Take 81 mg by mouth in the morning and at bedtime.) 100 tablet 3   cholecalciferol (VITAMIN D) 25 MCG (1000 UT) tablet Take 1 tablet (1,000 Units total) by mouth every morning. Follow-up due in April must see provider for refills (Patient taking differently: Take 1,000 Units by mouth daily. Follow-up due in April must see provider for refills) 90 tablet 0   metFORMIN (GLUCOPHAGE-XR) 500 MG 24 hr tablet TAKE 1 TABLET BY MOUTH  TWICE DAILY 180 tablet 3   methocarbamol (ROBAXIN) 500 MG tablet Take 1 tablet (500 mg total) by mouth every 6 (six) hours as needed for muscle spasms. 60 tablet 1   oxyCODONE (OXY IR/ROXICODONE) 5 MG immediate release tablet Take 1 tablet (5 mg total) by mouth every 3 (three) hours as needed for moderate pain ((score 4 to 6)). 30 tablet 0   pravastatin (PRAVACHOL) 40 MG tablet TAKE 1 TABLET BY MOUTH  DAILY 90 tablet 3   repaglinide (PRANDIN) 1 MG tablet Take 1 tablet (1 mg total) by mouth 3 (three) times daily before meals. 90 tablet 11   telmisartan-hydrochlorothiazide (MICARDIS HCT) 80-25 MG tablet TAKE 1 TABLET BY MOUTH  DAILY 90 tablet 3   No facility-administered medications prior to visit.    ROS: Review of Systems  Constitutional:  Negative for appetite change, fatigue and unexpected weight change.  HENT:  Negative for congestion, nosebleeds, sneezing, sore throat and trouble swallowing.   Eyes:  Negative for itching and visual disturbance.  Respiratory:  Negative for cough.    Cardiovascular:  Negative for chest pain, palpitations and leg swelling.  Gastrointestinal:  Negative for abdominal distention, blood in stool, diarrhea and nausea.  Genitourinary:  Negative for frequency and hematuria.  Musculoskeletal:  Positive for arthralgias, back pain and gait problem. Negative for joint swelling and neck pain.  Skin:  Negative for rash.  Neurological:  Positive for weakness. Negative for dizziness, tremors and speech difficulty.  Psychiatric/Behavioral:  Negative for agitation, dysphoric mood, sleep disturbance and suicidal ideas. The patient is not nervous/anxious.    Objective:  BP 132/72 (BP Location: Left Arm)   Pulse 89   Temp 98 F (36.7 C) (Oral)   Ht 5\' 9"  (1.753 m)   Wt 194 lb 3.2 oz (88.1 kg)   SpO2 97%   BMI 28.68 kg/m   BP Readings from Last 3 Encounters:  10/22/20 132/72  07/01/20 (!) 148/70  04/01/20 (!) 148/72    Wt Readings from Last 3 Encounters:  10/22/20 194 lb 3.2 oz (88.1 kg)  07/01/20 194 lb 3.2 oz (88.1 kg)  04/01/20 193 lb 6.4 oz (87.7 kg)    Physical Exam Constitutional:      General: He is not in acute distress.    Appearance: He is well-developed.     Comments: NAD  Eyes:     Conjunctiva/sclera: Conjunctivae normal.     Pupils: Pupils are equal, round, and reactive to light.  Neck:  Thyroid: No thyromegaly.     Vascular: No JVD.  Cardiovascular:     Rate and Rhythm: Normal rate and regular rhythm.     Heart sounds: Normal heart sounds. No murmur heard.   No friction rub. No gallop.  Pulmonary:     Effort: Pulmonary effort is normal. No respiratory distress.     Breath sounds: Normal breath sounds. No wheezing or rales.  Chest:     Chest wall: No tenderness.  Abdominal:     General: Bowel sounds are normal. There is no distension.     Palpations: Abdomen is soft. There is no mass.     Tenderness: There is no abdominal tenderness. There is no guarding or rebound.  Musculoskeletal:        General: Tenderness  present. Normal range of motion.     Cervical back: Normal range of motion.  Lymphadenopathy:     Cervical: No cervical adenopathy.  Skin:    General: Skin is warm and dry.     Findings: No rash.  Neurological:     Mental Status: He is alert and oriented to person, place, and time.     Cranial Nerves: No cranial nerve deficit.     Motor: Weakness present. No abnormal muscle tone.     Coordination: Coordination abnormal.     Gait: Gait abnormal.     Deep Tendon Reflexes: Reflexes are normal and symmetric.  Psychiatric:        Behavior: Behavior normal.        Thought Content: Thought content normal.        Judgment: Judgment normal.  Using a staff Weak legs, ataxic  Lab Results  Component Value Date   WBC 10.2 12/26/2019   HGB 13.6 12/26/2019   HCT 39.9 12/26/2019   PLT 229.0 12/26/2019   GLUCOSE 134 (H) 07/01/2020   CHOL 123 12/26/2019   TRIG 170.0 (H) 12/26/2019   HDL 33.80 (L) 12/26/2019   LDLDIRECT 78.4 11/02/2011   LDLCALC 55 12/26/2019   ALT 21 12/26/2019   AST 18 12/26/2019   NA 138 07/01/2020   K 3.7 07/01/2020   CL 100 07/01/2020   CREATININE 1.35 07/01/2020   BUN 29 (H) 07/01/2020   CO2 28 07/01/2020   TSH 2.14 12/26/2019   PSA 2.34 12/26/2019   INR 1.02 10/08/2015   HGBA1C 7.3 (H) 07/01/2020    VAS [place] LOWER EXTREMITY VENOUS (DVT)  Result Date: 08/01/2019  Lower Venous DVT Study Indications: Edema, Pain, and Swelling. Other Indications: Patient reports that lower extremity edema has resolved. Risk Factors: Surgery Recent back surgery. Performing Technologist: Delorise Shiner RVT  Examination Guidelines: A complete evaluation includes B-mode imaging, spectral Doppler, color Doppler, and power Doppler as needed of all accessible portions of each vessel. Bilateral testing is considered an integral part of a complete examination. Limited examinations for reoccurring indications may be performed as noted. The reflux portion of the exam is performed with the patient  in reverse Trendelenburg.  +---------+---------------+---------+-----------+----------+--------------+ RIGHT    CompressibilityPhasicitySpontaneityPropertiesThrombus Aging +---------+---------------+---------+-----------+----------+--------------+ CFV      Full           Yes      Yes                                 +---------+---------------+---------+-----------+----------+--------------+ SFJ      Full           Yes  Yes                                 +---------+---------------+---------+-----------+----------+--------------+ FV Prox  Full           Yes      Yes                                 +---------+---------------+---------+-----------+----------+--------------+ FV Mid   Full           Yes      Yes                                 +---------+---------------+---------+-----------+----------+--------------+ FV DistalFull           Yes      Yes                                 +---------+---------------+---------+-----------+----------+--------------+ PFV      Full                    Yes                                 +---------+---------------+---------+-----------+----------+--------------+ POP      Full           Yes      Yes                                 +---------+---------------+---------+-----------+----------+--------------+ PTV      Full                                                        +---------+---------------+---------+-----------+----------+--------------+ PERO     Full                                                        +---------+---------------+---------+-----------+----------+--------------+ GSV      Full                    Yes                                 +---------+---------------+---------+-----------+----------+--------------+ SSV      Full                                                        +---------+---------------+---------+-----------+----------+--------------+    +---------+---------------+---------+-----------+----------+--------------+ LEFT     CompressibilityPhasicitySpontaneityPropertiesThrombus Aging +---------+---------------+---------+-----------+----------+--------------+ CFV      Full           Yes      Yes                                 +---------+---------------+---------+-----------+----------+--------------+  SFJ      Full           Yes      Yes                                 +---------+---------------+---------+-----------+----------+--------------+ FV Prox  Full           Yes      Yes                                 +---------+---------------+---------+-----------+----------+--------------+ FV Mid   Full           Yes      Yes                                 +---------+---------------+---------+-----------+----------+--------------+ FV DistalFull           Yes      Yes                                 +---------+---------------+---------+-----------+----------+--------------+ PFV      Full                    Yes                                 +---------+---------------+---------+-----------+----------+--------------+ POP      Full           Yes      Yes                                 +---------+---------------+---------+-----------+----------+--------------+ PTV      Full                    Yes                                 +---------+---------------+---------+-----------+----------+--------------+ PERO     Full                    Yes                                 +---------+---------------+---------+-----------+----------+--------------+ GSV      Full                    Yes                                 +---------+---------------+---------+-----------+----------+--------------+ SSV      Full                                                        +---------+---------------+---------+-----------+----------+--------------+     Summary: BILATERAL: - No evidence of deep  vein thrombosis seen in the lower extremities, bilaterally. - No evidence  of superficial venous thrombosis in the lower extremities, bilaterally. -No evidence of popliteal cyst, bilaterally.   *See table(s) above for measurements and observations. Electronically signed by Deitra Mayo MD on 08/01/2019 at 12:45:24 PM.    Final     Assessment & Plan:   Problem List Items Addressed This Visit     Ataxia    PT suggested      Coronary atherosclerosis    No CP ASA, Amlodipine, Pravastatin      DM (diabetes mellitus), type 2 with renal complications (HCC)    Will check A1c Fasting 24 h a week - hold Prandin and use 1 Metformin if fasting  Metformin 500 mg bid      Relevant Orders   Comprehensive metabolic panel   Hemoglobin A1c   Uric acid   Dyslipidemia    On Pravachol      Essential hypertension    Cont on Telmisartan-HCT, Amlod 5 mg/d      Gait disorder    Silver sneakers Chair yoga We can do PT - Physical therapy      Relevant Orders   Comprehensive metabolic panel   LOW BACK PAIN    Pt wants to try Celebrex - Rx given. Take no more than twice per week due to CKD      Relevant Medications   celecoxib (CELEBREX) 100 MG capsule   Other Relevant Orders   Uric acid   Other Visit Diagnoses     Needs flu shot    -  Primary   Relevant Orders   Flu Vaccine QUAD High Dose(Fluad) (Completed)         Follow-up: Return in about 3 months (around 01/22/2021) for a follow-up visit.  Walker Kehr, MD

## 2020-11-11 ENCOUNTER — Telehealth: Payer: Self-pay

## 2020-11-11 NOTE — Progress Notes (Signed)
    Chronic Care Management Pharmacy Assistant   Name: Jeff Burgess  MRN: [id number] DOB: 25-Aug-1935  Jeff Burgess is an 85 y.o. year old male who presents for his initial CCM visit with the clinical pharmacist.  Reason for Encounter: Initial Chart Prep    Recent office visits:  10/22/20 Plotnikov, Evie Lacks, MD-PCP (flu shot) Orders: cmp, hen a1c, uric acid Medication Changes:celecoxib (CELEBREX) 100 MG capsule  07/01/20 Plotnikov, Evie Lacks, MD-PCP (Gait disorder) Orders: bmp, Hem a1c  Recent consult visits:  None ID  Hospital visits:  None in previous 6 months  Medications: Outpatient Encounter Medications as of 11/11/2020  Medication Sig   amLODipine (NORVASC) 5 MG tablet TAKE 1 TABLET BY MOUTH  DAILY   aspirin 81 MG tablet Take 2 tablets (162 mg total) by mouth daily. (Patient taking differently: Take 81 mg by mouth in the morning and at bedtime.)   celecoxib (CELEBREX) 100 MG capsule Take 1 capsule (100 mg total) by mouth daily as needed for moderate pain (Take no more than twice per week).   cholecalciferol (VITAMIN D) 25 MCG (1000 UT) tablet Take 1 tablet (1,000 Units total) by mouth every morning. Follow-up due in April must see provider for refills (Patient taking differently: Take 1,000 Units by mouth daily. Follow-up due in April must see provider for refills)   metFORMIN (GLUCOPHAGE-XR) 500 MG 24 hr tablet TAKE 1 TABLET BY MOUTH  TWICE DAILY   methocarbamol (ROBAXIN) 500 MG tablet Take 1 tablet (500 mg total) by mouth every 6 (six) hours as needed for muscle spasms.   oxyCODONE (OXY IR/ROXICODONE) 5 MG immediate release tablet Take 1 tablet (5 mg total) by mouth every 3 (three) hours as needed for moderate pain ((score 4 to 6)).   pravastatin (PRAVACHOL) 40 MG tablet TAKE 1 TABLET BY MOUTH  DAILY   repaglinide (PRANDIN) 1 MG tablet Take 1 tablet (1 mg total) by mouth 3 (three) times daily before meals.   telmisartan-hydrochlorothiazide (MICARDIS HCT) 80-25 MG  tablet TAKE 1 TABLET BY MOUTH  DAILY   No facility-administered encounter medications on file as of 11/11/2020.   Medication List  amLODipine (NORVASC) 5 MG tablet- last fill 09/22/20 90 ds aspirin 81 MG tablet celecoxib (CELEBREX) 100 MG capsule-last fill 10/22/20 30 ds cholecalciferol (VITAMIN D) 25 MCG (1000 UT) tablet- last fill 01/23/18  metFORMIN (GLUCOPHAGE-XR) 500 MG 24 hr tablet-last fill 09/22/20 90 ds methocarbamol (ROBAXIN) 500 MG tablet-last fill 07/04/19 15 ds oxyCODONE (OXY IR/ROXICODONE) 5 MG immediate release tablet-last fill 07/23/19 pravastatin (PRAVACHOL) 40 MG tablet-last fill 09/22/20 90 ds repaglinide (PRANDIN) 1 MG tablet-last fill 06/26/19 30 ds telmisartan-hydrochlorothiazide (MICARDIS HCT) 80-25 M-last fill 09/22/20 90 ds  Care Gaps: Colonoscopy-NA Diabetic Foot Exam-09/19/17 Ophthalmology-09/17/20 Dexa Scan - NA Annual Well Visit - NA Micro albumin-NA Hemoglobin A1c- 10/22/20  Star Rating Drugs: metFORMIN  500 MG 24 last fill 09/22/20 90 ds Pravastatin 40 MG last fill 09/22/20 90 ds telmisartan-hydrochlorothiazide 80-25 Mg-last fill 09/22/20 90 ds  [place] Pharmacist Assistant [phone]

## 2020-11-12 ENCOUNTER — Telehealth: Payer: Medicare Other

## 2020-11-12 NOTE — Progress Notes (Deleted)
Chronic Care Management Pharmacy Note  11/12/2020 Name:  Jeff Burgess MRN:  [id number] DOB:  12-19-1935  Summary: ***  Recommendations/Changes made from today's visit: ***  Plan: ***  Subjective: Jeff Burgess is an 85 y.o. year old male who is a primary patient of Plotnikov, Evie Lacks, MD.  The CCM team was consulted for assistance with disease management and care coordination needs.    Engaged with patient by telephone for initial visit in response to provider referral for pharmacy case management and/or care coordination services.   Consent to Services:  The patient was given the following information about Chronic Care Management services today, agreed to services, and gave verbal consent: 1. CCM service includes personalized support from designated clinical staff supervised by the primary care provider, including individualized plan of care and coordination with other care providers 2. 24/7 contact phone numbers for assistance for urgent and routine care needs. 3. Service will only be billed when office clinical staff spend 20 minutes or more in a month to coordinate care. 4. Only one practitioner may furnish and bill the service in a calendar month. 5.The patient may stop CCM services at any time (effective at the end of the month) by phone call to the office staff. 6. The patient will be responsible for cost sharing (co-pay) of up to 20% of the service fee (after annual deductible is met). Patient agreed to services and consent obtained.  Patient Care Team: Cassandria Anger, MD as PCP - Lyda Perone, MD as Consulting Physician (Neurosurgery) Tomasa Blase, [facility] as Pharmacist (Pharmacist)  Recent office visits:  10/22/20 Plotnikov, Evie Lacks, MD-PCP (flu shot) Orders: cmp, hen a1c, uric acid Medication Changes:celecoxib (CELEBREX) 100 MG capsule - taken no more than 2 times per week in setting of CKD    07/01/20 Plotnikov, Evie Lacks, MD-PCP (Gait  disorder) Orders: bmp, Hem a1c   Recent consult visits:  None ID   Hospital visits:  None in previous 6 months  Objective:  Lab Results  Component Value Date   CREATININE 1.29 10/22/2020   BUN 25 (H) 10/22/2020   GFR 51.24 (L) 10/22/2020   GFRNONAA 50 (L) 10/08/2015   GFRAA 57 (L) 10/08/2015   NA 140 10/22/2020   K 4.6 10/22/2020   CALCIUM 9.8 10/22/2020   CO2 30 10/22/2020   GLUCOSE 149 (H) 10/22/2020    Lab Results  Component Value Date/Time   HGBA1C 6.9 (H) 10/22/2020 02:30 PM   HGBA1C 7.3 (H) 07/01/2020 12:10 PM   GFR 51.24 (L) 10/22/2020 02:30 PM   GFR 48.63 (L) 07/01/2020 12:10 PM    Last diabetic Eye exam:  Lab Results  Component Value Date/Time   HMDIABEYEEXA No Retinopathy 09/17/2020 12:00 AM    Last diabetic Foot exam:  No results found for: HMDIABFOOTEX   Lab Results  Component Value Date   CHOL 123 12/26/2019   HDL 33.80 (L) 12/26/2019   LDLCALC 55 12/26/2019   LDLDIRECT 78.4 11/02/2011   TRIG 170.0 (H) 12/26/2019   CHOLHDL 4 12/26/2019    Hepatic Function Latest Ref Rng & Units 10/22/2020 12/26/2019 08/11/2015  Total Protein 6.0 - 8.3 g/dL 7.1 6.7 7.3  Albumin 3.5 - 5.2 g/dL 4.5 4.3 4.3  AST 0 - 37 U/L _0 ALT 0 - 53 U/L _1 Alk Phosphatase 39 - 117 U/L 92 87 78  Total Bilirubin 0.2 - 1.2 mg/dL 0.5 0.7 0.6  Bilirubin, Direct 0.0 - 0.3 mg/dL - -  0.1    Lab Results  Component Value Date/Time   TSH 2.14 12/26/2019 10:23 AM   TSH 1.53 10/03/2012 08:48 AM    CBC Latest Ref Rng & Units 12/26/2019 06/27/2019 10/08/2015  WBC 4.0 - 10.5 K/uL 10.2 11.2(H) 10.2  Hemoglobin 13.0 - 17.0 g/dL 13.6 14.6 15.7  Hematocrit 39.0 - 52.0 % 39.9 43.7 45.3  Platelets 150.0 - 400.0 K/uL 229.0 229 211    No results found for: VD25OH  Clinical ASCVD: No  The ASCVD Risk score (Arnett DK, et al., 2019) failed to calculate for the following reasons:   The 2019 ASCVD risk score is only valid for ages 3 to 29    Depression screen PHQ 2/9  04/01/2020 04/01/2020 09/25/2018  Decreased Interest 0 0 0  Down, Depressed, Hopeless 0 0 0  PHQ - 2 Score 0 0 0  Altered sleeping 0 - -  Tired, decreased energy 0 - -  Change in appetite 0 - -  Feeling bad or failure about yourself  0 - -  Trouble concentrating 0 - -  Moving slowly or fidgety/restless 0 - -  Suicidal thoughts 0 - -  PHQ-9 Score 0 - -  Difficult doing work/chores Not difficult at all - -    Social History   Tobacco Use  Smoking Status Former   Years: 15.00   Types: Cigarettes   Quit date: 01/18/1971   Years since quitting: 49.8  Smokeless Tobacco Current   Types: Snuff   BP Readings from Last 3 Encounters:  10/22/20 132/72  07/01/20 (!) 148/70  04/01/20 (!) 148/72   Pulse Readings from Last 3 Encounters:  10/22/20 89  07/01/20 81  04/01/20 84   Wt Readings from Last 3 Encounters:  10/22/20 194 lb 3.2 oz (88.1 kg)  07/01/20 194 lb 3.2 oz (88.1 kg)  04/01/20 193 lb 6.4 oz (87.7 kg)   BMI Readings from Last 3 Encounters:  10/22/20 28.68 kg/m  07/01/20 28.68 kg/m  04/01/20 28.56 kg/m    Assessment/Interventions: Review of patient past medical history, allergies, medications, health status, including review of consultants reports, laboratory and other test data, was performed as part of comprehensive evaluation and provision of chronic care management services.   SDOH:  (Social Determinants of Health) assessments and interventions performed: Yes  SDOH Screenings   Alcohol Screen: Not on file  Depression (PHQ2-9): Low Risk    PHQ-2 Score: 0  Financial Resource Strain: Not on file  Food Insecurity: Not on file  Housing: Not on file  Physical Activity: Not on file  Social Connections: Not on file  Stress: Not on file  Tobacco Use: High Risk   Smoking Tobacco Use: Former   Smokeless Tobacco Use: Current   Passive Exposure: Not on file  Transportation Needs: Not on file    [place]  Allergies  Allergen Reactions   Coreg [Carvedilol]  Other (See Comments)    weakness   Lovastatin     Leg weakness   Morphine Sulfate Other (See Comments)    Per the pt" after taking morphine within 30 mins I was soaking wet, I did not like the way it made me feel"    Medications Reviewed Today     Reviewed by Cassandria Anger, MD (Physician) on 10/22/20 at [facility] Status: <None>   Medication Order Taking? Sig Documenting Provider Last Dose Status Informant  amLODipine (NORVASC) 5 MG tablet [id number] Yes TAKE 1 TABLET BY MOUTH  DAILY Plotnikov, Tyrone Apple  V, MD Taking Active   aspirin 81 MG tablet [id number] Yes Take 2 tablets (162 mg total) by mouth daily.  Patient taking differently: Take 81 mg by mouth in the morning and at bedtime.   Plotnikov, Evie Lacks, MD Taking Active   cholecalciferol (VITAMIN D) 25 MCG (1000 UT) tablet [id number] Yes Take 1 tablet (1,000 Units total) by mouth every morning. Follow-up due in April must see provider for refills  Patient taking differently: Take 1,000 Units by mouth daily. Follow-up due in April must see provider for refills   Plotnikov, Evie Lacks, MD Taking Active Self  metFORMIN (GLUCOPHAGE-XR) 500 MG 24 hr tablet [id number] Yes TAKE 1 TABLET BY MOUTH  TWICE DAILY Plotnikov, Evie Lacks, MD Taking Active   methocarbamol (ROBAXIN) 500 MG tablet [id number] Yes Take 1 tablet (500 mg total) by mouth every 6 (six) hours as needed for muscle spasms. Erline Levine, MD Taking Active   oxyCODONE (OXY IR/ROXICODONE) 5 MG immediate release tablet [id number] Yes Take 1 tablet (5 mg total) by mouth every 3 (three) hours as needed for moderate pain ((score 4 to 6)). Erline Levine, MD Taking Active   pravastatin (PRAVACHOL) 40 MG tablet [id number] Yes TAKE 1 TABLET BY MOUTH  DAILY Plotnikov, Evie Lacks, MD Taking Active   repaglinide (PRANDIN) 1 MG tablet [id number] Yes Take 1 tablet (1 mg total) by mouth 3 (three) times daily before meals. Plotnikov, Evie Lacks, MD Taking Active   telmisartan-hydrochlorothiazide  (MICARDIS HCT) 80-25 MG tablet [id number] Yes TAKE 1 TABLET BY MOUTH  DAILY Plotnikov, Evie Lacks, MD Taking Active             Patient Active Problem List   Diagnosis Date Noted   Lumbar foraminal stenosis 07/03/2019   Preop exam for internal medicine 06/25/2019   Ataxia 05/14/2019   Hip pain, bilateral 01/16/2018   Trochanteric bursitis 06/17/2017   Double vision 03/09/2016   Gait disorder 12/08/2015   Dysuria 10/28/2015   Bladder cancer ([place]) 08/13/2015   Sialoadenitis 01/01/2015   RBBB 07/12/2014   Pyogenic granuloma 02/06/2013   Well adult exam 11/11/2011   DM (diabetes mellitus), type 2 with renal complications ([place]) 01/19/3233   Hypogonadism male 03/10/2011   Fatigue 11/10/2010   Hyperkalemia 07/06/2010   Neoplasm of uncertain behavior of skin 10/14/2009   UNS ADVRS EFF UNS RX MEDICINAL&BIOLOGICAL SBSTNC 04/25/2008   SEBORRHEIC DERMATITIS 12/28/2007   Actinic keratosis 12/28/2007   Disorder resulting from impaired renal function 09/30/2006   LOW BACK PAIN 09/30/2006   Dyslipidemia 07/26/2006   Essential hypertension 07/26/2006   Coronary atherosclerosis 07/26/2006   OSTEOARTHRITIS 07/26/2006    Immunization History  Administered Date(s) Administered   Fluad Quad(high Dose 65+) 09/25/2018, 09/25/2019, 10/22/2020   Influenza Split 11/11/2011   Influenza Whole 11/05/2005   Influenza, High Dose Seasonal PF 12/08/2015, 09/16/2016, 09/19/2017   Influenza,inj,Quad PF,6+ Mos 10/05/2012, 09/30/2014   Moderna Sars-Covid-2 Vaccination 02/15/2019, 03/15/2019   PFIZER(Purple Top)SARS-COV-2 Vaccination 12/12/2019   Pneumococcal Conjugate-13 04/04/2013   Pneumococcal Polysaccharide-23 07/02/2005, 12/16/2016   Td 11/11/2011    Conditions to be addressed/monitored:  {USCCMDZASSESSMENTOPTIONS:23563}  There are no care plans that you recently modified to display for this patient.     Medication Assistance: {MEDASSISTANCEINFO:25044}  Compliance/Adherence/Medication fill  history: Care Gaps: ***  Patient's preferred pharmacy is:  [facility], [address] [place] [address] Phone: [phone] Fax: [phone]  [id number] - [place], [place]  [address] [place] [address] Phone: [phone] Fax: [phone]  [facility]  ([place]) - [place], [place] [facility] [address] [place] [address] [place] [address] Phone: [phone] Fax: [phone]   Uses pill box? {Yes or If no, why not?:20788} Pt endorses ***% compliance  Care Plan and Follow Up Patient Decision:  {FOLLOWUP:24991}  Plan: {CM FOLLOW UP HTDS:28768}  ***  Current Barriers:  {pharmacybarriers:24917}  Pharmacist Clinical Goal(s):  Patient will {PHARMACYGOALCHOICES:24921} through collaboration with PharmD and provider.   Interventions: 1:1 collaboration with Plotnikov, Evie Lacks, MD regarding development and update of comprehensive plan of care as evidenced by provider attestation and co-signature Inter-disciplinary care team collaboration (see longitudinal plan of care) Comprehensive medication review performed; medication list updated in electronic medical record  Hypertension (BP goal <140/90) -{US controlled/uncontrolled:25276} -Current treatment: Amlodipine 82m - 1 tablet daily  Telmisartan-HCTZ -80-253m- 1 tablet daily  -Medications previously tried: losartan, olmesartan, valsartan, carvedilol   -Current home readings: *** -Current dietary habits: *** -Current exercise habits: *** -{ACTIONS;DENIES/REPORTS:21021675::"Denies"} hypotensive/hypertensive symptoms -Educated on {CCM BP Counseling:25124} -Counseled to monitor BP at home ***, document, and provide log at future appointments -{CCMPHARMDINTERVENTION:25122}  Hyperlipidemia: (LDL goal < 70) -Controlled Lab Results  Component Value Date   LDLCALC 55  12/26/2019  -Current treatment: Pravastatin 4075m 1 tablet daily  ASA 17m83m1 tablet daily  -Medications previously tried: lovastastin, pitavastatin,   -Current dietary patterns: *** -Current exercise habits: *** -Educated on {CCM HLD Counseling:25126} -{CCMPHARMDINTERVENTION:25122}  Diabetes (A1c goal <7%) -{US controlled/uncontrolled:25276} Lab Results  Component Value Date   HGBA1C 6.9 (H) 10/22/2020  -Current medications: Metformin XR 500mg57m tablet twice daily  Repaglinide 1mg -55mtablet 3 times daily with meals  -Medications previously tried: invokana, kombiglyze  -Current home glucose readings fasting glucose: *** post prandial glucose: *** -{ACTIONS;DENIES/REPORTS:21021675::"Denies"} hypoglycemic/hyperglycemic symptoms -Current meal patterns:  breakfast: ***  lunch: ***  dinner: *** snacks: *** drinks: *** -Current exercise: *** -Educated on {CCM DM COUNSELING:25123} -Counseled to check feet daily and get yearly eye exams -{CCMPHARMDINTERVENTION:25122}  Chronic Pain (Goal: Pain control) -{US controlled/uncontrolled:25276} -Current treatment  Celecoxib 100mg -72mapsule daily as needed (no more than 2 times weekly) Oxycodone 5mg - 120mblet every 3 hours as needed  Methocarbamol 500mg - 158mlet every 6 hours as needed  -Medications previously tried: norco, naproxen, gabapentin  -{CCMPHARMDINTERVENTION:25122}  Patient Goals/Self-Care Activities Patient will:  - {pharmacypatientgoals:24919}  Follow Up Plan: {CM FOLLOW UP PLAN:22241}  Chart prep = 12 minutes

## 2020-11-23 ENCOUNTER — Other Ambulatory Visit: Payer: Self-pay | Admitting: Internal Medicine

## 2021-01-22 ENCOUNTER — Other Ambulatory Visit: Payer: Self-pay

## 2021-01-22 ENCOUNTER — Encounter: Payer: Self-pay | Admitting: Internal Medicine

## 2021-01-22 ENCOUNTER — Ambulatory Visit (INDEPENDENT_AMBULATORY_CARE_PROVIDER_SITE_OTHER): Payer: Medicare Other | Admitting: Internal Medicine

## 2021-01-22 VITALS — BP 138/68 | HR 86 | Temp 98.2°F | Ht 69.0 in | Wt 196.6 lb

## 2021-01-22 DIAGNOSIS — E1122 Type 2 diabetes mellitus with diabetic chronic kidney disease: Secondary | ICD-10-CM

## 2021-01-22 DIAGNOSIS — M5440 Lumbago with sciatica, unspecified side: Secondary | ICD-10-CM

## 2021-01-22 DIAGNOSIS — N320 Bladder-neck obstruction: Secondary | ICD-10-CM | POA: Diagnosis not present

## 2021-01-22 DIAGNOSIS — R270 Ataxia, unspecified: Secondary | ICD-10-CM

## 2021-01-22 DIAGNOSIS — E785 Hyperlipidemia, unspecified: Secondary | ICD-10-CM | POA: Diagnosis not present

## 2021-01-22 DIAGNOSIS — G8929 Other chronic pain: Secondary | ICD-10-CM

## 2021-01-22 DIAGNOSIS — R5383 Other fatigue: Secondary | ICD-10-CM | POA: Diagnosis not present

## 2021-01-22 DIAGNOSIS — N1831 Chronic kidney disease, stage 3a: Secondary | ICD-10-CM | POA: Diagnosis not present

## 2021-01-22 DIAGNOSIS — I10 Essential (primary) hypertension: Secondary | ICD-10-CM | POA: Diagnosis not present

## 2021-01-22 DIAGNOSIS — Z0000 Encounter for general adult medical examination without abnormal findings: Secondary | ICD-10-CM

## 2021-01-22 LAB — LIPID PANEL
Cholesterol: 123 mg/dL (ref 0–200)
HDL: 34.6 mg/dL — ABNORMAL LOW (ref >39.00)
LDL Cholesterol: 64 mg/dL (ref 0–99)
NonHDL: 88.05
Total CHOL/HDL Ratio: 4
Triglycerides: 119 mg/dL (ref 0.0–149.0)
VLDL: 23.8 mg/dL (ref 0.0–40.0)

## 2021-01-22 LAB — CBC WITH DIFFERENTIAL/PLATELET
Basophils Absolute: 0.1 10*3/uL (ref 0.0–0.1)
Basophils Relative: 0.6 % (ref 0.0–3.0)
Eosinophils Absolute: 0.1 10*3/uL (ref 0.0–0.7)
Eosinophils Relative: 0.6 % (ref 0.0–5.0)
HCT: 43.2 % (ref 39.0–52.0)
Hemoglobin: 14.5 g/dL (ref 13.0–17.0)
Lymphocytes Relative: 14.1 % (ref 12.0–46.0)
Lymphs Abs: 1.5 10*3/uL (ref 0.7–4.0)
MCHC: 33.7 g/dL (ref 30.0–36.0)
MCV: 88.5 fl (ref 78.0–100.0)
Monocytes Absolute: 0.9 10*3/uL (ref 0.1–1.0)
Monocytes Relative: 8.4 % (ref 3.0–12.0)
Neutro Abs: 8.3 10*3/uL — ABNORMAL HIGH (ref 1.4–7.7)
Neutrophils Relative %: 76.3 % (ref 43.0–77.0)
Platelets: 204 10*3/uL (ref 150.0–400.0)
RBC: 4.88 Mil/uL (ref 4.22–5.81)
RDW: 13.2 % (ref 11.5–15.5)
WBC: 10.8 10*3/uL — ABNORMAL HIGH (ref 4.0–10.5)

## 2021-01-22 LAB — URINALYSIS
Bilirubin Urine: NEGATIVE
Hgb urine dipstick: NEGATIVE
Ketones, ur: NEGATIVE
Leukocytes,Ua: NEGATIVE
Nitrite: NEGATIVE
Specific Gravity, Urine: <=1.005 — AB (ref 1.000–1.030)
Total Protein, Urine: NEGATIVE
Urine Glucose: NEGATIVE
Urobilinogen, UA: 0.2 (ref 0.0–1.0)
pH: 6 (ref 5.0–8.0)

## 2021-01-22 LAB — COMPREHENSIVE METABOLIC PANEL
ALT: 16 U/L (ref 0–53)
AST: 15 U/L (ref 0–37)
Albumin: 4.5 g/dL (ref 3.5–5.2)
Alkaline Phosphatase: 69 U/L (ref 39–117)
BUN: 25 mg/dL — ABNORMAL HIGH (ref 6–23)
CO2: 29 mEq/L (ref 19–32)
Calcium: 10.1 mg/dL (ref 8.4–10.5)
Chloride: 102 mEq/L (ref 96–112)
Creatinine, Ser: 1.37 mg/dL (ref 0.40–1.50)
GFR: 47.59 mL/min — ABNORMAL LOW (ref >60.00)
Glucose, Bld: 162 mg/dL — ABNORMAL HIGH (ref 70–99)
Potassium: 4.6 mEq/L (ref 3.5–5.1)
Sodium: 141 mEq/L (ref 135–145)
Total Bilirubin: 0.7 mg/dL (ref 0.2–1.2)
Total Protein: 6.7 g/dL (ref 6.0–8.3)

## 2021-01-22 LAB — HEMOGLOBIN A1C: Hgb A1c MFr Bld: 7.2 % — ABNORMAL HIGH (ref 4.6–6.5)

## 2021-01-22 LAB — TSH: TSH: 1.6 u[IU]/mL (ref 0.35–5.50)

## 2021-01-22 LAB — PSA: PSA: 2.67 ng/mL (ref 0.10–4.00)

## 2021-01-22 NOTE — Assessment & Plan Note (Signed)
Metformin 500 mg bid

## 2021-01-22 NOTE — Progress Notes (Signed)
Subjective:  Patient ID: Jeff Burgess, male    DOB: 08-21-1935  Age: 86 y.o. MRN: [id number]  CC: Follow-up (3 month f/u)   HPI Jeff Burgess presents for poor balance, stiffness, neuropathy and spinal stenosis. F/u on DM, HTN - stable Well exam  Outpatient Medications Prior to Visit  Medication Sig Dispense Refill   amLODipine (NORVASC) 5 MG tablet TAKE 1 TABLET BY MOUTH  DAILY 90 tablet 3   aspirin 81 MG tablet Take 2 tablets (162 mg total) by mouth daily. (Patient taking differently: Take 81 mg by mouth in the morning and at bedtime.) 100 tablet 3   celecoxib (CELEBREX) 100 MG capsule Take 1 capsule (100 mg total) by mouth daily as needed for moderate pain (Take no more than twice per week). 30 capsule 1   cholecalciferol (VITAMIN D) 25 MCG (1000 UT) tablet Take 1 tablet (1,000 Units total) by mouth every morning. Follow-up due in April must see provider for refills (Patient taking differently: Take 1,000 Units by mouth daily. Follow-up due in April must see provider for refills) 90 tablet 0   metFORMIN (GLUCOPHAGE-XR) 500 MG 24 hr tablet TAKE 1 TABLET BY MOUTH  TWICE DAILY 180 tablet 3   methocarbamol (ROBAXIN) 500 MG tablet Take 1 tablet (500 mg total) by mouth every 6 (six) hours as needed for muscle spasms. 60 tablet 1   oxyCODONE (OXY IR/ROXICODONE) 5 MG immediate release tablet Take 1 tablet (5 mg total) by mouth every 3 (three) hours as needed for moderate pain ((score 4 to 6)). 30 tablet 0   pravastatin (PRAVACHOL) 40 MG tablet TAKE 1 TABLET BY MOUTH  DAILY 90 tablet 3   repaglinide (PRANDIN) 1 MG tablet Take 1 tablet (1 mg total) by mouth 3 (three) times daily before meals. 90 tablet 11   telmisartan-hydrochlorothiazide (MICARDIS HCT) 80-25 MG tablet TAKE 1 TABLET BY MOUTH  DAILY 90 tablet 3   No facility-administered medications prior to visit.    ROS: Review of Systems  Constitutional:  Negative for appetite change, fatigue and unexpected weight change.  HENT:   Negative for congestion, nosebleeds, sneezing, sore throat and trouble swallowing.   Eyes:  Negative for itching and visual disturbance.  Respiratory:  Negative for cough.   Cardiovascular:  Negative for chest pain, palpitations and leg swelling.  Gastrointestinal:  Negative for abdominal distention, blood in stool, diarrhea and nausea.  Genitourinary:  Negative for frequency and hematuria.  Musculoskeletal:  Positive for arthralgias and gait problem. Negative for back pain, joint swelling and neck pain.  Skin:  Negative for color change and rash.  Neurological:  Negative for dizziness, tremors, speech difficulty and weakness.  Psychiatric/Behavioral:  Negative for agitation, dysphoric mood and sleep disturbance. The patient is not nervous/anxious.    Objective:  BP 138/68 (BP Location: Left Arm)    Pulse 86    Temp 98.2 F (36.8 C) (Oral)    Ht 5\' 9"  (1.753 m)    Wt 196 lb 9.6 oz (89.2 kg)    SpO2 97%    BMI 29.03 kg/m   BP Readings from Last 3 Encounters:  01/22/21 138/68  10/22/20 132/72  07/01/20 (!) 148/70    Wt Readings from Last 3 Encounters:  01/22/21 196 lb 9.6 oz (89.2 kg)  10/22/20 194 lb 3.2 oz (88.1 kg)  07/01/20 194 lb 3.2 oz (88.1 kg)    Physical Exam Constitutional:      General: He is not in acute distress.  Appearance: He is well-developed.     Comments: NAD  Eyes:     Conjunctiva/sclera: Conjunctivae normal.     Pupils: Pupils are equal, round, and reactive to light.  Neck:     Thyroid: No thyromegaly.     Vascular: No JVD.  Cardiovascular:     Rate and Rhythm: Normal rate and regular rhythm.     Heart sounds: Normal heart sounds. No murmur heard.   No friction rub. No gallop.  Pulmonary:     Effort: Pulmonary effort is normal. No respiratory distress.     Breath sounds: Normal breath sounds. No wheezing or rales.  Chest:     Chest wall: No tenderness.  Abdominal:     General: Bowel sounds are normal. There is no distension.     Palpations:  Abdomen is soft. There is no mass.     Tenderness: There is no abdominal tenderness. There is no guarding or rebound.  Musculoskeletal:        General: Tenderness present. Normal range of motion.     Cervical back: Normal range of motion.  Lymphadenopathy:     Cervical: No cervical adenopathy.  Skin:    General: Skin is warm and dry.     Findings: No rash.  Neurological:     Mental Status: He is alert and oriented to person, place, and time.     Cranial Nerves: No cranial nerve deficit.     Motor: Weakness present. No abnormal muscle tone.     Coordination: Coordination abnormal.     Gait: Gait abnormal.     Deep Tendon Reflexes: Reflexes are normal and symmetric.  Psychiatric:        Behavior: Behavior normal.        Thought Content: Thought content normal.        Judgment: Judgment normal.  Weak legs, using a cane; ataxic  Lab Results  Component Value Date   WBC 10.2 12/26/2019   HGB 13.6 12/26/2019   HCT 39.9 12/26/2019   PLT 229.0 12/26/2019   GLUCOSE 149 (H) 10/22/2020   CHOL 123 12/26/2019   TRIG 170.0 (H) 12/26/2019   HDL 33.80 (L) 12/26/2019   LDLDIRECT 78.4 11/02/2011   LDLCALC 55 12/26/2019   ALT 22 10/22/2020   AST 19 10/22/2020   NA 140 10/22/2020   K 4.6 10/22/2020   CL 102 10/22/2020   CREATININE 1.29 10/22/2020   BUN 25 (H) 10/22/2020   CO2 30 10/22/2020   TSH 2.14 12/26/2019   PSA 2.34 12/26/2019   INR 1.02 10/08/2015   HGBA1C 6.9 (H) 10/22/2020    VAS [place] LOWER EXTREMITY VENOUS (DVT)  Result Date: 08/01/2019  Lower Venous DVT Study Indications: Edema, Pain, and Swelling. Other Indications: Patient reports that lower extremity edema has resolved. Risk Factors: Surgery Recent back surgery. Performing Technologist: Delorise Shiner RVT  Examination Guidelines: A complete evaluation includes B-mode imaging, spectral Doppler, color Doppler, and power Doppler as needed of all accessible portions of each vessel. Bilateral testing is considered an integral  part of a complete examination. Limited examinations for reoccurring indications may be performed as noted. The reflux portion of the exam is performed with the patient in reverse Trendelenburg.  +---------+---------------+---------+-----------+----------+--------------+  RIGHT     Compressibility Phasicity Spontaneity Properties Thrombus Aging  +---------+---------------+---------+-----------+----------+--------------+  CFV       Full            Yes       Yes                                    +---------+---------------+---------+-----------+----------+--------------+  SFJ       Full            Yes       Yes                                    +---------+---------------+---------+-----------+----------+--------------+  FV Prox   Full            Yes       Yes                                    +---------+---------------+---------+-----------+----------+--------------+  FV Mid    Full            Yes       Yes                                    +---------+---------------+---------+-----------+----------+--------------+  FV Distal Full            Yes       Yes                                    +---------+---------------+---------+-----------+----------+--------------+  PFV       Full                      Yes                                    +---------+---------------+---------+-----------+----------+--------------+  POP       Full            Yes       Yes                                    +---------+---------------+---------+-----------+----------+--------------+  PTV       Full                                                             +---------+---------------+---------+-----------+----------+--------------+  PERO      Full                                                             +---------+---------------+---------+-----------+----------+--------------+  GSV       Full                      Yes                                    +---------+---------------+---------+-----------+----------+--------------+  SSV        Full                                                             +---------+---------------+---------+-----------+----------+--------------+   +---------+---------------+---------+-----------+----------+--------------+  LEFT      Compressibility Phasicity Spontaneity Properties Thrombus Aging  +---------+---------------+---------+-----------+----------+--------------+  CFV       Full            Yes       Yes                                    +---------+---------------+---------+-----------+----------+--------------+  SFJ       Full            Yes       Yes                                    +---------+---------------+---------+-----------+----------+--------------+  FV Prox   Full            Yes       Yes                                    +---------+---------------+---------+-----------+----------+--------------+  FV Mid    Full            Yes       Yes                                    +---------+---------------+---------+-----------+----------+--------------+  FV Distal Full            Yes       Yes                                    +---------+---------------+---------+-----------+----------+--------------+  PFV       Full                      Yes                                    +---------+---------------+---------+-----------+----------+--------------+  POP       Full            Yes       Yes                                    +---------+---------------+---------+-----------+----------+--------------+  PTV       Full                      Yes                                    +---------+---------------+---------+-----------+----------+--------------+  PERO      Full                      Yes                                    +---------+---------------+---------+-----------+----------+--------------+  GSV       Full  Yes                                    +---------+---------------+---------+-----------+----------+--------------+  SSV       Full                                                              +---------+---------------+---------+-----------+----------+--------------+     Summary: BILATERAL: - No evidence of deep vein thrombosis seen in the lower extremities, bilaterally. - No evidence of superficial venous thrombosis in the lower extremities, bilaterally. -No evidence of popliteal cyst, bilaterally.   *See table(s) above for measurements and observations. Electronically signed by Deitra Mayo MD on 08/01/2019 at 12:45:24 PM.    Final     Assessment & Plan:   Problem List Items Addressed This Visit     Ataxia    Due to spinal stenosis  Pt has enrolled in the [facility] program for $7000 - he was promised to get better by 55%. He is not better...  C/o LE weakness, gait disorder. No pain, no LBP      DM (diabetes mellitus), type 2 with renal complications (HCC)    Metformin 500 mg bid      Relevant Orders   Hemoglobin A1c   Dyslipidemia    Cont on Pravachol      Essential hypertension   Fatigue - Primary   LOW BACK PAIN    Cont to walk Start silver sneakers, gym      Well adult exam     We discussed age appropriate health related issues, including available/recomended screening tests and vaccinations. Labs were ordered to be later reviewed . All questions were answered. We discussed one or more of the following - seat belt use, use of sunscreen/sun exposure exercise, fall risk reduction, second hand smoke exposure, firearm use and storage, seat belt use, a need for adhering to healthy diet and exercise. Labs were ordered.  All questions were answered. Due tDAP       Relevant Orders   TSH   Urinalysis   CBC with Differential/Platelet   Lipid panel   PSA   Comprehensive metabolic panel   Hemoglobin A1c   Other Visit Diagnoses     Bladder neck obstruction       Relevant Orders   Urinalysis   PSA         No orders of the defined types were placed in this encounter.     Follow-up: Return in about 3 months (around  04/22/2021) for a follow-up visit.  Walker Kehr, MD

## 2021-01-22 NOTE — Assessment & Plan Note (Signed)
Cont to walk Start silver sneakers, gym

## 2021-01-22 NOTE — Assessment & Plan Note (Signed)
Due to spinal stenosis  Pt has enrolled in the [facility] program for $7000 - he was promised to get better by 55%. He is not better...  C/o LE weakness, gait disorder. No pain, no LBP

## 2021-01-22 NOTE — Assessment & Plan Note (Addendum)
°  We discussed age appropriate health related issues, including available/recomended screening tests and vaccinations. Labs were ordered to be later reviewed . All questions were answered. We discussed one or more of the following - seat belt use, use of sunscreen/sun exposure exercise, fall risk reduction, second hand smoke exposure, firearm use and storage, seat belt use, a need for adhering to healthy diet and exercise. Labs were ordered.  All questions were answered. Due tDAP

## 2021-01-22 NOTE — Patient Instructions (Signed)
tDAP

## 2021-01-22 NOTE — Assessment & Plan Note (Signed)
Cont on Pravachol

## 2021-02-28 ENCOUNTER — Other Ambulatory Visit: Payer: Self-pay | Admitting: Internal Medicine

## 2021-04-16 ENCOUNTER — Telehealth: Payer: Self-pay | Admitting: Internal Medicine

## 2021-04-16 NOTE — Telephone Encounter (Signed)
Left message for patient to call back to schedule Medicare Annual Wellness Visit  ? ?Last AWV  09/19/17 ? ?Please schedule at anytime with [facility] if patient calls the office back.   ?Patient is scheduled to come in on 04/23/21 to see Dr. Alain Marion, if [facility] has opening please schedule AWV for that day as well. ? ?Any questions, please call me at [phone]  ?

## 2021-04-22 DIAGNOSIS — D225 Melanocytic nevi of trunk: Secondary | ICD-10-CM | POA: Diagnosis not present

## 2021-04-22 DIAGNOSIS — L814 Other melanin hyperpigmentation: Secondary | ICD-10-CM | POA: Diagnosis not present

## 2021-04-22 DIAGNOSIS — L821 Other seborrheic keratosis: Secondary | ICD-10-CM | POA: Diagnosis not present

## 2021-04-22 DIAGNOSIS — L578 Other skin changes due to chronic exposure to nonionizing radiation: Secondary | ICD-10-CM | POA: Diagnosis not present

## 2021-04-22 DIAGNOSIS — L570 Actinic keratosis: Secondary | ICD-10-CM | POA: Diagnosis not present

## 2021-04-23 ENCOUNTER — Encounter: Payer: Self-pay | Admitting: Internal Medicine

## 2021-04-23 ENCOUNTER — Ambulatory Visit (INDEPENDENT_AMBULATORY_CARE_PROVIDER_SITE_OTHER): Payer: Medicare Other | Admitting: Internal Medicine

## 2021-04-23 VITALS — BP 130/70 | HR 58 | Temp 97.7°F | Ht 69.0 in | Wt 188.0 lb

## 2021-04-23 DIAGNOSIS — E1122 Type 2 diabetes mellitus with diabetic chronic kidney disease: Secondary | ICD-10-CM

## 2021-04-23 DIAGNOSIS — I10 Essential (primary) hypertension: Secondary | ICD-10-CM | POA: Diagnosis not present

## 2021-04-23 DIAGNOSIS — M1990 Unspecified osteoarthritis, unspecified site: Secondary | ICD-10-CM

## 2021-04-23 DIAGNOSIS — R269 Unspecified abnormalities of gait and mobility: Secondary | ICD-10-CM

## 2021-04-23 DIAGNOSIS — M48061 Spinal stenosis, lumbar region without neurogenic claudication: Secondary | ICD-10-CM

## 2021-04-23 DIAGNOSIS — N1831 Chronic kidney disease, stage 3a: Secondary | ICD-10-CM

## 2021-04-23 LAB — COMPREHENSIVE METABOLIC PANEL
ALT: 18 U/L (ref 0–53)
AST: 17 U/L (ref 0–37)
Albumin: 4.2 g/dL (ref 3.5–5.2)
Alkaline Phosphatase: 65 U/L (ref 39–117)
BUN: 22 mg/dL (ref 6–23)
CO2: 28 mEq/L (ref 19–32)
Calcium: 9.6 mg/dL (ref 8.4–10.5)
Chloride: 102 mEq/L (ref 96–112)
Creatinine, Ser: 1.2 mg/dL (ref 0.40–1.50)
GFR: 55.69 mL/min — ABNORMAL LOW (ref >60.00)
Glucose, Bld: 148 mg/dL — ABNORMAL HIGH (ref 70–99)
Potassium: 3.8 mEq/L (ref 3.5–5.1)
Sodium: 137 mEq/L (ref 135–145)
Total Bilirubin: 0.7 mg/dL (ref 0.2–1.2)
Total Protein: 6.5 g/dL (ref 6.0–8.3)

## 2021-04-23 LAB — HEMOGLOBIN A1C: Hgb A1c MFr Bld: 7.2 % — ABNORMAL HIGH (ref 4.6–6.5)

## 2021-04-23 NOTE — Assessment & Plan Note (Signed)
Not better ?Will try Zynex TENS/NMES ?

## 2021-04-23 NOTE — Assessment & Plan Note (Signed)
Cont on Metformin 500 mg bid ?

## 2021-04-23 NOTE — Assessment & Plan Note (Signed)
Amlodipine  5 mg/d ? ?

## 2021-04-23 NOTE — Progress Notes (Signed)
? ?Subjective:  ?Patient ID: Jeff Burgess, male    DOB: [birth year]  Age: 86 y.o. MRN: [id number] ? ?CC: No chief complaint on file. ? ? ?HPI ?Garner Gavel presents for leg weakness ?F/u DM, HTN, neuropathy ? ?Outpatient Medications Prior to Visit  ?Medication Sig Dispense Refill  ? amLODipine (NORVASC) 5 MG tablet TAKE 1 TABLET BY MOUTH  DAILY 90 tablet 3  ? aspirin 81 MG tablet Take 2 tablets (162 mg total) by mouth daily. (Patient taking differently: Take 81 mg by mouth in the morning and at bedtime.) 100 tablet 3  ? cholecalciferol (VITAMIN D) 25 MCG (1000 UT) tablet Take 1 tablet (1,000 Units total) by mouth every morning. Follow-up due in April must see provider for refills (Patient taking differently: Take 1,000 Units by mouth daily. Follow-up due in April must see provider for refills) 90 tablet 0  ? metFORMIN (GLUCOPHAGE-XR) 500 MG 24 hr tablet TAKE 1 TABLET BY MOUTH  TWICE DAILY 180 tablet 3  ? methocarbamol (ROBAXIN) 500 MG tablet Take 1 tablet (500 mg total) by mouth every 6 (six) hours as needed for muscle spasms. 60 tablet 1  ? oxyCODONE (OXY IR/ROXICODONE) 5 MG immediate release tablet Take 1 tablet (5 mg total) by mouth every 3 (three) hours as needed for moderate pain ((score 4 to 6)). 30 tablet 0  ? pravastatin (PRAVACHOL) 40 MG tablet TAKE 1 TABLET BY MOUTH  DAILY 90 tablet 3  ? repaglinide (PRANDIN) 1 MG tablet Take 1 tablet (1 mg total) by mouth 3 (three) times daily before meals. 90 tablet 11  ? telmisartan-hydrochlorothiazide (MICARDIS HCT) 80-25 MG tablet TAKE 1 TABLET BY MOUTH  DAILY 90 tablet 3  ? celecoxib (CELEBREX) 100 MG capsule Take 1 capsule (100 mg total) by mouth daily as needed for moderate pain (Take no more than twice per week). 30 capsule 1  ? ?No facility-administered medications prior to visit.  ? ? ?ROS: ?Review of Systems  ?Constitutional:  Positive for fatigue. Negative for appetite change and unexpected weight change.  ?HENT:  Negative for congestion, nosebleeds,  sneezing, sore throat and trouble swallowing.   ?Eyes:  Negative for itching and visual disturbance.  ?Respiratory:  Negative for cough.   ?Cardiovascular:  Negative for chest pain, palpitations and leg swelling.  ?Gastrointestinal:  Negative for abdominal distention, blood in stool, diarrhea and nausea.  ?Genitourinary:  Negative for frequency and hematuria.  ?Musculoskeletal:  Positive for arthralgias and gait problem. Negative for back pain, joint swelling and neck pain.  ?Skin:  Negative for rash.  ?Neurological:  Positive for weakness and numbness. Negative for dizziness, tremors and speech difficulty.  ?Psychiatric/Behavioral:  Negative for agitation, dysphoric mood, sleep disturbance and suicidal ideas. The patient is not nervous/anxious.   ? ?Objective:  ?BP 130/70 (BP Location: Left Arm, Patient Position: Sitting, Cuff Size: Large)   Pulse (!) 58   Temp 97.7 ?F (36.5 ?C) (Oral)   Ht '5\' 9"'$  (1.753 m)   Wt 188 lb (85.3 kg)   SpO2 95%   BMI 27.76 kg/m?  ? ?BP Readings from Last 3 Encounters:  ?04/23/21 130/70  ?01/22/21 138/68  ?10/22/20 132/72  ? ? ?Wt Readings from Last 3 Encounters:  ?04/23/21 188 lb (85.3 kg)  ?01/22/21 196 lb 9.6 oz (89.2 kg)  ?10/22/20 194 lb 3.2 oz (88.1 kg)  ? ? ?Physical Exam ?Constitutional:   ?   General: He is not in acute distress. ?   Appearance: He is well-developed.  ?  Comments: NAD  ?Eyes:  ?   Conjunctiva/sclera: Conjunctivae normal.  ?   Pupils: Pupils are equal, round, and reactive to light.  ?Neck:  ?   Thyroid: No thyromegaly.  ?   Vascular: No JVD.  ?Cardiovascular:  ?   Rate and Rhythm: Normal rate and regular rhythm.  ?   Heart sounds: Normal heart sounds. No murmur heard. ?  No friction rub. No gallop.  ?Pulmonary:  ?   Effort: Pulmonary effort is normal. No respiratory distress.  ?   Breath sounds: Normal breath sounds. No wheezing or rales.  ?Chest:  ?   Chest wall: No tenderness.  ?Abdominal:  ?   General: Bowel sounds are normal. There is no distension.  ?    Palpations: Abdomen is soft. There is no mass.  ?   Tenderness: There is no abdominal tenderness. There is no guarding or rebound.  ?Musculoskeletal:     ?   General: No tenderness. Normal range of motion.  ?   Cervical back: Normal range of motion.  ?Lymphadenopathy:  ?   Cervical: No cervical adenopathy.  ?Skin: ?   General: Skin is warm and dry.  ?   Findings: No rash.  ?Neurological:  ?   Mental Status: He is alert and oriented to person, place, and time.  ?   Cranial Nerves: No cranial nerve deficit.  ?   Motor: Weakness present. No abnormal muscle tone.  ?   Coordination: Coordination normal.  ?   Gait: Gait abnormal.  ?   Deep Tendon Reflexes: Reflexes are normal and symmetric.  ?Psychiatric:     ?   Behavior: Behavior normal.     ?   Thought Content: Thought content normal.     ?   Judgment: Judgment normal.  ? ?Wide base slow gait ?Cane ? ? ?Lab Results  ?Component Value Date  ? WBC 10.8 (H) 01/22/2021  ? HGB 14.5 01/22/2021  ? HCT 43.2 01/22/2021  ? PLT 204.0 01/22/2021  ? GLUCOSE 162 (H) 01/22/2021  ? CHOL 123 01/22/2021  ? TRIG 119.0 01/22/2021  ? HDL 34.60 (L) 01/22/2021  ? LDLDIRECT 78.4 11/02/2011  ? [place] 64 01/22/2021  ? ALT 16 01/22/2021  ? AST 15 01/22/2021  ? NA 141 01/22/2021  ? K 4.6 01/22/2021  ? CL 102 01/22/2021  ? CREATININE 1.37 01/22/2021  ? BUN 25 (H) 01/22/2021  ? CO2 29 01/22/2021  ? TSH 1.60 01/22/2021  ? PSA 2.67 01/22/2021  ? INR 1.02 10/08/2015  ? HGBA1C 7.2 (H) 01/22/2021  ? ? ?VAS [place] LOWER EXTREMITY VENOUS (DVT) ? ?Result Date: 08/01/2019 ? Lower Venous DVT Study Indications: Edema, Pain, and Swelling. Other Indications: Patient reports that lower extremity edema has resolved. Risk Factors: Surgery Recent back surgery. Performing Technologist: Delorise Shiner RVT  Examination Guidelines: A complete evaluation includes B-mode imaging, spectral Doppler, color Doppler, and power Doppler as needed of all accessible portions of each vessel. Bilateral testing is considered an integral  part of a complete examination. Limited examinations for reoccurring indications may be performed as noted. The reflux portion of the exam is performed with the patient in reverse Trendelenburg.  +---------+---------------+---------+-----------+----------+--------------+ RIGHT    CompressibilityPhasicitySpontaneityPropertiesThrombus Aging +---------+---------------+---------+-----------+----------+--------------+ CFV      Full           Yes      Yes                                 +---------+---------------+---------+-----------+----------+--------------+  SFJ      Full           Yes      Yes                                 +---------+---------------+---------+-----------+----------+--------------+ FV Prox  Full           Yes      Yes                                 +---------+---------------+---------+-----------+----------+--------------+ FV Mid   Full           Yes      Yes                                 +---------+---------------+---------+-----------+----------+--------------+ FV DistalFull           Yes      Yes                                 +---------+---------------+---------+-----------+----------+--------------+ PFV      Full                    Yes                                 +---------+---------------+---------+-----------+----------+--------------+ POP      Full           Yes      Yes                                 +---------+---------------+---------+-----------+----------+--------------+ PTV      Full                                                        +---------+---------------+---------+-----------+----------+--------------+ PERO     Full                                                        +---------+---------------+---------+-----------+----------+--------------+ GSV      Full                    Yes                                 +---------+---------------+---------+-----------+----------+--------------+ SSV       Full                                                        +---------+---------------+---------+-----------+----------+--------------+   +---------+---------------+---------+-----------+----------+--------------+ LEFT     CompressibilityPhasicitySpontaneityPropertiesThrombus Agin

## 2021-04-23 NOTE — Assessment & Plan Note (Signed)
Rare Celebrex ?Blue-Emu cream was recommended to use 2-3 times a day ? ?

## 2021-04-23 NOTE — Patient Instructions (Signed)
Rare Celebrex is ok ? ?Blue-Emu cream use 2-3 times a day ?

## 2021-05-18 ENCOUNTER — Ambulatory Visit (INDEPENDENT_AMBULATORY_CARE_PROVIDER_SITE_OTHER): Payer: Medicare Other

## 2021-05-18 DIAGNOSIS — Z0000 Encounter for general adult medical examination without abnormal findings: Secondary | ICD-10-CM

## 2021-05-18 NOTE — Patient Instructions (Addendum)
Jeff Burgess , ?Thank you for taking time to come for your Medicare Wellness Visit. I appreciate your ongoing commitment to your health goals. Please review the following plan we discussed and let me know if I can assist you in the future.  ? ?Screening recommendations/referrals: ?Colonoscopy: Discontinued due to age ?Recommended yearly ophthalmology/optometry visit for glaucoma screening and checkup ?Recommended yearly dental visit for hygiene and checkup ? ?Vaccinations: ?Influenza vaccine: 10/22/2020 ?Pneumococcal vaccine: 04/04/2013, 12/16/2016 ?Tdap vaccine: 11/11/2011; (due 11/10/2021) not covered by Medicare as preventative but will cover as treatment for an injury ?Shingles vaccine: never done   ?Covid-19: 02/15/2019, 03/15/2019, 12/12/2019 ? ?Advanced directives: Living Trust ? ?Conditions/risks identified: Yes ? ?Next appointment: 05/19/2022 at 8:45 a.m. telephone visit with Mignon Pine, Nurse Health Advisor.  If you need to reschedule or cancel, please call [phone]. ? ?Preventive Care 84 Years and Older, Male ?Preventive care refers to lifestyle choices and visits with your health care provider that can promote health and wellness. ?What does preventive care include? ?A yearly physical exam. This is also called an annual well check. ?Dental exams once or twice a year. ?Routine eye exams. Ask your health care provider how often you should have your eyes checked. ?Personal lifestyle choices, including: ?Daily care of your teeth and gums. ?Regular physical activity. ?Eating a healthy diet. ?Avoiding tobacco and drug use. ?Limiting alcohol use. ?Practicing safe sex. ?Taking low doses of aspirin every day. ?Taking vitamin and mineral supplements as recommended by your health care provider. ?What happens during an annual well check? ?The services and screenings done by your health care provider during your annual well check will depend on your age, overall health, lifestyle risk factors, and family history of  disease. ?Counseling  ?Your health care provider may ask you questions about your: ?Alcohol use. ?Tobacco use. ?Drug use. ?Emotional well-being. ?Home and relationship well-being. ?Sexual activity. ?Eating habits. ?History of falls. ?Memory and ability to understand (cognition). ?Work and work Statistician. ?Screening  ?You may have the following tests or measurements: ?Height, weight, and BMI. ?Blood pressure. ?Lipid and cholesterol levels. These may be checked every 5 years, or more frequently if you are over 84 years old. ?Skin check. ?Lung cancer screening. You may have this screening every year starting at age 84 if you have a 30-pack-year history of smoking and currently smoke or have quit within the past 15 years. ?Fecal occult blood test (FOBT) of the stool. You may have this test every year starting at age 84. ?Flexible sigmoidoscopy or colonoscopy. You may have a sigmoidoscopy every 5 years or a colonoscopy every 10 years starting at age 84. ?Prostate cancer screening. Recommendations will vary depending on your family history and other risks. ?Hepatitis C blood test. ?Hepatitis B blood test. ?Sexually transmitted disease (STD) testing. ?Diabetes screening. This is done by checking your blood sugar (glucose) after you have not eaten for a while (fasting). You may have this done every 1-3 years. ?Abdominal aortic aneurysm (AAA) screening. You may need this if you are a current or former smoker. ?Osteoporosis. You may be screened starting at age 84 if you are at high risk. ?Talk with your health care provider about your test results, treatment options, and if necessary, the need for more tests. ?Vaccines  ?Your health care provider may recommend certain vaccines, such as: ?Influenza vaccine. This is recommended every year. ?Tetanus, diphtheria, and acellular pertussis (Tdap, Td) vaccine. You may need a Td booster every 10 years. ?Zoster vaccine. You may need this after age 84. ?  Pneumococcal 13-valent  conjugate (PCV13) vaccine. One dose is recommended after age 84. ?Pneumococcal polysaccharide (PPSV23) vaccine. One dose is recommended after age 84. ?Talk to your health care provider about which screenings and vaccines you need and how often you need them. ?This information is not intended to replace advice given to you by your health care provider. Make sure you discuss any questions you have with your health care provider. ?Document Released: 01/24/2015 Document Revised: 09/17/2015 Document Reviewed: 10/29/2014 ?Elsevier Interactive Patient Education ? 2017 [place]. ? ?Fall Prevention in the Home ?Falls can cause injuries. They can happen to people of all ages. There are many things you can do to make your home safe and to help prevent falls. ?What can I do on the outside of my home? ?Regularly fix the edges of walkways and driveways and fix any cracks. ?Remove anything that might make you trip as you walk through a door, such as a raised step or threshold. ?Trim any bushes or trees on the path to your home. ?Use bright outdoor lighting. ?Clear any walking paths of anything that might make someone trip, such as rocks or tools. ?Regularly check to see if handrails are loose or broken. Make sure that both sides of any steps have handrails. ?Any raised decks and porches should have guardrails on the edges. ?Have any leaves, snow, or ice cleared regularly. ?Use sand or salt on walking paths during winter. ?Clean up any spills in your garage right away. This includes oil or grease spills. ?What can I do in the bathroom? ?Use night lights. ?Install grab bars by the toilet and in the tub and shower. Do not use towel bars as grab bars. ?Use non-skid mats or decals in the tub or shower. ?If you need to sit down in the shower, use a plastic, non-slip stool. ?Keep the floor dry. Clean up any water that spills on the floor as soon as it happens. ?Remove soap buildup in the tub or shower regularly. ?Attach bath mats  securely with double-sided non-slip rug tape. ?Do not have throw rugs and other things on the floor that can make you trip. ?What can I do in the bedroom? ?Use night lights. ?Make sure that you have a light by your bed that is easy to reach. ?Do not use any sheets or blankets that are too big for your bed. They should not hang down onto the floor. ?Have a firm chair that has side arms. You can use this for support while you get dressed. ?Do not have throw rugs and other things on the floor that can make you trip. ?What can I do in the kitchen? ?Clean up any spills right away. ?Avoid walking on wet floors. ?Keep items that you use a lot in easy-to-reach places. ?If you need to reach something above you, use a strong step stool that has a grab bar. ?Keep electrical cords out of the way. ?Do not use floor polish or wax that makes floors slippery. If you must use wax, use non-skid floor wax. ?Do not have throw rugs and other things on the floor that can make you trip. ?What can I do with my stairs? ?Do not leave any items on the stairs. ?Make sure that there are handrails on both sides of the stairs and use them. Fix handrails that are broken or loose. Make sure that handrails are as long as the stairways. ?Check any carpeting to make sure that it is firmly attached to the stairs. Fix any  carpet that is loose or worn. ?Avoid having throw rugs at the top or bottom of the stairs. If you do have throw rugs, attach them to the floor with carpet tape. ?Make sure that you have a light switch at the top of the stairs and the bottom of the stairs. If you do not have them, ask someone to add them for you. ?What else can I do to help prevent falls? ?Wear shoes that: ?Do not have high heels. ?Have rubber bottoms. ?Are comfortable and fit you well. ?Are closed at the toe. Do not wear sandals. ?If you use a stepladder: ?Make sure that it is fully opened. Do not climb a closed stepladder. ?Make sure that both sides of the stepladder  are locked into place. ?Ask someone to hold it for you, if possible. ?Clearly mark and make sure that you can see: ?Any grab bars or handrails. ?First and last steps. ?Where the edge of each step is. ?Use tools that help

## 2021-05-18 NOTE — Progress Notes (Addendum)
?I connected with Jeff Burgess today by telephone and verified that I am speaking with the correct person using two identifiers. ?Location patient: home ?Location provider: work ?Persons participating in the virtual visit: patient, provider. ?  ?I discussed the limitations, risks, security and privacy concerns of performing an evaluation and management service by telephone and the availability of in person appointments. I also discussed with the patient that there may be a patient responsible charge related to this service. The patient expressed understanding and verbally consented to this telephonic visit.  ?  ?Interactive audio and video telecommunications were attempted between this provider and patient, however failed, due to patient having technical difficulties OR patient did not have access to video capability.  We continued and completed visit with audio only. ? ?Some vital signs may be absent or patient reported.  ? ?Time Spent with patient on telephone encounter: 30 minutes ?Subjective:  ? Jeff Burgess is a 86 y.o. male who presents for Medicare Annual/Subsequent preventive examination. ? ?Review of Systems    ? ?Cardiac Risk Factors include: advanced age (>29mn, >>3women);diabetes mellitus;dyslipidemia;hypertension;male gender;family history of premature cardiovascular disease ? ?   ?Objective:  ?  ?There were no vitals filed for this visit. ?There is no height or weight on file to calculate BMI. ? ? ?  05/18/2021  ?  8:52 AM 06/27/2019  ? 11:31 AM 09/19/2017  ?  8:30 AM 09/16/2016  ?  9:52 AM 10/08/2015  ?  7:05 AM 06/02/2015  ? 11:19 AM  ?Advanced Directives  ?Does Patient Have a Medical Advance Directive? No No Yes No No Yes  ?Type of AScientist, physiologicalof AChenoaLiving will   Living will  ?Does patient want to make changes to medical advance directive? No - Patient declined   Yes (ED - Information included in AVS) No - Patient declined   ?Would patient like information on creating  a medical advance directive?  No - Patient declined   No - patient declined information   ? ? ?Current Medications (verified) ?Outpatient Encounter Medications as of 05/18/2021  ?Medication Sig  ? amLODipine (NORVASC) 5 MG tablet TAKE 1 TABLET BY MOUTH  DAILY  ? aspirin 81 MG tablet Take 2 tablets (162 mg total) by mouth daily. (Patient taking differently: Take 81 mg by mouth in the morning and at bedtime.)  ? cholecalciferol (VITAMIN D) 25 MCG (1000 UT) tablet Take 1 tablet (1,000 Units total) by mouth every morning. Follow-up due in April must see provider for refills (Patient taking differently: Take 1,000 Units by mouth daily. Follow-up due in April must see provider for refills)  ? Cinnamon 500 MG capsule Take 500 mg by mouth in the morning and at bedtime.  ? metFORMIN (GLUCOPHAGE-XR) 500 MG 24 hr tablet TAKE 1 TABLET BY MOUTH  TWICE DAILY  ? methocarbamol (ROBAXIN) 500 MG tablet Take 1 tablet (500 mg total) by mouth every 6 (six) hours as needed for muscle spasms.  ? oxyCODONE (OXY IR/ROXICODONE) 5 MG immediate release tablet Take 1 tablet (5 mg total) by mouth every 3 (three) hours as needed for moderate pain ((score 4 to 6)).  ? pravastatin (PRAVACHOL) 40 MG tablet TAKE 1 TABLET BY MOUTH  DAILY  ? repaglinide (PRANDIN) 1 MG tablet Take 1 tablet (1 mg total) by mouth 3 (three) times daily before meals.  ? telmisartan-hydrochlorothiazide (MICARDIS HCT) 80-25 MG tablet TAKE 1 TABLET BY MOUTH  DAILY  ? ?No facility-administered encounter medications on file as  of 05/18/2021.  ? ? ?Allergies (verified) ?Coreg [carvedilol], Lovastatin, and Morphine sulfate  ? ?History: ?Past Medical History:  ?Diagnosis Date  ? Benign localized prostatic hyperplasia with lower urinary tract symptoms (LUTS)   ? Bladder cancer [facility]) dx 05/ 2017--  urologist- dr Pilar Jarvis  ? TCC , Ta of bladder  s/p TURBT 06-02-2015  ? CAD (coronary artery disease)   ? no cardiologist--  followed by pcp,  dr plontnikov  ? Chronic low back pain with sciatica    ? CKD (chronic kidney disease), stage III ([place])   ? Diabetes mellitus, type 2 ([place])   ? ED (erectile dysfunction)   ? Full dentures   ? History of ETT   ? 08-09-2014   fair exercise tolerance, no chest pain, normal BP response, no ST changes (baseline RBBB);  negative adequate ETT  ? Hyperlipidemia   ? Hypertension   ? Hypogonadism male   ? OA (osteoarthritis)   ? Right bundle branch block (RBBB)   ? S/P CABG x 1   ? 1998  ? Sigmoid diverticulosis   ? Ventral hernia   ? midline  ? Wears glasses   ? ?Past Surgical History:  ?Procedure Laterality Date  ? COLONOSCOPY WITH PROPOFOL  02-01-2012  ? CORONARY ARTERY BYPASS GRAFT  1998  at [facility]  ? x1  vessel   ? CYSTOSCOPY WITH BIOPSY N/A 10/08/2015  ? Procedure: CYSTOSCOPY WITH BLADDER BIOPSY;  Surgeon: Nickie Retort, MD;  Location: [facility];  Service: Urology;  Laterality: N/A;  ? CYSTOSCOPY WITH FULGERATION N/A 10/08/2015  ? Procedure: CYSTOSCOPY WITH FULGERATION;  Surgeon: Nickie Retort, MD;  Location: [facility];  Service: Urology;  Laterality: N/A;  ? TONSILLECTOMY  age 4  ? TRANSURETHRAL RESECTION OF BLADDER TUMOR WITH GYRUS (TURBT-GYRUS) N/A 06/02/2015  ? Procedure: TRANSURETHRAL RESECTION OF BLADDER TUMOR WITH GYRUS (TURBT-GYRUS);  Surgeon: Nickie Retort, MD;  Location[facility];  Service: Urology;  Laterality: N/A;  ? ?Family History  ?Problem Relation Age of Onset  ? Arthritis Mother 25  ?     RA  ? Alcohol abuse Father 38  ?     exposure  ? Diabetes Brother   ? Kidney disease Brother   ? Heart disease Brother   ? Hypertension Other   ? Colon cancer Neg Hx   ? Esophageal cancer Neg Hx   ? Rectal cancer Neg Hx   ? Stomach cancer Neg Hx   ? ?Social History  ? ?Socioeconomic History  ? Marital status: Married  ?  Spouse name: Not on file  ? Number of children: Not on file  ? Years of education: Not on file  ? Highest education level: Not on file  ?Occupational History  ? Occupation:  retired  ?  Comment: but working fulltime  ?Tobacco Use  ? Smoking status: Former  ?  Years: 15.00  ?  Types: Cigarettes  ?  Quit date: 01/18/1971  ?  Years since quitting: 50.3  ? Smokeless tobacco: Current  ?  Types: Snuff  ?Vaping Use  ? Vaping Use: Never used  ?Substance and Sexual Activity  ? Alcohol use: No  ? Drug use: No  ? Sexual activity: Not Currently  ?Other Topics Concern  ? Not on file  ?Social History Narrative  ? Regular exercise-yes  ? ?Social Determinants of Health  ? ?Financial Resource Strain: Low Risk   ? Difficulty of Paying Living Expenses: Not hard at  all  ?Food Insecurity: No Food Insecurity  ? Worried About Charity fundraiser in the Last Year: Never true  ? Ran Out of Food in the Last Year: Never true  ?Transportation Needs: No Transportation Needs  ? Lack of Transportation (Medical): No  ? Lack of Transportation (Non-Medical): No  ?Physical Activity: Sufficiently Active  ? Days of Exercise per Week: 5 days  ? Minutes of Exercise per Session: 30 min  ?Stress: No Stress Concern Present  ? Feeling of Stress : Not at all  ?Social Connections: Socially Integrated  ? Frequency of Communication with Friends and Family: More than three times a week  ? Frequency of Social Gatherings with Friends and Family: More than three times a week  ? Attends Religious Services: More than 4 times per year  ? Active Member of Clubs or Organizations: Yes  ? Attends Archivist Meetings: More than 4 times per year  ? Marital Status: Married  ? ? ?Tobacco Counseling ?Ready to quit: Not Answered ?Counseling given: Not Answered ? ? ?Clinical Intake: ? ?Pre-visit preparation completed: Yes ? ?Pain : No/denies pain ? ?  ? ?Nutritional Risks: None ?Diabetes: Yes ?CBG done?: No ?Did pt. bring in CBG monitor from home?: No ? ?How often do you need to have someone help you when you read instructions, pamphlets, or other written materials from your doctor or pharmacy?: 1 - Never ?What is the last grade level you  completed in school?: GED ? ?Diabetic? yes ? ?Interpreter Needed?: No ? ?Information entered by :: Lisette Abu, LPN ? ? ?Activities of Daily Living ? ?  05/18/2021  ?  9:03 AM  ?In your present state of

## 2021-06-01 ENCOUNTER — Other Ambulatory Visit: Payer: Self-pay | Admitting: Internal Medicine

## 2021-06-08 ENCOUNTER — Other Ambulatory Visit: Payer: Self-pay | Admitting: Internal Medicine

## 2021-07-01 ENCOUNTER — Telehealth: Payer: Self-pay | Admitting: Internal Medicine

## 2021-07-01 NOTE — Telephone Encounter (Signed)
Pt requested telmisartan-hydrochlorothiazide (MICARDIS HCT) 80-25 MG tablet  [facility] ([place], [place] Phone:  [phone]

## 2021-07-02 ENCOUNTER — Other Ambulatory Visit: Payer: Self-pay

## 2021-07-02 MED ORDER — TELMISARTAN-HCTZ 80-25 MG PO TABS: 1.0000 | ORAL_TABLET | Freq: Every day | ORAL | 3 refills | Status: DC

## 2021-07-23 ENCOUNTER — Encounter: Payer: Self-pay | Admitting: Internal Medicine

## 2021-07-23 ENCOUNTER — Ambulatory Visit (INDEPENDENT_AMBULATORY_CARE_PROVIDER_SITE_OTHER): Payer: Medicare Other | Admitting: Internal Medicine

## 2021-07-23 DIAGNOSIS — M48061 Spinal stenosis, lumbar region without neurogenic claudication: Secondary | ICD-10-CM

## 2021-07-23 DIAGNOSIS — G8929 Other chronic pain: Secondary | ICD-10-CM | POA: Diagnosis not present

## 2021-07-23 DIAGNOSIS — I10 Essential (primary) hypertension: Secondary | ICD-10-CM | POA: Diagnosis not present

## 2021-07-23 DIAGNOSIS — E1122 Type 2 diabetes mellitus with diabetic chronic kidney disease: Secondary | ICD-10-CM

## 2021-07-23 DIAGNOSIS — M5440 Lumbago with sciatica, unspecified side: Secondary | ICD-10-CM | POA: Diagnosis not present

## 2021-07-23 DIAGNOSIS — N1831 Chronic kidney disease, stage 3a: Secondary | ICD-10-CM

## 2021-07-23 LAB — COMPREHENSIVE METABOLIC PANEL
ALT: 17 U/L (ref 0–53)
AST: 18 U/L (ref 0–37)
Albumin: 4.4 g/dL (ref 3.5–5.2)
Alkaline Phosphatase: 74 U/L (ref 39–117)
BUN: 25 mg/dL — ABNORMAL HIGH (ref 6–23)
CO2: 28 mEq/L (ref 19–32)
Calcium: 9.8 mg/dL (ref 8.4–10.5)
Chloride: 103 mEq/L (ref 96–112)
Creatinine, Ser: 1.31 mg/dL (ref 0.40–1.50)
GFR: 50.04 mL/min — ABNORMAL LOW (ref >60.00)
Glucose, Bld: 156 mg/dL — ABNORMAL HIGH (ref 70–99)
Potassium: 3.9 mEq/L (ref 3.5–5.1)
Sodium: 139 mEq/L (ref 135–145)
Total Bilirubin: 0.6 mg/dL (ref 0.2–1.2)
Total Protein: 6.8 g/dL (ref 6.0–8.3)

## 2021-07-23 LAB — HEMOGLOBIN A1C: Hgb A1c MFr Bld: 7.1 % — ABNORMAL HIGH (ref 4.6–6.5)

## 2021-07-23 LAB — TSH: TSH: 1.51 u[IU]/mL (ref 0.35–5.50)

## 2021-07-23 NOTE — Assessment & Plan Note (Addendum)
Tylenol prn, Lions mane, CBD gummies Try TENS/NMES

## 2021-07-23 NOTE — Assessment & Plan Note (Signed)
Stable

## 2021-07-23 NOTE — Assessment & Plan Note (Signed)
Cont on Telmisartan-HCT, Amlodipine  5 mg/d

## 2021-07-23 NOTE — Assessment & Plan Note (Signed)
Cont on Metformin 500 mg bid, Repaginate tid. We discussed Rybelsus option - not interested

## 2021-07-23 NOTE — Progress Notes (Signed)
Subjective:  Patient ID: Jeff Burgess, male    DOB: 02-Jul-1935  Age: 86 y.o. MRN: [id number]  CC: No chief complaint on file.   HPI Jeff Burgess presents for HTN, DM, CAD, gait disorder  Outpatient Medications Prior to Visit  Medication Sig Dispense Refill   amLODipine (NORVASC) 5 MG tablet TAKE 1 TABLET BY MOUTH  DAILY 90 tablet 3   aspirin 81 MG tablet Take 2 tablets (162 mg total) by mouth daily. (Patient taking differently: Take 81 mg by mouth in the morning and at bedtime.) 100 tablet 3   cholecalciferol (VITAMIN D) 25 MCG (1000 UT) tablet Take 1 tablet (1,000 Units total) by mouth every morning. Follow-up due in April must see provider for refills (Patient taking differently: Take 1,000 Units by mouth daily. Follow-up due in April must see provider for refills) 90 tablet 0   Cinnamon 500 MG capsule Take 500 mg by mouth in the morning and at bedtime.     metFORMIN (GLUCOPHAGE-XR) 500 MG 24 hr tablet TAKE 1 TABLET BY MOUTH  TWICE DAILY 200 tablet 2   methocarbamol (ROBAXIN) 500 MG tablet Take 1 tablet (500 mg total) by mouth every 6 (six) hours as needed for muscle spasms. 60 tablet 1   oxyCODONE (OXY IR/ROXICODONE) 5 MG immediate release tablet Take 1 tablet (5 mg total) by mouth every 3 (three) hours as needed for moderate pain ((score 4 to 6)). 30 tablet 0   pravastatin (PRAVACHOL) 40 MG tablet TAKE 1 TABLET BY MOUTH  DAILY 90 tablet 3   repaglinide (PRANDIN) 1 MG tablet Take 1 tablet (1 mg total) by mouth 3 (three) times daily before meals. 90 tablet 11   telmisartan-hydrochlorothiazide (MICARDIS HCT) 80-25 MG tablet Take 1 tablet by mouth daily. 90 tablet 3   No facility-administered medications prior to visit.    ROS: Review of Systems  Constitutional:  Positive for fatigue. Negative for appetite change and unexpected weight change.  HENT:  Negative for congestion, nosebleeds, sneezing, sore throat and trouble swallowing.   Eyes:  Negative for itching and visual  disturbance.  Respiratory:  Negative for cough.   Cardiovascular:  Negative for chest pain, palpitations and leg swelling.  Gastrointestinal:  Negative for abdominal distention, blood in stool, diarrhea and nausea.  Genitourinary:  Negative for frequency and hematuria.  Musculoskeletal:  Positive for back pain and gait problem. Negative for joint swelling and neck pain.  Skin:  Negative for rash.  Neurological:  Positive for weakness. Negative for dizziness, tremors and speech difficulty.  Psychiatric/Behavioral:  Negative for agitation, dysphoric mood, sleep disturbance and suicidal ideas. The patient is nervous/anxious.     Objective:  BP 110/70 (BP Location: Left Arm, Patient Position: Sitting, Cuff Size: Normal)   Pulse 70   Temp 98.1 F (36.7 C) (Oral)   Ht '5\' 9"'$  (1.753 m)   Wt 184 lb (83.5 kg)   SpO2 95%   BMI 27.17 kg/m   BP Readings from Last 3 Encounters:  07/23/21 110/70  04/23/21 130/70  01/22/21 138/68    Wt Readings from Last 3 Encounters:  07/23/21 184 lb (83.5 kg)  04/23/21 188 lb (85.3 kg)  01/22/21 196 lb 9.6 oz (89.2 kg)    Physical Exam Constitutional:      General: He is not in acute distress.    Appearance: He is well-developed.     Comments: NAD  Eyes:     Conjunctiva/sclera: Conjunctivae normal.     Pupils: Pupils are equal,  round, and reactive to light.  Neck:     Thyroid: No thyromegaly.     Vascular: No JVD.  Cardiovascular:     Rate and Rhythm: Normal rate and regular rhythm.     Heart sounds: Normal heart sounds. No murmur heard.    No friction rub. No gallop.  Pulmonary:     Effort: Pulmonary effort is normal. No respiratory distress.     Breath sounds: Normal breath sounds. No wheezing or rales.  Chest:     Chest wall: No tenderness.  Abdominal:     General: Bowel sounds are normal. There is no distension.     Palpations: Abdomen is soft. There is no mass.     Tenderness: There is abdominal tenderness. There is no guarding or  rebound.  Musculoskeletal:        General: No tenderness. Normal range of motion.     Cervical back: Normal range of motion.  Lymphadenopathy:     Cervical: No cervical adenopathy.  Skin:    General: Skin is warm and dry.     Findings: No rash.  Neurological:     Mental Status: He is alert and oriented to person, place, and time.     Cranial Nerves: No cranial nerve deficit.     Motor: Weakness present. No abnormal muscle tone.     Coordination: Coordination abnormal.     Gait: Gait abnormal.     Deep Tendon Reflexes: Reflexes are normal and symmetric.  Psychiatric:        Behavior: Behavior normal.        Thought Content: Thought content normal.        Judgment: Judgment normal.    Ataxic gait - better Using a pole  Lab Results  Component Value Date   WBC 10.8 (H) 01/22/2021   HGB 14.5 01/22/2021   HCT 43.2 01/22/2021   PLT 204.0 01/22/2021   GLUCOSE 148 (H) 04/23/2021   CHOL 123 01/22/2021   TRIG 119.0 01/22/2021   HDL 34.60 (L) 01/22/2021   LDLDIRECT 78.4 11/02/2011   LDLCALC 64 01/22/2021   ALT 18 04/23/2021   AST 17 04/23/2021   NA 137 04/23/2021   K 3.8 04/23/2021   CL 102 04/23/2021   CREATININE 1.20 04/23/2021   BUN 22 04/23/2021   CO2 28 04/23/2021   TSH 1.60 01/22/2021   PSA 2.67 01/22/2021   INR 1.02 10/08/2015   HGBA1C 7.2 (H) 04/23/2021    VAS [place] LOWER EXTREMITY VENOUS (DVT)  Result Date: 08/01/2019  Lower Venous DVT Study Indications: Edema, Pain, and Swelling. Other Indications: Patient reports that lower extremity edema has resolved. Risk Factors: Surgery Recent back surgery. Performing Technologist: Delorise Shiner RVT  Examination Guidelines: A complete evaluation includes B-mode imaging, spectral Doppler, color Doppler, and power Doppler as needed of all accessible portions of each vessel. Bilateral testing is considered an integral part of a complete examination. Limited examinations for reoccurring indications may be performed as noted. The  reflux portion of the exam is performed with the patient in reverse Trendelenburg.  +---------+---------------+---------+-----------+----------+--------------+ RIGHT    CompressibilityPhasicitySpontaneityPropertiesThrombus Aging +---------+---------------+---------+-----------+----------+--------------+ CFV      Full           Yes      Yes                                 +---------+---------------+---------+-----------+----------+--------------+ SFJ      Full  Yes      Yes                                 +---------+---------------+---------+-----------+----------+--------------+ FV Prox  Full           Yes      Yes                                 +---------+---------------+---------+-----------+----------+--------------+ FV Mid   Full           Yes      Yes                                 +---------+---------------+---------+-----------+----------+--------------+ FV DistalFull           Yes      Yes                                 +---------+---------------+---------+-----------+----------+--------------+ PFV      Full                    Yes                                 +---------+---------------+---------+-----------+----------+--------------+ POP      Full           Yes      Yes                                 +---------+---------------+---------+-----------+----------+--------------+ PTV      Full                                                        +---------+---------------+---------+-----------+----------+--------------+ PERO     Full                                                        +---------+---------------+---------+-----------+----------+--------------+ GSV      Full                    Yes                                 +---------+---------------+---------+-----------+----------+--------------+ SSV      Full                                                         +---------+---------------+---------+-----------+----------+--------------+   +---------+---------------+---------+-----------+----------+--------------+ LEFT     CompressibilityPhasicitySpontaneityPropertiesThrombus Aging +---------+---------------+---------+-----------+----------+--------------+ CFV      Full           Yes  Yes                                 +---------+---------------+---------+-----------+----------+--------------+ SFJ      Full           Yes      Yes                                 +---------+---------------+---------+-----------+----------+--------------+ FV Prox  Full           Yes      Yes                                 +---------+---------------+---------+-----------+----------+--------------+ FV Mid   Full           Yes      Yes                                 +---------+---------------+---------+-----------+----------+--------------+ FV DistalFull           Yes      Yes                                 +---------+---------------+---------+-----------+----------+--------------+ PFV      Full                    Yes                                 +---------+---------------+---------+-----------+----------+--------------+ POP      Full           Yes      Yes                                 +---------+---------------+---------+-----------+----------+--------------+ PTV      Full                    Yes                                 +---------+---------------+---------+-----------+----------+--------------+ PERO     Full                    Yes                                 +---------+---------------+---------+-----------+----------+--------------+ GSV      Full                    Yes                                 +---------+---------------+---------+-----------+----------+--------------+ SSV      Full                                                         +---------+---------------+---------+-----------+----------+--------------+  Summary: BILATERAL: - No evidence of deep vein thrombosis seen in the lower extremities, bilaterally. - No evidence of superficial venous thrombosis in the lower extremities, bilaterally. -No evidence of popliteal cyst, bilaterally.   *See table(s) above for measurements and observations. Electronically signed by Deitra Mayo MD on 08/01/2019 at 12:45:24 PM.    Final     Assessment & Plan:   Problem List Items Addressed This Visit     DM (diabetes mellitus), type 2 with renal complications ([place])    Cont on Metformin 500 mg bid, Repaginate tid. We discussed Rybelsus option - not interested      Relevant Orders   TSH   Comprehensive metabolic panel   Hemoglobin A1c   Essential hypertension    Cont on Telmisartan-HCT, Amlodipine  5 mg/d      Relevant Orders   TSH   Comprehensive metabolic panel   Hemoglobin A1c   LOW BACK PAIN    Stable      Lumbar foraminal stenosis    Tylenol prn, Lions mane, CBD gummies Try TENS/NMES         No orders of the defined types were placed in this encounter.     Follow-up: Return in about 3 months (around 10/23/2021) for a follow-up visit.  Walker Kehr, MD

## 2021-08-17 ENCOUNTER — Ambulatory Visit: Payer: Self-pay | Admitting: Licensed Clinical Social Worker

## 2021-08-17 NOTE — Patient Outreach (Signed)
  Care Coordination   08/17/2021 Name: NAEEM QUILLIN MRN: [id number] DOB: 1935/02/04   Care Coordination Outreach Attempts:  An unsuccessful telephone outreach was attempted today to offer the patient information about available care coordination services as a benefit of their health plan.   Follow Up Plan:  Additional outreach attempts will be made to offer the patient care coordination information and services.   Encounter Outcome:  No Answer  Care Coordination Interventions Activated:  No   Care Coordination Interventions:  No, not indicated    Casimer Lanius, [place] [phone]

## 2021-09-08 ENCOUNTER — Other Ambulatory Visit: Payer: Self-pay | Admitting: Internal Medicine

## 2021-09-08 ENCOUNTER — Ambulatory Visit: Payer: Self-pay

## 2021-09-08 NOTE — Patient Outreach (Signed)
  Care Coordination   09/08/2021 Name: RISHI VICARIO MRN: [id number] DOB: [birth year]   Care Coordination Outreach Attempts:  A second unsuccessful outreach was attempted today to offer the patient with information about available care coordination services as a benefit of their health plan.     Follow Up Plan:  Additional outreach attempts will be made to offer the patient care coordination information and services.   Encounter Outcome:  No Answer  Care Coordination Interventions Activated:  No   Care Coordination Interventions:  No, not indicated    Thea Silversmith, RN, MSN, BSN, CCM Care Coordinator [phone]

## 2021-09-18 DIAGNOSIS — E119 Type 2 diabetes mellitus without complications: Secondary | ICD-10-CM | POA: Diagnosis not present

## 2021-09-18 DIAGNOSIS — H2513 Age-related nuclear cataract, bilateral: Secondary | ICD-10-CM | POA: Diagnosis not present

## 2021-09-23 ENCOUNTER — Telehealth: Payer: Self-pay

## 2021-09-23 NOTE — Patient Outreach (Signed)
  Care Coordination   09/23/2021 Name: FOUAD TAUL MRN: [id number] DOB: [birth year]   Care Coordination Outreach Attempts:  A third unsuccessful outreach was attempted today to offer the patient with information about available care coordination services as a benefit of their health plan.   Follow Up Plan:  No further outreach attempts will be made at this time. We have been unable to contact the patient to offer or enroll patient in care coordination services  Encounter Outcome:  No Answer  Care Coordination Interventions Activated:  No   Care Coordination Interventions:  No, not indicated    Thea Silversmith, RN, MSN, BSN, [place] Coordinator [phone]

## 2021-10-26 ENCOUNTER — Ambulatory Visit (INDEPENDENT_AMBULATORY_CARE_PROVIDER_SITE_OTHER): Payer: Medicare Other | Admitting: Internal Medicine

## 2021-10-26 ENCOUNTER — Encounter: Payer: Self-pay | Admitting: Internal Medicine

## 2021-10-26 VITALS — BP 142/80 | HR 77 | Temp 97.7°F | Ht 69.0 in | Wt 184.0 lb

## 2021-10-26 DIAGNOSIS — G8929 Other chronic pain: Secondary | ICD-10-CM | POA: Diagnosis not present

## 2021-10-26 DIAGNOSIS — N1831 Chronic kidney disease, stage 3a: Secondary | ICD-10-CM

## 2021-10-26 DIAGNOSIS — M25532 Pain in left wrist: Secondary | ICD-10-CM | POA: Diagnosis not present

## 2021-10-26 DIAGNOSIS — M48061 Spinal stenosis, lumbar region without neurogenic claudication: Secondary | ICD-10-CM

## 2021-10-26 DIAGNOSIS — Z23 Encounter for immunization: Secondary | ICD-10-CM | POA: Diagnosis not present

## 2021-10-26 DIAGNOSIS — I10 Essential (primary) hypertension: Secondary | ICD-10-CM

## 2021-10-26 DIAGNOSIS — E1122 Type 2 diabetes mellitus with diabetic chronic kidney disease: Secondary | ICD-10-CM | POA: Diagnosis not present

## 2021-10-26 DIAGNOSIS — M5440 Lumbago with sciatica, unspecified side: Secondary | ICD-10-CM

## 2021-10-26 LAB — COMPREHENSIVE METABOLIC PANEL
ALT: 20 U/L (ref 0–53)
AST: 18 U/L (ref 0–37)
Albumin: 4.7 g/dL (ref 3.5–5.2)
Alkaline Phosphatase: 81 U/L (ref 39–117)
BUN: 30 mg/dL — ABNORMAL HIGH (ref 6–23)
CO2: 27 mEq/L (ref 19–32)
Calcium: 10.3 mg/dL (ref 8.4–10.5)
Chloride: 104 mEq/L (ref 96–112)
Creatinine, Ser: 1.42 mg/dL (ref 0.40–1.50)
GFR: 45.34 mL/min — ABNORMAL LOW (ref >60.00)
Glucose, Bld: 155 mg/dL — ABNORMAL HIGH (ref 70–99)
Potassium: 4.7 mEq/L (ref 3.5–5.1)
Sodium: 142 mEq/L (ref 135–145)
Total Bilirubin: 0.7 mg/dL (ref 0.2–1.2)
Total Protein: 7.4 g/dL (ref 6.0–8.3)

## 2021-10-26 LAB — CBC WITH DIFFERENTIAL/PLATELET
Basophils Absolute: 0 10*3/uL (ref 0.0–0.1)
Basophils Relative: 0.4 % (ref 0.0–3.0)
Eosinophils Absolute: 0 10*3/uL (ref 0.0–0.7)
Eosinophils Relative: 0.4 % (ref 0.0–5.0)
HCT: 43.4 % (ref 39.0–52.0)
Hemoglobin: 14.6 g/dL (ref 13.0–17.0)
Lymphocytes Relative: 14.9 % (ref 12.0–46.0)
Lymphs Abs: 1.6 10*3/uL (ref 0.7–4.0)
MCHC: 33.6 g/dL (ref 30.0–36.0)
MCV: 88.8 fl (ref 78.0–100.0)
Monocytes Absolute: 0.9 10*3/uL (ref 0.1–1.0)
Monocytes Relative: 8.3 % (ref 3.0–12.0)
Neutro Abs: 8.1 10*3/uL — ABNORMAL HIGH (ref 1.4–7.7)
Neutrophils Relative %: 76 % (ref 43.0–77.0)
Platelets: 229 10*3/uL (ref 150.0–400.0)
RBC: 4.89 Mil/uL (ref 4.22–5.81)
RDW: 13.3 % (ref 11.5–15.5)
WBC: 10.7 10*3/uL — ABNORMAL HIGH (ref 4.0–10.5)

## 2021-10-26 LAB — LIPID PANEL
Cholesterol: 122 mg/dL (ref 0–200)
HDL: 33.9 mg/dL — ABNORMAL LOW (ref >39.00)
LDL Cholesterol: 56 mg/dL (ref 0–99)
NonHDL: 88.37
Total CHOL/HDL Ratio: 4
Triglycerides: 160 mg/dL — ABNORMAL HIGH (ref 0.0–149.0)
VLDL: 32 mg/dL (ref 0.0–40.0)

## 2021-10-26 LAB — HEMOGLOBIN A1C: Hgb A1c MFr Bld: 7 % — ABNORMAL HIGH (ref 4.6–6.5)

## 2021-10-26 MED ORDER — TRAMADOL HCL 50 MG PO TABS: 50.0000 mg | ORAL_TABLET | Freq: Four times a day (QID) | ORAL | 2 refills | Status: AC | PRN

## 2021-10-26 NOTE — Assessment & Plan Note (Signed)
On Celebrex prn 1/week or so

## 2021-10-26 NOTE — Patient Instructions (Signed)
Blue-Emu cream -- use 2-3 times a day ? ?

## 2021-10-26 NOTE — Progress Notes (Signed)
Subjective:  Patient ID: Jeff Burgess, male    DOB: 04-07-1935  Age: 86 y.o. MRN: [id number]  CC: Follow-up (3 MONTH F/U- FLU SHOT) and Wrist Pain (WANT SOMETHING FOR ARTHRITIS PAIN)   HPI Jeff Burgess presents for DM, LBP, gait disorder  Outpatient Medications Prior to Visit  Medication Sig Dispense Refill   amLODipine (NORVASC) 5 MG tablet TAKE 1 TABLET BY MOUTH  DAILY 90 tablet 3   aspirin 81 MG tablet Take 2 tablets (162 mg total) by mouth daily. (Patient taking differently: Take 81 mg by mouth in the morning and at bedtime.) 100 tablet 3   cholecalciferol (VITAMIN D) 25 MCG (1000 UT) tablet Take 1 tablet (1,000 Units total) by mouth every morning. Follow-up due in April must see provider for refills (Patient taking differently: Take 1,000 Units by mouth daily. Follow-up due in April must see provider for refills) 90 tablet 0   Cinnamon 500 MG capsule Take 500 mg by mouth in the morning and at bedtime.     metFORMIN (GLUCOPHAGE-XR) 500 MG 24 hr tablet TAKE 1 TABLET BY MOUTH  TWICE DAILY 200 tablet 2   methocarbamol (ROBAXIN) 500 MG tablet Take 1 tablet (500 mg total) by mouth every 6 (six) hours as needed for muscle spasms. 60 tablet 1   oxyCODONE (OXY IR/ROXICODONE) 5 MG immediate release tablet Take 1 tablet (5 mg total) by mouth every 3 (three) hours as needed for moderate pain ((score 4 to 6)). 30 tablet 0   pravastatin (PRAVACHOL) 40 MG tablet TAKE 1 TABLET BY MOUTH DAILY 100 tablet 2   telmisartan-hydrochlorothiazide (MICARDIS HCT) 80-25 MG tablet Take 1 tablet by mouth daily. 90 tablet 3   repaglinide (PRANDIN) 1 MG tablet Take 1 tablet (1 mg total) by mouth 3 (three) times daily before meals. (Patient not taking: Reported on 10/26/2021) 90 tablet 11   No facility-administered medications prior to visit.    ROS: Review of Systems  Constitutional:  Positive for fatigue. Negative for appetite change and unexpected weight change.  HENT:  Negative for congestion,  nosebleeds, sneezing, sore throat and trouble swallowing.   Eyes:  Negative for itching and visual disturbance.  Respiratory:  Negative for cough.   Cardiovascular:  Negative for chest pain, palpitations and leg swelling.  Gastrointestinal:  Negative for abdominal distention, blood in stool, diarrhea and nausea.  Genitourinary:  Negative for frequency and hematuria.  Musculoskeletal:  Positive for arthralgias, back pain and gait problem. Negative for joint swelling and neck pain.  Skin:  Negative for rash.  Neurological:  Positive for weakness. Negative for dizziness, tremors and speech difficulty.  Psychiatric/Behavioral:  Negative for agitation, dysphoric mood and sleep disturbance. The patient is not nervous/anxious.     Objective:  BP (!) 142/80 (BP Location: Left Arm)   Pulse 77   Temp 97.7 F (36.5 C) (Oral)   Ht '5\' 9"'$  (1.753 m)   Wt 184 lb (83.5 kg)   SpO2 98%   BMI 27.17 kg/m   BP Readings from Last 3 Encounters:  10/26/21 (!) 142/80  07/23/21 110/70  04/23/21 130/70    Wt Readings from Last 3 Encounters:  10/26/21 184 lb (83.5 kg)  07/23/21 184 lb (83.5 kg)  04/23/21 188 lb (85.3 kg)    Physical Exam Constitutional:      General: He is not in acute distress.    Appearance: He is well-developed. He is obese.     Comments: NAD  Eyes:     Conjunctiva/sclera:  Conjunctivae normal.     Pupils: Pupils are equal, round, and reactive to light.  Neck:     Thyroid: No thyromegaly.     Vascular: No JVD.  Cardiovascular:     Rate and Rhythm: Normal rate and regular rhythm.     Heart sounds: Normal heart sounds. No murmur heard.    No friction rub. No gallop.  Pulmonary:     Effort: Pulmonary effort is normal. No respiratory distress.     Breath sounds: Normal breath sounds. No wheezing or rales.  Chest:     Chest wall: No tenderness.  Abdominal:     General: Bowel sounds are normal. There is no distension.     Palpations: Abdomen is soft. There is no mass.      Tenderness: There is no abdominal tenderness. There is no guarding or rebound.  Musculoskeletal:        General: Tenderness present. Normal range of motion.     Cervical back: Normal range of motion.     Right lower leg: No edema.     Left lower leg: No edema.  Lymphadenopathy:     Cervical: No cervical adenopathy.  Skin:    General: Skin is warm and dry.     Findings: No rash.  Neurological:     Mental Status: He is alert and oriented to person, place, and time.     Cranial Nerves: No cranial nerve deficit.     Motor: Weakness present. No abnormal muscle tone.     Coordination: Coordination abnormal.     Gait: Gait abnormal.     Deep Tendon Reflexes: Reflexes are normal and symmetric.  Psychiatric:        Behavior: Behavior normal.        Thought Content: Thought content normal.        Judgment: Judgment normal.   Using a cane; unsteady  Lab Results  Component Value Date   WBC 10.8 (H) 01/22/2021   HGB 14.5 01/22/2021   HCT 43.2 01/22/2021   PLT 204.0 01/22/2021   GLUCOSE 156 (H) 07/23/2021   CHOL 123 01/22/2021   TRIG 119.0 01/22/2021   HDL 34.60 (L) 01/22/2021   LDLDIRECT 78.4 11/02/2011   LDLCALC 64 01/22/2021   ALT 17 07/23/2021   AST 18 07/23/2021   NA 139 07/23/2021   K 3.9 07/23/2021   CL 103 07/23/2021   CREATININE 1.31 07/23/2021   BUN 25 (H) 07/23/2021   CO2 28 07/23/2021   TSH 1.51 07/23/2021   PSA 2.67 01/22/2021   INR 1.02 10/08/2015   HGBA1C 7.1 (H) 07/23/2021    VAS [place] LOWER EXTREMITY VENOUS (DVT)  Result Date: 08/01/2019  Lower Venous DVT Study Indications: Edema, Pain, and Swelling. Other Indications: Patient reports that lower extremity edema has resolved. Risk Factors: Surgery Recent back surgery. Performing Technologist: Delorise Shiner RVT  Examination Guidelines: A complete evaluation includes B-mode imaging, spectral Doppler, color Doppler, and power Doppler as needed of all accessible portions of each vessel. Bilateral testing is considered  an integral part of a complete examination. Limited examinations for reoccurring indications may be performed as noted. The reflux portion of the exam is performed with the patient in reverse Trendelenburg.  +---------+---------------+---------+-----------+----------+--------------+ RIGHT    CompressibilityPhasicitySpontaneityPropertiesThrombus Aging +---------+---------------+---------+-----------+----------+--------------+ CFV      Full           Yes      Yes                                 +---------+---------------+---------+-----------+----------+--------------+  SFJ      Full           Yes      Yes                                 +---------+---------------+---------+-----------+----------+--------------+ FV Prox  Full           Yes      Yes                                 +---------+---------------+---------+-----------+----------+--------------+ FV Mid   Full           Yes      Yes                                 +---------+---------------+---------+-----------+----------+--------------+ FV DistalFull           Yes      Yes                                 +---------+---------------+---------+-----------+----------+--------------+ PFV      Full                    Yes                                 +---------+---------------+---------+-----------+----------+--------------+ POP      Full           Yes      Yes                                 +---------+---------------+---------+-----------+----------+--------------+ PTV      Full                                                        +---------+---------------+---------+-----------+----------+--------------+ PERO     Full                                                        +---------+---------------+---------+-----------+----------+--------------+ GSV      Full                    Yes                                  +---------+---------------+---------+-----------+----------+--------------+ SSV      Full                                                        +---------+---------------+---------+-----------+----------+--------------+   +---------+---------------+---------+-----------+----------+--------------+ LEFT     CompressibilityPhasicitySpontaneityPropertiesThrombus Aging +---------+---------------+---------+-----------+----------+--------------+ CFV  Full           Yes      Yes                                 +---------+---------------+---------+-----------+----------+--------------+ SFJ      Full           Yes      Yes                                 +---------+---------------+---------+-----------+----------+--------------+ FV Prox  Full           Yes      Yes                                 +---------+---------------+---------+-----------+----------+--------------+ FV Mid   Full           Yes      Yes                                 +---------+---------------+---------+-----------+----------+--------------+ FV DistalFull           Yes      Yes                                 +---------+---------------+---------+-----------+----------+--------------+ PFV      Full                    Yes                                 +---------+---------------+---------+-----------+----------+--------------+ POP      Full           Yes      Yes                                 +---------+---------------+---------+-----------+----------+--------------+ PTV      Full                    Yes                                 +---------+---------------+---------+-----------+----------+--------------+ PERO     Full                    Yes                                 +---------+---------------+---------+-----------+----------+--------------+ GSV      Full                    Yes                                  +---------+---------------+---------+-----------+----------+--------------+ SSV      Full                                                        +---------+---------------+---------+-----------+----------+--------------+  Summary: BILATERAL: - No evidence of deep vein thrombosis seen in the lower extremities, bilaterally. - No evidence of superficial venous thrombosis in the lower extremities, bilaterally. -No evidence of popliteal cyst, bilaterally.   *See table(s) above for measurements and observations. Electronically signed by Deitra Mayo MD on 08/01/2019 at 12:45:24 PM.    Final     Assessment & Plan:   Problem List Items Addressed This Visit     DM (diabetes mellitus), type 2 with renal complications ([place]) - Primary    Cont on Metformin 500 mg bid, Repaginate tid.      Relevant Orders   Comprehensive metabolic panel   Hemoglobin A1c   CBC with Differential/Platelet   Lipid panel   Essential hypertension    Cont on Telmisartan-HCT, Amlodipine  5 mg/d      Relevant Orders   Comprehensive metabolic panel   Hemoglobin A1c   CBC with Differential/Platelet   Lipid panel   LOW BACK PAIN    Stretching at home      Relevant Medications   traMADol (ULTRAM) 50 MG tablet   Other Relevant Orders   Comprehensive metabolic panel   Hemoglobin A1c   CBC with Differential/Platelet   Lipid panel   Lumbar foraminal stenosis    On Celebrex prn 1/week or so      Wrist pain, left    OA Blue-Emu cream was recommended to use 2-3 times a day Celebrex 1/week Steroid inj was offered  Tramadol prn  Potential benefits of a short or long term opioids use as well as potential risks (i.e. addiction risk, apnea etc) and complications (i.e. Somnolence, constipation and others) were explained to the patient and were aknowledged.       Other Visit Diagnoses     Needs flu shot       Relevant Orders   Flu Vaccine QUAD High Dose(Fluad) (Completed)         Meds ordered  this encounter  Medications   traMADol (ULTRAM) 50 MG tablet    Sig: Take 1 tablet (50 mg total) by mouth every 6 (six) hours as needed for up to 5 days.    Dispense:  20 tablet    Refill:  2      Follow-up: Return in about 3 months (around 01/26/2022) for a follow-up visit.  Walker Kehr, MD

## 2021-10-26 NOTE — Assessment & Plan Note (Signed)
Stretching at home

## 2021-10-26 NOTE — Assessment & Plan Note (Addendum)
OA Blue-Emu cream was recommended to use 2-3 times a day Celebrex 1/week Steroid inj was offered  Tramadol prn  Potential benefits of a short or long term opioids use as well as potential risks (i.e. addiction risk, apnea etc) and complications (i.e. Somnolence, constipation and others) were explained to the patient and were aknowledged.

## 2021-10-26 NOTE — Assessment & Plan Note (Signed)
Cont on Telmisartan-HCT, Amlodipine  5 mg/d

## 2021-10-26 NOTE — Assessment & Plan Note (Signed)
Cont on Metformin 500 mg bid, Repaginate tid.

## 2021-11-11 DIAGNOSIS — R2689 Other abnormalities of gait and mobility: Secondary | ICD-10-CM | POA: Diagnosis not present

## 2021-11-11 DIAGNOSIS — Z9889 Other specified postprocedural states: Secondary | ICD-10-CM | POA: Diagnosis not present

## 2021-11-11 DIAGNOSIS — M5416 Radiculopathy, lumbar region: Secondary | ICD-10-CM | POA: Diagnosis not present

## 2022-01-05 DIAGNOSIS — R310 Gross hematuria: Secondary | ICD-10-CM | POA: Diagnosis not present

## 2022-01-05 DIAGNOSIS — C672 Malignant neoplasm of lateral wall of bladder: Secondary | ICD-10-CM | POA: Diagnosis not present

## 2022-01-07 DIAGNOSIS — N3289 Other specified disorders of bladder: Secondary | ICD-10-CM | POA: Diagnosis not present

## 2022-01-07 DIAGNOSIS — R310 Gross hematuria: Secondary | ICD-10-CM | POA: Diagnosis not present

## 2022-01-07 DIAGNOSIS — K8020 Calculus of gallbladder without cholecystitis without obstruction: Secondary | ICD-10-CM | POA: Diagnosis not present

## 2022-01-07 DIAGNOSIS — C672 Malignant neoplasm of lateral wall of bladder: Secondary | ICD-10-CM | POA: Diagnosis not present

## 2022-01-13 ENCOUNTER — Other Ambulatory Visit: Payer: Self-pay | Admitting: Ophthalmology

## 2022-01-13 DIAGNOSIS — R6889 Other general symptoms and signs: Secondary | ICD-10-CM | POA: Diagnosis not present

## 2022-01-13 DIAGNOSIS — C678 Malignant neoplasm of overlapping sites of bladder: Secondary | ICD-10-CM | POA: Diagnosis not present

## 2022-01-13 DIAGNOSIS — Z743 Need for continuous supervision: Secondary | ICD-10-CM | POA: Diagnosis not present

## 2022-01-13 DIAGNOSIS — R58 Hemorrhage, not elsewhere classified: Secondary | ICD-10-CM | POA: Diagnosis not present

## 2022-01-13 DIAGNOSIS — C679 Malignant neoplasm of bladder, unspecified: Secondary | ICD-10-CM | POA: Diagnosis not present

## 2022-01-14 ENCOUNTER — Inpatient Hospital Stay (HOSPITAL_BASED_OUTPATIENT_CLINIC_OR_DEPARTMENT_OTHER): Payer: Medicare Other | Admitting: Anesthesiology

## 2022-01-14 ENCOUNTER — Inpatient Hospital Stay (HOSPITAL_COMMUNITY): Payer: Medicare Other | Admitting: Anesthesiology

## 2022-01-14 ENCOUNTER — Encounter (HOSPITAL_COMMUNITY): Payer: Self-pay

## 2022-01-14 ENCOUNTER — Other Ambulatory Visit: Payer: Self-pay | Admitting: Urology

## 2022-01-14 ENCOUNTER — Encounter (HOSPITAL_COMMUNITY)
Admission: AD | Disposition: A | Discharge status: Home / Self Care | Payer: Self-pay | Source: Ambulatory Visit | Attending: Urology

## 2022-01-14 ENCOUNTER — Emergency Department (HOSPITAL_COMMUNITY)
Admission: EM | Admit: 2022-01-14 | Discharge: 2022-01-14 | Disposition: A | Discharge status: Home / Self Care | Payer: Medicare Other | Source: Home / Self Care | Attending: Emergency Medicine | Admitting: Emergency Medicine

## 2022-01-14 ENCOUNTER — Encounter (HOSPITAL_COMMUNITY): Payer: Self-pay | Admitting: Urology

## 2022-01-14 ENCOUNTER — Observation Stay (HOSPITAL_COMMUNITY)
Admission: AD | Admit: 2022-01-14 | Discharge: 2022-01-15 | Disposition: A | Discharge status: Home / Self Care | Payer: Medicare Other | Source: Ambulatory Visit | Attending: Urology | Admitting: Urology

## 2022-01-14 ENCOUNTER — Other Ambulatory Visit: Payer: Self-pay

## 2022-01-14 DIAGNOSIS — I2510 Atherosclerotic heart disease of native coronary artery without angina pectoris: Secondary | ICD-10-CM

## 2022-01-14 DIAGNOSIS — Z8551 Personal history of malignant neoplasm of bladder: Secondary | ICD-10-CM | POA: Insufficient documentation

## 2022-01-14 DIAGNOSIS — I129 Hypertensive chronic kidney disease with stage 1 through stage 4 chronic kidney disease, or unspecified chronic kidney disease: Secondary | ICD-10-CM | POA: Insufficient documentation

## 2022-01-14 DIAGNOSIS — R338 Other retention of urine: Secondary | ICD-10-CM | POA: Insufficient documentation

## 2022-01-14 DIAGNOSIS — Z7984 Long term (current) use of oral hypoglycemic drugs: Secondary | ICD-10-CM | POA: Insufficient documentation

## 2022-01-14 DIAGNOSIS — N183 Chronic kidney disease, stage 3 unspecified: Secondary | ICD-10-CM | POA: Insufficient documentation

## 2022-01-14 DIAGNOSIS — R339 Retention of urine, unspecified: Secondary | ICD-10-CM | POA: Insufficient documentation

## 2022-01-14 DIAGNOSIS — Z7982 Long term (current) use of aspirin: Secondary | ICD-10-CM | POA: Diagnosis not present

## 2022-01-14 DIAGNOSIS — I10 Essential (primary) hypertension: Secondary | ICD-10-CM | POA: Diagnosis not present

## 2022-01-14 DIAGNOSIS — R319 Hematuria, unspecified: Secondary | ICD-10-CM | POA: Insufficient documentation

## 2022-01-14 DIAGNOSIS — Z951 Presence of aortocoronary bypass graft: Secondary | ICD-10-CM | POA: Insufficient documentation

## 2022-01-14 DIAGNOSIS — R1084 Generalized abdominal pain: Secondary | ICD-10-CM | POA: Insufficient documentation

## 2022-01-14 DIAGNOSIS — Z79899 Other long term (current) drug therapy: Secondary | ICD-10-CM | POA: Insufficient documentation

## 2022-01-14 DIAGNOSIS — Z87891 Personal history of nicotine dependence: Secondary | ICD-10-CM | POA: Insufficient documentation

## 2022-01-14 DIAGNOSIS — E1122 Type 2 diabetes mellitus with diabetic chronic kidney disease: Secondary | ICD-10-CM | POA: Insufficient documentation

## 2022-01-14 DIAGNOSIS — R310 Gross hematuria: Principal | ICD-10-CM | POA: Insufficient documentation

## 2022-01-14 HISTORY — PX: CYSTOSCOPY WITH FULGERATION: SHX6638

## 2022-01-14 LAB — COMPREHENSIVE METABOLIC PANEL
ALT: 17 U/L (ref 0–44)
AST: 23 U/L (ref 15–41)
Albumin: 3.5 g/dL (ref 3.5–5.0)
Alkaline Phosphatase: 63 U/L (ref 38–126)
Anion gap: 11 (ref 5–15)
BUN: 25 mg/dL — ABNORMAL HIGH (ref 8–23)
CO2: 21 mmol/L — ABNORMAL LOW (ref 22–32)
Calcium: 8.8 mg/dL — ABNORMAL LOW (ref 8.9–10.3)
Chloride: 105 mmol/L (ref 98–111)
Creatinine, Ser: 1.4 mg/dL — ABNORMAL HIGH (ref 0.61–1.24)
GFR, Estimated: 50 mL/min — ABNORMAL LOW (ref >60)
Glucose, Bld: 220 mg/dL — ABNORMAL HIGH (ref 70–99)
Potassium: 3.5 mmol/L (ref 3.5–5.1)
Sodium: 137 mmol/L (ref 135–145)
Total Bilirubin: 0.5 mg/dL (ref 0.3–1.2)
Total Protein: 5.9 g/dL — ABNORMAL LOW (ref 6.5–8.1)

## 2022-01-14 LAB — CBC
HCT: 35.3 % — ABNORMAL LOW (ref 39.0–52.0)
Hemoglobin: 12.2 g/dL — ABNORMAL LOW (ref 13.0–17.0)
MCH: 30.2 pg (ref 26.0–34.0)
MCHC: 34.6 g/dL (ref 30.0–36.0)
MCV: 87.4 fL (ref 80.0–100.0)
Platelets: 217 10*3/uL (ref 150–400)
RBC: 4.04 MIL/uL — ABNORMAL LOW (ref 4.22–5.81)
RDW: 12.5 % (ref 11.5–15.5)
WBC: 19.2 10*3/uL — ABNORMAL HIGH (ref 4.0–10.5)
nRBC: 0 % (ref 0.0–0.2)

## 2022-01-14 LAB — PROTIME-INR
INR: 1.1 (ref 0.8–1.2)
Prothrombin Time: 14.2 seconds (ref 11.4–15.2)

## 2022-01-14 LAB — URINALYSIS, ROUTINE W REFLEX MICROSCOPIC
Specific Gravity, Urine: 1.03 (ref 1.005–1.030)
pH: 5 (ref 5.0–8.0)

## 2022-01-14 LAB — URINALYSIS, MICROSCOPIC (REFLEX): Squamous Epithelial / HPF: NONE SEEN /HPF (ref 0–5)

## 2022-01-14 LAB — GLUCOSE, CAPILLARY: Glucose-Capillary: 130 mg/dL — ABNORMAL HIGH (ref 70–99)

## 2022-01-14 SURGERY — CYSTOSCOPY, WITH BLADDER FULGURATION
Anesthesia: General

## 2022-01-14 MED ORDER — OXYCODONE HCL 5 MG PO TABS
ORAL_TABLET | ORAL | Status: AC
  Filled 2022-01-14: qty 1

## 2022-01-14 MED ORDER — ONDANSETRON HCL 4 MG/2ML IJ SOLN
INTRAMUSCULAR | Status: DC | PRN
  Administered 2022-01-14: 4 mg via INTRAVENOUS

## 2022-01-14 MED ORDER — PRAVASTATIN SODIUM 20 MG PO TABS
40.0000 mg | ORAL_TABLET | Freq: Every day | ORAL | Status: DC
  Administered 2022-01-14: 40 mg via ORAL
  Filled 2022-01-14: qty 2

## 2022-01-14 MED ORDER — LIDOCAINE HCL URETHRAL/MUCOSAL 2 % EX GEL
1.0000 | Freq: Once | CUTANEOUS | Status: AC
  Administered 2022-01-14: 1 via URETHRAL
  Filled 2022-01-14: qty 11

## 2022-01-14 MED ORDER — LACTATED RINGERS IV SOLN: INTRAVENOUS | Status: DC

## 2022-01-14 MED ORDER — CEFAZOLIN SODIUM-DEXTROSE 2-4 GM/100ML-% IV SOLN
2.0000 g | INTRAVENOUS | Status: AC
  Administered 2022-01-14: 2 g via INTRAVENOUS
  Filled 2022-01-14: qty 100

## 2022-01-14 MED ORDER — SODIUM CHLORIDE 0.9 % IR SOLN: 3000.0000 mL | Status: DC

## 2022-01-14 MED ORDER — ONDANSETRON HCL 4 MG/2ML IJ SOLN: 4.0000 mg | Freq: Once | INTRAMUSCULAR | Status: DC | PRN

## 2022-01-14 MED ORDER — LIDOCAINE 2% (20 MG/ML) 5 ML SYRINGE
INTRAMUSCULAR | Status: DC | PRN
  Administered 2022-01-14: 80 mg via INTRAVENOUS

## 2022-01-14 MED ORDER — ONDANSETRON HCL 4 MG/2ML IJ SOLN: 4.0000 mg | INTRAMUSCULAR | Status: DC | PRN

## 2022-01-14 MED ORDER — OXYCODONE HCL 5 MG PO TABS
5.0000 mg | ORAL_TABLET | Freq: Once | ORAL | Status: AC | PRN
  Administered 2022-01-14: 5 mg via ORAL

## 2022-01-14 MED ORDER — ROCURONIUM BROMIDE 10 MG/ML (PF) SYRINGE
PREFILLED_SYRINGE | INTRAVENOUS | Status: AC
  Filled 2022-01-14: qty 10

## 2022-01-14 MED ORDER — OXYCODONE HCL 5 MG/5ML PO SOLN: 5.0000 mg | Freq: Once | ORAL | Status: AC | PRN

## 2022-01-14 MED ORDER — FENTANYL CITRATE (PF) 100 MCG/2ML IJ SOLN
INTRAMUSCULAR | Status: AC
  Filled 2022-01-14: qty 2

## 2022-01-14 MED ORDER — TRIPLE ANTIBIOTIC 3.5-400-5000 EX OINT: 1.0000 | TOPICAL_OINTMENT | Freq: Three times a day (TID) | CUTANEOUS | Status: DC | PRN

## 2022-01-14 MED ORDER — FENTANYL CITRATE (PF) 100 MCG/2ML IJ SOLN
INTRAMUSCULAR | Status: DC | PRN
  Administered 2022-01-14: 50 ug via INTRAVENOUS

## 2022-01-14 MED ORDER — EPHEDRINE SULFATE-NACL 50-0.9 MG/10ML-% IV SOSY
PREFILLED_SYRINGE | INTRAVENOUS | Status: DC | PRN
  Administered 2022-01-14: 15 mg via INTRAVENOUS

## 2022-01-14 MED ORDER — SENNOSIDES-DOCUSATE SODIUM 8.6-50 MG PO TABS
2.0000 | ORAL_TABLET | Freq: Every day | ORAL | Status: DC
  Administered 2022-01-14: 2 via ORAL
  Filled 2022-01-14: qty 2

## 2022-01-14 MED ORDER — HYDROMORPHONE HCL 1 MG/ML IJ SOLN: 0.5000 mg | INTRAMUSCULAR | Status: DC | PRN

## 2022-01-14 MED ORDER — PHENYLEPHRINE 80 MCG/ML (10ML) SYRINGE FOR IV PUSH (FOR BLOOD PRESSURE SUPPORT)
PREFILLED_SYRINGE | INTRAVENOUS | Status: DC | PRN
  Administered 2022-01-14: 80 ug via INTRAVENOUS

## 2022-01-14 MED ORDER — ACETAMINOPHEN 325 MG PO TABS: 325.0000 mg | ORAL_TABLET | ORAL | Status: DC | PRN

## 2022-01-14 MED ORDER — HYDROCHLOROTHIAZIDE 25 MG PO TABS
25.0000 mg | ORAL_TABLET | Freq: Every day | ORAL | Status: DC
  Administered 2022-01-15: 25 mg via ORAL
  Filled 2022-01-14: qty 1

## 2022-01-14 MED ORDER — TELMISARTAN-HCTZ 80-25 MG PO TABS: 1.0000 | ORAL_TABLET | Freq: Every day | ORAL | Status: DC

## 2022-01-14 MED ORDER — 0.9 % SODIUM CHLORIDE (POUR BTL) OPTIME
TOPICAL | Status: DC | PRN
  Administered 2022-01-14: 1000 mL

## 2022-01-14 MED ORDER — IRBESARTAN 150 MG PO TABS
150.0000 mg | ORAL_TABLET | Freq: Every day | ORAL | Status: DC
  Administered 2022-01-15: 150 mg via ORAL
  Filled 2022-01-14: qty 1

## 2022-01-14 MED ORDER — AMLODIPINE BESYLATE 5 MG PO TABS
5.0000 mg | ORAL_TABLET | Freq: Every day | ORAL | Status: DC
  Administered 2022-01-15: 5 mg via ORAL
  Filled 2022-01-14: qty 1

## 2022-01-14 MED ORDER — CEFAZOLIN SODIUM-DEXTROSE 2-4 GM/100ML-% IV SOLN
2.0000 g | Freq: Three times a day (TID) | INTRAVENOUS | Status: AC
  Administered 2022-01-15 (×2): 2 g via INTRAVENOUS
  Filled 2022-01-14 (×2): qty 100

## 2022-01-14 MED ORDER — PROPOFOL 10 MG/ML IV BOLUS
INTRAVENOUS | Status: AC
  Filled 2022-01-14: qty 20

## 2022-01-14 MED ORDER — DIPHENHYDRAMINE HCL 12.5 MG/5ML PO ELIX: 12.5000 mg | ORAL_SOLUTION | Freq: Four times a day (QID) | ORAL | Status: DC | PRN

## 2022-01-14 MED ORDER — FENTANYL CITRATE PF 50 MCG/ML IJ SOSY
PREFILLED_SYRINGE | INTRAMUSCULAR | Status: AC
  Filled 2022-01-14: qty 1

## 2022-01-14 MED ORDER — ONDANSETRON HCL 4 MG/2ML IJ SOLN
INTRAMUSCULAR | Status: AC
  Filled 2022-01-14: qty 2

## 2022-01-14 MED ORDER — DIPHENHYDRAMINE HCL 50 MG/ML IJ SOLN: 12.5000 mg | Freq: Four times a day (QID) | INTRAMUSCULAR | Status: DC | PRN

## 2022-01-14 MED ORDER — HYDROMORPHONE HCL 1 MG/ML IJ SOLN
1.0000 mg | Freq: Once | INTRAMUSCULAR | Status: AC
  Administered 2022-01-14: 1 mg via INTRAVENOUS
  Filled 2022-01-14: qty 1

## 2022-01-14 MED ORDER — CHLORHEXIDINE GLUCONATE 0.12 % MT SOLN: 15.0000 mL | Freq: Once | OROMUCOSAL | Status: DC

## 2022-01-14 MED ORDER — SODIUM CHLORIDE 0.9 % IV SOLN: INTRAVENOUS | Status: DC

## 2022-01-14 MED ORDER — DEXAMETHASONE SODIUM PHOSPHATE 10 MG/ML IJ SOLN
INTRAMUSCULAR | Status: DC | PRN
  Administered 2022-01-14: 10 mg via INTRAVENOUS

## 2022-01-14 MED ORDER — ACETAMINOPHEN 325 MG PO TABS: 650.0000 mg | ORAL_TABLET | ORAL | Status: DC | PRN

## 2022-01-14 MED ORDER — METFORMIN HCL ER 500 MG PO TB24
500.0000 mg | ORAL_TABLET | Freq: Two times a day (BID) | ORAL | Status: DC
  Administered 2022-01-15: 500 mg via ORAL
  Filled 2022-01-14 (×2): qty 1

## 2022-01-14 MED ORDER — ACETAMINOPHEN 160 MG/5ML PO SOLN: 325.0000 mg | ORAL | Status: DC | PRN

## 2022-01-14 MED ORDER — OXYCODONE HCL 5 MG PO TABS
5.0000 mg | ORAL_TABLET | ORAL | Status: DC | PRN
  Administered 2022-01-15 (×2): 5 mg via ORAL
  Filled 2022-01-14 (×2): qty 1

## 2022-01-14 MED ORDER — FENTANYL CITRATE PF 50 MCG/ML IJ SOSY
25.0000 ug | PREFILLED_SYRINGE | INTRAMUSCULAR | Status: DC | PRN
  Administered 2022-01-14 (×2): 50 ug via INTRAVENOUS

## 2022-01-14 MED ORDER — SODIUM CHLORIDE 0.9 % IR SOLN
Status: DC | PRN
  Administered 2022-01-14: 18000 mL via INTRAVESICAL

## 2022-01-14 MED ORDER — PROPOFOL 10 MG/ML IV BOLUS
INTRAVENOUS | Status: DC | PRN
  Administered 2022-01-14: 140 mg via INTRAVENOUS

## 2022-01-14 MED ORDER — OXYBUTYNIN CHLORIDE 5 MG PO TABS: 5.0000 mg | ORAL_TABLET | Freq: Three times a day (TID) | ORAL | Status: DC | PRN

## 2022-01-14 MED ORDER — ORAL CARE MOUTH RINSE: 15.0000 mL | Freq: Once | OROMUCOSAL | Status: DC

## 2022-01-14 MED ORDER — DEXAMETHASONE SODIUM PHOSPHATE 10 MG/ML IJ SOLN
INTRAMUSCULAR | Status: AC
  Filled 2022-01-14: qty 1

## 2022-01-14 MED ORDER — ZOLPIDEM TARTRATE 5 MG PO TABS: 5.0000 mg | ORAL_TABLET | Freq: Every evening | ORAL | Status: DC | PRN

## 2022-01-14 SURGICAL SUPPLY — 19 items
BAG URINE DRAIN 2000ML AR STRL (UROLOGICAL SUPPLIES) IMPLANT
BAG URO CATCHER STRL LF (MISCELLANEOUS) ×1 IMPLANT
CATH FOLEY 2WAY SLVR  5CC 18FR (CATHETERS)
CATH FOLEY 2WAY SLVR 5CC 18FR (CATHETERS) IMPLANT
CATH FOLEY 3WAY 30CC 22FR (CATHETERS) IMPLANT
DRAPE FOOT SWITCH (DRAPES) ×1 IMPLANT
ELECT REM PT RETURN 15FT ADLT (MISCELLANEOUS) ×1 IMPLANT
GLOVE BIO SURGEON STRL SZ7.5 (GLOVE) ×1 IMPLANT
GOWN STRL REUS W/ TWL XL LVL3 (GOWN DISPOSABLE) ×1 IMPLANT
GOWN STRL REUS W/TWL XL LVL3 (GOWN DISPOSABLE) ×1
KIT TURNOVER KIT A (KITS) IMPLANT
LOOP CUT BIPOLAR 24F LRG (ELECTROSURGICAL) IMPLANT
MANIFOLD NEPTUNE II (INSTRUMENTS) ×1 IMPLANT
PACK CYSTO (CUSTOM PROCEDURE TRAY) ×1 IMPLANT
PLUG CATH AND CAP STER (CATHETERS) IMPLANT
SYR TOOMEY IRRIG 70ML (MISCELLANEOUS)
SYRINGE TOOMEY IRRIG 70ML (MISCELLANEOUS) IMPLANT
TUBING CONNECTING 10 (TUBING) ×1 IMPLANT
TUBING UROLOGY SET (TUBING) ×1 IMPLANT

## 2022-01-14 NOTE — H&P (Signed)
H&P  Chief Complaint: Gross hematuria  History of Present Illness: 87 year old male status post TURBT yesterday.  Went home with Foley catheter.  Developed clot retention and went to the emergency department and Foley catheter exchanged.  Reevaluated in the office today and had persistent gross hematuria.  Elected to proceed with cystoscopy with clot evacuation and fulguration.  Past Medical History:  Diagnosis Date   Benign localized prostatic hyperplasia with lower urinary tract symptoms (LUTS)    Bladder cancer [facility]) dx 05/ 2017--  urologist- dr Pilar Jarvis   TCC , Ta of bladder  s/p TURBT 06-02-2015   CAD (coronary artery disease)    no cardiologist--  followed by pcp,  dr plontnikov   Chronic low back pain with sciatica    CKD (chronic kidney disease), stage III ([place])    Diabetes mellitus, type 2 ([place])    ED (erectile dysfunction)    Full dentures    History of ETT    08-09-2014   fair exercise tolerance, no chest pain, normal BP response, no ST changes (baseline RBBB);  negative adequate ETT   Hyperlipidemia    Hypertension    Hypogonadism male    OA (osteoarthritis)    Right bundle branch block (RBBB)    S/P CABG x 1    1998   Sigmoid diverticulosis    Ventral hernia    midline   Wears glasses    Past Surgical History:  Procedure Laterality Date   COLONOSCOPY WITH PROPOFOL  02-01-2012   CORONARY ARTERY BYPASS GRAFT  1998  [facility]   x1  vessel    CYSTOSCOPY WITH BIOPSY N/A 10/08/2015   Procedure: CYSTOSCOPY WITH BLADDER BIOPSY;  Surgeon: Nickie Retort, MD;  Location: [facility];  Service: Urology;  Laterality: N/A;   CYSTOSCOPY WITH FULGERATION N/A 10/08/2015   Procedure: CYSTOSCOPY WITH FULGERATION;  Surgeon: Nickie Retort, MD;  Location: [facility];  Service: Urology;  Laterality: N/A;   TONSILLECTOMY  age 89   TRANSURETHRAL RESECTION OF BLADDER TUMOR WITH GYRUS (TURBT-GYRUS) N/A 06/02/2015   Procedure: TRANSURETHRAL  RESECTION OF BLADDER TUMOR WITH GYRUS (TURBT-GYRUS);  Surgeon: Nickie Retort, MD;  Location: [facility];  Service: Urology;  Laterality: N/A;    Home Medications:  Medications Prior to Admission  Medication Sig Dispense Refill Last Dose   amLODipine (NORVASC) 5 MG tablet TAKE 1 TABLET BY MOUTH  DAILY (Patient taking differently: Take 5 mg by mouth daily.) 90 tablet 3 01/14/2022 at am   aspirin 81 MG tablet Take 2 tablets (162 mg total) by mouth daily. (Patient taking differently: Take 81 mg by mouth in the morning and at bedtime.) 100 tablet 3 01/14/2022 at 0900   Cholecalciferol (VITAMIN D3) 1000 units CAPS Take 1,000 Units by mouth daily with breakfast.   01/14/2022 at am   Cinnamon 500 MG capsule Take 500 mg by mouth 3 (three) times a week.   Past Week   HYDROcodone-acetaminophen (NORCO/VICODIN) 5-325 MG tablet Take 1 tablet by mouth every 6 (six) hours as needed for moderate pain or severe pain.   01/14/2022 at 1000   metFORMIN (GLUCOPHAGE-XR) 500 MG 24 hr tablet TAKE 1 TABLET BY MOUTH  TWICE DAILY (Patient taking differently: Take 500 mg by mouth 2 (two) times daily with a meal.) 200 tablet 2 01/14/2022 at am   pravastatin (PRAVACHOL) 40 MG tablet TAKE 1 TABLET BY MOUTH DAILY (Patient taking differently: Take 40 mg by mouth at bedtime.) 100  tablet 2 01/13/2022 at pm   telmisartan-hydrochlorothiazide (MICARDIS HCT) 80-25 MG tablet Take 1 tablet by mouth daily. 90 tablet 3 01/14/2022 at am   traMADol (ULTRAM) 50 MG tablet Take 50 mg by mouth every 6 (six) hours as needed (for pain).   01/13/2022   TYLENOL 500 MG tablet Take 500-1,000 mg by mouth every 8 (eight) hours as needed (for pain).   01/13/2022   cholecalciferol (VITAMIN D) 25 MCG (1000 UT) tablet Take 1 tablet (1,000 Units total) by mouth every morning. Follow-up due in April must see provider for refills (Patient not taking: Reported on 01/14/2022) 90 tablet 0 Not Taking   methocarbamol (ROBAXIN) 500 MG tablet Take 1 tablet (500 mg  total) by mouth every 6 (six) hours as needed for muscle spasms. (Patient not taking: Reported on 01/14/2022) 60 tablet 1 Not Taking   oxyCODONE (OXY IR/ROXICODONE) 5 MG immediate release tablet Take 1 tablet (5 mg total) by mouth every 3 (three) hours as needed for moderate pain ((score 4 to 6)). (Patient not taking: Reported on 01/14/2022) 30 tablet 0 Not Taking   Allergies:  Allergies  Allergen Reactions   Coreg [Carvedilol] Other (See Comments)    weakness   Lovastatin     Leg weakness   Morphine Sulfate Other (See Comments)    Per the pt: "After taking morphine, within 30 mins, I was soaking wet. I did not like the way it made me feel."    Family History  Problem Relation Age of Onset   Arthritis Mother 19       RA   Alcohol abuse Father 71       exposure   Diabetes Brother    Kidney disease Brother    Heart disease Brother    Hypertension Other    Colon cancer Neg Hx    Esophageal cancer Neg Hx    Rectal cancer Neg Hx    Stomach cancer Neg Hx    Social History:  reports that he quit smoking about 51 years ago. His smoking use included cigarettes. His smokeless tobacco use includes snuff. He reports that he does not drink alcohol and does not use drugs.  ROS: A complete review of systems was performed.  All systems are negative except for pertinent findings as noted. ROS   Physical Exam:  Vital signs in last 24 hours: Temp:  [97.8 F (36.6 C)] 97.8 F (36.6 C) (01/04 0447) Pulse Rate:  [75-111] 75 (01/04 0530) Resp:  [17-18] 17 (01/04 0530) BP: (103-204)/(57-103) 110/62 (01/04 0530) SpO2:  [88 %-98 %] 92 % (01/04 0530) Weight:  [83.5 kg] 83.5 kg (01/04 0040) General:  Alert and oriented, No acute distress HEENT: Normocephalic, atraumatic Neck: No JVD or lymphadenopathy Cardiovascular: Regular rate and rhythm Lungs: Regular rate and effort Abdomen: Soft, nontender, nondistended, no abdominal masses Back: No CVA tenderness Extremities: No edema Genitourinary:  Three-way Foley catheter draining light red on CBI Neurologic: Grossly intact  Laboratory Data:  Results for orders placed or performed during the hospital encounter of 01/14/22 (from the past 24 hour(s))  Glucose, capillary     Status: Abnormal   Collection Time: 01/14/22  5:06 PM  Result Value Ref Range   Glucose-Capillary 130 (H) 70 - 99 mg/dL   Comment 1 Notify RN    No results found for this or any previous visit (from the past 240 hour(s)). Creatinine: Recent Labs    01/14/22 0258  CREATININE 1.40*    Impression/Assessment:  Gross hematuria  Plan:  Proceed with cystoscopy with clot evacuation and fulguration.  Marton Redwood, III 01/14/2022, 5:32 PM

## 2022-01-14 NOTE — ED Notes (Signed)
Called lab to add culture

## 2022-01-14 NOTE — Op Note (Signed)
Operative Note  Preoperative diagnosis:  1.  Gross hematuria with clot retention  Postoperative diagnosis: 1.  Same  Procedure(s): 1.  Cystoscopy with clot evacuation and fulguration  Surgeon: Link Snuffer, MD  Assistants: None  Anesthesia: General  Complications: None immediate  EBL: Minimal  Specimens: 1.  None  Drains/Catheters: 1.  22 French three-way Foley catheter  Intraoperative findings: 1.  Normal urethra 2.  Moderately obstructing prostate 3.  Bladder had a large amount of clot.  It was adherent and organized.  All clot evacuated.  Clot was adherent to the prior TUR site on the right lateral wall.  There is no evidence of intraperitoneal bladder perforation.  There was very small amount of active bleeding that was fulgurated.  Resection bed was again fulgurated.  Bilateral ureteral orifices were identified and were away from fulguration though it was close to the right ureteral orifice.  Indication: 87 year old male status post TURBT yesterday presented and clot retention.  He presents for previously mentioned operation.  Description of procedure:  The patient was identified and consent was obtained.  The patient was taken to the operating room and placed in the supine position.  The patient was placed under general anesthesia.  Perioperative antibiotics were administered.  The patient was placed in dorsal lithotomy.  Patient was prepped and draped in a standard sterile fashion and a timeout was performed.  A 26 French resectoscope with a visual obturator in place was advanced into the urethra and into the bladder.  Large amount of clot was evacuated.  This was performed multiple times in order to get the clot out.  Some of the clot was quite adherent and had to be scraped off with the resectoscope loop.  Once all of the clot was evacuated, there was a small amount of oozing from the resection site that was fulgurated.  I fulgurated a large amount of the resection site.   There was no active bleeding noted at the conclusion of the case and all clot had been evacuated.  I did not note any bladder perforation with the bladder full and the bladder completely held all of the water.  I withdrew the scope and placed a 22 French three-way catheter and capped the third port.  This include the operation.  Patient tolerated the procedure well was stable postoperativly.  Plan: I will keep him overnight under observation and hopefully discharge tomorrow as long as urine remains clear

## 2022-01-14 NOTE — Anesthesia Procedure Notes (Signed)
Procedure Name: LMA Insertion Date/Time: 01/14/2022 6:41 PM  Performed by: Gean Maidens, CRNAPre-anesthesia Checklist: Patient identified, Emergency Drugs available, Suction available, Patient being monitored and Timeout performed Patient Re-evaluated:Patient Re-evaluated prior to induction Oxygen Delivery Method: Circle system utilized Preoxygenation: Pre-oxygenation with 100% oxygen Induction Type: IV induction Ventilation: Mask ventilation without difficulty LMA: LMA inserted LMA Size: 4.0 Number of attempts: 1 Placement Confirmation: positive ETCO2 and breath sounds checked- equal and bilateral Tube secured with: Tape Dental Injury: Teeth and Oropharynx as per pre-operative assessment

## 2022-01-14 NOTE — Discharge Instructions (Signed)
You were evaluated in the Emergency Department and after careful evaluation, we did not find any emergent condition requiring admission or further testing in the hospital.  Your exam/testing today is overall reassuring.  We replaced your catheter because of blood clots and retaining of urine here in the emergency department.  Recommend close follow-up with your urologist.  Please return to the Emergency Department if you experience any worsening of your condition.   Thank you for allowing [place] to be a part of your care.

## 2022-01-14 NOTE — Discharge Instructions (Signed)

## 2022-01-14 NOTE — ED Provider Notes (Signed)
[facility] Emergency Department Provider Note MRN:  [id number]  Arrival date & time: 01/14/22     Chief Complaint   bleeding   History of Present Illness   Jeff Burgess is a 87 y.o. year-old male with a history of bladder cancer, CAD, CKD presenting to the ED with chief complaint of bleeding.  Patient explains that he had surgery or procedure on his bladder earlier today.  Recently diagnosed with bladder cancer.  He was feeling well and went to bed.  Then started having some blood in urine and worsening abdominal pain and pressure.  Review of Systems  A thorough review of systems was obtained and all systems are negative except as noted in the HPI and PMH.   Patient's Health History    Past Medical History:  Diagnosis Date   Benign localized prostatic hyperplasia with lower urinary tract symptoms (LUTS)    Bladder cancer [facility]) dx 05/ 2017--  urologist- dr Pilar Jarvis   TCC , Ta of bladder  s/p TURBT 06-02-2015   CAD (coronary artery disease)    no cardiologist--  followed by pcp,  dr plontnikov   Chronic low back pain with sciatica    CKD (chronic kidney disease), stage III ([place])    Diabetes mellitus, type 2 ([place])    ED (erectile dysfunction)    Full dentures    History of ETT    08-09-2014   fair exercise tolerance, no chest pain, normal BP response, no ST changes (baseline RBBB);  negative adequate ETT   Hyperlipidemia    Hypertension    Hypogonadism male    OA (osteoarthritis)    Right bundle branch block (RBBB)    S/P CABG x 1    1998   Sigmoid diverticulosis    Ventral hernia    midline   Wears glasses     Past Surgical History:  Procedure Laterality Date   COLONOSCOPY WITH PROPOFOL  02-01-2012   CORONARY ARTERY BYPASS GRAFT  1998  at [facility]   x1  vessel    CYSTOSCOPY WITH BIOPSY N/A 10/08/2015   Procedure: CYSTOSCOPY WITH BLADDER BIOPSY;  Surgeon: Nickie Retort, MD;  Location: [facility];  Service:  Urology;  Laterality: N/A;   CYSTOSCOPY WITH FULGERATION N/A 10/08/2015   Procedure: CYSTOSCOPY WITH FULGERATION;  Surgeon: Nickie Retort, MD;  Location: [facility];  Service: Urology;  Laterality: N/A;   TONSILLECTOMY  age 51   TRANSURETHRAL RESECTION OF BLADDER TUMOR WITH GYRUS (TURBT-GYRUS) N/A 06/02/2015   Procedure: TRANSURETHRAL RESECTION OF BLADDER TUMOR WITH GYRUS (TURBT-GYRUS);  Surgeon: Nickie Retort, MD;  Location: [facility];  Service: Urology;  Laterality: N/A;    Family History  Problem Relation Age of Onset   Arthritis Mother 22       RA   Alcohol abuse Father 28       exposure   Diabetes Brother    Kidney disease Brother    Heart disease Brother    Hypertension Other    Colon cancer Neg Hx    Esophageal cancer Neg Hx    Rectal cancer Neg Hx    Stomach cancer Neg Hx     Social History   Socioeconomic History   Marital status: Married    Spouse name: Not on file   Number of children: Not on file   Years of education: Not on file   Highest education level: Not on file  Occupational History  Occupation: retired    Comment: but working fulltime  Tobacco Use   Smoking status: Former    Years: 15.00    Types: Cigarettes    Quit date: 01/18/1971    Years since quitting: 51.0   Smokeless tobacco: Current    Types: Snuff  Vaping Use   Vaping Use: Never used  Substance and Sexual Activity   Alcohol use: No   Drug use: No   Sexual activity: Not Currently  Other Topics Concern   Not on file  Social History Narrative   Regular exercise-yes   Social Determinants of Health   Financial Resource Strain: Low Risk  (05/18/2021)   Overall Financial Resource Strain (CARDIA)    Difficulty of Paying Living Expenses: Not hard at all  Food Insecurity: No Food Insecurity (05/18/2021)   Hunger Vital Sign    Worried About Running Out of Food in the Last Year: Never true    [place] in the Last Year: Never true  Transportation  Needs: No Transportation Needs (05/18/2021)   PRAPARE - Hydrologist (Medical): No    Lack of Transportation (Non-Medical): No  Physical Activity: Sufficiently Active (05/18/2021)   Exercise Vital Sign    Days of Exercise per Week: 5 days    Minutes of Exercise per Session: 30 min  Stress: No Stress Concern Present (05/18/2021)   [place]    Feeling of Stress : Not at all  Social Connections: [place] (05/18/2021)   Social Connection and Isolation Panel [NHANES]    Frequency of Communication with Friends and Family: More than three times a week    Frequency of Social Gatherings with Friends and Family: More than three times a week    Attends Religious Services: More than 4 times per year    Active Member of [employer] or Organizations: Yes    Attends Archivist Meetings: More than 4 times per year    Marital Status: Married  Human resources officer Violence: Not At Risk (05/18/2021)   Humiliation, Afraid, Rape, and Kick questionnaire    Fear of Current or Ex-Partner: No    Emotionally Abused: No    Physically Abused: No    Sexually Abused: No     Physical Exam   Vitals:   01/14/22 0515 01/14/22 0530  BP: 118/65 110/62  Pulse: 79 75  Resp:  17  Temp:    SpO2: 91% 92%    CONSTITUTIONAL: Well-appearing, moderate distress due to pain NEURO/PSYCH:  Alert and oriented x 3, no focal deficits EYES:  eyes equal and reactive ENT/NECK:  no LAD, no JVD CARDIO: Regular rate, well-perfused, normal S1 and S2 PULM:  CTAB no wheezing or rhonchi GI/GU: Mildly distended and tender to the lower abdomen MSK/SPINE:  No gross deformities, no edema SKIN:  no rash, atraumatic   *Additional and/or pertinent findings included in MDM below  Diagnostic and Interventional Summary    EKG Interpretation  Date/Time:    Ventricular Rate:    PR Interval:    QRS Duration:   QT Interval:    QTC  Calculation:   R Axis:     Text Interpretation:         Labs Reviewed  CBC - Abnormal; Notable for the following components:      Result Value   WBC 19.2 (*)    RBC 4.04 (*)    Hemoglobin 12.2 (*)    HCT 35.3 (*)  All other components within normal limits  COMPREHENSIVE METABOLIC PANEL - Abnormal; Notable for the following components:   CO2 21 (*)    Glucose, Bld 220 (*)    BUN 25 (*)    Creatinine, Ser 1.40 (*)    Calcium 8.8 (*)    Total Protein 5.9 (*)    GFR, Estimated 50 (*)    All other components within normal limits  URINALYSIS, ROUTINE W REFLEX MICROSCOPIC - Abnormal; Notable for the following components:   Color, Urine RED (*)    APPearance TURBID (*)    Glucose, UA   (*)    Value: TEST NOT REPORTED DUE TO COLOR INTERFERENCE OF URINE PIGMENT   Hgb urine dipstick   (*)    Value: TEST NOT REPORTED DUE TO COLOR INTERFERENCE OF URINE PIGMENT   Bilirubin Urine   (*)    Value: TEST NOT REPORTED DUE TO COLOR INTERFERENCE OF URINE PIGMENT   Ketones, ur   (*)    Value: TEST NOT REPORTED DUE TO COLOR INTERFERENCE OF URINE PIGMENT   Protein, ur   (*)    Value: TEST NOT REPORTED DUE TO COLOR INTERFERENCE OF URINE PIGMENT   Nitrite   (*)    Value: TEST NOT REPORTED DUE TO COLOR INTERFERENCE OF URINE PIGMENT   Leukocytes,Ua   (*)    Value: TEST NOT REPORTED DUE TO COLOR INTERFERENCE OF URINE PIGMENT   All other components within normal limits  URINALYSIS, MICROSCOPIC (REFLEX) - Abnormal; Notable for the following components:   Bacteria, UA RARE (*)    All other components within normal limits  URINE CULTURE  PROTIME-INR    No orders to display    Medications  lidocaine (XYLOCAINE) 2 % jelly 1 Application (has no administration in time range)  HYDROmorphone (DILAUDID) injection 1 mg (1 mg Intravenous Given 01/14/22 0300)     Procedures  /  Critical Care Procedures  ED Course and Medical Decision Making  Initial Impression and Ddx Suspect urinary retention due to  hematuria, some type of bladder procedure today.  I see no documentation of such in the epic/[place] system.  Possibly it was a biopsy done in the office given the very recent diagnosis of bladder cancer.  Either way, patient is a lot of discomfort, small amount of bright red blood in the Foley bag.  I discussed the case with Dr. Felipa Eth, on-call urologist.  We cannot really think of a reason not to exchange the catheter and so we will proceed.  Past medical/surgical history that increases complexity of ED encounter: History of recently diagnosed bladder cancer  Interpretation of Diagnostics I personally reviewed the laboratory assessment and my interpretation is as follows: No significant blood count or electrolyte disturbance  Urine sent for culture.  Patient Reassessment and Ultimate Disposition/Management     Patient's catheter was originally replaced with a 78 [place].  Nursing staff was unable to find a larger size.  Patient was then observed for about an hour while waiting for labs and urine sample.  Patient was not draining any urine during this period of observation, concern for recurrence of urinary retention.  We were able to locate a 66 French catheter and patient agreed with replacement again.  He is now actively draining urine without issue, he wants to go home, he agrees to follow-up with his urologist, he will return if symptoms worsen.  Patient management required discussion with the following services or consulting groups:  None  Complexity of Problems  Addressed Acute illness or injury that poses threat of life of bodily function  Additional Data Reviewed and Analyzed Further history obtained from: Further history from spouse/family member  Additional Factors Impacting ED Encounter Risk None  Barth Kirks. [place] Small, [place] [email or web address]  Final Clinical Impressions(s) / ED Diagnoses     ICD-10-CM   1.  Hematuria, unspecified type  R31.9     2. Generalized abdominal pain  R10.84     3. Urinary retention  R33.9       ED Discharge Orders     None        Discharge Instructions Discussed with and Provided to Patient:     Discharge Instructions      You were evaluated in the Emergency Department and after careful evaluation, we did not find any emergent condition requiring admission or further testing in the hospital.  Your exam/testing today is overall reassuring.  We replaced your catheter because of blood clots and retaining of urine here in the emergency department.  Recommend close follow-up with your urologist.  Please return to the Emergency Department if you experience any worsening of your condition.   Thank you for allowing [place] to be a part of your care.       Maudie Flakes, MD 01/14/22 [phone]

## 2022-01-14 NOTE — Transfer of Care (Signed)
Immediate Anesthesia Transfer of Care Note  Patient: Jeff Burgess  Procedure(s) Performed: [place]  Patient Location: PACU  Anesthesia Type:General  Level of Consciousness: sedated, pateint uncooperative, and responds to stimulation  Airway & Oxygen Therapy: Patient Spontanous Breathing and Patient connected to face mask oxygen  Post-op Assessment: Report given to RN and Post -op Vital signs reviewed and stable  Post vital signs: Reviewed and stable  Last Vitals:  Vitals Value Taken Time  BP 163/83 01/14/22 1939  Temp    Pulse 90 01/14/22 1940  Resp 16 01/14/22 1940  SpO2 100 % 01/14/22 1940  Vitals shown include unvalidated device data.  Last Pain:  Vitals:   01/14/22 1715  TempSrc: Oral  PainSc: 0-No pain         Complications: No notable events documented.

## 2022-01-14 NOTE — ED Triage Notes (Signed)
Pt BIB EMS ([facility]), with reports of bleeding from his foley catheter. Pt is a ca pt that had a bladder tumor resection 01/13/2022. Pt is complaining of bladder pain.

## 2022-01-14 NOTE — Anesthesia Preprocedure Evaluation (Addendum)
Anesthesia Evaluation  Patient identified by MRN, date of birth, ID band Patient awake    Reviewed: Allergy & Precautions, NPO status , Patient's Chart, lab work & pertinent test results  History of Anesthesia Complications Negative for: history of anesthetic complications  Airway Mallampati: I  TM Distance: >3 FB Neck ROM: Full    Dental  (+) Edentulous Upper, Edentulous Lower, Dental Advisory Given   Pulmonary former smoker   Pulmonary exam normal        Cardiovascular hypertension, Pt. on medications + CAD and + CABG  Normal cardiovascular exam  ETT 08/09/14:  There was no ST segment deviation noted during stress. ETT with fair exercise tolerance; no chest pain; normal BP response; no ST changes (baseline RBBB); negative adequate ETT.    Neuro/Psych negative neurological ROS  negative psych ROS   GI/Hepatic negative GI ROS, Neg liver ROS,,,  Endo/Other  diabetes, Type 2, Oral Hypoglycemic Agents    Renal/GU Renal InsufficiencyRenal diseasenegative Renal ROSBladder Ca, BPH Bladder dysfunction      Musculoskeletal  (+) Arthritis , Osteoarthritis,    Abdominal Normal abdominal exam  (+)   Peds  Hematology  (+) Blood dyscrasia, anemia   Anesthesia Other Findings   Reproductive/Obstetrics                             Anesthesia Physical Anesthesia Plan  ASA: III  Anesthesia Plan: General   Post-op Pain Management:    Induction: Intravenous  PONV Risk Score and Plan: 3 and Ondansetron, Dexamethasone and Diphenhydramine  Airway Management Planned: LMA  Additional Equipment: None  Intra-op Plan:   Post-operative Plan: Extubation in OR  Informed Consent: I have reviewed the patients History and Physical, chart, labs and discussed the procedure including the risks, benefits and alternatives for the proposed anesthesia with the patient or authorized representative who has  indicated his/her understanding and acceptance.     Dental advisory given  Plan Discussed with: CRNA  Anesthesia Plan Comments: ( )        Anesthesia Quick Evaluation

## 2022-01-15 ENCOUNTER — Encounter (HOSPITAL_COMMUNITY): Payer: Self-pay | Admitting: Urology

## 2022-01-15 DIAGNOSIS — R310 Gross hematuria: Secondary | ICD-10-CM | POA: Diagnosis not present

## 2022-01-15 DIAGNOSIS — R338 Other retention of urine: Secondary | ICD-10-CM | POA: Diagnosis not present

## 2022-01-15 LAB — URINE CULTURE: Culture: NO GROWTH

## 2022-01-15 LAB — GLUCOSE, CAPILLARY: Glucose-Capillary: 161 mg/dL — ABNORMAL HIGH (ref 70–99)

## 2022-01-15 MED ORDER — CHLORHEXIDINE GLUCONATE CLOTH 2 % EX PADS
6.0000 | MEDICATED_PAD | Freq: Every day | CUTANEOUS | Status: DC
  Administered 2022-01-15: 6 via TOPICAL

## 2022-01-15 NOTE — TOC Initial Note (Signed)
Transition of Care [facility]) - Initial/Assessment Note    Patient Details  Name: Jeff Burgess MRN: [id number] Date of Birth: Jun 19, 1935  Transition of Care [facility]) CM/SW Contact:    Leeroy Cha, RN Phone Number: 01/15/2022, 7:47 AM  Clinical Narrative:                  Transition of Care [facility]) Screening Note   Patient Details  Name: Jeff Burgess Date of Birth: [birth year]   Transition of Care [facility]) CM/SW Contact:    Leeroy Cha, RN Phone Number: 01/15/2022, 7:48 AM    Transition of Care Department [facility]) has reviewed patient and no TOC needs have been identified at this time. We will continue to monitor patient advancement through interdisciplinary progression rounds. If new patient transition needs arise, please place a TOC consult.    Expected Discharge Plan: Home/Self Care Barriers to Discharge: Continued Medical Work up   Patient Goals and CMS Choice Patient states their goals for this hospitalization and ongoing recovery are:: to go home          Expected Discharge Plan and Services   Discharge Planning Services: CM Consult   Living arrangements for the past 2 months: [place]                                      Prior Living Arrangements/Services Living arrangements for the past 2 months: Single Family Home Lives with:: Spouse Patient language and need for interpreter reviewed:: Yes Do you feel safe going back to the place where you live?: Yes            Criminal Activity/Legal Involvement Pertinent to Current Situation/Hospitalization: No - Comment as needed  Activities of Daily Living Home Assistive Devices/Equipment: Walker (specify type), Cane (specify quad or straight) ADL Screening (condition at time of admission) Patient's cognitive ability adequate to safely complete daily activities?: Yes Is the patient deaf or have difficulty hearing?: Yes Does the patient have difficulty seeing, even when wearing  glasses/contacts?: No Does the patient have difficulty concentrating, remembering, or making decisions?: No Patient able to express need for assistance with ADLs?: Yes Does the patient have difficulty dressing or bathing?: No Independently performs ADLs?: Yes (appropriate for developmental age) Does the patient have difficulty walking or climbing stairs?: Yes Weakness of Legs: Both Weakness of Arms/Hands: None  Permission Sought/Granted                  Emotional Assessment Appearance:: Appears stated age Attitude/Demeanor/Rapport: Engaged Affect (typically observed): Appropriate Orientation: : Oriented to Self, Oriented to Place, Oriented to  Time, Oriented to Situation Alcohol / Substance Use: Tobacco Use (quit smoking 51 years ago.) Psych Involvement: No (comment)  Admission diagnosis:  Hematuria [R31.9] Patient Active Problem List   Diagnosis Date Noted   Hematuria 01/14/2022   Wrist pain, left 10/26/2021   Lumbar foraminal stenosis 07/03/2019   Preop exam for internal medicine 06/25/2019   Ataxia 05/14/2019   Hip pain, bilateral 01/16/2018   Trochanteric bursitis 06/17/2017   Double vision 03/09/2016   Gait disorder 12/08/2015   Dysuria 10/28/2015   Bladder cancer ([place]) 08/13/2015   Sialoadenitis 01/01/2015   RBBB 07/12/2014   Pyogenic granuloma 02/06/2013   Well adult exam 11/11/2011   DM (diabetes mellitus), type 2 with renal complications ([place]) 90/24/0973   Hypogonadism male 03/10/2011   Fatigue 11/10/2010   Hyperkalemia 07/06/2010  Neoplasm of uncertain behavior of skin 10/14/2009   UNS ADVRS EFF UNS RX MEDICINAL&BIOLOGICAL SBSTNC 04/25/2008   SEBORRHEIC DERMATITIS 12/28/2007   Actinic keratosis 12/28/2007   Disorder resulting from impaired renal function 09/30/2006   LOW BACK PAIN 09/30/2006   Dyslipidemia 07/26/2006   Essential hypertension 07/26/2006   Coronary atherosclerosis 07/26/2006   Osteoarthritis 07/26/2006   PCP:  Cassandria Anger,  MD Pharmacy:   [place], [place] - [address] [place] [address] Phone: [phone] Fax: [phone]  [id number] [place] [address] [place]: [phone]: [phone]    Social Determinants of Health (SDOH) Social History: [place] No Food Insecurity (01/14/2022)  Housing: Low Risk  (01/14/2022)  Transportation Needs: No Transportation Needs (01/14/2022)  Utilities: Not At Risk (01/14/2022)  Alcohol Screen: Low Risk  (05/18/2021)  Depression (PHQ2-9): Low Risk  (05/18/2021)  Financial Resource Strain: Low Risk  (05/18/2021)  Physical Activity: Sufficiently Active (05/18/2021)  Social Connections: Socially Integrated (05/18/2021)  Stress: No Stress Concern Present (05/18/2021)  Tobacco Use: High Risk (01/14/2022)   SDOH Interventions: Housing Interventions: Patient Refused   Readmission Risk Interventions   No data to display

## 2022-01-15 NOTE — TOC Transition Note (Signed)
Transition of Care [facility]) - CM/SW Discharge Note   Patient Details  Name: Jeff Burgess MRN: [id number] Date of Birth: 01-13-1935  [facility]) CM/SW Contact:  Leeroy Cha, RN Phone Number: 01/15/2022, 1:02 PM   Clinical Narrative:    Patient discharged to return home with self care. No toc needs present.   Final next level of care: Home/Self Care Barriers to Discharge: Barriers Resolved   Patient Goals and CMS Choice      Discharge Placement                         Discharge Plan and Services Additional resources added to the After Visit Summary for     Discharge Planning Services: CM Consult                                 Social Determinants of Health (SDOH) Interventions SDOH Screenings   Food Insecurity: No Food Insecurity (01/14/2022)  Housing: Low Risk  (01/14/2022)  Transportation Needs: No Transportation Needs (01/14/2022)  Utilities: Not At Risk (01/14/2022)  Alcohol Screen: Low Risk  (05/18/2021)  Depression (PHQ2-9): Low Risk  (05/18/2021)  Financial Resource Strain: Low Risk  (05/18/2021)  Physical Activity: Sufficiently Active (05/18/2021)  Social Connections: Socially Integrated (05/18/2021)  Stress: No Stress Concern Present (05/18/2021)  Tobacco Use: High Risk (01/15/2022)     Readmission Risk Interventions   No data to display

## 2022-01-15 NOTE — Discharge Summary (Signed)
Physician Discharge Summary  Patient ID: LONG BRIMAGE MRN: [id number] DOB/AGE: [birth year] 87 y.o.  Admit date: 01/14/2022 Discharge date: 01/15/2022  Admission Diagnoses:  Discharge Diagnoses:  Principal Problem:   Hematuria   Discharged Condition: good  Hospital Course: Patient underwent cystoscopy with clot evacuation and fulguration.  Remained overnight under observation.  The following day his urine was nice and clear and he was discharged with his Foley catheter.  Consults: None  Significant Diagnostic Studies: None  Treatments: surgery: As above  Discharge Exam: Blood pressure (!) 145/72, pulse 76, temperature (!) 97.4 F (36.3 C), resp. rate 19, height '5\' 9"'$  (1.753 m), weight 90.8 kg, SpO2 95 %. General appearance: alert no acute distress Abdomen soft nontender nondistended Full catheter draining clear yellow urine  Disposition: Discharge disposition: 01-Home or Self Care        Allergies as of 01/15/2022       Reactions   Coreg [carvedilol] Other (See Comments)   weakness   Lovastatin    Leg weakness   Morphine Sulfate Other (See Comments)   Per the pt: "After taking morphine, within 30 mins, I was soaking wet. I did not like the way it made me feel."        Medication List     TAKE these medications    amLODipine 5 MG tablet Commonly known as: NORVASC TAKE 1 TABLET BY MOUTH  DAILY   aspirin 81 MG tablet Take 2 tablets (162 mg total) by mouth daily. What changed:  how much to take when to take this   Vitamin D3 1000 units Caps Take 1,000 Units by mouth daily with breakfast.   cholecalciferol 25 MCG (1000 UNIT) tablet Commonly known as: VITAMIN D3 Take 1 tablet (1,000 Units total) by mouth every morning. Follow-up due in April must see provider for refills   Cinnamon 500 MG capsule Take 500 mg by mouth 3 (three) times a week.   HYDROcodone-acetaminophen 5-325 MG tablet Commonly known as: NORCO/VICODIN Take 1 tablet by mouth every 6  (six) hours as needed for moderate pain or severe pain.   metFORMIN 500 MG 24 hr tablet Commonly known as: GLUCOPHAGE-XR TAKE 1 TABLET BY MOUTH  TWICE DAILY What changed: when to take this   methocarbamol 500 MG tablet Commonly known as: ROBAXIN Take 1 tablet (500 mg total) by mouth every 6 (six) hours as needed for muscle spasms.   oxyCODONE 5 MG immediate release tablet Commonly known as: Oxy IR/ROXICODONE Take 1 tablet (5 mg total) by mouth every 3 (three) hours as needed for moderate pain ((score 4 to 6)).   pravastatin 40 MG tablet Commonly known as: PRAVACHOL TAKE 1 TABLET BY MOUTH DAILY What changed: when to take this   telmisartan-hydrochlorothiazide 80-25 MG tablet Commonly known as: MICARDIS HCT Take 1 tablet by mouth daily.   traMADol 50 MG tablet Commonly known as: ULTRAM Take 50 mg by mouth every 6 (six) hours as needed (for pain).   TYLENOL 500 MG tablet Generic drug: acetaminophen Take 500-1,000 mg by mouth every 8 (eight) hours as needed (for pain).         Signed: Marton Redwood, III 01/15/2022, 2:00 PM

## 2022-01-21 DIAGNOSIS — C672 Malignant neoplasm of lateral wall of bladder: Secondary | ICD-10-CM | POA: Diagnosis not present

## 2022-01-24 NOTE — Anesthesia Postprocedure Evaluation (Signed)
Anesthesia Post Note  Patient: Jeff Burgess  Procedure(s) Performed: [place]     Patient location during evaluation: PACU Anesthesia Type: General Level of consciousness: awake and sedated Pain management: pain level controlled Vital Signs Assessment: post-procedure vital signs reviewed and stable Respiratory status: spontaneous breathing Cardiovascular status: stable Postop Assessment: no apparent nausea or vomiting Anesthetic complications: no   No notable events documented.  Last Vitals:  Vitals:   01/15/22 0521 01/15/22 0931  BP: 134/71 (!) 145/72  Pulse: 78 76  Resp: 20 19  Temp: 36.9 C (!) 36.3 C  SpO2: 96% 95%    Last Pain:  Vitals:   01/15/22 1035  TempSrc:   PainSc: 0-No pain                 Huston Foley

## 2022-01-26 ENCOUNTER — Ambulatory Visit: Payer: Medicare Other | Admitting: Internal Medicine

## 2022-02-01 ENCOUNTER — Emergency Department (HOSPITAL_BASED_OUTPATIENT_CLINIC_OR_DEPARTMENT_OTHER): Payer: Medicare Other | Admitting: Radiology

## 2022-02-01 ENCOUNTER — Other Ambulatory Visit: Payer: Self-pay

## 2022-02-01 ENCOUNTER — Encounter (HOSPITAL_BASED_OUTPATIENT_CLINIC_OR_DEPARTMENT_OTHER): Payer: Self-pay

## 2022-02-01 ENCOUNTER — Telehealth: Payer: Self-pay | Admitting: Internal Medicine

## 2022-02-01 ENCOUNTER — Inpatient Hospital Stay (HOSPITAL_BASED_OUTPATIENT_CLINIC_OR_DEPARTMENT_OTHER)
Admission: EM | Admit: 2022-02-01 | Discharge: 2022-02-03 | DRG: 311 | Disposition: A | Discharge status: Home / Self Care | Payer: Medicare Other | Attending: Family Medicine | Admitting: Family Medicine

## 2022-02-01 ENCOUNTER — Encounter (HOSPITAL_COMMUNITY): Payer: Self-pay

## 2022-02-01 DIAGNOSIS — I25110 Atherosclerotic heart disease of native coronary artery with unstable angina pectoris: Secondary | ICD-10-CM | POA: Diagnosis not present

## 2022-02-01 DIAGNOSIS — E1122 Type 2 diabetes mellitus with diabetic chronic kidney disease: Secondary | ICD-10-CM | POA: Diagnosis present

## 2022-02-01 DIAGNOSIS — Z7982 Long term (current) use of aspirin: Secondary | ICD-10-CM | POA: Diagnosis not present

## 2022-02-01 DIAGNOSIS — I2489 Other forms of acute ischemic heart disease: Secondary | ICD-10-CM | POA: Diagnosis not present

## 2022-02-01 DIAGNOSIS — G8929 Other chronic pain: Secondary | ICD-10-CM | POA: Diagnosis present

## 2022-02-01 DIAGNOSIS — F17290 Nicotine dependence, other tobacco product, uncomplicated: Secondary | ICD-10-CM | POA: Diagnosis present

## 2022-02-01 DIAGNOSIS — N3091 Cystitis, unspecified with hematuria: Secondary | ICD-10-CM | POA: Diagnosis present

## 2022-02-01 DIAGNOSIS — Z951 Presence of aortocoronary bypass graft: Secondary | ICD-10-CM | POA: Diagnosis not present

## 2022-02-01 DIAGNOSIS — Z7984 Long term (current) use of oral hypoglycemic drugs: Secondary | ICD-10-CM | POA: Diagnosis not present

## 2022-02-01 DIAGNOSIS — Z833 Family history of diabetes mellitus: Secondary | ICD-10-CM | POA: Diagnosis not present

## 2022-02-01 DIAGNOSIS — I129 Hypertensive chronic kidney disease with stage 1 through stage 4 chronic kidney disease, or unspecified chronic kidney disease: Secondary | ICD-10-CM | POA: Diagnosis not present

## 2022-02-01 DIAGNOSIS — I214 Non-ST elevation (NSTEMI) myocardial infarction: Principal | ICD-10-CM | POA: Diagnosis present

## 2022-02-01 DIAGNOSIS — Z8261 Family history of arthritis: Secondary | ICD-10-CM | POA: Diagnosis not present

## 2022-02-01 DIAGNOSIS — Z888 Allergy status to other drugs, medicaments and biological substances status: Secondary | ICD-10-CM

## 2022-02-01 DIAGNOSIS — C679 Malignant neoplasm of bladder, unspecified: Secondary | ICD-10-CM | POA: Diagnosis present

## 2022-02-01 DIAGNOSIS — Z841 Family history of disorders of kidney and ureter: Secondary | ICD-10-CM

## 2022-02-01 DIAGNOSIS — D649 Anemia, unspecified: Secondary | ICD-10-CM | POA: Diagnosis present

## 2022-02-01 DIAGNOSIS — E1129 Type 2 diabetes mellitus with other diabetic kidney complication: Secondary | ICD-10-CM | POA: Diagnosis present

## 2022-02-01 DIAGNOSIS — R0602 Shortness of breath: Secondary | ICD-10-CM | POA: Diagnosis not present

## 2022-02-01 DIAGNOSIS — M5440 Lumbago with sciatica, unspecified side: Secondary | ICD-10-CM | POA: Diagnosis present

## 2022-02-01 DIAGNOSIS — Z885 Allergy status to narcotic agent status: Secondary | ICD-10-CM

## 2022-02-01 DIAGNOSIS — N1832 Chronic kidney disease, stage 3b: Secondary | ICD-10-CM | POA: Diagnosis present

## 2022-02-01 DIAGNOSIS — Z79899 Other long term (current) drug therapy: Secondary | ICD-10-CM

## 2022-02-01 DIAGNOSIS — M545 Low back pain, unspecified: Secondary | ICD-10-CM | POA: Diagnosis present

## 2022-02-01 DIAGNOSIS — I10 Essential (primary) hypertension: Secondary | ICD-10-CM | POA: Diagnosis present

## 2022-02-01 DIAGNOSIS — I4510 Unspecified right bundle-branch block: Secondary | ICD-10-CM | POA: Diagnosis present

## 2022-02-01 DIAGNOSIS — I472 Ventricular tachycardia, unspecified: Secondary | ICD-10-CM | POA: Diagnosis not present

## 2022-02-01 DIAGNOSIS — I350 Nonrheumatic aortic (valve) stenosis: Secondary | ICD-10-CM | POA: Diagnosis not present

## 2022-02-01 DIAGNOSIS — Z8249 Family history of ischemic heart disease and other diseases of the circulatory system: Secondary | ICD-10-CM | POA: Diagnosis not present

## 2022-02-01 DIAGNOSIS — R319 Hematuria, unspecified: Secondary | ICD-10-CM | POA: Diagnosis present

## 2022-02-01 DIAGNOSIS — C678 Malignant neoplasm of overlapping sites of bladder: Secondary | ICD-10-CM | POA: Diagnosis not present

## 2022-02-01 DIAGNOSIS — R600 Localized edema: Secondary | ICD-10-CM | POA: Diagnosis present

## 2022-02-01 DIAGNOSIS — Z811 Family history of alcohol abuse and dependence: Secondary | ICD-10-CM | POA: Diagnosis not present

## 2022-02-01 DIAGNOSIS — R310 Gross hematuria: Secondary | ICD-10-CM | POA: Diagnosis not present

## 2022-02-01 DIAGNOSIS — E1165 Type 2 diabetes mellitus with hyperglycemia: Secondary | ICD-10-CM | POA: Diagnosis not present

## 2022-02-01 DIAGNOSIS — Z8551 Personal history of malignant neoplasm of bladder: Secondary | ICD-10-CM | POA: Diagnosis not present

## 2022-02-01 DIAGNOSIS — N1831 Chronic kidney disease, stage 3a: Secondary | ICD-10-CM | POA: Diagnosis not present

## 2022-02-01 DIAGNOSIS — R300 Dysuria: Secondary | ICD-10-CM | POA: Diagnosis not present

## 2022-02-01 DIAGNOSIS — R079 Chest pain, unspecified: Secondary | ICD-10-CM | POA: Diagnosis not present

## 2022-02-01 DIAGNOSIS — Z66 Do not resuscitate: Secondary | ICD-10-CM | POA: Diagnosis not present

## 2022-02-01 DIAGNOSIS — I25708 Atherosclerosis of coronary artery bypass graft(s), unspecified, with other forms of angina pectoris: Secondary | ICD-10-CM | POA: Diagnosis not present

## 2022-02-01 DIAGNOSIS — N400 Enlarged prostate without lower urinary tract symptoms: Secondary | ICD-10-CM | POA: Diagnosis present

## 2022-02-01 LAB — CBC
HCT: 35.8 % — ABNORMAL LOW (ref 39.0–52.0)
Hemoglobin: 11.9 g/dL — ABNORMAL LOW (ref 13.0–17.0)
MCH: 29.7 pg (ref 26.0–34.0)
MCHC: 33.2 g/dL (ref 30.0–36.0)
MCV: 89.3 fL (ref 80.0–100.0)
Platelets: 235 10*3/uL (ref 150–400)
RBC: 4.01 MIL/uL — ABNORMAL LOW (ref 4.22–5.81)
RDW: 13.2 % (ref 11.5–15.5)
WBC: 9.4 10*3/uL (ref 4.0–10.5)
nRBC: 0 % (ref 0.0–0.2)

## 2022-02-01 LAB — BASIC METABOLIC PANEL
Anion gap: 11 (ref 5–15)
BUN: 18 mg/dL (ref 8–23)
CO2: 24 mmol/L (ref 22–32)
Calcium: 9.2 mg/dL (ref 8.9–10.3)
Chloride: 106 mmol/L (ref 98–111)
Creatinine, Ser: 1.23 mg/dL (ref 0.61–1.24)
GFR, Estimated: 58 mL/min — ABNORMAL LOW (ref >60)
Glucose, Bld: 203 mg/dL — ABNORMAL HIGH (ref 70–99)
Potassium: 4 mmol/L (ref 3.5–5.1)
Sodium: 141 mmol/L (ref 135–145)

## 2022-02-01 LAB — TROPONIN I (HIGH SENSITIVITY)
Troponin I (High Sensitivity): 221 ng/L (ref <18)
Troponin I (High Sensitivity): 276 ng/L (ref <18)

## 2022-02-01 MED ORDER — HEPARIN BOLUS VIA INFUSION
4000.0000 [IU] | Freq: Once | INTRAVENOUS | Status: AC
  Administered 2022-02-01: 4000 [IU] via INTRAVENOUS

## 2022-02-01 MED ORDER — HEPARIN (PORCINE) 25000 UT/250ML-% IV SOLN
1100.0000 [IU]/h | INTRAVENOUS | Status: DC
  Administered 2022-02-01: 1100 [IU]/h via INTRAVENOUS
  Filled 2022-02-01: qty 250

## 2022-02-01 MED ORDER — ASPIRIN 325 MG PO TABS
325.0000 mg | ORAL_TABLET | Freq: Every day | ORAL | Status: DC
  Administered 2022-02-01: 325 mg via ORAL
  Filled 2022-02-01: qty 1

## 2022-02-01 NOTE — ED Provider Notes (Signed)
[facility] Provider Note   CSN: [id number] Arrival date & time: 02/01/22  1823     History  Chief Complaint  Patient presents with   Chest Pain    Jeff Burgess is a 87 y.o. male.   Chest Pain    87 year old male with medical history significant for single-vessel CABG in the 90s who presents to the emergency department with left-sided chest pain.  The patient states that he developed an episode of chest pain for about 10 minutes after eating on Saturday that subsequently resolved.  It occurred 10 today after eating and he thought it might be reflux.  He describes it as a mild sharp pressure-like sensation.  He denies any nausea, diaphoresis, shortness of breath, fever or chills.  Symptoms lasted for 10 minutes today and have since resolved.  Pain was nonradiating and described as a dull pressure-like sensation.  Home Medications Prior to Admission medications   Medication Sig Start Date End Date Taking? Authorizing Provider  amLODipine (NORVASC) 5 MG tablet TAKE 1 TABLET BY MOUTH  DAILY Patient taking differently: Take 5 mg by mouth daily. 06/01/21   Plotnikov, Evie Lacks, MD  aspirin 81 MG tablet Take 2 tablets (162 mg total) by mouth daily. Patient taking differently: Take 81 mg by mouth in the morning and at bedtime. 03/09/16   Plotnikov, Evie Lacks, MD  cholecalciferol (VITAMIN D) 25 MCG (1000 UT) tablet Take 1 tablet (1,000 Units total) by mouth every morning. Follow-up due in April must see provider for refills Patient not taking: Reported on 01/14/2022 01/23/18   Plotnikov, Evie Lacks, MD  Cholecalciferol (VITAMIN D3) 1000 units CAPS Take 1,000 Units by mouth daily with breakfast.    [provider]  Cinnamon 500 MG capsule Take 500 mg by mouth 3 (three) times a week.    [provider]  HYDROcodone-acetaminophen (NORCO/VICODIN) 5-325 MG tablet Take 1 tablet by mouth every 6 (six) hours as needed for moderate pain or  severe pain. 01/13/22   [provider]  metFORMIN (GLUCOPHAGE-XR) 500 MG 24 hr tablet TAKE 1 TABLET BY MOUTH  TWICE DAILY Patient taking differently: Take 500 mg by mouth 2 (two) times daily with a meal. 06/09/21   Plotnikov, Evie Lacks, MD  methocarbamol (ROBAXIN) 500 MG tablet Take 1 tablet (500 mg total) by mouth every 6 (six) hours as needed for muscle spasms. Patient not taking: Reported on 01/14/2022 07/04/19   Erline Levine, MD  oxyCODONE (OXY IR/ROXICODONE) 5 MG immediate release tablet Take 1 tablet (5 mg total) by mouth every 3 (three) hours as needed for moderate pain ((score 4 to 6)). Patient not taking: Reported on 01/14/2022 07/04/19   Erline Levine, MD  pravastatin (PRAVACHOL) 40 MG tablet TAKE 1 TABLET BY MOUTH DAILY Patient taking differently: Take 40 mg by mouth at bedtime. 09/09/21   Plotnikov, Evie Lacks, MD  telmisartan-hydrochlorothiazide (MICARDIS HCT) 80-25 MG tablet Take 1 tablet by mouth daily. 07/02/21   Plotnikov, Evie Lacks, MD  traMADol (ULTRAM) 50 MG tablet Take 50 mg by mouth every 6 (six) hours as needed (for pain).    [provider]  TYLENOL 500 MG tablet Take 500-1,000 mg by mouth every 8 (eight) hours as needed (for pain).    [provider]      Allergies    Coreg [carvedilol], Lovastatin, and Morphine sulfate    Review of Systems   Review of Systems  Cardiovascular:  Positive for chest pain.  All  other systems reviewed and are negative.   Physical Exam Updated Vital Signs BP (!) 163/69   Pulse 73   Temp 98.7 F (37.1 C) (Oral)   Resp 17   Ht '5\' 9"'$  (1.753 m)   Wt 90.8 kg   SpO2 97%   BMI 29.56 kg/m  Physical Exam Vitals and nursing note reviewed.  Constitutional:      General: He is not in acute distress.    Appearance: He is well-developed.  HENT:     Head: Normocephalic and atraumatic.  Eyes:     Conjunctiva/sclera: Conjunctivae normal.  Neck:     Vascular: No JVD.  Cardiovascular:     Rate and Rhythm: Normal rate  and regular rhythm.     Pulses: Normal pulses.     Heart sounds: No murmur heard. Pulmonary:     Effort: Pulmonary effort is normal. No respiratory distress.     Breath sounds: Normal breath sounds.  Abdominal:     Palpations: Abdomen is soft.     Tenderness: There is no abdominal tenderness.  Musculoskeletal:        General: No swelling.     Cervical back: Neck supple.     Right lower leg: No edema.     Left lower leg: No edema.  Skin:    General: Skin is warm and dry.     Capillary Refill: Capillary refill takes less than 2 seconds.  Neurological:     Mental Status: He is alert.  Psychiatric:        Mood and Affect: Mood normal.     ED Results / Procedures / Treatments   Labs (all labs ordered are listed, but only abnormal results are displayed) Labs Reviewed  BASIC METABOLIC PANEL - Abnormal; Notable for the following components:      Result Value   Glucose, Bld 203 (*)    GFR, Estimated 58 (*)    All other components within normal limits  CBC - Abnormal; Notable for the following components:   RBC 4.01 (*)    Hemoglobin 11.9 (*)    HCT 35.8 (*)    All other components within normal limits  TROPONIN I (HIGH SENSITIVITY) - Abnormal; Notable for the following components:   Troponin I (High Sensitivity) 221 (*)    All other components within normal limits  TROPONIN I (HIGH SENSITIVITY) - Abnormal; Notable for the following components:   Troponin I (High Sensitivity) 276 (*)    All other components within normal limits  HEPARIN LEVEL (UNFRACTIONATED)  CBC  URINALYSIS, COMPLETE (UACMP) WITH MICROSCOPIC    EKG EKG Interpretation  Date/Time:  Monday February 01 2022 18:37:55 EST Ventricular Rate:  89 PR Interval:  184 QRS Duration: 140 QT Interval:  384 QTC Calculation: 467 R Axis:   -70 Text Interpretation: Sinus rhythm with occasional Premature ventricular complexes Left axis deviation Right bundle branch block Abnormal ECG When compared with ECG of 27-Jun-2019  11:50, Premature ventricular complexes are now Present QRS axis Shifted left Confirmed by Regan Lemming ([id number]) on 02/01/2022 9:16:20 PM  Radiology DG Chest 2 View  Result Date: 02/01/2022 CLINICAL DATA:  Chest pain since Saturday, especially LEFT chest after eating mild shortness of breath at times EXAM: CHEST - 2 VIEW COMPARISON:  CT chest 05/19/2015 FINDINGS: Normal heart size post median sternotomy. Mediastinal contours and pulmonary vascularity normal. Atherosclerotic calcification aorta. Small calcified pleural plaques RIGHT upper lobe, better demonstrated on prior CT. Lungs otherwise clear. No pulmonary infiltrate, pleural effusion, or pneumothorax.  Osseous structures unremarkable. IMPRESSION: Chronic calcified pleural plaques RIGHT hemithorax; these are BILATERAL on prior CT, favor asbestos exposure. No acute abnormalities. Electronically Signed   By: Lavonia Dana M.D.   On: 02/01/2022 19:43    Procedures .Critical Care  Performed by: Regan Lemming, MD Authorized by: Regan Lemming, MD   Critical care provider statement:    Critical care time (minutes):  30   Critical care was time spent personally by me on the following activities:  Development of treatment plan with patient or surrogate, discussions with consultants, evaluation of patient's response to treatment, examination of patient, ordering and review of laboratory studies, ordering and review of radiographic studies, ordering and performing treatments and interventions, pulse oximetry, re-evaluation of patient's condition and review of old charts   Care discussed with: admitting provider       Medications Ordered in ED Medications  aspirin tablet 325 mg (325 mg Oral Given 02/01/22 2155)  heparin ADULT infusion 100 units/mL (25000 units/253m) (1,100 Units/hr Intravenous New Bag/Given 02/01/22 2158)  heparin bolus via infusion 4,000 Units (4,000 Units Intravenous Bolus from Bag 02/01/22 2158)    ED Course/ Medical Decision Making/  A&P Clinical Course as of 02/02/22 0203  Mon Feb 01, 2022  2116 Troponin I (High Sensitivity)(!!): 221 [JL]  2300 Troponin I (High Sensitivity)(!!): 276 [JL]    Clinical Course User Index [JL] LRegan Lemming MD                             Medical Decision Making Amount and/or Complexity of Data Reviewed Labs: ordered. Decision-making details documented in ED Course. Radiology: ordered.  Risk OTC drugs. Prescription drug management. Decision regarding hospitalization.    87year old male with medical history significant for single-vessel CABG in the 90s who presents to the emergency department with left-sided chest pain.  The patient states that he developed an episode of chest pain for about 10 minutes after eating on Saturday that subsequently resolved.  It occurred 10 today after eating and he thought it might be reflux.  He describes it as a mild sharp pressure-like sensation.  He denies any nausea, diaphoresis, shortness of breath, fever or chills.  Symptoms lasted for 10 minutes today and have since resolved.  Pain was nonradiating and described as a dull pressure-like sensation.  On arrival, the patient was vitally stable, afebrile, not tachycardic or tachypneic, hypertensive BP 191/84, saturating 99% on room air.  EKG revealed sinus rhythm, ventricular rate 89, PVCs present, right bundle branch block, no STEMI.  Chest x-ray revealed chronic calcified pleural plaques in the right hemithorax consistent with potential asbestos exposure.  Laboratory evaluation significant for an elevated troponin to 221, repeat climbing to 276.  The patient remains chest pain-free.  Will initiate the patient on heparin due to concern for NSTEMI given his cardiac history.  He has not had a cardiac catheterization in the last 30 years.  He was administered 325 mg of aspirin.  He remained without chest pain in the emergency department.  Cardiology on-call was consulted and will see the patient in  consultation with consideration for cardiac catheterization likely in the morning.  Hospitalist medicine was consulted for admission and Dr. GAlcario Droughtaccepted the patient in admission.  Final Clinical Impression(s) / ED Diagnoses Final diagnoses:  [place]STEMI (non-ST elevated myocardial infarction[place]    Rx / DC Orders ED Discharge Orders     None         Katrell Milhorn,  Jeneen Rinks, MD 02/02/22 [phone]

## 2022-02-01 NOTE — Progress Notes (Signed)
ANTICOAGULATION CONSULT NOTE - Initial Consult  Pharmacy Consult for heparin Indication: chest pain/ACS  Allergies  Allergen Reactions   Coreg [Carvedilol] Other (See Comments)    weakness   Lovastatin     Leg weakness   Morphine Sulfate Other (See Comments)    Per the pt: "After taking morphine, within 30 mins, I was soaking wet. I did not like the way it made me feel."    Patient Measurements: Height: '5\' 9"'$  (175.3 cm) Weight: 90.8 kg (200 lb 2.8 oz) IBW/kg (Calculated) : 70.7 HEPARIN DW (KG): 89.1   Vital Signs: Temp: 98.1 F (36.7 C) (01/22 1840) Temp Source: Oral (01/22 1840) BP: 151/80 (01/22 2130) Pulse Rate: 71 (01/22 2130)  Labs: Recent Labs    02/01/22 1850  HGB 11.9*  HCT 35.8*  PLT 235  CREATININE 1.23  TROPONINIHS 221*    Estimated Creatinine Clearance: 49.8 mL/min (by C-G formula based on SCr of 1.23 mg/dL).   Medical History: Past Medical History:  Diagnosis Date   Benign localized prostatic hyperplasia with lower urinary tract symptoms (LUTS)    Bladder cancer [facility]) dx 05/ 2017--  urologist- dr Pilar Jarvis   TCC , Ta of bladder  s/p TURBT 06-02-2015   CAD (coronary artery disease)    no cardiologist--  followed by pcp,  dr plontnikov   Chronic low back pain with sciatica    CKD (chronic kidney disease), stage III ([place])    Diabetes mellitus, type 2 ([place])    ED (erectile dysfunction)    Full dentures    History of ETT    08-09-2014   fair exercise tolerance, no chest pain, normal BP response, no ST changes (baseline RBBB);  negative adequate ETT   Hyperlipidemia    Hypertension    Hypogonadism male    OA (osteoarthritis)    Right bundle branch block (RBBB)    S/P CABG x 1    1998   Sigmoid diverticulosis    Ventral hernia    midline   Wears glasses      Assessment: Patient presenting to the ED with chest pain. No AC PTA. Pharmacy consulted to dose heparin.   Hgb 11.9 and plt 235. Scr 1.23.   Goal of Therapy:  Heparin level 0.3-0.7  units/ml Monitor platelets by anticoagulation protocol: Yes   Plan:  Give heparin 4000 units IV x1  Start heparin infusion at 1100 units/hr  Will f/u heparin level in 8 hours Monitor daily heparin level and CBC Continue to monitor H&H     Gena Fray, PharmD PGY1 Pharmacy Resident   02/01/2022 10:21 PM

## 2022-02-01 NOTE — Telephone Encounter (Signed)
Patients daughter called and said that patient was having chest pains and he had went to urgent care and they told him that there was nothing that they could do for him there. She said he is feeling better but that patient said he does not want to go to the hospital and that they wanted to know if Dr Alain Marion will see him tomorrow morning. She said they want advise from Dr Alain Marion and dont want to be triaged or go to the hospital. She asked me if I could just send the message to Dr Alain Marion. Please advise patient

## 2022-02-01 NOTE — ED Triage Notes (Signed)
Patient here POV from Home.  Endorses CP that began Saturday. Occurred to Left Chest after eating. Intermittent since then and occurred again today.  Mild SOB at Times.   NAD Noted during Triage. A&Ox4. GCS 15. Ambulatory with walker.

## 2022-02-02 ENCOUNTER — Inpatient Hospital Stay (HOSPITAL_COMMUNITY): Payer: Medicare Other

## 2022-02-02 DIAGNOSIS — Z951 Presence of aortocoronary bypass graft: Secondary | ICD-10-CM | POA: Diagnosis not present

## 2022-02-02 DIAGNOSIS — I4510 Unspecified right bundle-branch block: Secondary | ICD-10-CM | POA: Diagnosis not present

## 2022-02-02 DIAGNOSIS — Z811 Family history of alcohol abuse and dependence: Secondary | ICD-10-CM | POA: Diagnosis not present

## 2022-02-02 DIAGNOSIS — Z8249 Family history of ischemic heart disease and other diseases of the circulatory system: Secondary | ICD-10-CM | POA: Diagnosis not present

## 2022-02-02 DIAGNOSIS — Z66 Do not resuscitate: Secondary | ICD-10-CM | POA: Diagnosis present

## 2022-02-02 DIAGNOSIS — C678 Malignant neoplasm of overlapping sites of bladder: Secondary | ICD-10-CM | POA: Diagnosis not present

## 2022-02-02 DIAGNOSIS — I214 Non-ST elevation (NSTEMI) myocardial infarction: Secondary | ICD-10-CM

## 2022-02-02 DIAGNOSIS — N400 Enlarged prostate without lower urinary tract symptoms: Secondary | ICD-10-CM | POA: Diagnosis present

## 2022-02-02 DIAGNOSIS — Z833 Family history of diabetes mellitus: Secondary | ICD-10-CM | POA: Diagnosis not present

## 2022-02-02 DIAGNOSIS — C679 Malignant neoplasm of bladder, unspecified: Secondary | ICD-10-CM | POA: Diagnosis not present

## 2022-02-02 DIAGNOSIS — R300 Dysuria: Secondary | ICD-10-CM | POA: Diagnosis not present

## 2022-02-02 DIAGNOSIS — I472 Ventricular tachycardia, unspecified: Secondary | ICD-10-CM | POA: Diagnosis present

## 2022-02-02 DIAGNOSIS — I2489 Other forms of acute ischemic heart disease: Secondary | ICD-10-CM | POA: Diagnosis present

## 2022-02-02 DIAGNOSIS — E1122 Type 2 diabetes mellitus with diabetic chronic kidney disease: Secondary | ICD-10-CM

## 2022-02-02 DIAGNOSIS — M5440 Lumbago with sciatica, unspecified side: Secondary | ICD-10-CM

## 2022-02-02 DIAGNOSIS — I129 Hypertensive chronic kidney disease with stage 1 through stage 4 chronic kidney disease, or unspecified chronic kidney disease: Secondary | ICD-10-CM | POA: Diagnosis present

## 2022-02-02 DIAGNOSIS — I350 Nonrheumatic aortic (valve) stenosis: Secondary | ICD-10-CM | POA: Diagnosis not present

## 2022-02-02 DIAGNOSIS — R310 Gross hematuria: Secondary | ICD-10-CM | POA: Diagnosis not present

## 2022-02-02 DIAGNOSIS — I25708 Atherosclerosis of coronary artery bypass graft(s), unspecified, with other forms of angina pectoris: Secondary | ICD-10-CM | POA: Diagnosis not present

## 2022-02-02 DIAGNOSIS — Z8551 Personal history of malignant neoplasm of bladder: Secondary | ICD-10-CM | POA: Diagnosis not present

## 2022-02-02 DIAGNOSIS — N1832 Chronic kidney disease, stage 3b: Secondary | ICD-10-CM | POA: Diagnosis present

## 2022-02-02 DIAGNOSIS — F17290 Nicotine dependence, other tobacco product, uncomplicated: Secondary | ICD-10-CM | POA: Diagnosis present

## 2022-02-02 DIAGNOSIS — G8929 Other chronic pain: Secondary | ICD-10-CM

## 2022-02-02 DIAGNOSIS — E1165 Type 2 diabetes mellitus with hyperglycemia: Secondary | ICD-10-CM | POA: Diagnosis present

## 2022-02-02 DIAGNOSIS — D649 Anemia, unspecified: Secondary | ICD-10-CM | POA: Diagnosis present

## 2022-02-02 DIAGNOSIS — N1831 Chronic kidney disease, stage 3a: Secondary | ICD-10-CM | POA: Diagnosis not present

## 2022-02-02 DIAGNOSIS — N3091 Cystitis, unspecified with hematuria: Secondary | ICD-10-CM | POA: Diagnosis present

## 2022-02-02 DIAGNOSIS — Z7984 Long term (current) use of oral hypoglycemic drugs: Secondary | ICD-10-CM | POA: Diagnosis not present

## 2022-02-02 DIAGNOSIS — Z841 Family history of disorders of kidney and ureter: Secondary | ICD-10-CM | POA: Diagnosis not present

## 2022-02-02 DIAGNOSIS — Z8261 Family history of arthritis: Secondary | ICD-10-CM | POA: Diagnosis not present

## 2022-02-02 DIAGNOSIS — Z7982 Long term (current) use of aspirin: Secondary | ICD-10-CM | POA: Diagnosis not present

## 2022-02-02 DIAGNOSIS — I25110 Atherosclerotic heart disease of native coronary artery with unstable angina pectoris: Secondary | ICD-10-CM | POA: Diagnosis present

## 2022-02-02 LAB — ECHOCARDIOGRAM COMPLETE
AR max vel: 1.21 cm2
AV Area VTI: 1.21 cm2
AV Area mean vel: 1.1 cm2
AV Mean grad: 25 mmHg
AV Peak grad: 41.3 mmHg
Ao pk vel: 3.22 m/s
Area-P 1/2: 3.48 cm2
Calc EF: 38.9 %
Est EF: 55
Height: 69 in
P 1/2 time: 442 msec
S' Lateral: 4.5 cm
Single Plane A2C EF: 33 %
Single Plane A4C EF: 43.1 %
Weight: 3202.84 oz

## 2022-02-02 LAB — URINALYSIS, COMPLETE (UACMP) WITH MICROSCOPIC
Bacteria, UA: NONE SEEN
Bilirubin Urine: NEGATIVE
Glucose, UA: NEGATIVE mg/dL
Ketones, ur: NEGATIVE mg/dL
Nitrite: NEGATIVE
Protein, ur: 30 mg/dL — AB
RBC / HPF: >50 RBC/hpf — ABNORMAL HIGH (ref 0–5)
Specific Gravity, Urine: 1.019 (ref 1.005–1.030)
WBC, UA: >50 WBC/hpf — ABNORMAL HIGH (ref 0–5)
pH: 5.5 (ref 5.0–8.0)

## 2022-02-02 LAB — HEMOGLOBIN AND HEMATOCRIT, BLOOD
HCT: 33.7 % — ABNORMAL LOW (ref 39.0–52.0)
HCT: 31.1 % — ABNORMAL LOW (ref 39.0–52.0)
HCT: 33.8 % — ABNORMAL LOW (ref 39.0–52.0)
Hemoglobin: 11.3 g/dL — ABNORMAL LOW (ref 13.0–17.0)
Hemoglobin: 10.8 g/dL — ABNORMAL LOW (ref 13.0–17.0)
Hemoglobin: 11.4 g/dL — ABNORMAL LOW (ref 13.0–17.0)

## 2022-02-02 LAB — CBC
HCT: 33.1 % — ABNORMAL LOW (ref 39.0–52.0)
Hemoglobin: 11.1 g/dL — ABNORMAL LOW (ref 13.0–17.0)
MCH: 30.1 pg (ref 26.0–34.0)
MCHC: 33.5 g/dL (ref 30.0–36.0)
MCV: 89.7 fL (ref 80.0–100.0)
Platelets: 207 10*3/uL (ref 150–400)
RBC: 3.69 MIL/uL — ABNORMAL LOW (ref 4.22–5.81)
RDW: 13 % (ref 11.5–15.5)
WBC: 8.1 10*3/uL (ref 4.0–10.5)
nRBC: 0 % (ref 0.0–0.2)

## 2022-02-02 LAB — BASIC METABOLIC PANEL
Anion gap: 6 (ref 5–15)
BUN: 14 mg/dL (ref 8–23)
CO2: 24 mmol/L (ref 22–32)
Calcium: 8.4 mg/dL — ABNORMAL LOW (ref 8.9–10.3)
Chloride: 110 mmol/L (ref 98–111)
Creatinine, Ser: 1.23 mg/dL (ref 0.61–1.24)
GFR, Estimated: 58 mL/min — ABNORMAL LOW (ref >60)
Glucose, Bld: 132 mg/dL — ABNORMAL HIGH (ref 70–99)
Potassium: 3.7 mmol/L (ref 3.5–5.1)
Sodium: 140 mmol/L (ref 135–145)

## 2022-02-02 LAB — GLUCOSE, CAPILLARY
Glucose-Capillary: 157 mg/dL — ABNORMAL HIGH (ref 70–99)
Glucose-Capillary: 126 mg/dL — ABNORMAL HIGH (ref 70–99)

## 2022-02-02 LAB — TROPONIN I (HIGH SENSITIVITY): Troponin I (High Sensitivity): 265 ng/L (ref <18)

## 2022-02-02 LAB — HEMOGLOBIN A1C
Hgb A1c MFr Bld: 6.9 % — ABNORMAL HIGH (ref 4.8–5.6)
Mean Plasma Glucose: 151.33 mg/dL

## 2022-02-02 LAB — HEPARIN LEVEL (UNFRACTIONATED): Heparin Unfractionated: 0.21 IU/mL — ABNORMAL LOW (ref 0.30–0.70)

## 2022-02-02 LAB — MAGNESIUM: Magnesium: 1.8 mg/dL (ref 1.7–2.4)

## 2022-02-02 LAB — BRAIN NATRIURETIC PEPTIDE: B Natriuretic Peptide: 363.6 pg/mL — ABNORMAL HIGH (ref 0.0–100.0)

## 2022-02-02 MED ORDER — MAGNESIUM SULFATE 2 GM/50ML IV SOLN
2.0000 g | Freq: Once | INTRAVENOUS | Status: AC
  Administered 2022-02-02: 2 g via INTRAVENOUS
  Filled 2022-02-02: qty 50

## 2022-02-02 MED ORDER — INSULIN ASPART 100 UNIT/ML IJ SOLN
0.0000 [IU] | Freq: Three times a day (TID) | INTRAMUSCULAR | Status: DC
  Administered 2022-02-02: 2 [IU] via SUBCUTANEOUS
  Administered 2022-02-02 – 2022-02-03 (×3): 1 [IU] via SUBCUTANEOUS

## 2022-02-02 MED ORDER — AMOXICILLIN-POT CLAVULANATE 875-125 MG PO TABS
1.0000 | ORAL_TABLET | Freq: Two times a day (BID) | ORAL | Status: DC
  Administered 2022-02-02 – 2022-02-03 (×2): 1 via ORAL
  Filled 2022-02-02 (×3): qty 1

## 2022-02-02 MED ORDER — MAGNESIUM HYDROXIDE 400 MG/5ML PO SUSP: 30.0000 mL | Freq: Every day | ORAL | Status: DC | PRN

## 2022-02-02 MED ORDER — ATORVASTATIN CALCIUM 80 MG PO TABS
80.0000 mg | ORAL_TABLET | Freq: Every day | ORAL | Status: DC
  Administered 2022-02-02 – 2022-02-03 (×2): 80 mg via ORAL
  Filled 2022-02-02 (×3): qty 1

## 2022-02-02 MED ORDER — PANTOPRAZOLE SODIUM 40 MG IV SOLR
40.0000 mg | INTRAVENOUS | Status: DC
  Administered 2022-02-02 – 2022-02-03 (×2): 40 mg via INTRAVENOUS
  Filled 2022-02-02 (×2): qty 10

## 2022-02-02 MED ORDER — INSULIN ASPART 100 UNIT/ML IJ SOLN: 0.0000 [IU] | Freq: Every day | INTRAMUSCULAR | Status: DC

## 2022-02-02 MED ORDER — ACETAMINOPHEN 325 MG PO TABS: 650.0000 mg | ORAL_TABLET | ORAL | Status: DC | PRN

## 2022-02-02 MED ORDER — ONDANSETRON HCL 4 MG/2ML IJ SOLN: 4.0000 mg | Freq: Four times a day (QID) | INTRAMUSCULAR | Status: DC | PRN

## 2022-02-02 MED ORDER — HYDROCHLOROTHIAZIDE 25 MG PO TABS
25.0000 mg | ORAL_TABLET | Freq: Every day | ORAL | Status: DC
  Administered 2022-02-02 – 2022-02-03 (×2): 25 mg via ORAL
  Filled 2022-02-02 (×2): qty 1

## 2022-02-02 MED ORDER — ASPIRIN 300 MG RE SUPP
300.0000 mg | RECTAL | Status: AC
  Filled 2022-02-02: qty 1

## 2022-02-02 MED ORDER — HYDROCODONE-ACETAMINOPHEN 5-325 MG PO TABS: 1.0000 | ORAL_TABLET | Freq: Four times a day (QID) | ORAL | Status: DC | PRN

## 2022-02-02 MED ORDER — IRBESARTAN 150 MG PO TABS
300.0000 mg | ORAL_TABLET | Freq: Every day | ORAL | Status: DC
  Administered 2022-02-02 – 2022-02-03 (×2): 300 mg via ORAL
  Filled 2022-02-02 (×3): qty 2

## 2022-02-02 MED ORDER — TELMISARTAN-HCTZ 80-25 MG PO TABS: 1.0000 | ORAL_TABLET | Freq: Every day | ORAL | Status: DC

## 2022-02-02 MED ORDER — AMLODIPINE BESYLATE 5 MG PO TABS
5.0000 mg | ORAL_TABLET | Freq: Every day | ORAL | Status: DC
  Administered 2022-02-02 – 2022-02-03 (×2): 5 mg via ORAL
  Filled 2022-02-02 (×2): qty 1

## 2022-02-02 MED ORDER — SODIUM CHLORIDE 0.9 % IV SOLN: INTRAVENOUS | Status: DC

## 2022-02-02 MED ORDER — ASPIRIN 81 MG PO CHEW
324.0000 mg | CHEWABLE_TABLET | ORAL | Status: AC
  Administered 2022-02-02: 324 mg via ORAL
  Filled 2022-02-02: qty 4

## 2022-02-02 MED ORDER — TRAZODONE HCL 50 MG PO TABS
25.0000 mg | ORAL_TABLET | Freq: Every evening | ORAL | Status: DC | PRN
  Administered 2022-02-02: 25 mg via ORAL
  Filled 2022-02-02: qty 1

## 2022-02-02 MED ORDER — POTASSIUM CHLORIDE CRYS ER 20 MEQ PO TBCR
30.0000 meq | EXTENDED_RELEASE_TABLET | Freq: Once | ORAL | Status: AC
  Administered 2022-02-02: 30 meq via ORAL
  Filled 2022-02-02: qty 1

## 2022-02-02 MED ORDER — ASPIRIN 81 MG PO TBEC
81.0000 mg | DELAYED_RELEASE_TABLET | Freq: Every day | ORAL | Status: DC
  Administered 2022-02-03: 81 mg via ORAL
  Filled 2022-02-02 (×2): qty 1

## 2022-02-02 MED ORDER — NITROGLYCERIN 0.4 MG SL SUBL: 0.4000 mg | SUBLINGUAL_TABLET | SUBLINGUAL | Status: DC | PRN

## 2022-02-02 NOTE — Consult Note (Addendum)
Cardiology Consultation   Patient ID: Jeff Burgess MRN: [id number]; DOB: 03/13/1935  Admit date: 02/01/2022 Date of Consult: 02/02/2022  PCP:  Cassandria Anger, MD   [facility] Providers Cardiologist:  None        Patient Profile:   Jeff Burgess is a 87 y.o. male with a hx of CAD s/p CABG 1998, heart murmur (no prior echo available), bladder CA, CKD stage 3a, ED, DM, HTN, HLD, diverticulosis, RBBB, mild carotid artery disease (1-39% BICA in 2018), abnormal ABIs 2019 (duplex with total occlusion of R posterior tib and L anterior tib) who is being seen 02/02/2022 for the evaluation of chest pain at the request of Dr. Armandina Gemma.  History of Present Illness:   Jeff Burgess had remote CABG in 1998 but has not been followed by cardiology at least over the last decade plus. He had a negative ETT 2016 ordered by PCP. In 2019 he had abnormal LE duplex with total occlusion of R posterior tib and L anterior tib and was seen by [facility] but no intervention felt necessary at that time. He has recently had issues with hematuria and underwent cystoscopy with clot evacuation and fulguration by urology 01/14/22. 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 He does carry prior h/o bladder CA. Ever since that surgery he reports feeling generally weak. He also had 2 episodes of chest pain, once on Saturday with exertion walking to his workshop at his home, relieved with rest and then another one yesterday soon after eating across his chest, lasting a few minutes, resolving without intevention. He has also had recurrent hematuria, urinary burning and stinging. Given the constellation of symptoms he went to [facility]. Labs showed anemia with Hgb 11.9->11.1 (previously 14's), hsTroponin 221->226, Cr 1.23. He received '324mg'$  ASA and was started on heparin and IV fluids. He also reports a history of mild LE edema which he states was more prominent post-surgery but has  improved - he does recall having this before his urologic surgery as well. He has a prominent murmur on exam which he states he's been told he has in the past. He was transferred to [facility] for admission on the hospitalist service with cardiology to follow in consultation. He continues to have gross hematuria this morning. EKG shows NSR with known RBBB, occasional PVC. His blood pressure was elevated at 191/84 on admission. He is presently 97% RA. He denies any significant SOB - reports perhaps his endurance has been down recently but no overt gasping or having to stop to catch his breath. No orthopnea.   Past Medical History:  Diagnosis Date   Benign localized prostatic hyperplasia with lower urinary tract symptoms (LUTS)    Bladder cancer [facility]) dx 05/ 2017--  urologist- dr Pilar Jarvis   TCC , Ta of bladder  s/p TURBT 06-02-2015   CAD (coronary artery disease)    no cardiologist--  followed by pcp,  dr plontnikov   Chronic low back pain with sciatica    CKD (chronic kidney disease), stage III ([place])    Diabetes mellitus, type 2 ([place])    ED (erectile dysfunction)    Full dentures    History of ETT    08-09-2014   fair exercise tolerance, no chest pain, normal BP response, no ST changes (baseline RBBB);  negative adequate ETT   Hyperlipidemia    Hypertension    Hypogonadism male    OA (osteoarthritis)    Right bundle branch block (  RBBB)    S/P CABG x 1    1998   Sigmoid diverticulosis    Ventral hernia    midline   Wears glasses     Past Surgical History:  Procedure Laterality Date   COLONOSCOPY WITH PROPOFOL  02-01-2012   CORONARY ARTERY BYPASS GRAFT  1998  at [facility]   x1  vessel    CYSTOSCOPY WITH BIOPSY N/A 10/08/2015   Procedure: CYSTOSCOPY WITH BLADDER BIOPSY;  Surgeon: Nickie Retort, MD;  Location: [facility];  Service: Urology;  Laterality: N/A;   CYSTOSCOPY WITH FULGERATION N/A 10/08/2015   Procedure: CYSTOSCOPY WITH FULGERATION;   Surgeon: Nickie Retort, MD;  Location: [facility];  Service: Urology;  Laterality: N/A;   CYSTOSCOPY WITH FULGERATION N/A 01/14/2022   Procedure: CYSTOSCOPY WITH FULGERATION;  Surgeon: Lucas Mallow, MD;  Location: [facility];  Service: Urology;  Laterality: N/A;   TONSILLECTOMY  age 54   TRANSURETHRAL RESECTION OF BLADDER TUMOR WITH GYRUS (TURBT-GYRUS) N/A 06/02/2015   Procedure: TRANSURETHRAL RESECTION OF BLADDER TUMOR WITH GYRUS (TURBT-GYRUS);  Surgeon: Nickie Retort, MD;  Location: [facility];  Service: Urology;  Laterality: N/A;     Home Medications:  Prior to Admission medications   Medication Sig Start Date End Date Taking? Authorizing Provider  amLODipine (NORVASC) 5 MG tablet TAKE 1 TABLET BY MOUTH  DAILY Patient taking differently: Take 5 mg by mouth daily. 06/01/21   Plotnikov, Evie Lacks, MD  aspirin 81 MG tablet Take 2 tablets (162 mg total) by mouth daily. Patient taking differently: Take 81 mg by mouth in the morning and at bedtime. 03/09/16   Plotnikov, Evie Lacks, MD  cholecalciferol (VITAMIN D) 25 MCG (1000 UT) tablet Take 1 tablet (1,000 Units total) by mouth every morning. Follow-up due in April must see provider for refills Patient not taking: Reported on 01/14/2022 01/23/18   Plotnikov, Evie Lacks, MD  Cholecalciferol (VITAMIN D3) 1000 units CAPS Take 1,000 Units by mouth daily with breakfast.    [provider]  Cinnamon 500 MG capsule Take 500 mg by mouth 3 (three) times a week.    [provider]  HYDROcodone-acetaminophen (NORCO/VICODIN) 5-325 MG tablet Take 1 tablet by mouth every 6 (six) hours as needed for moderate pain or severe pain. 01/13/22   [provider]  metFORMIN (GLUCOPHAGE-XR) 500 MG 24 hr tablet TAKE 1 TABLET BY MOUTH  TWICE DAILY Patient taking differently: Take 500 mg by mouth 2 (two) times daily with a meal. 06/09/21   Plotnikov, Evie Lacks, MD  methocarbamol (ROBAXIN) 500 MG tablet Take 1 tablet  (500 mg total) by mouth every 6 (six) hours as needed for muscle spasms. Patient not taking: Reported on 01/14/2022 07/04/19   Erline Levine, MD  oxyCODONE (OXY IR/ROXICODONE) 5 MG immediate release tablet Take 1 tablet (5 mg total) by mouth every 3 (three) hours as needed for moderate pain ((score 4 to 6)). Patient not taking: Reported on 01/14/2022 07/04/19   Erline Levine, MD  pravastatin (PRAVACHOL) 40 MG tablet TAKE 1 TABLET BY MOUTH DAILY Patient taking differently: Take 40 mg by mouth at bedtime. 09/09/21   Plotnikov, Evie Lacks, MD  telmisartan-hydrochlorothiazide (MICARDIS HCT) 80-25 MG tablet Take 1 tablet by mouth daily. 07/02/21   Plotnikov, Evie Lacks, MD  traMADol (ULTRAM) 50 MG tablet Take 50 mg by mouth every 6 (six) hours as needed (for pain).    [provider]  TYLENOL 500 MG tablet  Take 500-1,000 mg by mouth every 8 (eight) hours as needed (for pain).    [provider]    Inpatient Medications: Scheduled Meds:  amLODipine  5 mg Oral Daily   [START ON 02/03/2022] aspirin EC  81 mg Oral Daily   atorvastatin  80 mg Oral Daily   irbesartan  300 mg Oral Daily   And   hydrochlorothiazide  25 mg Oral Daily   pantoprazole (PROTONIX) IV  40 mg Intravenous Q24H   Continuous Infusions:  heparin 1,100 Units/hr (02/01/22 2158)   PRN Meds: acetaminophen, magnesium hydroxide, nitroGLYCERIN, ondansetron (ZOFRAN) IV, traZODone  Allergies:    Allergies  Allergen Reactions   Coreg [Carvedilol] Other (See Comments)    weakness   Lovastatin     Leg weakness   Morphine Sulfate Other (See Comments)    Per the pt: "After taking morphine, within 30 mins, I was soaking wet. I did not like the way it made me feel."    Social History:   Social History   Socioeconomic History   Marital status: Married    Spouse name: Not on file   Number of children: Not on file   Years of education: Not on file   Highest education level: Not on file  Occupational History   Occupation:  retired    Comment: but working fulltime  Tobacco Use   Smoking status: Former    Years: 15.00    Types: Cigarettes    Quit date: 01/18/1971    Years since quitting: 51.0   Smokeless tobacco: Current    Types: Snuff  Vaping Use   Vaping Use: Never used  Substance and Sexual Activity   Alcohol use: No   Drug use: No   Sexual activity: Not Currently  Other Topics Concern   Not on file  Social History Narrative   Regular exercise-yes   Social Determinants of Health   Financial Resource Strain: Low Risk  (05/18/2021)   Overall Financial Resource Strain (CARDIA)    Difficulty of Paying Living Expenses: Not hard at all  Food Insecurity: Unknown (02/02/2022)   Hunger Vital Sign    Worried About [place] in the Last Year: Patient refused    [place] in the Last Year: Patient refused  Transportation Needs: No Transportation Needs (02/02/2022)   PRAPARE - Hydrologist (Medical): No    Lack of Transportation (Non-Medical): No  Physical Activity: Sufficiently Active (05/18/2021)   Exercise Vital Sign    Days of Exercise per Week: 5 days    Minutes of Exercise per Session: 30 min  Stress: No Stress Concern Present (05/18/2021)   [place]    Feeling of Stress : Not at all  Social Connections: [place] (05/18/2021)   Social Connection and Isolation Panel [NHANES]    Frequency of Communication with Friends and Family: More than three times a week    Frequency of Social Gatherings with Friends and Family: More than three times a week    Attends Religious Services: More than 4 times per year    Active Member of [employer] or Organizations: Yes    Attends Archivist Meetings: More than 4 times per year    Marital Status: Married  Human resources officer Violence: Not At Risk (02/02/2022)   Humiliation, Afraid, Rape, and Kick questionnaire    Fear of Current or Ex-Partner: No     Emotionally Abused: No  Physically Abused: No    Sexually Abused: No    Family History:    Family History  Problem Relation Age of Onset   Arthritis Mother 36       RA   Alcohol abuse Father 57       exposure   Diabetes Brother    Kidney disease Brother    Heart disease Brother    Hypertension Other    Colon cancer Neg Hx    Esophageal cancer Neg Hx    Rectal cancer Neg Hx    Stomach cancer Neg Hx      ROS:  Please see the history of present illness.  All other ROS reviewed and negative.     Physical Exam/Data:   Vitals:   02/02/22 0332 02/02/22 0350 02/02/22 0400 02/02/22 0447  BP: (!) 154/70 (!) 140/64 133/64 (!) 166/77  Pulse: 62 60 (!) 55 76  Resp: '19 16 15 16  '$ Temp: 99.1 F (37.3 C)   97.8 F (36.6 C)  TempSrc: Oral   Oral  SpO2: 93% 93% 91% 97%  Weight:      Height:    '5\' 9"'$  (1.753 m)    Intake/Output Summary (Last 24 hours) at 02/02/2022 0725 Last data filed at 02/02/2022 0500 Gross per 24 hour  Intake 11 ml  Output 0 ml  Net 11 ml      02/01/2022    6:39 PM 01/14/2022   10:18 PM 01/14/2022   12:40 AM  Last 3 Weights  Weight (lbs) 200 lb 2.8 oz 200 lb 2.8 oz 184 lb 1.4 oz  Weight (kg) 90.8 kg 90.8 kg 83.5 kg     Body mass index is 29.56 kg/m.  General: Well developed, well nourished, in no acute distress. Head: Normocephalic, atraumatic, sclera non-icteric, no xanthomas, nares are without discharge. Neck: + right carotid bruit > left carotid bruit. JVP not elevated. Lungs: Clear bilaterally to auscultation without wheezes, rales, or rhonchi. Breathing is unlabored. Heart: RRR S1 S2 2/6 SEM without rubs or gallops.  Abdomen: Soft, non-tender, non-distended with normoactive bowel sounds. No rebound/guarding. Extremities: No clubbing or cyanosis. Trace BLE edema. Distal pedal pulses are 2+ and equal bilaterally. Neuro: Alert and oriented X 3. Moves all extremities spontaneously. Psych:  Responds to questions appropriately with a normal  affect.   EKG:  The EKG was personally reviewed and demonstrates:  NSR 89bpm occasional PVC, RBBB, LAD Telemetry:  Telemetry was personally reviewed and demonstrates:  NSR, occ PVC, one 5 beat run NSVT  Relevant CV Studies: ETT 2016  ETT with fair exercise tolerance; no chest pain; normal BP response; no ST changes (baseline RBBB); negative adequate ETT.   Laboratory Data:  High Sensitivity Troponin:   Recent Labs  Lab 02/01/22 1850 02/01/22 2129  TROPONINIHS 221* 276*     Chemistry Recent Labs  Lab 02/01/22 1850  NA 141  K 4.0  CL 106  CO2 24  GLUCOSE 203*  BUN 18  CREATININE 1.23  CALCIUM 9.2  GFRNONAA 58*  ANIONGAP 11    No results for input(s): "PROT", "ALBUMIN", "AST", "ALT", "ALKPHOS", "BILITOT" in the last 168 hours. Lipids No results for input(s): "CHOL", "TRIG", "HDL", "LABVLDL", "LDLCALC", "CHOLHDL" in the last 168 hours.  Hematology Recent Labs  Lab 02/01/22 1850 02/02/22 0511  WBC 9.4 8.1  RBC 4.01* 3.69*  HGB 11.9* 11.1*  HCT 35.8* 33.1*  MCV 89.3 89.7  MCH 29.7 30.1  MCHC 33.2 33.5  RDW 13.2 13.0  PLT 235 207  Thyroid No results for input(s): "TSH", "FREET4" in the last 168 hours.  BNPNo results for input(s): "BNP", "PROBNP" in the last 168 hours.  DDimer No results for input(s): "DDIMER" in the last 168 hours.   Radiology/Studies:  DG Chest 2 View  Result Date: 02/01/2022 CLINICAL DATA:  Chest pain since Saturday, especially LEFT chest after eating mild shortness of breath at times EXAM: CHEST - 2 VIEW COMPARISON:  CT chest 05/19/2015 FINDINGS: Normal heart size post median sternotomy. Mediastinal contours and pulmonary vascularity normal. Atherosclerotic calcification aorta. Small calcified pleural plaques RIGHT upper lobe, better demonstrated on prior CT. Lungs otherwise clear. No pulmonary infiltrate, pleural effusion, or pneumothorax. Osseous structures unremarkable. IMPRESSION: Chronic calcified pleural plaques RIGHT hemithorax; these  are BILATERAL on prior CT, favor asbestos exposure. No acute abnormalities. Electronically Signed   By: Lavonia Dana M.D.   On: 02/01/2022 19:43     Assessment and Plan:   1. NSTEMI/CAD - currently on ASA, heparin, atorvastatin (changed from home pravastatin) - difficult situation - symptoms sound consistent with angina though may have been precipitated in part due to drop in Hgb/elevated BP as well as possible valvular disease given murmur on exam, complicated by ongoing hematuria. Likely would benefit from cardiac cath this admission but need to clarify whether additional procedures are needed for his ongoing hematuria/malignancy before committing to uninterrupted DAPT if PCI were necessary - recommend primary team consult urology if not already done - f/u troponin this AM to peak - agree with echo - will otherwise discuss further recs with cards MD - addendum: per preliminary review with Dr. Angelena Form, would not rush into cath with ongoing hematuria - stop heparin, use SCD for DVT ppx - await medicine input regarding continuation of ASA (I confirmed with [facility] consult pager that they do have pt assigned to IM to see)  2. Gross hematuria with recent cystoscopy with high grade urothelial carcinoma - suggest medicine team consult urology for further recommendations  3. Essential HTN - manage in context above - SBP elevated at times, follow with present regimen, await echo to help guide med recs  4. Systolic murmur - agree with echo - concerned he may have aortic stenosis based on exam  5. DM - per IM  6. CKD stage 3b - appears at baseline, will need to follow while inpt  7. Occasional PVC, one run NSVT - prior intolerance of carvedilol due to weakness - HR 60s-70s, consider low dose metoprolol - check K, Mg this AM  8. LE edema - get echo and BNP - notably is on amlodipine   Risk Assessment/Risk Scores:     TIMI Risk Score for Unstable Angina or Non-ST Elevation MI:   The  patient's TIMI risk score is 6, which indicates a 41% risk of all cause mortality, new or recurrent myocardial infarction or need for urgent revascularization in the next 14 days.          For questions or updates, please contact [place] Please consult [email or web address] for contact info under    Signed, Charlie Pitter, PA-C  02/02/2022 7:25 AM  I have personally seen and examined this patient. I agree with the assessment and plan as outlined above.  87 yo male with CAD s/p remote CABG admitted with hematuria.  He has bladder cancer. Recent procedures.  He has had some chest pressure with exertion in setting of anemia.  Agree that an echo is indicated with his murmur. I would not plan any ischemic  evaluation at this time given his active urinary tract bleeding. OK to stop IV heparin.  We will follow along with you.   Lauree Chandler, MD, [facility] 02/02/2022 10:46 AM

## 2022-02-02 NOTE — Plan of Care (Signed)
  Problem: Education: Goal: Knowledge of the prescribed therapeutic regimen will improve Outcome: Progressing   Problem: Bowel/Gastric: Goal: Gastrointestinal status for postoperative course will improve Outcome: Progressing   Problem: Health Behavior/Discharge Planning: Goal: Identification of resources available to assist in meeting health care needs will improve Outcome: Progressing   Problem: Skin Integrity: Goal: Demonstration of wound healing without infection will improve Outcome: Progressing   Problem: Urinary Elimination: Goal: Ability to avoid or minimize complications of infection will improve Outcome: Progressing   Problem: Education: Goal: Knowledge of General Education information will improve Description: Including pain rating scale, medication(s)/side effects and non-pharmacologic comfort measures Outcome: Progressing   Problem: Health Behavior/Discharge Planning: Goal: Ability to manage health-related needs will improve Outcome: Progressing   Problem: Clinical Measurements: Goal: Ability to maintain clinical measurements within normal limits will improve Outcome: Progressing Goal: Will remain free from infection Outcome: Progressing Goal: Diagnostic test results will improve Outcome: Progressing Goal: Respiratory complications will improve Outcome: Progressing Goal: Cardiovascular complication will be avoided Outcome: Progressing   Problem: Activity: Goal: Risk for activity intolerance will decrease Outcome: Progressing   Problem: Nutrition: Goal: Adequate nutrition will be maintained Outcome: Progressing   Problem: Coping: Goal: Level of anxiety will decrease Outcome: Progressing   Problem: Elimination: Goal: Will not experience complications related to bowel motility Outcome: Progressing Goal: Will not experience complications related to urinary retention Outcome: Progressing   Problem: Pain Managment: Goal: General experience of comfort  will improve Outcome: Progressing   Problem: Safety: Goal: Ability to remain free from injury will improve Outcome: Progressing   Problem: Skin Integrity: Goal: Risk for impaired skin integrity will decrease Outcome: Progressing   Problem: Education: Goal: Understanding of cardiac disease, CV risk reduction, and recovery process will improve Outcome: Progressing Goal: Individualized Educational Video(s) Outcome: Progressing   Problem: Activity: Goal: Ability to tolerate increased activity will improve Outcome: Progressing   Problem: Cardiac: Goal: Ability to achieve and maintain adequate cardiovascular perfusion will improve Outcome: Progressing   Problem: Health Behavior/Discharge Planning: Goal: Ability to safely manage health-related needs after discharge will improve Outcome: Progressing

## 2022-02-02 NOTE — Telephone Encounter (Signed)
Pt states he is in the hospital now. He stated they trying to get urology over to see him and see why he is bleeding.

## 2022-02-02 NOTE — Consult Note (Signed)
I have been asked to see the patient by Dr. Wendee Beavers, for evaluation and management of gross hematuria.  History of present illness: 87 year old male presented to the emergency department with chest pain.  He was noted to have coronary artery disease/NSTEMI.  He was given a full dose aspirin and started on a heparin drip he subsequently developed gross hematuria and the heparin drip was stopped.  Urology was consulted for further management.  The patient has a history of bladder cancer.  He was first diagnosed in 2017.  He was somewhat lost to follow-up and represented December 2023.  At that time he was having recurrent gross hematuria.  Ultimately the patient underwent repeat transurethral resection of his bladder tumor for high-grade nonmuscle invasive bladder cancer.  His postoperative course was complicated by gross hematuria and occlusion of his Foley catheter.  Ultimately this was resolved and his catheter was removed.  He was having some postop dysuria, but this seemingly improved and he was voiding fairly clear urine.  His post surgery plans were to proceed with local BCG immunotherapy.  He was scheduled to start that in 2 weeks.  Interestingly, the patient states that before he ever got any anticoagulation with in the emergency department he noted that his urine was starting to darken up.  He also is complaining of dysuria.  Currently the patient is voiding on his own, without a catheter.  The urine was grossly bloody, but has now cleared.    Review of systems: A 12 point comprehensive review of systems was obtained and is negative unless otherwise stated in the history of present illness.  Patient Active Problem List   Diagnosis Date Noted   NSTEMI (non-ST elevated myocardial infarction) ([place]) 02/01/2022   Hematuria 01/14/2022   Wrist pain, left 10/26/2021   Lumbar foraminal stenosis 07/03/2019   Preop exam for internal medicine 06/25/2019   Ataxia 05/14/2019   Hip pain, bilateral  01/16/2018   Trochanteric bursitis 06/17/2017   Double vision 03/09/2016   Gait disorder 12/08/2015   Dysuria 10/28/2015   Bladder cancer ([place]) 08/13/2015   Sialoadenitis 01/01/2015   RBBB 07/12/2014   Pyogenic granuloma 02/06/2013   Well adult exam 11/11/2011   DM (diabetes mellitus), type 2 with renal complications ([place]) 81/19/1478   Hypogonadism male 03/10/2011   Fatigue 11/10/2010   Hyperkalemia 07/06/2010   Neoplasm of uncertain behavior of skin 10/14/2009   UNS ADVRS EFF UNS RX MEDICINAL&BIOLOGICAL SBSTNC 04/25/2008   SEBORRHEIC DERMATITIS 12/28/2007   Actinic keratosis 12/28/2007   Disorder resulting from impaired renal function 09/30/2006   LOW BACK PAIN 09/30/2006   Dyslipidemia 07/26/2006   Essential hypertension 07/26/2006   Coronary atherosclerosis 07/26/2006   Osteoarthritis 07/26/2006    No current facility-administered medications on file prior to encounter.   Current Outpatient Medications on File Prior to Encounter  Medication Sig Dispense Refill   amLODipine (NORVASC) 5 MG tablet TAKE 1 TABLET BY MOUTH  DAILY (Patient taking differently: Take 5 mg by mouth daily.) 90 tablet 3   aspirin 81 MG tablet Take 2 tablets (162 mg total) by mouth daily. (Patient taking differently: Take 81 mg by mouth in the morning and at bedtime.) 100 tablet 3   cholecalciferol (VITAMIN D) 25 MCG (1000 UT) tablet Take 1 tablet (1,000 Units total) by mouth every morning. Follow-up due in April must see provider for refills (Patient taking differently: Take 1,000 Units by mouth every morning.) 90 tablet 0   Cinnamon 500 MG capsule Take 200-500 mg by  mouth 3 (three) times a week.     HYDROcodone-acetaminophen (NORCO/VICODIN) 5-325 MG tablet Take 1 tablet by mouth every 6 (six) hours as needed for moderate pain or severe pain.     metFORMIN (GLUCOPHAGE-XR) 500 MG 24 hr tablet TAKE 1 TABLET BY MOUTH  TWICE DAILY (Patient taking differently: Take 500 mg by mouth 2 (two) times daily with a  meal.) 200 tablet 2   pravastatin (PRAVACHOL) 40 MG tablet TAKE 1 TABLET BY MOUTH DAILY (Patient taking differently: Take 40 mg by mouth at bedtime.) 100 tablet 2   telmisartan-hydrochlorothiazide (MICARDIS HCT) 80-25 MG tablet Take 1 tablet by mouth daily. 90 tablet 3   TYLENOL 500 MG tablet Take 500-1,000 mg by mouth every 8 (eight) hours as needed (for pain).      Past Medical History:  Diagnosis Date   Benign localized prostatic hyperplasia with lower urinary tract symptoms (LUTS)    Bladder cancer [facility]) dx 05/ 2017--  urologist- dr Pilar Jarvis   TCC , Ta of bladder  s/p TURBT 06-02-2015   CAD (coronary artery disease)    no cardiologist--  followed by pcp,  dr plontnikov   Chronic low back pain with sciatica    CKD (chronic kidney disease), stage III ([place])    Diabetes mellitus, type 2 ([place])    ED (erectile dysfunction)    Full dentures    History of ETT    08-09-2014   fair exercise tolerance, no chest pain, normal BP response, no ST changes (baseline RBBB);  negative adequate ETT   Hyperlipidemia    Hypertension    Hypogonadism male    OA (osteoarthritis)    Right bundle branch block (RBBB)    S/P CABG x 1    1998   Sigmoid diverticulosis    Ventral hernia    midline   Wears glasses     Past Surgical History:  Procedure Laterality Date   COLONOSCOPY WITH PROPOFOL  02-01-2012   CORONARY ARTERY BYPASS GRAFT  1998  at [facility]   x1  vessel    CYSTOSCOPY WITH BIOPSY N/A 10/08/2015   Procedure: CYSTOSCOPY WITH BLADDER BIOPSY;  Surgeon: Nickie Retort, MD;  Location: [facility];  Service: Urology;  Laterality: N/A;   CYSTOSCOPY WITH FULGERATION N/A 10/08/2015   Procedure: CYSTOSCOPY WITH FULGERATION;  Surgeon: Nickie Retort, MD;  Location: [facility];  Service: Urology;  Laterality: N/A;   CYSTOSCOPY WITH FULGERATION N/A 01/14/2022   Procedure: CYSTOSCOPY WITH FULGERATION;  Surgeon: Lucas Mallow, MD;  Location: [facility];   Service: Urology;  Laterality: N/A;   TONSILLECTOMY  age 32   TRANSURETHRAL RESECTION OF BLADDER TUMOR WITH GYRUS (TURBT-GYRUS) N/A 06/02/2015   Procedure: TRANSURETHRAL RESECTION OF BLADDER TUMOR WITH GYRUS (TURBT-GYRUS);  Surgeon: Nickie Retort, MD;  Location: [facility];  Service: Urology;  Laterality: N/A;    Social History   Tobacco Use   Smoking status: Former    Years: 15.00    Types: Cigarettes    Quit date: 01/18/1971    Years since quitting: 51.0   Smokeless tobacco: Current    Types: Snuff  Vaping Use   Vaping Use: Never used  Substance Use Topics   Alcohol use: No   Drug use: No    Family History  Problem Relation Age of Onset   Arthritis Mother 21       RA   Alcohol abuse Father 67  exposure   Diabetes Brother    Kidney disease Brother    Heart disease Brother    Hypertension Other    Colon cancer Neg Hx    Esophageal cancer Neg Hx    Rectal cancer Neg Hx    Stomach cancer Neg Hx     PE: Vitals:   02/02/22 0447 02/02/22 0744 02/02/22 1053 02/02/22 1439  BP: (!) 166/77 116/75 (!) 176/80 133/60  Pulse: 76 72  78  Resp: '16 18  18  '$ Temp: 97.8 F (36.6 C) 97.8 F (36.6 C)  98.1 F (36.7 C)  TempSrc: Oral Oral  Oral  SpO2: 97% 95%  95%  Weight:      Height: '5\' 9"'$  (1.753 m)      Patient appears to be in no acute distress  patient is alert and oriented x3 Atraumatic normocephalic head No cervical or supraclavicular lymphadenopathy appreciated No increased work of breathing, no audible wheezes/rhonchi Regular sinus rhythm/rate Abdomen is soft, nontender, nondistended, no CVA or suprapubic tenderness Lower extremities are symmetric without appreciable edema Grossly neurologically intact No identifiable skin lesions  Recent Labs    02/01/22 1850 02/02/22 0511 02/02/22 1056  WBC 9.4 8.1  --   HGB 11.9* 11.1* 11.3*  HCT 35.8* 33.1* 33.7*   Recent Labs    02/01/22 1850 02/02/22 1056  NA 141 140  K 4.0 3.7  CL 106 110   CO2 24 24  GLUCOSE 203* 132*  BUN 18 14  CREATININE 1.23 1.23  CALCIUM 9.2 8.4*   No results for input(s): "LABPT", "INR" in the last 72 hours. No results for input(s): "LABURIN" in the last 72 hours. Results for orders placed or performed during the hospital encounter of 01/14/22  Urine Culture     Status: None   Collection Time: 01/14/22  4:40 AM   Specimen: Urine, Clean Catch  Result Value Ref Range Status   Specimen Description   Final    URINE, CLEAN CATCH Performed at [facility], [place] [address], [place], [place] [address]    Special Requests   Final    NONE Performed at [facility], [place] [address], [place], [place] [address]    Culture   Final    NO GROWTH Performed at [facility], [place] [address], [place], [place] [address]    Report Status 01/15/2022 FINAL  Final    Imaging: none  Imp:  #1 - The patient has high-grade nonmuscle invasive bladder cancer, with plans to manage his cancer with localized immunotherapy via bladder installations weekly.  His prognosis and life expectancy from this is very good.  #2 - The patient developed gross hematuria which is likely a combination of incomplete healing from the recent surgery and possible cystitis exacerbated by the initiation of a heparin drip.  Once the heparin drip was stopped his urine cleared.  At this point his urine is back to normal.  Recommendations: The patient I along with his family members discussed management strategies.  I think it be worth starting him on Bactrim, Cipro, or Augmentin which would all be good options for his potential urine pathogens in the event that if he is part of his bleeding was exacerbated by a UTI.  We also discussed placing a three-way Foley catheter empirically in the event that he does need anticoagulation, we could easily start continuous bladder irrigation quickly if he does develop gross hematuria.   Ideally we would give him a few  more  days to heal and to treat any infection, this would certainly lower his risk for developing gross hematuria.  Currently the patient's does not have a Foley catheter, but if ultimately he does need to be taken to the Cath Lab and does develop gross hematuria he will need a three-way Foley catheter and to be started on continuous bladder irrigation.  I have started him on Augmentin, would recommend 7 day course.    I will alert Dr. Gloriann Loan, his primary urologist, to his admission.  Ardis Hughs

## 2022-02-02 NOTE — ED Notes (Signed)
UA sent for testing. Urine appear amber to pink. No obviously clotting. Pt noticed color change today while in lobby waiting.

## 2022-02-02 NOTE — H&P (Signed)
History and Physical    Jeff Burgess DQQ:[id number] DOB: 11/28/1935 DOA: 02/01/2022  PCP: Jeff Anger, MD Patient coming from: Home.  Independently ambulates at baseline.  Chief Complaint: Chest pain  HPI: Jeff Burgess is a 87 year old M with PMH of CAD/CABG in 1998, NIDDM-2, HTN, chronic back pain, bladder cancer s/p TURBT on 01/15/2022 presenting with intermittent chest pain for 2 days.  Patient reports brief episode of chest pain after eating on Saturday and Monday, each lasted about 4 to 5 minutes.  Describes the pain as tightness over his left chest.  He thought it could be indigestion.  Pain is worse with exertion.  No radiation to his arms or jaws.  No associated dyspnea, diaphoresis, lightheadedness, nausea or vomiting.  Denies fever, chills, cough, orthopnea, PND or edema.  Last pain was about 4 PM yesterday.  He has dysuria that has improved since he had bladder surgery.  He noted some hematuria while in ED after he was started on IV heparin.  He takes low-dose aspirin.  Not on any other blood thinners.  Patient denies smoking cigarette but reports using dip.  Denies drinking alcohol or recreational drug use.  He states he is DNR/DNI.  In ED, hypertensive to 191/84.  Other vital stable.  Basic labs including BMP and CBC without significant finding other than hyperglycemia.  Troponin 221>> 276.  EKG sinus rhythm with PAC and RBBB.  CXR without acute finding.  UA with hematuria.  Patient was started on IV heparin.  Cardiology consulted.  Admission accepted by overnight admitted.    ROS All review of system negative except for pertinent positives and negatives as history of present illness above.  PMH Past Medical History:  Diagnosis Date   Benign localized prostatic hyperplasia with lower urinary tract symptoms (LUTS)    Bladder cancer [facility]) dx 05/ 2017--  urologist- dr Pilar Jarvis   TCC , Ta of bladder  s/p TURBT 06-02-2015   CAD (coronary artery disease)    no  cardiologist--  followed by pcp,  dr plontnikov   Chronic low back pain with sciatica    CKD (chronic kidney disease), stage III ([place])    Diabetes mellitus, type 2 ([place])    ED (erectile dysfunction)    Full dentures    History of ETT    08-09-2014   fair exercise tolerance, no chest pain, normal BP response, no ST changes (baseline RBBB);  negative adequate ETT   Hyperlipidemia    Hypertension    Hypogonadism male    OA (osteoarthritis)    Right bundle branch block (RBBB)    S/P CABG x 1    1998   Sigmoid diverticulosis    Ventral hernia    midline   Wears glasses    PSH Past Surgical History:  Procedure Laterality Date   COLONOSCOPY WITH PROPOFOL  02-01-2012   CORONARY ARTERY BYPASS GRAFT  1998  at [facility]   x1  vessel    CYSTOSCOPY WITH BIOPSY N/A 10/08/2015   Procedure: CYSTOSCOPY WITH BLADDER BIOPSY;  Surgeon: Nickie Retort, MD;  Location: [facility];  Service: Urology;  Laterality: N/A;   CYSTOSCOPY WITH FULGERATION N/A 10/08/2015   Procedure: CYSTOSCOPY WITH FULGERATION;  Surgeon: Nickie Retort, MD;  Location: [facility];  Service: Urology;  Laterality: N/A;   CYSTOSCOPY WITH FULGERATION N/A 01/14/2022   Procedure: CYSTOSCOPY WITH FULGERATION;  Surgeon: Lucas Mallow, MD;  Location[facility];  Service: Urology;  Laterality: N/A;   TONSILLECTOMY  age 54   TRANSURETHRAL RESECTION OF BLADDER TUMOR WITH GYRUS (TURBT-GYRUS) N/A 06/02/2015   Procedure: TRANSURETHRAL RESECTION OF BLADDER TUMOR WITH GYRUS (TURBT-GYRUS);  Surgeon: Nickie Retort, MD;  Location: [facility];  Service: Urology;  Laterality: N/A;   Fam HX Family History  Problem Relation Age of Onset   Arthritis Mother 80       RA   Alcohol abuse Father 22       exposure   Diabetes Brother    Kidney disease Brother    Heart disease Brother    Hypertension Other    Colon cancer Neg Hx    Esophageal cancer Neg Hx    Rectal cancer Neg Hx     Stomach cancer Neg Hx     Social Hx  reports that he quit smoking about 51 years ago. His smoking use included cigarettes. His smokeless tobacco use includes snuff. He reports that he does not drink alcohol and does not use drugs.  Allergy Allergies  Allergen Reactions   Coreg [Carvedilol] Other (See Comments)    weakness   Lovastatin     Leg weakness   Morphine Sulfate Other (See Comments)    Per the pt: "After taking morphine, within 30 mins, I was soaking wet. I did not like the way it made me feel."   Home Meds Prior to Admission medications   Medication Sig Start Date End Date Taking? Authorizing Provider  amLODipine (NORVASC) 5 MG tablet TAKE 1 TABLET BY MOUTH  DAILY Patient taking differently: Take 5 mg by mouth daily. 06/01/21   Plotnikov, Evie Lacks, MD  aspirin 81 MG tablet Take 2 tablets (162 mg total) by mouth daily. Patient taking differently: Take 81 mg by mouth in the morning and at bedtime. 03/09/16   Plotnikov, Evie Lacks, MD  cholecalciferol (VITAMIN D) 25 MCG (1000 UT) tablet Take 1 tablet (1,000 Units total) by mouth every morning. Follow-up due in April must see provider for refills Patient not taking: Reported on 01/14/2022 01/23/18   Plotnikov, Evie Lacks, MD  Cholecalciferol (VITAMIN D3) 1000 units CAPS Take 1,000 Units by mouth daily with breakfast.    [provider]  Cinnamon 500 MG capsule Take 500 mg by mouth 3 (three) times a week.    [provider]  HYDROcodone-acetaminophen (NORCO/VICODIN) 5-325 MG tablet Take 1 tablet by mouth every 6 (six) hours as needed for moderate pain or severe pain. 01/13/22   [provider]  metFORMIN (GLUCOPHAGE-XR) 500 MG 24 hr tablet TAKE 1 TABLET BY MOUTH  TWICE DAILY Patient taking differently: Take 500 mg by mouth 2 (two) times daily with a meal. 06/09/21   Plotnikov, Evie Lacks, MD  methocarbamol (ROBAXIN) 500 MG tablet Take 1 tablet (500 mg total) by mouth every 6 (six) hours as needed for muscle  spasms. Patient not taking: Reported on 01/14/2022 07/04/19   Erline Levine, MD  oxyCODONE (OXY IR/ROXICODONE) 5 MG immediate release tablet Take 1 tablet (5 mg total) by mouth every 3 (three) hours as needed for moderate pain ((score 4 to 6)). Patient not taking: Reported on 01/14/2022 07/04/19   Erline Levine, MD  pravastatin (PRAVACHOL) 40 MG tablet TAKE 1 TABLET BY MOUTH DAILY Patient taking differently: Take 40 mg by mouth at bedtime. 09/09/21   Plotnikov, Evie Lacks, MD  telmisartan-hydrochlorothiazide (MICARDIS HCT) 80-25 MG tablet Take 1 tablet by mouth daily. 07/02/21   Plotnikov, Evie Lacks, MD  traMADol (ULTRAM) 50 MG  tablet Take 50 mg by mouth every 6 (six) hours as needed (for pain).    [provider]  TYLENOL 500 MG tablet Take 500-1,000 mg by mouth every 8 (eight) hours as needed (for pain).    [provider]    Physical Exam: Vitals:   02/02/22 0400 02/02/22 0447 02/02/22 0744 02/02/22 1053  BP: 133/64 (!) 166/77 116/75 (!) 176/80  Pulse: (!) 55 76 72   Resp: '15 16 18   '$ Temp:  97.8 F (36.6 C) 97.8 F (36.6 C)   TempSrc:  Oral Oral   SpO2: 91% 97% 95%   Weight:      Height:  '5\' 9"'$  (1.753 m)      GENERAL: No acute distress.  Appears well.  HEENT: MMM.  Vision and hearing grossly intact.  NECK: Supple.  No apparent JVD.  RESP:  No IWOB. Good air movement bilaterally. CVS:  RRR . Heart sounds normal.  ABD/GI/GU: Bowel sounds present. Soft. Non tender.  MSK/EXT:  Moves extremities. No apparent deformity or edema.  SKIN: no apparent skin lesion or wound NEURO: Awake, alert and oriented appropriately.  No gross deficit.  PSYCH: Calm. Normal affect.    Personally Reviewed Radiological Exams See HPI   Personally Reviewed Labs: CBC: Recent Labs  Lab 02/01/22 1850 02/02/22 0511 02/02/22 1056  WBC 9.4 8.1  --   HGB 11.9* 11.1* 11.3*  HCT 35.8* 33.1* 33.7*  MCV 89.3 89.7  --   PLT 235 207  --    Basic Metabolic Panel: Recent Labs  Lab  02/01/22 1850  NA 141  K 4.0  CL 106  CO2 24  GLUCOSE 203*  BUN 18  CREATININE 1.23  CALCIUM 9.2   GFR: Estimated Creatinine Clearance: 49.8 mL/min (by C-G formula based on SCr of 1.23 mg/dL). Liver Function Tests: No results for input(s): "AST", "ALT", "ALKPHOS", "BILITOT", "PROT", "ALBUMIN" in the last 168 hours. No results for input(s): "LIPASE", "AMYLASE" in the last 168 hours. No results for input(s): "AMMONIA" in the last 168 hours. Coagulation Profile: No results for input(s): "INR", "PROTIME" in the last 168 hours. Cardiac Enzymes: No results for input(s): "CKTOTAL", "CKMB", "CKMBINDEX", "TROPONINI" in the last 168 hours. BNP (last 3 results) No results for input(s): "PROBNP" in the last 8760 hours. HbA1C: No results for input(s): "HGBA1C" in the last 72 hours. CBG: No results for input(s): "GLUCAP" in the last 168 hours. Lipid Profile: No results for input(s): "CHOL", "HDL", "LDLCALC", "TRIG", "CHOLHDL", "LDLDIRECT" in the last 72 hours. Thyroid Function Tests: No results for input(s): "TSH", "T4TOTAL", "FREET4", "T3FREE", "THYROIDAB" in the last 72 hours. Anemia Panel: No results for input(s): "VITAMINB12", "FOLATE", "FERRITIN", "TIBC", "IRON", "RETICCTPCT" in the last 72 hours. Urine analysis:    Component Value Date/Time   COLORURINE YELLOW 02/02/2022 0159   APPEARANCEUR HAZY (A) 02/02/2022 0159   LABSPEC 1.019 02/02/2022 0159   PHURINE 5.5 02/02/2022 0159   GLUCOSEU NEGATIVE 02/02/2022 0159   GLUCOSEU NEGATIVE 01/22/2021 1108   HGBUR LARGE (A) 02/02/2022 0159   BILIRUBINUR NEGATIVE 02/02/2022 0159   BILIRUBINUR neg 10/28/2015 0839   KETONESUR NEGATIVE 02/02/2022 0159   PROTEINUR 30 (A) 02/02/2022 0159   UROBILINOGEN 0.2 01/22/2021 1108   NITRITE NEGATIVE 02/02/2022 0159   LEUKOCYTESUR MODERATE (A) 02/02/2022 0159     Assessment and plan:  Principal Problem:   NSTEMI (non-ST elevated myocardial infarction) ([place]) Active Problems:   Essential  hypertension   LOW BACK PAIN   DM (diabetes mellitus), type 2 with  renal complications ([facility])   RBBB   Bladder cancer ([place])   Hematuria   Non-STEMI in patient with history of CAD/CABG: Suspicious history.  Presents with intermittent postprandial chest pain although worse with exertion.  Troponin elevated with significant delta and uptrending.  EKG features sinus rhythm with PACs and old RBBB.  Currently chest pain-free.  Patient was started on IV heparin which is discontinued due to gross hematuria. -Follow echocardiogram -Continue low-dose aspirin. -Follow-up further recommendation by cardiology  Gross hematuria/bladder cancer s/p TURBT on 01/15/2021.  Patient and nursing staff reports gross hematuria after started IV heparin in ED.  I did not see urine sample in urinal this morning: H&H relatively stable now Recent Labs    10/26/21 1134 01/14/22 0258 02/01/22 1850 02/02/22 0511 02/02/22 1056  HGB 14.6 12.2* 11.9* 11.1* 11.3*  -IV heparin discontinued pending urology input. -Urology consulted  Uncontrolled NIDDM-2 with hyperglycemia: A1c 7.0% in 10/2021.  On metformin at home. -Recheck hemoglobin A1c -Start SSI  Uncontrolled hypertension: BP elevated to 190/84 on arrival.  Improved. -Continue home antihypertensive meds including amlodipine, HCTZ, Avapro -May hold starting beta-blocker   Goal of care counseling: Confirmed DNR/DNI with patient.  DVT prophylaxis: SCD in the setting for gross hematuria  Code Status: DNR/DNI Family Communication: None at bedside  Consults called: Cardiology, urology Admission status: Inpatient  Severity of Illness: The appropriate patient status for this patient is INPATIENT. Inpatient status is judged to be reasonable and necessary in order to provide the required intensity of service to ensure the patient's safety. The patient's presenting symptoms, physical exam findings, and initial radiographic and laboratory data in the context of their  chronic comorbidities is felt to place them at high risk for further clinical deterioration. Furthermore, it is not anticipated that the patient will be medically stable for discharge from the hospital within 2 midnights of admission.       Mercy Riding MD [facility]  If 7PM-7AM, please contact night-coverage [email or web address]  02/02/2022, 11:40 AM

## 2022-02-02 NOTE — ED Notes (Signed)
[facility] has arrived to transport patient to [facility].  6E notified that patient will be arriving soon.  Patient belongings sent with [facility].  He has his glasses one and he has a large wooden walking stick that will go as well.

## 2022-02-02 NOTE — Progress Notes (Signed)
  Echocardiogram 2D Echocardiogram has been performed.  Jeff Burgess 02/02/2022, 10:54 AM

## 2022-02-02 NOTE — Plan of Care (Signed)
  Problem: Education: Goal: Knowledge of the prescribed therapeutic regimen will improve Outcome: Progressing   Problem: Bowel/Gastric: Goal: Gastrointestinal status for postoperative course will improve Outcome: Progressing   Problem: Health Behavior/Discharge Planning: Goal: Identification of resources available to assist in meeting health care needs will improve Outcome: Progressing   Problem: Skin Integrity: Goal: Demonstration of wound healing without infection will improve Outcome: Progressing   Problem: Urinary Elimination: Goal: Ability to avoid or minimize complications of infection will improve Outcome: Progressing   Problem: Education: Goal: Knowledge of General Education information will improve Description: Including pain rating scale, medication(s)/side effects and non-pharmacologic comfort measures Outcome: Progressing   Problem: Health Behavior/Discharge Planning: Goal: Ability to manage health-related needs will improve Outcome: Progressing   Problem: Clinical Measurements: Goal: Ability to maintain clinical measurements within normal limits will improve Outcome: Progressing Goal: Will remain free from infection Outcome: Progressing Goal: Diagnostic test results will improve Outcome: Progressing Goal: Respiratory complications will improve Outcome: Progressing Goal: Cardiovascular complication will be avoided Outcome: Progressing   Problem: Activity: Goal: Risk for activity intolerance will decrease Outcome: Progressing   Problem: Nutrition: Goal: Adequate nutrition will be maintained Outcome: Progressing   Problem: Coping: Goal: Level of anxiety will decrease Outcome: Progressing   Problem: Elimination: Goal: Will not experience complications related to bowel motility Outcome: Progressing Goal: Will not experience complications related to urinary retention Outcome: Progressing   Problem: Pain Managment: Goal: General experience of comfort  will improve Outcome: Progressing   Problem: Safety: Goal: Ability to remain free from injury will improve Outcome: Progressing   Problem: Skin Integrity: Goal: Risk for impaired skin integrity will decrease Outcome: Progressing   Problem: Education: Goal: Understanding of cardiac disease, CV risk reduction, and recovery process will improve Outcome: Progressing Goal: Individualized Educational Video(s) Outcome: Progressing   Problem: Activity: Goal: Ability to tolerate increased activity will improve Outcome: Progressing   Problem: Cardiac: Goal: Ability to achieve and maintain adequate cardiovascular perfusion will improve Outcome: Progressing   Problem: Health Behavior/Discharge Planning: Goal: Ability to safely manage health-related needs after discharge will improve Outcome: Progressing   Problem: Education: Goal: Ability to describe self-care measures that may prevent or decrease complications (Diabetes Survival Skills Education) will improve Outcome: Progressing Goal: Individualized Educational Video(s) Outcome: Progressing   Problem: Coping: Goal: Ability to adjust to condition or change in health will improve Outcome: Progressing   Problem: Fluid Volume: Goal: Ability to maintain a balanced intake and output will improve Outcome: Progressing   Problem: Health Behavior/Discharge Planning: Goal: Ability to identify and utilize available resources and services will improve Outcome: Progressing Goal: Ability to manage health-related needs will improve Outcome: Progressing   Problem: Metabolic: Goal: Ability to maintain appropriate glucose levels will improve Outcome: Progressing   Problem: Nutritional: Goal: Maintenance of adequate nutrition will improve Outcome: Progressing Goal: Progress toward achieving an optimal weight will improve Outcome: Progressing   Problem: Skin Integrity: Goal: Risk for impaired skin integrity will decrease Outcome:  Progressing   Problem: Tissue Perfusion: Goal: Adequacy of tissue perfusion will improve Outcome: Progressing

## 2022-02-02 NOTE — Progress Notes (Signed)
K 3.7, Mg 1.8. Given PVCs/NSVT seen earlier this admission, will supplement and recheck in AM.

## 2022-02-02 NOTE — Telephone Encounter (Signed)
It is best to go to ER if he is having active chest pains.  Thank you

## 2022-02-02 NOTE — ED Notes (Signed)
Patient now has gross hematuria.  Dr. Dina Rich notified and will contact the admitting for further orders.  [facility] aware and receiving RN made aware of the same.

## 2022-02-03 ENCOUNTER — Other Ambulatory Visit (HOSPITAL_COMMUNITY): Payer: Self-pay

## 2022-02-03 DIAGNOSIS — I214 Non-ST elevation (NSTEMI) myocardial infarction: Secondary | ICD-10-CM | POA: Diagnosis not present

## 2022-02-03 DIAGNOSIS — I350 Nonrheumatic aortic (valve) stenosis: Secondary | ICD-10-CM

## 2022-02-03 DIAGNOSIS — I25708 Atherosclerosis of coronary artery bypass graft(s), unspecified, with other forms of angina pectoris: Secondary | ICD-10-CM | POA: Diagnosis not present

## 2022-02-03 LAB — LIPID PANEL
Cholesterol: 83 mg/dL (ref 0–200)
HDL: 26 mg/dL — ABNORMAL LOW (ref >40)
LDL Cholesterol: 39 mg/dL (ref 0–99)
Total CHOL/HDL Ratio: 3.2 RATIO
Triglycerides: 89 mg/dL (ref <150)
VLDL: 18 mg/dL (ref 0–40)

## 2022-02-03 LAB — BASIC METABOLIC PANEL
Anion gap: 7 (ref 5–15)
BUN: 14 mg/dL (ref 8–23)
CO2: 22 mmol/L (ref 22–32)
Calcium: 8.3 mg/dL — ABNORMAL LOW (ref 8.9–10.3)
Chloride: 107 mmol/L (ref 98–111)
Creatinine, Ser: 1.28 mg/dL — ABNORMAL HIGH (ref 0.61–1.24)
GFR, Estimated: 55 mL/min — ABNORMAL LOW (ref >60)
Glucose, Bld: 126 mg/dL — ABNORMAL HIGH (ref 70–99)
Potassium: 3.3 mmol/L — ABNORMAL LOW (ref 3.5–5.1)
Sodium: 136 mmol/L (ref 135–145)

## 2022-02-03 LAB — GLUCOSE, CAPILLARY
Glucose-Capillary: 137 mg/dL — ABNORMAL HIGH (ref 70–99)
Glucose-Capillary: 140 mg/dL — ABNORMAL HIGH (ref 70–99)

## 2022-02-03 LAB — CBC
HCT: 31.7 % — ABNORMAL LOW (ref 39.0–52.0)
Hemoglobin: 10.6 g/dL — ABNORMAL LOW (ref 13.0–17.0)
MCH: 29.6 pg (ref 26.0–34.0)
MCHC: 33.4 g/dL (ref 30.0–36.0)
MCV: 88.5 fL (ref 80.0–100.0)
Platelets: 196 10*3/uL (ref 150–400)
RBC: 3.58 MIL/uL — ABNORMAL LOW (ref 4.22–5.81)
RDW: 13 % (ref 11.5–15.5)
WBC: 9.2 10*3/uL (ref 4.0–10.5)
nRBC: 0 % (ref 0.0–0.2)

## 2022-02-03 LAB — MAGNESIUM: Magnesium: 2.1 mg/dL (ref 1.7–2.4)

## 2022-02-03 LAB — LIPOPROTEIN A (LPA): Lipoprotein (a): 30.2 nmol/L — ABNORMAL HIGH (ref <75.0)

## 2022-02-03 MED ORDER — ISOSORBIDE MONONITRATE ER 30 MG PO TB24
30.0000 mg | ORAL_TABLET | Freq: Every day | ORAL | Status: DC
  Administered 2022-02-03: 30 mg via ORAL
  Filled 2022-02-03 (×2): qty 1

## 2022-02-03 MED ORDER — POTASSIUM CHLORIDE CRYS ER 20 MEQ PO TBCR
40.0000 meq | EXTENDED_RELEASE_TABLET | ORAL | Status: AC
  Administered 2022-02-03 (×2): 40 meq via ORAL
  Filled 2022-02-03 (×2): qty 2

## 2022-02-03 MED ORDER — ISOSORBIDE MONONITRATE ER 30 MG PO TB24
30.0000 mg | ORAL_TABLET | Freq: Every day | ORAL | 1 refills | Status: DC
  Filled 2022-02-03: qty 30, 30d supply, fill #0

## 2022-02-03 MED ORDER — AMOXICILLIN-POT CLAVULANATE 875-125 MG PO TABS
1.0000 | ORAL_TABLET | Freq: Two times a day (BID) | ORAL | 0 refills | Status: AC
  Filled 2022-02-03: qty 12, 6d supply, fill #0

## 2022-02-03 MED ORDER — ATORVASTATIN CALCIUM 80 MG PO TABS
80.0000 mg | ORAL_TABLET | Freq: Every day | ORAL | 1 refills | Status: DC
  Filled 2022-02-03: qty 30, 30d supply, fill #0

## 2022-02-03 NOTE — TOC Initial Note (Signed)
Transition of Care [facility]) - Initial/Assessment Note    Patient Details  Name: Jeff Burgess MRN: [id number] Date of Birth: 1935/11/29  Transition of Care [facility]) CM/SW Contact:    Ninfa Meeker, RN Phone Number: 02/03/2022, 12:24 PM  Clinical Narrative:                 Transition of Care Screening Note:  Transition of Care [facility]) Department has reviewed patient and no TOC needs have been identified at this time. We will continue to monitor patient advancement through Interdisciplinary progressions and if new patient needs arise, please place a consult.        Patient Goals and CMS Choice            Expected Discharge Plan and Services                                              Prior Living Arrangements/Services                       Activities of Daily Living Home Assistive Devices/Equipment: Cane (specify quad or straight) ADL Screening (condition at time of admission) Patient's cognitive ability adequate to safely complete daily activities?: Yes Is the patient deaf or have difficulty hearing?: Yes Does the patient have difficulty seeing, even when wearing glasses/contacts?: No Does the patient have difficulty concentrating, remembering, or making decisions?: No Patient able to express need for assistance with ADLs?: Yes Does the patient have difficulty dressing or bathing?: No Independently performs ADLs?: Yes (appropriate for developmental age) Does the patient have difficulty walking or climbing stairs?: Yes Weakness of Legs: Both Weakness of Arms/Hands: None  Permission Sought/Granted                  Emotional Assessment              Admission diagnosis:  NSTEMI (non-ST elevated myocardial infarction) [facility]) [I21.4] Patient Active Problem List   Diagnosis Date Noted   NSTEMI (non-ST elevated myocardial infarction) ([place]) 02/01/2022   Hematuria 01/14/2022   Wrist pain, left 10/26/2021   Lumbar foraminal stenosis  07/03/2019   Preop exam for internal medicine 06/25/2019   Ataxia 05/14/2019   Hip pain, bilateral 01/16/2018   Trochanteric bursitis 06/17/2017   Double vision 03/09/2016   Gait disorder 12/08/2015   Dysuria 10/28/2015   Bladder cancer ([place]) 08/13/2015   Sialoadenitis 01/01/2015   RBBB 07/12/2014   Pyogenic granuloma 02/06/2013   Well adult exam 11/11/2011   DM (diabetes mellitus), type 2 with renal complications ([place]) 37/16/9678   Hypogonadism male 03/10/2011   Fatigue 11/10/2010   Hyperkalemia 07/06/2010   Neoplasm of uncertain behavior of skin 10/14/2009   UNS ADVRS EFF UNS RX MEDICINAL&BIOLOGICAL SBSTNC 04/25/2008   SEBORRHEIC DERMATITIS 12/28/2007   Actinic keratosis 12/28/2007   Disorder resulting from impaired renal function 09/30/2006   LOW BACK PAIN 09/30/2006   Dyslipidemia 07/26/2006   Essential hypertension 07/26/2006   Coronary atherosclerosis 07/26/2006   Osteoarthritis 07/26/2006   PCP:  Cassandria Anger, MD Pharmacy:   [place], [place] - [address] [place] [address] Phone: [phone] Fax: [phone]  [id number] [id number]: [phone]: [phone]    Social  Determinants of Health ([place]) Social History: SDOH Screenings   Food Insecurity: Unknown (02/02/2022)  Housing: Low Risk  (02/02/2022)  Transportation Needs: No Transportation Needs (02/02/2022)  Utilities: Not At Risk (02/02/2022)  Alcohol Screen: Low Risk  (05/18/2021)  Depression (PHQ2-9): Low Risk  (05/18/2021)  Financial Resource Strain: Low Risk  (05/18/2021)  Physical Activity: Sufficiently Active (05/18/2021)  Social Connections: Socially Integrated (05/18/2021)  Stress: No Stress Concern Present (05/18/2021)  Tobacco Use: High Risk (02/01/2022)   SDOH Interventions: Housing Interventions: Patient Refused   Readmission  Risk Interventions     No data to display

## 2022-02-03 NOTE — Progress Notes (Signed)
CARDIAC REHAB PHASE I   Pt resting in bed, wife at bedside. Feeling well today, reports oob to bathroom and around room with no CP or SOB. Post MI education including MI booklet, restrictions, risk factors, exercise guidelines, heart healthy diet and CRP2 reviewed. All questions and concerns addressed. Will refer to [facility]  for [place]. Plan for possible discharge home today.   1300-1330  Vanessa Barbara, RN BSN 02/03/2022 1:24 PM

## 2022-02-03 NOTE — Discharge Summary (Signed)
Physician Discharge Summary  Jeff Burgess UEK:[id number] DOB: 1935-10-01 DOA: 02/01/2022  PCP: Cassandria Anger, MD  Admit date: 02/01/2022 Discharge date: 02/03/2022  Time spent: 40 minutes  Recommendations for Outpatient Follow-up:  Follow outpatient CBC/CMP  Follow with urology outpatient for his hematuria Follow with cardiology outpatient for NSTEMI/chest pain Follow blood pressure outpatient  Follow hemoglobin outpatient, trend    Discharge Diagnoses:  Principal Problem:   NSTEMI (non-ST elevated myocardial infarction) ([place]) Active Problems:   Essential hypertension   LOW BACK PAIN   DM (diabetes mellitus), type 2 with renal complications ([place])   RBBB   Bladder cancer ([place])   Hematuria   Discharge Condition: stable  Diet recommendation: heart healthy, diabetic  Filed Weights   02/01/22 1839  Weight: 90.8 kg    History of present illness:  Jeff Burgess is Jeff Burgess 87 year old M with PMH of CAD/CABG in 1998, NIDDM-2, HTN, chronic back pain, bladder cancer s/p TURBT on 01/15/2022 presenting with intermittent chest pain for 2 days.   Patient reports brief episode of chest pain after eating on Saturday and Monday, each lasted about 4 to 5 minutes.  Describes the pain as tightness over his left chest.  He thought it could be indigestion.  Pain is worse with exertion.  No radiation to his arms or jaws.  No associated dyspnea, diaphoresis, lightheadedness, nausea or vomiting.  Denies fever, chills, cough, orthopnea, PND or edema.  Last pain was about 4 PM yesterday.  He has dysuria that has improved since he had bladder surgery.  He noted some hematuria while in ED after he was started on IV heparin.  He takes low-dose aspirin.  Not on any other blood thinners.   Patient denies smoking cigarette but reports using dip.  Denies drinking alcohol or recreational drug use.  He states he is DNR/DNI.   In ED, hypertensive to 191/84.  Other vital stable.  Basic labs including BMP and  CBC without significant finding other than hyperglycemia.  Troponin 221>> 276.  EKG sinus rhythm with PAC and RBBB.  CXR without acute finding.  UA with hematuria.  Patient was started on IV heparin.  Cardiology consulted.  Admission accepted by overnight admitted.   He was seen by cardiology for his elevated troponin and chest pain.  Suspicion was presentation related to demand ischemia.  Hematuria developed after heparin started, resolved with discontinuation, also suspected to have UTI.    See below for additional details  Hospital Course:  Assessment and Plan: Non-STEMI in patient with history of CAD/CABG:  Troponin peaked at 276 EKG features sinus rhythm with PACs and old RBBB.  Currently chest pain-free.  Patient was started on IV heparin which is discontinued due to gross hematuria. Echo with normal EF, moderate AS Continue aspirin.  Start imdur.  Holding off on beta blocker for now with hx weakness with coreg.  Started on atorvastatin (stop pravastatin). Appreciate cardiology assistance, suspected demand ischemia.  Planning for outpatient follow up.   Gross hematuria/bladder cancer s/p TURBT on 01/15/2022  Hemorrhagic Cystitis.  Patient and nursing staff reports gross hematuria after started IV heparin in ED.  I did not see urine sample in urinal this morning: H&H relatively stable now -IV heparin discontinued -Urology consulted - recommending treating for UTI with augmentin.  Per discussion, they're ok with aspirin.   - follow outpatient    Anemia - relatively stable in setting of hematuria above - follow outpatient   Uncontrolled NIDDM-2 with hyperglycemia: A1c 7.0% in 10/2021.  On metformin at home. -Recheck hemoglobin A1c 6.9 -resume metformin at discharge   Uncontrolled hypertension: BP elevated to 190/84 on arrival.  Improved. -BP meds improved, continue home BP meds at discharge     Goal of care counseling: Confirmed DNR/DNI with  patient.     Procedures: Echo IMPRESSIONS     1. Left ventricular ejection fraction, by estimation, is 55%. The left  ventricle has low normal function. There is mild left ventricular  hypertrophy. Left ventricular diastolic parameters are indeterminate.   2. Right ventricular systolic function is normal. The right ventricular  size is normal. There is normal pulmonary artery systolic pressure.   3. The mitral valve is normal in structure. Mild mitral valve  regurgitation.   4. AV is thickened, calcified with restricted motion. Peak and mean  gradients through the valve are 45 and 23 mm Hg respectively AVA (VTI) is  1.2 cm2 DVI is 0.35 consistent with moderate AS. [name] The aortic valve is  tricuspid. Aortic valve regurgitation is   mild.   5. The inferior vena cava is normal in size with greater than 50%  respiratory variability, suggesting right atrial pressure of 3 mmHg.    Consultations: none  Discharge Exam: Vitals:   02/03/22 0900 02/03/22 1244  BP: (!) 143/68 115/61  Pulse: 78 68  Resp:  20  Temp: 99 F (37.2 C) 98.6 F (37 C)  SpO2: 95% 96%   No complaints Eager for discharge home  General: No acute distress. Cardiovascular: Heart sounds show Jeff Burgess regular rate, and rhythm.  Lungs: Clear to auscultation bilaterally  Abdomen: Soft, nontender, nondistended  Neurological: Alert and oriented 3. Moves all extremities 4. Cranial nerves II through XII grossly intact. Extremities: No clubbing or cyanosis. No edema.   Discharge Instructions   Discharge Instructions     Amb Referral to Cardiac Rehabilitation   Complete by: As directed    Diagnosis: NSTEMI   After initial evaluation and assessments completed: Virtual Based Care may be provided alone or in conjunction with Phase 2 Cardiac Rehab based on patient barriers.: Yes   Intensive Cardiac Rehabilitation ([place]) [place] location only OR Traditional Cardiac Rehabilitation (TCR) *If criteria for ICR are not met will  enroll in TCR [facility] only): Yes   Call MD for:  difficulty breathing, headache or visual disturbances   Complete by: As directed    Call MD for:  extreme fatigue   Complete by: As directed    Call MD for:  hives   Complete by: As directed    Call MD for:  persistant dizziness or light-headedness   Complete by: As directed    Call MD for:  persistant nausea and vomiting   Complete by: As directed    Call MD for:  redness, tenderness, or signs of infection (pain, swelling, redness, odor or green/yellow discharge around incision site)   Complete by: As directed    Call MD for:  severe uncontrolled pain   Complete by: As directed    Call MD for:  temperature >100.4   Complete by: As directed    Diet - low sodium heart healthy   Complete by: As directed    Discharge instructions   Complete by: As directed    You were seen for chest pain and elevated troponins.  Cardiology suspects this is related to "demand ischemia".  You developed hematuria (blood in your urine) related to blood thinners.   You're being treated for Whitley Patchen urinary tract infection.  You'll discharge with  augmentin for another 6 days.  Continue aspirin alone for now.  Please follow up with cardiology outpatient as well for your chest pain and elevated troponins.  Return for new, recurrent, or worsening symptoms.  Please ask your PCP to request records from this hospitalization so they know what was done and what the next steps will be.   Increase activity slowly   Complete by: As directed       Allergies as of 02/03/2022       Reactions   Coreg [carvedilol] Other (See Comments)   weakness   Lovastatin    Leg weakness   Morphine Sulfate Other (See Comments)   Per the pt: "After taking morphine, within 30 mins, I was soaking wet. I did not like the way it made me feel."        Medication List     STOP taking these medications    pravastatin 40 MG tablet Commonly known as: PRAVACHOL       TAKE these  medications    amLODipine 5 MG tablet Commonly known as: NORVASC TAKE 1 TABLET BY MOUTH  DAILY   amoxicillin-clavulanate 875-125 MG tablet Commonly known as: AUGMENTIN Take 1 tablet by mouth every 12 (twelve) hours for 6 days.   aspirin 81 MG tablet Take 2 tablets (162 mg total) by mouth daily. What changed:  how much to take when to take this   atorvastatin 80 MG tablet Commonly known as: LIPITOR Take 1 tablet (80 mg total) by mouth daily. Start taking on: February 04, 2022   cholecalciferol 25 MCG (1000 UNIT) tablet Commonly known as: VITAMIN D3 Take 1 tablet (1,000 Units total) by mouth every morning. Follow-up due in April must see provider for refills What changed: additional instructions   Cinnamon 500 MG capsule Take 200-500 mg by mouth 3 (three) times Ranita Stjulien week.   HYDROcodone-acetaminophen 5-325 MG tablet Commonly known as: NORCO/VICODIN Take 1 tablet by mouth every 6 (six) hours as needed for moderate pain or severe pain.   isosorbide mononitrate 30 MG 24 hr tablet Commonly known as: IMDUR Take 1 tablet (30 mg total) by mouth daily. Start taking on: February 04, 2022   metFORMIN 500 MG 24 hr tablet Commonly known as: GLUCOPHAGE-XR TAKE 1 TABLET BY MOUTH  TWICE DAILY What changed: when to take this   telmisartan-hydrochlorothiazide 80-25 MG tablet Commonly known as: MICARDIS HCT Take 1 tablet by mouth daily.   TYLENOL 500 MG tablet Generic drug: acetaminophen Take 500-1,000 mg by mouth every 8 (eight) hours as needed (for pain).       Allergies  Allergen Reactions   Coreg [Carvedilol] Other (See Comments)    weakness   Lovastatin     Leg weakness   Morphine Sulfate Other (See Comments)    Per the pt: "After taking morphine, within 30 mins, I was soaking wet. I did not like the way it made me feel."    Follow-up Information     Elgie Collard, PA-C Follow up.   Specialty: Cardiology Why: [place] location - cardiology follow-up  arranged with Thursday Feb 11, 2022 10:55 AM (Arrive by 10:40 AM). Johann Capers is one of our PAs with our cardiology team. Contact information: [address] [place]  [address] [phone]         Plotnikov, Evie Lacks, MD Follow up.   Specialty: Internal Medicine Contact information: [place] [address] [phone]  The results of significant diagnostics from this hospitalization (including imaging, microbiology, ancillary and laboratory) are listed below for reference.    Significant Diagnostic Studies: ECHOCARDIOGRAM COMPLETE  Result Date: 02/02/2022    ECHOCARDIOGRAM REPORT   Patient Name:   CERRONE DEBOLD Date of Exam: 02/02/2022 Medical Rec #:  [id number]        Height:       69.0 in Accession #:    [id number]       Weight:       200.2 lb Date of Birth:  [birth year]         BSA:          2.067 m Patient Age:    87 years         BP:           155/70 mmHg Patient Gender: M                HR:           66 bpm. Exam Location:  Inpatient Procedure: 2D Echo, Cardiac Doppler and Color Doppler Indications:    Acute myocardial infarction, unspecified I21.9  History:        Patient has no prior history of Echocardiogram examinations.                 STEMI and CAD, Prior CABG; Risk Factors:Former Smoker,                 Hypertension and Diabetes.  Sonographer:    Greer Pickerel Referring Phys: [id number] JAN Floyd Wade MANSY  Sonographer Comments: Image acquisition challenging due to patient body habitus and Image acquisition challenging due to respiratory motion. IMPRESSIONS  1. Left ventricular ejection fraction, by estimation, is 55%. The left ventricle has low normal function. There is mild left ventricular hypertrophy. Left ventricular diastolic parameters are indeterminate.  2. Right ventricular systolic function is normal. The right ventricular size is normal. There is normal pulmonary artery systolic pressure.  3. The mitral valve is normal in structure.  Mild mitral valve regurgitation.  4. AV is thickened, calcified with restricted motion. Peak and mean gradients through the valve are 45 and 23 mm Hg respectively AVA (VTI) is 1.2 cm2 DVI is 0.35 consistent with moderate AS. [name] The aortic valve is tricuspid. Aortic valve regurgitation is  mild.  5. The inferior vena cava is normal in size with greater than 50% respiratory variability, suggesting right atrial pressure of 3 mmHg. FINDINGS  Left Ventricle: Left ventricular ejection fraction, by estimation, is 55%. The left ventricle has low normal function. The left ventricular internal cavity size was normal in size. There is mild left ventricular hypertrophy. Left ventricular diastolic parameters are indeterminate. Right Ventricle: The right ventricular size is normal. Right vetricular wall thickness was not assessed. Right ventricular systolic function is normal. There is normal pulmonary artery systolic pressure. The tricuspid regurgitant velocity is 1.34 m/s, and with an assumed right atrial pressure of 8 mmHg, the estimated right ventricular systolic pressure is 51.0 mmHg. Left Atrium: Left atrial size was normal in size. Right Atrium: Right atrial size was normal in size. Pericardium: Trivial pericardial effusion is present. Mitral Valve: The mitral valve is normal in structure. Mild mitral valve regurgitation. Tricuspid Valve: The tricuspid valve is normal in structure. Tricuspid valve regurgitation is trivial. Aortic Valve: AV is thickened, calcified with restricted motion. Peak and mean gradients through the valve are 45 and 23 mm Hg respectively AVA (VTI) is 1.2 cm2 DVI is 0.35  consistent with moderate AS. The aortic valve is tricuspid. Aortic valve regurgitation is mild. Aortic regurgitation PHT measures 442 msec. Aortic valve mean gradient measures 25.0 mmHg. Aortic valve peak gradient measures 41.3 mmHg. Aortic valve area, by VTI measures 1.21 cm. Pulmonic Valve: The pulmonic valve was grossly normal.  Pulmonic valve regurgitation is trivial. Aorta: The aortic root and ascending aorta are structurally normal, with no evidence of dilitation. Venous: The inferior vena cava is normal in size with greater than 50% respiratory variability, suggesting right atrial pressure of 3 mmHg. IAS/Shunts: No atrial level shunt detected by color flow Doppler.  LEFT VENTRICLE PLAX 2D LVIDd:         5.60 cm      Diastology LVIDs:         4.50 cm      LV e' medial:    5.28 cm/s LV PW:         1.30 cm      LV E/e' medial:  16.3 LV IVS:        1.00 cm      LV e' lateral:   6.37 cm/s LVOT diam:     2.10 cm      LV E/e' lateral: 13.5 LV SV:         89 LV SV Index:   43 LVOT Area:     3.46 cm  LV Volumes (MOD) LV vol d, MOD A2C: 105.0 ml LV vol d, MOD A4C: 127.0 ml LV vol s, MOD A2C: 70.3 ml LV vol s, MOD A4C: 72.2 ml LV SV MOD A2C:     34.7 ml LV SV MOD A4C:     127.0 ml LV SV MOD BP:      45.5 ml RIGHT VENTRICLE RV S prime:     8.70 cm/s TAPSE (M-mode): 1.3 cm LEFT ATRIUM             Index        RIGHT ATRIUM           Index LA diam:        4.30 cm 2.08 cm/m   RA Area:     22.30 cm LA Vol (A2C):   60.2 ml 29.13 ml/m  RA Volume:   65.50 ml  31.69 ml/m LA Vol (A4C):   80.0 ml 38.71 ml/m LA Biplane Vol: 71.0 ml 34.35 ml/m  AORTIC VALVE AV Area (Vmax):    1.21 cm AV Area (Vmean):   1.10 cm AV Area (VTI):     1.21 cm AV Vmax:           321.50 cm/s AV Vmean:          230.500 cm/s AV VTI:            0.742 m AV Peak Grad:      41.3 mmHg AV Mean Grad:      25.0 mmHg LVOT Vmax:         112.00 cm/s LVOT Vmean:        72.900 cm/s LVOT VTI:          0.258 m LVOT/AV VTI ratio: 0.35 AI PHT:            442 msec  AORTA Ao Root diam: 3.20 cm Ao Asc diam:  2.90 cm MITRAL VALVE               TRICUSPID VALVE MV Area (PHT): 3.48 cm    TR Peak grad:   7.2 mmHg MV Decel Time:  218 msec    TR Vmax:        134.00 cm/s MV E velocity: 86.20 cm/s MV Ashly Yepez velocity: 93.10 cm/s  SHUNTS MV E/Mosiah Bastin ratio:  0.93        Systemic VTI:  0.26 m                             Systemic Diam: 2.10 cm Dorris Carnes MD Electronically signed by Dorris Carnes MD Signature Date/Time: 02/02/2022/11:38:22 AM    Final    DG Chest 2 View  Result Date: 02/01/2022 CLINICAL DATA:  Chest pain since Saturday, especially LEFT chest after eating mild shortness of breath at times EXAM: CHEST - 2 VIEW COMPARISON:  CT chest 05/19/2015 FINDINGS: Normal heart size post median sternotomy. Mediastinal contours and pulmonary vascularity normal. Atherosclerotic calcification aorta. Small calcified pleural plaques RIGHT upper lobe, better demonstrated on prior CT. Lungs otherwise clear. No pulmonary infiltrate, pleural effusion, or pneumothorax. Osseous structures unremarkable. IMPRESSION: Chronic calcified pleural plaques RIGHT hemithorax; these are BILATERAL on prior CT, favor asbestos exposure. No acute abnormalities. Electronically Signed   By: Lavonia Dana M.D.   On: 02/01/2022 19:43    Microbiology: No results found for this or any previous visit (from the past 240 hour(s)).   Labs: Basic Metabolic Panel: Recent Labs  Lab 02/01/22 1850 02/02/22 1056 02/03/22 0616  NA 141 140 136  K 4.0 3.7 3.3*  CL 106 110 107  CO2 '24 24 22  '$ GLUCOSE 203* 132* 126*  BUN '18 14 14  '$ CREATININE 1.23 1.23 1.28*  CALCIUM 9.2 8.4* 8.3*  MG  --  1.8 2.1   Liver Function Tests: No results for input(s): "AST", "ALT", "ALKPHOS", "BILITOT", "PROT", "ALBUMIN" in the last 168 hours. No results for input(s): "LIPASE", "AMYLASE" in the last 168 hours. No results for input(s): "AMMONIA" in the last 168 hours. CBC: Recent Labs  Lab 02/01/22 1850 02/02/22 0511 02/02/22 1056 02/02/22 1704 02/02/22 2245 02/03/22 0616  WBC 9.4 8.1  --   --   --  9.2  HGB 11.9* 11.1* 11.3* 10.8* 11.4* 10.6*  HCT 35.8* 33.1* 33.7* 31.1* 33.8* 31.7*  MCV 89.3 89.7  --   --   --  88.5  PLT 235 207  --   --   --  196   Cardiac Enzymes: No results for input(s): "CKTOTAL", "CKMB", "CKMBINDEX", "TROPONINI" in the last 168  hours. BNP: BNP (last 3 results) Recent Labs    02/02/22 0510  BNP 363.6*    ProBNP (last 3 results) No results for input(s): "PROBNP" in the last 8760 hours.  CBG: Recent Labs  Lab 02/02/22 1231 02/02/22 1637 02/03/22 0807 02/03/22 1136  GLUCAP 157* 126* 137* 140*       Signed:  Fayrene Helper MD.  [facility] 02/03/2022, 2:03 PM

## 2022-02-03 NOTE — Progress Notes (Addendum)
Progress Note  Patient Name: Jeff Burgess Date of Encounter: 02/03/2022  Primary Cardiologist: None  Subjective   Feeling well, no recurrent CP. No SOB.  Inpatient Medications    Scheduled Meds:  amLODipine  5 mg Oral Daily   amoxicillin-clavulanate  1 tablet Oral Q12H   aspirin EC  81 mg Oral Daily   atorvastatin  80 mg Oral Daily   irbesartan  300 mg Oral Daily   And   hydrochlorothiazide  25 mg Oral Daily   insulin aspart  0-5 Units Subcutaneous QHS   insulin aspart  0-9 Units Subcutaneous TID WC   isosorbide mononitrate  30 mg Oral Daily   pantoprazole (PROTONIX) IV  40 mg Intravenous Q24H   potassium chloride  40 mEq Oral Q4H   Continuous Infusions:  PRN Meds: acetaminophen, HYDROcodone-acetaminophen, magnesium hydroxide, nitroGLYCERIN, ondansetron (ZOFRAN) IV, traZODone   Vital Signs    Vitals:   02/02/22 1439 02/02/22 2026 02/03/22 0051 02/03/22 0441  BP: 133/60 138/68 (!) 121/59 (!) 141/78  Pulse: 78 79 80 93  Resp: '18 20 16 18  '$ Temp: 98.1 F (36.7 C) 98 F (36.7 C) 98 F (36.7 C) 99.6 F (37.6 C)  TempSrc: Oral Oral Oral Oral  SpO2: 95% 95%  95%  Weight:      Height:        Intake/Output Summary (Last 24 hours) at 02/03/2022 0911 Last data filed at 02/02/2022 1800 Gross per 24 hour  Intake 1660 ml  Output 200 ml  Net 1460 ml      02/01/2022    6:39 PM 01/14/2022   10:18 PM 01/14/2022   12:40 AM  Last 3 Weights  Weight (lbs) 200 lb 2.8 oz 200 lb 2.8 oz 184 lb 1.4 oz  Weight (kg) 90.8 kg 90.8 kg 83.5 kg     Telemetry    NSR, occasional PVC - Personally Reviewed  Physical Exam   GEN: No acute distress.  HEENT: Normocephalic, atraumatic, sclera non-icteric. Neck: No JVD or bruits. Cardiac: RRR 2/6 SEM at RUSB and LSB. No rubs or gallops.  Respiratory: Clear to auscultation bilaterally. Breathing is unlabored. GI: Soft, nontender, non-distended, BS +x 4. MS: no deformity. Extremities: No clubbing or cyanosis. No edema. Distal pedal  pulses are 2+ and equal bilaterally. Neuro:  AAOx3. Follows commands. Psych:  Responds to questions appropriately with a normal affect.  Labs    High Sensitivity Troponin:   Recent Labs  Lab 02/01/22 1850 02/01/22 2129 02/02/22 1056  TROPONINIHS 221* 276* 265*      Cardiac EnzymesNo results for input(s): "TROPONINI" in the last 168 hours. No results for input(s): "TROPIPOC" in the last 168 hours.   Chemistry Recent Labs  Lab 02/01/22 1850 02/02/22 1056 02/03/22 0616  NA 141 140 136  K 4.0 3.7 3.3*  CL 106 110 107  CO2 '24 24 22  '$ GLUCOSE 203* 132* 126*  BUN '18 14 14  '$ CREATININE 1.23 1.23 1.28*  CALCIUM 9.2 8.4* 8.3*  GFRNONAA 58* 58* 55*  ANIONGAP '11 6 7     '$ Hematology Recent Labs  Lab 02/01/22 1850 02/02/22 0511 02/02/22 1056 02/02/22 1704 02/02/22 2245 02/03/22 0616  WBC 9.4 8.1  --   --   --  9.2  RBC 4.01* 3.69*  --   --   --  3.58*  HGB 11.9* 11.1*   < > 10.8* 11.4* 10.6*  HCT 35.8* 33.1*   < > 31.1* 33.8* 31.7*  MCV 89.3 89.7  --   --   --  88.5  MCH 29.7 30.1  --   --   --  29.6  MCHC 33.2 33.5  --   --   --  33.4  RDW 13.2 13.0  --   --   --  13.0  PLT 235 207  --   --   --  196   < > = values in this interval not displayed.    BNP Recent Labs  Lab 02/02/22 0510  BNP 363.6*     DDimer No results for input(s): "DDIMER" in the last 168 hours.   Radiology    ECHOCARDIOGRAM COMPLETE  Result Date: 02/02/2022    ECHOCARDIOGRAM REPORT   Patient Name:   Jeff Burgess Date of Exam: 02/02/2022 Medical Rec #:  [id number]        Height:       69.0 in Accession #:    [id number]       Weight:       200.2 lb Date of Birth:  09-Dec-1935         BSA:          2.067 m Patient Age:    87 years         BP:           155/70 mmHg Patient Gender: M                HR:           66 bpm. Exam Location:  Inpatient Procedure: 2D Echo, Cardiac Doppler and Color Doppler Indications:    Acute myocardial infarction, unspecified I21.9  History:        Patient has no prior  history of Echocardiogram examinations.                 STEMI and CAD, Prior CABG; Risk Factors:Former Smoker,                 Hypertension and Diabetes.  Sonographer:    Greer Pickerel Referring Phys: [id number] JAN A MANSY  Sonographer Comments: Image acquisition challenging due to patient body habitus and Image acquisition challenging due to respiratory motion. IMPRESSIONS  1. Left ventricular ejection fraction, by estimation, is 55%. The left ventricle has low normal function. There is mild left ventricular hypertrophy. Left ventricular diastolic parameters are indeterminate.  2. Right ventricular systolic function is normal. The right ventricular size is normal. There is normal pulmonary artery systolic pressure.  3. The mitral valve is normal in structure. Mild mitral valve regurgitation.  4. AV is thickened, calcified with restricted motion. Peak and mean gradients through the valve are 45 and 23 mm Hg respectively AVA (VTI) is 1.2 cm2 DVI is 0.35 consistent with moderate AS. [name] The aortic valve is tricuspid. Aortic valve regurgitation is  mild.  5. The inferior vena cava is normal in size with greater than 50% respiratory variability, suggesting right atrial pressure of 3 mmHg. FINDINGS  Left Ventricle: Left ventricular ejection fraction, by estimation, is 55%. The left ventricle has low normal function. The left ventricular internal cavity size was normal in size. There is mild left ventricular hypertrophy. Left ventricular diastolic parameters are indeterminate. Right Ventricle: The right ventricular size is normal. Right vetricular wall thickness was not assessed. Right ventricular systolic function is normal. There is normal pulmonary artery systolic pressure. The tricuspid regurgitant velocity is 1.34 m/s, and with an assumed right atrial pressure of 8 mmHg, the estimated right ventricular systolic pressure is 96.2 mmHg. Left Atrium: Left atrial size  was normal in size. Right Atrium: Right atrial size was  normal in size. Pericardium: Trivial pericardial effusion is present. Mitral Valve: The mitral valve is normal in structure. Mild mitral valve regurgitation. Tricuspid Valve: The tricuspid valve is normal in structure. Tricuspid valve regurgitation is trivial. Aortic Valve: AV is thickened, calcified with restricted motion. Peak and mean gradients through the valve are 45 and 23 mm Hg respectively AVA (VTI) is 1.2 cm2 DVI is 0.35 consistent with moderate AS. The aortic valve is tricuspid. Aortic valve regurgitation is mild. Aortic regurgitation PHT measures 442 msec. Aortic valve mean gradient measures 25.0 mmHg. Aortic valve peak gradient measures 41.3 mmHg. Aortic valve area, by VTI measures 1.21 cm. Pulmonic Valve: The pulmonic valve was grossly normal. Pulmonic valve regurgitation is trivial. Aorta: The aortic root and ascending aorta are structurally normal, with no evidence of dilitation. Venous: The inferior vena cava is normal in size with greater than 50% respiratory variability, suggesting right atrial pressure of 3 mmHg. IAS/Shunts: No atrial level shunt detected by color flow Doppler.  LEFT VENTRICLE PLAX 2D LVIDd:         5.60 cm      Diastology LVIDs:         4.50 cm      LV e' medial:    5.28 cm/s LV PW:         1.30 cm      LV E/e' medial:  16.3 LV IVS:        1.00 cm      LV e' lateral:   6.37 cm/s LVOT diam:     2.10 cm      LV E/e' lateral: 13.5 LV SV:         89 LV SV Index:   43 LVOT Area:     3.46 cm  LV Volumes (MOD) LV vol d, MOD A2C: 105.0 ml LV vol d, MOD A4C: 127.0 ml LV vol s, MOD A2C: 70.3 ml LV vol s, MOD A4C: 72.2 ml LV SV MOD A2C:     34.7 ml LV SV MOD A4C:     127.0 ml LV SV MOD BP:      45.5 ml RIGHT VENTRICLE RV S prime:     8.70 cm/s TAPSE (M-mode): 1.3 cm LEFT ATRIUM             Index        RIGHT ATRIUM           Index LA diam:        4.30 cm 2.08 cm/m   RA Area:     22.30 cm LA Vol (A2C):   60.2 ml 29.13 ml/m  RA Volume:   65.50 ml  31.69 ml/m LA Vol (A4C):   80.0 ml  38.71 ml/m LA Biplane Vol: 71.0 ml 34.35 ml/m  AORTIC VALVE AV Area (Vmax):    1.21 cm AV Area (Vmean):   1.10 cm AV Area (VTI):     1.21 cm AV Vmax:           321.50 cm/s AV Vmean:          230.500 cm/s AV VTI:            0.742 m AV Peak Grad:      41.3 mmHg AV Mean Grad:      25.0 mmHg LVOT Vmax:         112.00 cm/s LVOT Vmean:        72.900 cm/s LVOT VTI:  0.258 m LVOT/AV VTI ratio: 0.35 AI PHT:            442 msec  AORTA Ao Root diam: 3.20 cm Ao Asc diam:  2.90 cm MITRAL VALVE               TRICUSPID VALVE MV Area (PHT): 3.48 cm    TR Peak grad:   7.2 mmHg MV Decel Time: 218 msec    TR Vmax:        134.00 cm/s MV E velocity: 86.20 cm/s MV A velocity: 93.10 cm/s  SHUNTS MV E/A ratio:  0.93        Systemic VTI:  0.26 m                            Systemic Diam: 2.10 cm Dorris Carnes MD Electronically signed by Dorris Carnes MD Signature Date/Time: 02/02/2022/11:38:22 AM    Final    DG Chest 2 View  Result Date: 02/01/2022 CLINICAL DATA:  Chest pain since Saturday, especially LEFT chest after eating mild shortness of breath at times EXAM: CHEST - 2 VIEW COMPARISON:  CT chest 05/19/2015 FINDINGS: Normal heart size post median sternotomy. Mediastinal contours and pulmonary vascularity normal. Atherosclerotic calcification aorta. Small calcified pleural plaques RIGHT upper lobe, better demonstrated on prior CT. Lungs otherwise clear. No pulmonary infiltrate, pleural effusion, or pneumothorax. Osseous structures unremarkable. IMPRESSION: Chronic calcified pleural plaques RIGHT hemithorax; these are BILATERAL on prior CT, favor asbestos exposure. No acute abnormalities. Electronically Signed   By: Lavonia Dana M.D.   On: 02/01/2022 19:43    Cardiac Studies   2d echo 02/02/22   1. Left ventricular ejection fraction, by estimation, is 55%. The left  ventricle has low normal function. There is mild left ventricular  hypertrophy. Left ventricular diastolic parameters are indeterminate.   2. Right  ventricular systolic function is normal. The right ventricular  size is normal. There is normal pulmonary artery systolic pressure.   3. The mitral valve is normal in structure. Mild mitral valve  regurgitation.   4. AV is thickened, calcified with restricted motion. Peak and mean  gradients through the valve are 45 and 23 mm Hg respectively AVA (VTI) is  1.2 cm2 DVI is 0.35 consistent with moderate AS. [name] The aortic valve is  tricuspid. Aortic valve regurgitation is   mild.   5. The inferior vena cava is normal in size with greater than 50%  respiratory variability, suggesting right atrial pressure of 3 mmHg.   Patient Profile     87 y.o. male with CAD s/p CABG 1998, heart murmur (no prior echo available), bladder CA, CKD stage 3a, ED, DM, HTN, HLD, diverticulosis, RBBB, mild carotid artery disease (1-39% BICA in 2018), abnormal ABIs 2019 (duplex with total occlusion of R posterior tib and L anterior tib). He has had recent issues with recurrent hematuria and underwent underwent cystoscopy with clot evacuation and fulguration by urology 01/14/22. 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 He presented with generalized weakness and 2 episodes of chest pain in the setting of progressive anemia and HTN. Troponins were elevated c/w NSTEMI. Echo showed normal EF with moderate AS, mild MR/AI.  Assessment & Plan    1. NSTEMI/CAD - heparin d/c'd due to gross hematuria - continue ASA if OK with urology - continue atorvastatin (changed from home pravastatin), will need to f/u labs as OP - difficult situation - symptoms sound consistent with  angina though may have been precipitated in part due to drop in Hgb/elevated BP as well as possible valvular disease given murmur on exam, complicated by ongoing hematuria. Per d/w Dr. Angelena Form, plan medical therapy at this time due to bleeding risk - 2D echo EF 55%, mild LVH, mild MR, moderate AS, mild AI - will add  Imdur and consult cardiac rehab phase 1 for med management of NSTEMI - will not add Plavix at this time given bleeding risk - will hold off BB given prior h/o weakness with carvedilol and plan to add Imdur first, can consider as OP if needed   2. Gross hematuria with recent cystoscopy with high grade urothelial carcinoma - per urology   3. Essential HTN - manage in context above   4. Moderate AS, mild AI, mild MR - anticipate repeat echo in 1 year, can be arranged as OP   5. DM - per IM   6. CKD stage 3b - appears at baseline, will need to follow while inpt   7. Occasional PVC, one run NSVT - prior intolerance of carvedilol due to weakness - HR 60s-70s - see above re: MD holding off BB at present time - recommend keeping K 4.0 or greater, Mg 2.0 or greater (pharmD has entered K repletion, may need at home as well given HCTZ use)   8. LE edema - improved, may be side effect of amlodipine though BNP marginally elevated - with drop in Hgb would hold off diuresis   Have tentatively arranged f/u on 2/1  For questions or updates, please contact [facility] Please consult [email or web address] for contact info under Cardiology/STEMI.  Signed, Charlie Pitter, PA-C 02/03/2022, 9:11 AM    I have personally seen and examined this patient. I agree with the assessment and plan as outlined above. Chest pressure at home in setting of HTN and anemia. Mild troponin elevation with flat trend likely demand ischemia. Echo with normal LV function. Moderate AS.  No plans for further cardiac workup at this time. He can be discharged home from a cardiac perspective. We will arrange f/u in our office.   Lauree Chandler, MD, [facility] 02/03/2022 9:51 AM

## 2022-02-04 ENCOUNTER — Telehealth: Payer: Self-pay | Admitting: *Deleted

## 2022-02-04 ENCOUNTER — Encounter: Payer: Self-pay | Admitting: *Deleted

## 2022-02-04 NOTE — Patient Outreach (Addendum)
  Care Coordination TOC Note Transition Care Management Follow-up Telephone Call Date of discharge and from where: Wednesday, 02/03/22, [place]; NSTEMI How have you been since you were released from the hospital? "I am doing great-- much, much better.  I am able to take care of everything I need to to care for myself.  My wife is here and helping me with anything I might need, but so far I am okay and doing fine.  I got all of my new medicines and am taking them just like they told me to." Any questions or concerns? No  Items Reviewed: Did the pt receive and understand the discharge instructions provided? Yes - thoroughly reviewed all instructions on AVS with patient during TOC call today Medications obtained and verified? Yes - full medication review completed; no concerns or discrepancies identified; patient reports he self-manages medications with occasional assistance from his spouse; verbalizes good general understanding of the purpose/ scheduling/ dosing of medications and he denies questions/ concerns today Other? No  Any new allergies since your discharge? No  Dietary orders reviewed? Yes Do you have support at home? Yes  patient reports he is independent in self-care activities; his spouse assists as/ if indicated  Home Care and Equipment/Supplies: Were home health services ordered? no If so, what is the name of the agency? N/A  Has the agency set up a time to come to the patient's home? not applicable Were any new equipment or medical supplies ordered?  No What is the name of the medical supply agency? N/A Were you able to get the supplies/equipment? not applicable Do you have any questions related to the use of the equipment or supplies? No N/A  Functional Questionnaire: (I = Independent and D = Dependent) ADLs: I  Bathing/Dressing- I  Meal Prep- I  Eating- I  Maintaining continence- I  Transferring/Ambulation- I  Managing Meds- I  Follow up appointments  reviewed:  PCP Hospital f/u appt confirmed? Yes  Scheduled to see PCP on Wednesday, 02/17/22 @ 9:30 am Specialist Hospital f/u appt confirmed? Yes  Scheduled to see cardiology provider on Thursday 02/11/22 @ 10:55 am Are transportation arrangements needed? No  If their condition worsens, is the pt aware to call PCP or go to the Emergency Dept.? Yes Was the patient provided with contact information for the PCP's office or ED? No- declined; patient reports already has contact information for all care providers Was to pt encouraged to call back with questions or concerns? Yes; provided my direct phone number should questions/ concerns/ needs arise post- TOC call today  SDOH assessments and interventions completed:   Yes SDOH Interventions Today    Flowsheet Row Most Recent Value  SDOH Interventions   Food Insecurity Interventions Intervention Not Indicated  Transportation Interventions Intervention Not Indicated  [patient normally drives self,  wife providing transportation post-recent hospitalization]      Care Coordination Interventions:  Full medication review completed; provided education around low salt diet and benefit of cardiac rehabilitation    Encounter Outcome:  Pt. Visit Completed    Oneta Rack, RN, BSN, CCRN Alumnus RN CM Care Coordination/ Transition of [facility] [phone]: direct office

## 2022-02-05 ENCOUNTER — Encounter: Payer: Self-pay | Admitting: *Deleted

## 2022-02-05 ENCOUNTER — Telehealth: Payer: Self-pay | Admitting: *Deleted

## 2022-02-05 NOTE — Patient Outreach (Addendum)
  Care Coordination  TOC Follow Up Visit Note   02/05/2022 Name: KHYRIN TREVATHAN MRN: [id number] DOB: 12-Jun-1935  CHRISTOPH COPELAN is a 87 y.o. year old male who sees Plotnikov, Evie Lacks, MD for primary care. I spoke with  Garner Gavel by phone today.  Patent called me this morning, left voice message requesting call back- returned patient's call promptly  What matters to the patients health and wellness today?  "I just wanted to call you back after our call yesterday; I just want to make sure I understand the medication changes we talked about, to make sure I am taking them correctly."   Re-reviewed all medications with patient, including changes made after recent hospital discharge; patient verbalized good understanding of medications and re-assurance was provided based on his report; confirmed patient understands all routine medications and that antibiotic is to be taken twice daily for 6 days only; encouraged patient to call me back if he has ongoing questions  SDOH assessments and interventions completed:  No Completed yesterday during TOC call  Interventions Today    Flowsheet Row Most Recent Value  Pharmacy Interventions   Pharmacy Dicussed/Reviewed Pharmacy Topics Reviewed, Medications and their functions        Care Coordination Interventions:  Yes, provided   Follow up plan: No further intervention required.   Encounter Outcome:  Pt. Visit Completed   Oneta Rack, RN, BSN, CCRN Alumnus RN CM Care Coordination/ Transition of [place] [facility] [phone]: direct office

## 2022-02-08 ENCOUNTER — Telehealth (HOSPITAL_COMMUNITY): Payer: Self-pay

## 2022-02-08 NOTE — Telephone Encounter (Signed)
Per phase I cardiac rehab, fax referral to [facility] cardiac rehab.

## 2022-02-09 DIAGNOSIS — Z5111 Encounter for antineoplastic chemotherapy: Secondary | ICD-10-CM | POA: Diagnosis not present

## 2022-02-09 DIAGNOSIS — C672 Malignant neoplasm of lateral wall of bladder: Secondary | ICD-10-CM | POA: Diagnosis not present

## 2022-02-10 NOTE — Progress Notes (Deleted)
Office Visit    Patient Name: Jeff Burgess Date of Encounter: 02/10/2022  PCP:  Cassandria Anger, MD   [place]  Cardiologist:  None  Advanced Practice Provider:  No care team member to display Electrophysiologist:  None    HPI    Jeff Burgess is a 87 y.o. male with a past medical history of CAD/CABG in 1998, NIDDM-2, HTN, chronic back pain, bladder cancer status post TURBT on 01/15/2022 presents today for follow-up appointment for chest pain.  He was seen in the ER for an episode of chest pain after eating on Saturday and Monday of the previous week each lasting about 4 to 5 minutes.  He described the pain as tightness over his left chest.  Thought it could be indigestion.  Pain was worse with exertion.  No radiation to arms or jaw.  No associated dyspnea, diaphoresis, lightheadedness, nausea, or vomiting.  Last pain was 4 PM the day prior.  Had dysuria but that improved since bladder surgery.  He noted some hematuria while in the ED after starting IV heparin.  He does take low-dose aspirin.  Not on any other blood thinners.  He stated he was a DNR/DNI.  In the ED he was hypertensive 191/84.  Other vital signs stable.  Troponin 221>> 276.  EKG sinus rhythm with PACs and RBBB.  CXR without acute finding.  UA with hematuria.  Patient was started on IV heparin and cardiology consulted.  Suspicion was that his elevated troponin was related to demand ischemia.  Also suspected to have a UTI.  Today, he***  Past Medical History    Past Medical History:  Diagnosis Date   Benign localized prostatic hyperplasia with lower urinary tract symptoms (LUTS)    Bladder cancer [facility]) dx 05/ 2017--  urologist- dr Pilar Jarvis   TCC , Ta of bladder  s/p TURBT 06-02-2015   CAD (coronary artery disease)    no cardiologist--  followed by pcp,  dr plontnikov   Chronic low back pain with sciatica    CKD (chronic kidney disease), stage III ([place])    Diabetes mellitus, type 2  ([place])    ED (erectile dysfunction)    Full dentures    History of ETT    08-09-2014   fair exercise tolerance, no chest pain, normal BP response, no ST changes (baseline RBBB);  negative adequate ETT   Hyperlipidemia    Hypertension    Hypogonadism male    OA (osteoarthritis)    Right bundle branch block (RBBB)    S/P CABG x 1    1998   Sigmoid diverticulosis    Ventral hernia    midline   Wears glasses    Past Surgical History:  Procedure Laterality Date   COLONOSCOPY WITH PROPOFOL  02-01-2012   CORONARY ARTERY BYPASS GRAFT  1998  at [facility]   x1  vessel    CYSTOSCOPY WITH BIOPSY N/A 10/08/2015   Procedure: CYSTOSCOPY WITH BLADDER BIOPSY;  Surgeon: Nickie Retort, MD;  Location: [facility];  Service: Urology;  Laterality: N/A;   CYSTOSCOPY WITH FULGERATION N/A 10/08/2015   Procedure: CYSTOSCOPY WITH FULGERATION;  Surgeon: Nickie Retort, MD;  Location: [facility];  Service: Urology;  Laterality: N/A;   CYSTOSCOPY WITH FULGERATION N/A 01/14/2022   Procedure: CYSTOSCOPY WITH FULGERATION;  Surgeon: Lucas Mallow, MD;  Location: [facility];  Service: Urology;  Laterality: N/A;   TONSILLECTOMY  age 19  TRANSURETHRAL RESECTION OF BLADDER TUMOR WITH GYRUS (TURBT-GYRUS) N/A 06/02/2015   Procedure: TRANSURETHRAL RESECTION OF BLADDER TUMOR WITH GYRUS (TURBT-GYRUS);  Surgeon: Nickie Retort, MD;  Location: [facility];  Service: Urology;  Laterality: N/A;    Allergies  Allergies  Allergen Reactions   Coreg [Carvedilol] Other (See Comments)    weakness   Lovastatin     Leg weakness   Morphine Sulfate Other (See Comments)    Per the pt: "After taking morphine, within 30 mins, I was soaking wet. I did not like the way it made me feel."     EKGs/Labs/Other Studies Reviewed:   The following studies were reviewed today:  Echo 02/02/22  IMPRESSIONS     1. Left ventricular ejection fraction, by estimation, is 55%.  The left  ventricle has low normal function. There is mild left ventricular  hypertrophy. Left ventricular diastolic parameters are indeterminate.   2. Right ventricular systolic function is normal. The right ventricular  size is normal. There is normal pulmonary artery systolic pressure.   3. The mitral valve is normal in structure. Mild mitral valve  regurgitation.   4. AV is thickened, calcified with restricted motion. Peak and mean  gradients through the valve are 45 and 23 mm Hg respectively AVA (VTI) is  1.2 cm2 DVI is 0.35 consistent with moderate AS. [name] The aortic valve is  tricuspid. Aortic valve regurgitation is   mild.   5. The inferior vena cava is normal in size with greater than 50%  respiratory variability, suggesting right atrial pressure of 3 mmHg.   FINDINGS   Left Ventricle: Left ventricular ejection fraction, by estimation, is  55%. The left ventricle has low normal function. The left ventricular  internal cavity size was normal in size. There is mild left ventricular  hypertrophy. Left ventricular diastolic  parameters are indeterminate.   Right Ventricle: The right ventricular size is normal. Right vetricular  wall thickness was not assessed. Right ventricular systolic function is  normal. There is normal pulmonary artery systolic pressure. The tricuspid  regurgitant velocity is 1.34 m/s,  and with an assumed right atrial pressure of 8 mmHg, the estimated right  ventricular systolic pressure is [id number] mmHg.   Left Atrium: Left atrial size was normal in size.   Right Atrium: Right atrial size was normal in size.   Pericardium: Trivial pericardial effusion is present.   Mitral Valve: The mitral valve is normal in structure. Mild mitral valve  regurgitation.   Tricuspid Valve: The tricuspid valve is normal in structure. Tricuspid  valve regurgitation is trivial.   Aortic Valve: AV is thickened, calcified with restricted motion. Peak and  mean gradients  through the valve are 45 and 23 mm Hg respectively AVA  (VTI) is 1.2 cm2 DVI is 0.35 consistent with moderate AS. The aortic valve  is tricuspid. Aortic valve  regurgitation is mild. Aortic regurgitation PHT measures 442 msec. Aortic  valve mean gradient measures 25.0 mmHg. Aortic valve peak gradient  measures 41.3 mmHg. Aortic valve area, by VTI measures 1.21 cm.   Pulmonic Valve: The pulmonic valve was grossly normal. Pulmonic valve  regurgitation is trivial.   Aorta: The aortic root and ascending aorta are structurally normal, with  no evidence of dilitation.   Venous: The inferior vena cava is normal in size with greater than 50%  respiratory variability, suggesting right atrial pressure of 3 mmHg.   IAS/Shunts: No atrial level shunt detected by color flow Doppler.    EKG:  EKG is *** ordered today.  The ekg ordered today demonstrates ***  Recent Labs: 07/23/2021: TSH 1.51 01/14/2022: ALT 17 02/02/2022: B Natriuretic Peptide 363.6 02/03/2022: BUN 14; Creatinine, Ser 1.28; Hemoglobin 10.6; Magnesium 2.1; Platelets 196; Potassium 3.3; Sodium 136  Recent Lipid Panel    Component Value Date/Time   CHOL 83 02/03/2022 0616   TRIG 89 02/03/2022 0616   HDL 26 (L) 02/03/2022 0616   CHOLHDL 3.2 02/03/2022 0616   VLDL 18 02/03/2022 0616   LDLCALC 39 02/03/2022 0616   LDLDIRECT 78.4 11/02/2011 0936    Risk Assessment/Calculations:  {Does this patient have ATRIAL FIBRILLATION?:[id number]}  Home Medications   No outpatient medications have been marked as taking for the 02/11/22 encounter (Appointment) with Elgie Collard, PA-C.     Review of Systems   ***   All other systems reviewed and are otherwise negative except as noted above.  Physical Exam    VS:  There were no vitals taken for this visit. , BMI There is no height or weight on file to calculate BMI.  Wt Readings from Last 3 Encounters:  02/01/22 200 lb 2.8 oz (90.8 kg)  01/14/22 200 lb 2.8 oz (90.8 kg)  01/14/22 184  lb 1.4 oz (83.5 kg)     GEN: Well nourished, well developed, in no acute distress. HEENT: normal. Neck: Supple, no JVD, carotid bruits, or masses. Cardiac: ***RRR, no murmurs, rubs, or gallops. No clubbing, cyanosis, edema.  ***Radials/PT 2+ and equal bilaterally.  Respiratory:  ***Respirations regular and unlabored, clear to auscultation bilaterally. GI: Soft, nontender, nondistended. MS: No deformity or atrophy. Skin: Warm and dry, no rash. Neuro:  Strength and sensation are intact. Psych: Normal affect.  Assessment & Plan    NSTEMI, CAD/CABG history -thought to be possible demand ischemia -No inpatient workup thought to be warranted  Hematuria -resolved after heparin was discontinued  Anemia  HTN  No BP recorded.  {Refresh Note OR Click here to enter BP  :1}***      Disposition: Follow up {follow up:15908} with None or APP.  Signed, Elgie Collard, PA-C 02/10/2022, 10:14 AM [place]

## 2022-02-11 ENCOUNTER — Ambulatory Visit: Payer: Medicare Other | Admitting: Physician Assistant

## 2022-02-11 DIAGNOSIS — I10 Essential (primary) hypertension: Secondary | ICD-10-CM

## 2022-02-11 DIAGNOSIS — R319 Hematuria, unspecified: Secondary | ICD-10-CM

## 2022-02-11 DIAGNOSIS — I214 Non-ST elevation (NSTEMI) myocardial infarction: Secondary | ICD-10-CM

## 2022-02-13 NOTE — Progress Notes (Unsigned)
Cardiology Office Note:    Date:  02/13/2022   ID:  Jeff Burgess, DOB [birth year], MRN [id number]  PCP:  Cassandria Anger, MD  Cardiologist:  Lauree Chandler, MD  Electrophysiologist:  None   Referring MD: Cassandria Anger, MD   Chief Complaint: hospital follow-up of chest pain  History of Present Illness:    Jeff Burgess is a 87 y.o. male with a history of CAD s/p remote CABG in 1998, chronic RBBB, PAD with total occlusion of the right posterior tibial and left anterior tibial arteries on lower extremity dopplers in 2019, mild carotid artery disease, hypertension, hyperlipidemia, type 2 diabetes mellitus, CKD stage IIIa, hypogonadism, and bladder cancer s/p TURBT on 01/15/2022 who is followed by Dr. Angelena Form and presents today for hospital follow-up of chest pain.  Patient has a history CAD with remote CABG in 1998 but had not been followed by Cardiology in over a decade. His PCP ordered a ETT in 2016 which was negative. Carotid dopplers in 03/2016 showed mild disease (1-39% stenosis) of bilateral ICAs. Lower extremity dopplers and ABIs were ordered by PCP in 09/2017 for further evaluation of bilateral leg pain and showed total occlusion of the right posterior tibial artery and left anterior tibial artery. ABI was 1.12 on the right and 0.95 on the left. He was referred to Vascular Surgery and seen by Dr. Oneida Alar in 11/2017 who felt like symptoms were not consistent with claudication and more related to pseudoclaudication from back issues.   Patient was initially seen by Dr. Angelena Form during recent hospitalization. He was admitted from 02/01/2022 to 02/03/2022 for chest pain. He was markedly hypertensive on arrival to the ED with BP of 191/84. He reported intermittent episodes of chest pain over the last couple days. One episode occurred with exertion and resolved with rest and one episode occurred after eating. EKG showed normal sinus rhythm with known RBBB. +High-sensitivity troponin  was elevated at 221 >> 226. He was continuing to have gross hematuria at that time and hemoglobin was 11.9 (down from 14.6 in 10/2021). Echo showed LVEF of 55% with mild LVH as well as moderate AS and mild MR. He was initially started on IV Heparin but this was stopped due to gross hematuria. Troponin elevation was felt to be more consistent with demand ischemia in setting of anemia and elevated BP.Decision was made to treat medically given ongoing hematuria and bleeding risk. He was started on Imdur. Plavix was not started given bleeding risk. Beta-blocker was also noted started given history of weakness on Coreg but could be considered as an outpatient depending on how he does with Imdur.  Patient presents today for follow-up. ***  CAD s/p CABG S/p remote CABG in 1998. ETT in 2016 was negative. Recently admitted with chest pain. EKG showed normal sinus rhythm with known RBBB. High-sensitivity troponin mildly elevated and flat in the 220s consistent with demand ischemia. Echo showed normal LV function. Plan was to treat medically given ongoing hematuria from bladder cancer s/p recent TURBT and he was started on Imdur. - *** - Continue antianginals: Amlodipine '5mg'$  daily and Imdur '30mg'$  daily.  - Continue aspirin and high-intensity statin.  Aortic Stenosis Mitral Regurgitation Echo during recent admission in 01/2022 showed moderate AS (mean gradient 23 mmHg, peak gradient 45 mmHg, AVA (VTI) 1.2cm^2, DVI 0.35) and mild MR. - Will continue to monitor routinely as an outpatient. Recommend repeat Echo in 01/2023.   PAD Lower extremity dopplers in 2019 showed total occlusion of the  right posterior tibial artery and left anterior tibial artery. ABI was 1.12 on the right and 0.95 on the left. He was referred to Vascular Surgery who felt symptoms were not consistent with claudication and more related to pseudoclaudication from back issues.  - *** - Continue aspirin and high-intensity  statin.  Hypertension BP *** - Continue current medications: Amlodipine '5mg'$  daily, Imdur '30mg'$  daily, Telmisartan-HCTZ 80-'25mg'$  daily.   Hyperlipidemia Lipid panel on 02/03/2022: Total Cholesterol 83, Triglycerides 89, HDL 26, LDL 39. LDL goal <70 given CAD. - Continue Lipitor '80mg'$  daily.   Type 2 Diabetes Mellitus Hemoglobin A1c 6.9% in 01/2022. - On Metformin.  - Management per PCP.  CKD Stage III Baseline creatinine around 1.2 to 1.4. Stable at 1.28 on last labs in 01/2022.  Bladder Cancer Hematuria ***   Past Medical History:  Diagnosis Date   Benign localized prostatic hyperplasia with lower urinary tract symptoms (LUTS)    Bladder cancer [facility]) dx 05/ 2017--  urologist- dr Pilar Jarvis   TCC , Ta of bladder  s/p TURBT 06-02-2015   CAD (coronary artery disease)    no cardiologist--  followed by pcp,  dr plontnikov   Chronic low back pain with sciatica    CKD (chronic kidney disease), stage III ([place])    Diabetes mellitus, type 2 ([place])    ED (erectile dysfunction)    Full dentures    History of ETT    08-09-2014   fair exercise tolerance, no chest pain, normal BP response, no ST changes (baseline RBBB);  negative adequate ETT   Hyperlipidemia    Hypertension    Hypogonadism male    OA (osteoarthritis)    Right bundle branch block (RBBB)    S/P CABG x 1    1998   Sigmoid diverticulosis    Ventral hernia    midline   Wears glasses     Past Surgical History:  Procedure Laterality Date   COLONOSCOPY WITH PROPOFOL  02-01-2012   CORONARY ARTERY BYPASS GRAFT  1998  at [facility]   x1  vessel    CYSTOSCOPY WITH BIOPSY N/A 10/08/2015   Procedure: CYSTOSCOPY WITH BLADDER BIOPSY;  Surgeon: Nickie Retort, MD;  Location[facility]  Service: Urology;  Laterality: N/A;   CYSTOSCOPY WITH FULGERATION N/A 10/08/2015   Procedure: CYSTOSCOPY WITH FULGERATION;  Surgeon: Nickie Retort, MD;  Location: [facility];  Service: Urology;   Laterality: N/A;   CYSTOSCOPY WITH FULGERATION N/A 01/14/2022   Procedure: CYSTOSCOPY WITH FULGERATION;  Surgeon: Lucas Mallow, MD;  Location: [facility];  Service: Urology;  Laterality: N/A;   TONSILLECTOMY  age 68   TRANSURETHRAL RESECTION OF BLADDER TUMOR WITH GYRUS (TURBT-GYRUS) N/A 06/02/2015   Procedure: TRANSURETHRAL RESECTION OF BLADDER TUMOR WITH GYRUS (TURBT-GYRUS);  Surgeon: Nickie Retort, MD;  Location: [facility];  Service: Urology;  Laterality: N/A;    Current Medications: No outpatient medications have been marked as taking for the 02/18/22 encounter (Appointment) with Darreld Mclean, PA-C.     Allergies:   Coreg [carvedilol], Lovastatin, and Morphine sulfate   Social History   Socioeconomic History   Marital status: Married    Spouse name: Not on file   Number of children: Not on file   Years of education: Not on file   Highest education level: Not on file  Occupational History   Occupation: retired    Comment: but working fulltime  Tobacco Use   Smoking status: Former  Years: 15.00    Types: Cigarettes    Quit date: 01/18/1971    Years since quitting: 51.1   Smokeless tobacco: Current    Types: Snuff  Vaping Use   Vaping Use: Never used  Substance and Sexual Activity   Alcohol use: No   Drug use: No   Sexual activity: Not Currently  Other Topics Concern   Not on file  Social History Narrative   Regular exercise-yes   Social Determinants of Health   Financial Resource Strain: Low Risk  (05/18/2021)   Overall Financial Resource Strain (CARDIA)    Difficulty of Paying Living Expenses: Not hard at all  Food Insecurity: No Food Insecurity (02/04/2022)   Hunger Vital Sign    Worried About Running Out of Food in the Last Year: Never true    Ran Out of Food in the Last Year: Never true  Transportation Needs: No Transportation Needs (02/04/2022)   PRAPARE - Hydrologist (Medical): No    Lack of Transportation  (Non-Medical): No  Physical Activity: Sufficiently Active (05/18/2021)   Exercise Vital Sign    Days of Exercise per Week: 5 days    Minutes of Exercise per Session: 30 min  Stress: No Stress Concern Present (05/18/2021)   [place]    Feeling of Stress : Not at all  Social Connections: [place] (05/18/2021)   Social Connection and Isolation Panel [NHANES]    Frequency of Communication with Friends and Family: More than three times a week    Frequency of Social Gatherings with Friends and Family: More than three times a week    Attends Religious Services: More than 4 times per year    Active Member of [employer] or Organizations: Yes    Attends Music therapist: More than 4 times per year    Marital Status: Married     Family History: The patient's family history includes Alcohol abuse (age of onset: 19) in his father; Arthritis (age of onset: 37) in his mother; Diabetes in his brother; Heart disease in his brother; Hypertension in an other family member; Kidney disease in his brother. There is no history of Colon cancer, Esophageal cancer, Rectal cancer, or Stomach cancer.  ROS:   Please see the history of present illness.     EKGs/Labs/Other Studies Reviewed:    The following studies were reviewed:  Carotid Ultrasound 03/24/2016: -1-39% stenosis of bilateral ICAs. - Normal vertebral arteries with antegrade flow. - Normal subclavian arteries bilaterally. _______________  Lower Extremity Arterial Ultrasound/ ABI 09/28/2017: Final Interpretation: Right: Total occlusion noted in the posterior tibial artery.  Left: Total occlusion noted in the anterior tibial artery.   ABI Summary: Right: Resting right ankle-brachial index is within normal range (1.12). No  evidence of significant right lower extremity arterial disease. The right  toe-brachial index is abnormal. RT great toe pressure = 64 mmHg.    Left: Resting left ankle-brachial index is within normal range (0.95). No  evidence of significant left lower extremity arterial disease. The left  toe-brachial index is abnormal. LT Great toe pressure = 46 mmHg.   _______________  Echocardiogram 02/02/2022: Impressions:  1. Left ventricular ejection fraction, by estimation, is 55%. The left  ventricle has low normal function. There is mild left ventricular  hypertrophy. Left ventricular diastolic parameters are indeterminate.   2. Right ventricular systolic function is normal. The right ventricular  size is normal. There is normal pulmonary artery  systolic pressure.   3. The mitral valve is normal in structure. Mild mitral valve  regurgitation.   4. AV is thickened, calcified with restricted motion. Peak and mean  gradients through the valve are 45 and 23 mm Hg respectively AVA (VTI) is  1.2 cm2 DVI is 0.35 consistent with moderate AS. [name] The aortic valve is  tricuspid. Aortic valve regurgitation is   mild.   5. The inferior vena cava is normal in size with greater than 50%  respiratory variability, suggesting right atrial pressure of 3 mmHg.    EKG:  EKG not ordered today.  Recent Labs: 07/23/2021: TSH 1.51 01/14/2022: ALT 17 02/02/2022: B Natriuretic Peptide 363.6 02/03/2022: BUN 14; Creatinine, Ser 1.28; Hemoglobin 10.6; Magnesium 2.1; Platelets 196; Potassium 3.3; Sodium 136  Recent Lipid Panel    Component Value Date/Time   CHOL 83 02/03/2022 0616   TRIG 89 02/03/2022 0616   HDL 26 (L) 02/03/2022 0616   CHOLHDL 3.2 02/03/2022 0616   VLDL 18 02/03/2022 0616   LDLCALC 39 02/03/2022 0616   LDLDIRECT 78.4 11/02/2011 0936    Physical Exam:    Vital Signs: There were no vitals taken for this visit.    Wt Readings from Last 3 Encounters:  02/01/22 200 lb 2.8 oz (90.8 kg)  01/14/22 200 lb 2.8 oz (90.8 kg)  01/14/22 184 lb 1.4 oz (83.5 kg)     General: 87 y.o. male in no acute distress. HEENT: Normocephalic and  atraumatic. Sclera clear. EOMs intact. Neck: Supple. No carotid bruits. No JVD. Heart: *** RRR. Distinct S1 and S2. No murmurs, gallops, or rubs. Radial and distal pedal pulses 2+ and equal bilaterally. Lungs: No increased work of breathing. Clear to ausculation bilaterally. No wheezes, rhonchi, or rales.  Abdomen: Soft, non-distended, and non-tender to palpation. Bowel sounds present in all 4 quadrants.  MSK: Normal strength and tone for age. *** Extremities: No lower extremity edema.    Skin: Warm and dry. Neuro: Alert and oriented x3. No focal deficits. Psych: Normal affect. Responds appropriately.   Assessment:    No diagnosis found.  Plan:     Disposition: Follow up in ***   Medication Adjustments/Labs and Tests Ordered: Current medicines are reviewed at length with the patient today.  Concerns regarding medicines are outlined above.  No orders of the defined types were placed in this encounter.  No orders of the defined types were placed in this encounter.   There are no Patient Instructions on file for this visit.   Signed, Darreld Mclean, PA-C  02/13/2022 10:17 AM    [facility]

## 2022-02-16 DIAGNOSIS — Z5111 Encounter for antineoplastic chemotherapy: Secondary | ICD-10-CM | POA: Diagnosis not present

## 2022-02-16 DIAGNOSIS — C672 Malignant neoplasm of lateral wall of bladder: Secondary | ICD-10-CM | POA: Diagnosis not present

## 2022-02-16 NOTE — Progress Notes (Unsigned)
Cardiology Clinic Note   Patient Name: Jeff Burgess Date of Encounter: 02/18/2022  Primary Care Provider:  Cassandria Anger, MD Primary Cardiologist:  Lauree Chandler, MD  Patient Profile    Jeff Burgess is a 87 y.o. male with a past medical history of CAD s/p CABG x 1 1998, RBBB, carotid artery disease, PAD, hypertension, hyperlipidemia, CKD stage IIIa, T2DM, bladder CA who presents to the clinic today for hospital follow-up.  Past Medical History    Past Medical History:  Diagnosis Date   Benign localized prostatic hyperplasia with lower urinary tract symptoms (LUTS)    Bladder cancer [facility]) dx 05/ 2017--  urologist- dr Pilar Burgess   TCC , Ta of bladder  s/p TURBT 06-02-2015   CAD (coronary artery disease)    no cardiologist--  followed by pcp,  dr Burgess   Chronic low back pain with sciatica    CKD (chronic kidney disease), stage III ([place])    Diabetes mellitus, type 2 ([place])    ED (erectile dysfunction)    Full dentures    History of ETT    08-09-2014   fair exercise tolerance, no chest pain, normal BP response, no ST changes (baseline RBBB);  negative adequate ETT   Hyperlipidemia    Hypertension    Hypogonadism male    OA (osteoarthritis)    Right bundle branch block (RBBB)    S/P CABG x 1    1998   Sigmoid diverticulosis    Ventral hernia    midline   Wears glasses    Past Surgical History:  Procedure Laterality Date   COLONOSCOPY WITH PROPOFOL  02-01-2012   CORONARY ARTERY BYPASS GRAFT  1998  at [facility]   x1  vessel    CYSTOSCOPY WITH BIOPSY N/A 10/08/2015   Procedure: CYSTOSCOPY WITH BLADDER BIOPSY;  Surgeon: Nickie Retort, MD;  Location: [facility];  Service: Urology;  Laterality: N/A;   CYSTOSCOPY WITH FULGERATION N/A 10/08/2015   Procedure: CYSTOSCOPY WITH FULGERATION;  Surgeon: Nickie Retort, MD;  Location: [facility];  Service: Urology;  Laterality: N/A;   CYSTOSCOPY WITH FULGERATION N/A  01/14/2022   Procedure: CYSTOSCOPY WITH FULGERATION;  Surgeon: Lucas Mallow, MD;  Location: [facility];  Service: Urology;  Laterality: N/A;   TONSILLECTOMY  age 57   TRANSURETHRAL RESECTION OF BLADDER TUMOR WITH GYRUS (TURBT-GYRUS) N/A 06/02/2015   Procedure: TRANSURETHRAL RESECTION OF BLADDER TUMOR WITH GYRUS (TURBT-GYRUS);  Surgeon: Nickie Retort, MD;  Location: [facility];  Service: Urology;  Laterality: N/A;    Allergies  Allergies  Allergen Reactions   Atorvastatin     Weak legs   Coreg [Carvedilol] Other (See Comments)    weakness   Lovastatin     Leg weakness   Morphine Sulfate Other (See Comments)    Per the pt: "After taking morphine, within 30 mins, I was soaking wet. I did not like the way it made me feel."    History of Present Illness    Jeff Burgess has a past medical history of: CAD.   S/p CABG x 1 1998. Moderate AS/mild AI/mild MR. Echo 02/02/2022: EF 55%.  Mild LVH.  Mild MR.  Aortic valve is thickened, calcified with restricted motion.  Moderate AS. Aortic valve is tricuspid.  Mild AR. RBBB.   Carotid artery disease. Carotid ultrasound 03/24/2016: Bilateral ICA 1 to 39%. PAD. Lower extremity arterial ultrasound 09/28/2017: Total occlusion right posterior tibial artery.  Total occlusion left anterior tibial artery. ABIs 09/28/2017: Normal. Hypertension. Hyperlipidemia. Lipid panel 02/03/2022: LDL 39, HDL 26, TG 89, total 83. T2DM. CKD stage IIIa. Bladder CA. TURBT 01/13/2022.   Jeff Burgess was first evaluated by Jeff Burgess on 02/02/2022 for chest pain.  Patient presented to the [facility] ED on 02/01/2022 with complaints of 4-day history of intermittent chest pain.  Troponin 221>> 276.  EKG showed sinus rhythm with PACs and RBBB.  Patient was given 324 mg ASA and started on heparin and IV fluids.  He was transported to [facility].  Patient was noted to have hematuria while in the ED.  Hematuria resolved after IV heparin was  discontinued.  There was suspicion of demand ischemia vs NSTEMI.  The decision was made to treat patient medically.  He was not started on Plavix due to bleeding risk.  Beta-blocker was not started secondary to history of weakness with carvedilol.  He was started on Imdur.  Will continue to follow as outpatient for the above outlined history.  Today, patient is accompanied by his wife.  He has been doing well since he was discharged from the hospital.  He denies any further chest pain.  No other episodes of hematuria since discharge.  He has not gone back to his usual activity, as he wanted to wait for this appointment to make sure it was safe.  His biggest complaint is fatigue.  He feels this is in part due to inactivity since his first bladder surgery on 01/13/2022.  Patient explains bladder surgery was complicated by pain and bleeding for which he was seen in the emergency department and had to undergo cystoscopy with clot evacuation and fulguration.  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  He reports occasional lower extremity edema which is improved from previous.  He does elevate his legs in his recliner but has not been wearing compression stockings.  He complains of claudication and balance issues that are worse than when he was evaluated by Jeff Burgess, vascular surgery, in 2019.  Patient denies shortness of breath, DOE, orthopnea, or PND.  He was initially hypertensive upon arrival with BP 154/70.  He reported he was nervous, as this was his first appointment here.  BP recheck 130/70.  Patient reports taking 81 mg aspirin twice a day.  He is uncertain why he is doing that or who told him to do that.  Patient is instructed to take aspirin once a day.  Patient was seen by his PCP, Jeff Burgess 1 day ago. He complained of leg pain so his Lipitor was stopped and he was started on Livalo. He will have his lipid  panel checked in a couple of months.    Home Medications    Current Meds  Medication Sig   amLODipine (NORVASC) 5 MG tablet TAKE 1 TABLET BY MOUTH  DAILY (Patient taking differently: Take 5 mg by mouth daily.)   aspirin 81 MG tablet Take 2 tablets (162 mg total) by mouth daily. (Patient taking differently: Take 81 mg by mouth in the morning and at bedtime.)   cholecalciferol (VITAMIN D) 25 MCG (1000 UT) tablet Take 1 tablet (1,000 Units total) by mouth every morning. Follow-up due in April must see provider for refills (Patient taking differently: Take 1,000 Units by mouth every morning.)   Cinnamon 500 MG capsule Take 200-500 mg by mouth 3 (three) times a week.   HYDROcodone-acetaminophen (NORCO/VICODIN)  5-325 MG tablet Take 1 tablet by mouth every 6 (six) hours as needed for moderate pain or severe pain.   isosorbide mononitrate (IMDUR) 30 MG 24 hr tablet Take 1 tablet (30 mg total) by mouth daily.   metFORMIN (GLUCOPHAGE-XR) 500 MG 24 hr tablet TAKE 1 TABLET BY MOUTH  TWICE DAILY (Patient taking differently: Take 500 mg by mouth 2 (two) times daily with a meal.)   Pitavastatin Calcium (LIVALO) 4 MG TABS Take 1 tablet (4 mg total) by mouth daily.   telmisartan-hydrochlorothiazide (MICARDIS HCT) 80-25 MG tablet Take 1 tablet by mouth daily.   TYLENOL 500 MG tablet Take 500-1,000 mg by mouth every 8 (eight) hours as needed (for pain).    Family History    Family History  Problem Relation Age of Onset   Arthritis Mother 71       RA   Alcohol abuse Father 21       exposure   Diabetes Brother    Kidney disease Brother    Heart disease Brother    Hypertension Other    Colon cancer Neg Hx    Esophageal cancer Neg Hx    Rectal cancer Neg Hx    Stomach cancer Neg Hx    He indicated that his mother is deceased. He indicated that his father is deceased. He indicated that his sister is alive. He indicated that his brother is deceased. He indicated that the status of his neg hx is unknown.  He indicated that the status of his other is unknown.   Social History    Social History   Socioeconomic History   Marital status: Married    Spouse name: Not on file   Number of children: Not on file   Years of education: Not on file   Highest education level: Not on file  Occupational History   Occupation: retired    Comment: but working fulltime  Tobacco Use   Smoking status: Former    Years: 15.00    Types: Cigarettes    Quit date: 01/18/1971    Years since quitting: 51.1   Smokeless tobacco: Current    Types: Snuff  Vaping Use   Vaping Use: Never used  Substance and Sexual Activity   Alcohol use: No   Drug use: No   Sexual activity: Not Currently  Other Topics Concern   Not on file  Social History Narrative   Regular exercise-yes   Social Determinants of Health   Financial Resource Strain: Low Risk  (05/18/2021)   Overall Financial Resource Strain (CARDIA)    Difficulty of Paying Living Expenses: Not hard at all  Food Insecurity: No Food Insecurity (02/04/2022)   Hunger Vital Sign    Worried About Running Out of Food in the Last Year: Never true    [place] in the Last Year: Never true  Transportation Needs: No Transportation Needs (02/04/2022)   PRAPARE - Hydrologist (Medical): No    Lack of Transportation (Non-Medical): No  Physical Activity: Sufficiently Active (05/18/2021)   Exercise Vital Sign    Days of Exercise per Week: 5 days    Minutes of Exercise per Session: 30 min  Stress: No Stress Concern Present (05/18/2021)   [place]    Feeling of Stress : Not at all  Social Connections: [place] (05/18/2021)   Social Connection and Isolation Panel [NHANES]    Frequency of Communication with Friends and  Family: More than three times a week    Frequency of Social Gatherings with Friends and Family: More than three times a week    Attends Religious  Services: More than 4 times per year    Active Member of [employer] or Organizations: Yes    Attends Music therapist: More than 4 times per year    Marital Status: Married  Human resources officer Violence: Not At Risk (02/02/2022)   Humiliation, Afraid, Rape, and Kick questionnaire    Fear of Current or Ex-Partner: No    Emotionally Abused: No    Physically Abused: No    Sexually Abused: No     Review of Systems    General: No chills, fever, night sweats or weight changes. Positive for fatigue.   Cardiovascular:  No chest pain, dyspnea on exertion, orthopnea, palpitations, paroxysmal nocturnal dyspnea. Improving edema.  Dermatological: No rash, lesions/masses Respiratory: No cough, dyspnea Urologic: No hematuria, dysuria Abdominal:   No nausea, vomiting, diarrhea, bright red blood per rectum, melena, or hematemesis Neurologic:  No visual changes, weakness, changes in mental status. All other systems reviewed and are otherwise negative except as noted above.  Physical Exam    VS:  BP (!) 154/70   Pulse 80   Ht '5\' 9"'$  (1.753 m)   Wt 190 lb 9.6 oz (86.5 kg)   SpO2 96%   BMI 28.15 kg/m  , BMI Body mass index is 28.15 kg/m. GEN: Well nourished, well developed, in no acute distress. HEENT: Normal. Neck: Supple, no JVD, carotid bruits, or masses. Cardiac: RRR, no rubs or gallops. 2/6 systolic murmur. No clubbing, cyanosis.  Radials 2+ and equal bilaterally. DP/PT 1+ and equal bilaterally. 1+ edema bilaterally.  Respiratory:  Respirations regular and unlabored, clear to auscultation bilaterally. GI: Soft, nontender, nondistended. MS: No deformity or atrophy. Skin: Warm and dry, no rash. Neuro: Strength and sensation are intact. Psych: Normal affect.  Accessory Clinical Findings   Recent Labs: 07/23/2021: TSH 1.51 01/14/2022: ALT 17 02/02/2022: B Natriuretic Peptide 363.6 02/03/2022: BUN 14; Creatinine, Ser 1.28; Hemoglobin 10.6; Magnesium 2.1; Platelets 196; Potassium 3.3;  Sodium 136   Recent Lipid Panel    Component Value Date/Time   CHOL 83 02/03/2022 0616   TRIG 89 02/03/2022 0616   HDL 26 (L) 02/03/2022 0616   CHOLHDL 3.2 02/03/2022 0616   VLDL 18 02/03/2022 0616   LDLCALC 39 02/03/2022 0616   LDLDIRECT 78.4 11/02/2011 0936      ECG is not indicated today.    Assessment & Plan   CAD.  S/p CABG x 1 1998.  Echo January 2024 showed EF 55%, mild LVH.  Patient denies chest pain, tightness, pressure.  He denies shortness of breath or DOE.  He reports continued fatigue.  He did not want to start getting back to his regular activities until he had this appointment.  He is encouraged to progress activities as tolerated.  Given continued complaints of fatigue and history of weakness on carvedilol will hold off on beta-blocker for now and revisit adding it at next visit.  Continue aspirin, isosorbide and Livalo. Moderate AS/mild AI/mild MR. Echo January 2024 showed mild MR and AR as well as moderate AS.  Patient denies shortness of breath or DOE.  He weighs every couple of days and reports it is stable.  Improving lower extremity edema, 1+ bilaterally.  2/6 systolic murmur on exam. Euvolemic and well compensated on exam. Patient is encouraged to elevate legs when possible, wear compression socks, and weigh  daily. Patient will need repeat echo around January 2025.  Claudication/poor balance/PAD.  This is complicated by neuropathy.  Lower extremity arterial ultrasound September 2019 showed total occlusion right posterior tibial artery and total occlusion left anterior tibial artery.  ABIs were normal.  Patient was evaluated by vascular surgery and told he had adequate blood flow and did not need intervention.  Patient complains of increased claudication and balance issues since that time. 1+ PT/DP and equal bilaterally. Will refer back to vascular surgery. Continue aspirin and Livalo.  Carotid artery disease.  Carotid ultrasound March 2018 showed bilateral ICA 1 to 39%.   Patient denies dizziness, lightheadedness, presyncope, or syncope.  Continue aspirin and Livalo. Hypertension.  BP today 154/70 at intake and 130/70 on recheck. Patient denies headaches or dizziness. He is instructed to keep BP log for two weeks. If BP >130/80 consistently he will call the office. Will consider addition of spironolactone. Continue amlodipine and Micardis. Hyperlipidemia.  LDL January 2024 39, at goal. PCP stopped atorvastatin secondary to complaints of bilateral leg pain. Patient with history of PAD/claudication. If leg pain is not improved at follow-up will consider restarting atorvastatin vs. referral to pharm D lipid clinic. PCP to recheck lipids in a couple of months.      Disposition: BP log for two weeks. If BP consistently >130/80 patient will call office. Refer back to vascular surgery (was seen in 2019). Return in 3 months or sooner as needed.    Justice Britain. Jhada Risk, DNP, NP-C     02/18/2022, 8:32 AM [place] [address] Office [phone] Fax [phone]

## 2022-02-17 ENCOUNTER — Ambulatory Visit (INDEPENDENT_AMBULATORY_CARE_PROVIDER_SITE_OTHER): Payer: Medicare Other | Admitting: Internal Medicine

## 2022-02-17 ENCOUNTER — Encounter: Payer: Self-pay | Admitting: Internal Medicine

## 2022-02-17 VITALS — BP 122/82 | HR 78 | Temp 98.2°F | Ht 69.0 in | Wt 189.0 lb

## 2022-02-17 DIAGNOSIS — R5383 Other fatigue: Secondary | ICD-10-CM | POA: Diagnosis not present

## 2022-02-17 DIAGNOSIS — C678 Malignant neoplasm of overlapping sites of bladder: Secondary | ICD-10-CM

## 2022-02-17 DIAGNOSIS — M48061 Spinal stenosis, lumbar region without neurogenic claudication: Secondary | ICD-10-CM

## 2022-02-17 DIAGNOSIS — C679 Malignant neoplasm of bladder, unspecified: Secondary | ICD-10-CM | POA: Diagnosis not present

## 2022-02-17 DIAGNOSIS — I214 Non-ST elevation (NSTEMI) myocardial infarction: Secondary | ICD-10-CM | POA: Diagnosis not present

## 2022-02-17 DIAGNOSIS — N1831 Chronic kidney disease, stage 3a: Secondary | ICD-10-CM

## 2022-02-17 DIAGNOSIS — R269 Unspecified abnormalities of gait and mobility: Secondary | ICD-10-CM

## 2022-02-17 DIAGNOSIS — E1122 Type 2 diabetes mellitus with diabetic chronic kidney disease: Secondary | ICD-10-CM

## 2022-02-17 DIAGNOSIS — E785 Hyperlipidemia, unspecified: Secondary | ICD-10-CM | POA: Diagnosis not present

## 2022-02-17 MED ORDER — ISOSORBIDE MONONITRATE ER 30 MG PO TB24: 30.0000 mg | ORAL_TABLET | Freq: Every day | ORAL | 3 refills | Status: DC

## 2022-02-17 MED ORDER — PITAVASTATIN CALCIUM 4 MG PO TABS: 4.0000 mg | ORAL_TABLET | Freq: Every day | ORAL | 3 refills | Status: DC

## 2022-02-17 MED ORDER — TELMISARTAN-HCTZ 80-25 MG PO TABS: 1.0000 | ORAL_TABLET | Freq: Every day | ORAL | 3 refills | Status: DC

## 2022-02-17 NOTE — Assessment & Plan Note (Signed)
Recent UTI - treated

## 2022-02-17 NOTE — Assessment & Plan Note (Signed)
D/c Lipitor due to LE weakness Start Livalo 1/2 tablet every other day

## 2022-02-17 NOTE — Progress Notes (Signed)
Subjective:  Patient ID: Jeff Burgess, male    DOB: July 05, 1935  Age: 87 y.o. MRN: [id number]  CC: Follow-up (Discuss new BP meds)   HPI KOBEY SIDES presents for DM, HTN, MI C/o weak legs - worse on Lipitor; can't walk  Per hx:  "Admit date: 02/01/2022 Discharge date: 02/03/2022   Time spent: 40 minutes   Recommendations for Outpatient Follow-up:  Follow outpatient CBC/CMP  Follow with urology outpatient for his hematuria Follow with cardiology outpatient for NSTEMI/chest pain Follow blood pressure outpatient  Follow hemoglobin outpatient, trend     Discharge Diagnoses:  Principal Problem:   NSTEMI (non-ST elevated myocardial infarction) ([place]) Active Problems:   Essential hypertension   LOW BACK PAIN   DM (diabetes mellitus), type 2 with renal complications ([place])   RBBB   Bladder cancer ([place])   Hematuria     Discharge Condition: stable   Diet recommendation: heart healthy, diabetic      Filed Weights    02/01/22 1839  Weight: 90.8 kg      History of present illness:  YAIR DUSZA is a 87 year old M with PMH of CAD/CABG in 1998, NIDDM-2, HTN, chronic back pain, bladder cancer s/p TURBT on 01/15/2022 presenting with intermittent chest pain for 2 days.   Patient reports brief episode of chest pain after eating on Saturday and Monday, each lasted about 4 to 5 minutes.  Describes the pain as tightness over his left chest.  He thought it could be indigestion.  Pain is worse with exertion.  No radiation to his arms or jaws.  No associated dyspnea, diaphoresis, lightheadedness, nausea or vomiting.  Denies fever, chills, cough, orthopnea, PND or edema.  Last pain was about 4 PM yesterday.  He has dysuria that has improved since he had bladder surgery.  He noted some hematuria while in ED after he was started on IV heparin.  He takes low-dose aspirin.  Not on any other blood thinners.   Patient denies smoking cigarette but reports using dip.  Denies drinking alcohol  or recreational drug use.  He states he is DNR/DNI.   In ED, hypertensive to 191/84.  Other vital stable.  Basic labs including BMP and CBC without significant finding other than hyperglycemia.  Troponin 221>> 276.  EKG sinus rhythm with PAC and RBBB.  CXR without acute finding.  UA with hematuria.  Patient was started on IV heparin.  Cardiology consulted.  Admission accepted by overnight admitted.    He was seen by cardiology for his elevated troponin and chest pain.  Suspicion was presentation related to demand ischemia.  Hematuria developed after heparin started, resolved with discontinuation, also suspected to have UTI.     See below for additional details   Hospital Course:  Assessment and Plan: Non-STEMI in patient with history of CAD/CABG:  Troponin peaked at 276 EKG features sinus rhythm with PACs and old RBBB.  Currently chest pain-free.  Patient was started on IV heparin which is discontinued due to gross hematuria. Echo with normal EF, moderate AS Continue aspirin.  Start imdur.  Holding off on beta blocker for now with hx weakness with coreg.  Started on atorvastatin (stop pravastatin). Appreciate cardiology assistance, suspected demand ischemia.  Planning for outpatient follow up.   Gross hematuria/bladder cancer s/p TURBT on 01/15/2021  Hemorrhagic Cystitis.  Patient and nursing staff reports gross hematuria after started IV heparin in ED.  I did not see urine sample in urinal this morning: H&H relatively stable now -IV  heparin discontinued -Urology consulted - recommending treating for UTI with augmentin.  Per discussion, they're ok with aspirin.   - follow outpatient    Anemia - relatively stable in setting of hematuria above - follow outpatient    Uncontrolled NIDDM-2 with hyperglycemia: A1c 7.0% in 10/2021.  On metformin at home. -Recheck hemoglobin A1c 6.9 -resume metformin at discharge   Uncontrolled hypertension: BP elevated to 190/84 on arrival.  Improved. -BP meds  improved, continue home BP meds at discharge     Goal of care counseling: Confirmed DNR/DNI with patient."          Outpatient Medications Prior to Visit  Medication Sig Dispense Refill   amLODipine (NORVASC) 5 MG tablet TAKE 1 TABLET BY MOUTH  DAILY (Patient taking differently: Take 5 mg by mouth daily.) 90 tablet 3   aspirin 81 MG tablet Take 2 tablets (162 mg total) by mouth daily. (Patient taking differently: Take 81 mg by mouth in the morning and at bedtime.) 100 tablet 3   cholecalciferol (VITAMIN D) 25 MCG (1000 UT) tablet Take 1 tablet (1,000 Units total) by mouth every morning. Follow-up due in April must see provider for refills (Patient taking differently: Take 1,000 Units by mouth every morning.) 90 tablet 0   Cinnamon 500 MG capsule Take 200-500 mg by mouth 3 (three) times a week.     HYDROcodone-acetaminophen (NORCO/VICODIN) 5-325 MG tablet Take 1 tablet by mouth every 6 (six) hours as needed for moderate pain or severe pain.     metFORMIN (GLUCOPHAGE-XR) 500 MG 24 hr tablet TAKE 1 TABLET BY MOUTH  TWICE DAILY (Patient taking differently: Take 500 mg by mouth 2 (two) times daily with a meal.) 200 tablet 2   TYLENOL 500 MG tablet Take 500-1,000 mg by mouth every 8 (eight) hours as needed (for pain).     atorvastatin (LIPITOR) 80 MG tablet Take 1 tablet (80 mg total) by mouth daily. 30 tablet 1   isosorbide mononitrate (IMDUR) 30 MG 24 hr tablet Take 1 tablet (30 mg total) by mouth daily. 30 tablet 1   telmisartan-hydrochlorothiazide (MICARDIS HCT) 80-25 MG tablet Take 1 tablet by mouth daily. 90 tablet 3   No facility-administered medications prior to visit.    ROS: Review of Systems  Constitutional:  Positive for fatigue. Negative for appetite change and unexpected weight change.  HENT:  Negative for congestion, nosebleeds, sneezing, sore throat and trouble swallowing.   Eyes:  Negative for itching and visual disturbance.  Respiratory:  Negative for cough.    Cardiovascular:  Negative for chest pain, palpitations and leg swelling.  Gastrointestinal:  Negative for abdominal distention, blood in stool, diarrhea and nausea.  Genitourinary:  Negative for frequency and hematuria.  Musculoskeletal:  Positive for arthralgias, gait problem and myalgias. Negative for back pain, joint swelling and neck pain.  Skin:  Negative for rash.  Neurological:  Positive for weakness. Negative for dizziness, tremors and speech difficulty.  Hematological:  Bruises/bleeds easily.  Psychiatric/Behavioral:  Negative for agitation, dysphoric mood, sleep disturbance and suicidal ideas. The patient is not nervous/anxious.     Objective:  BP 122/82 (BP Location: Right Arm, Patient Position: Sitting, Cuff Size: Normal)   Pulse 78   Temp 98.2 F (36.8 C) (Oral)   Ht '5\' 9"'$  (1.753 m)   Wt 189 lb (85.7 kg)   SpO2 97%   BMI 27.91 kg/m   BP Readings from Last 3 Encounters:  02/17/22 122/82  02/03/22 115/61  01/15/22 (!) 145/72  Wt Readings from Last 3 Encounters:  02/17/22 189 lb (85.7 kg)  02/01/22 200 lb 2.8 oz (90.8 kg)  01/14/22 200 lb 2.8 oz (90.8 kg)    Physical Exam Constitutional:      General: He is not in acute distress.    Appearance: He is well-developed. He is obese. He is not diaphoretic.     Comments: NAD  HENT:     Head: Normocephalic and atraumatic.     Right Ear: External ear normal.     Left Ear: External ear normal.     Nose: Nose normal.     Mouth/Throat:     Pharynx: No oropharyngeal exudate.  Eyes:     General: No scleral icterus.       Right eye: No discharge.        Left eye: No discharge.     Conjunctiva/sclera: Conjunctivae normal.     Pupils: Pupils are equal, round, and reactive to light.  Neck:     Thyroid: No thyromegaly.     Vascular: No JVD.     Trachea: No tracheal deviation.  Cardiovascular:     Rate and Rhythm: Normal rate and regular rhythm.     Heart sounds: Murmur heard.     No friction rub. No gallop.   Pulmonary:     Effort: Pulmonary effort is normal. No respiratory distress.     Breath sounds: Normal breath sounds. No stridor. No wheezing or rales.  Chest:     Chest wall: No tenderness.  Abdominal:     General: Bowel sounds are normal. There is no distension.     Palpations: Abdomen is soft. There is no mass.     Tenderness: There is no abdominal tenderness. There is no guarding or rebound.  Genitourinary:    Penis: Normal. No tenderness.      Prostate: Normal.     Rectum: Normal. Guaiac result negative.  Musculoskeletal:        General: No tenderness. Normal range of motion.     Cervical back: Normal range of motion and neck supple.     Right lower leg: No edema.     Left lower leg: No edema.  Lymphadenopathy:     Cervical: No cervical adenopathy.  Skin:    General: Skin is warm and dry.     Coloration: Skin is not pale.     Findings: No erythema or rash.  Neurological:     Mental Status: He is alert and oriented to person, place, and time.     Cranial Nerves: No cranial nerve deficit.     Motor: Weakness present. No abnormal muscle tone.     Coordination: Coordination normal.     Gait: Gait abnormal.     Deep Tendon Reflexes: Reflexes are normal and symmetric. Reflexes normal.  Psychiatric:        Behavior: Behavior normal.        Thought Content: Thought content normal.        Judgment: Judgment normal.    Weak LEs - worse  Lab Results  Component Value Date   WBC 9.2 02/03/2022   HGB 10.6 (L) 02/03/2022   HCT 31.7 (L) 02/03/2022   PLT 196 02/03/2022   GLUCOSE 126 (H) 02/03/2022   CHOL 83 02/03/2022   TRIG 89 02/03/2022   HDL 26 (L) 02/03/2022   LDLDIRECT 78.4 11/02/2011   LDLCALC 39 02/03/2022   ALT 17 01/14/2022   AST 23 01/14/2022   NA 136 02/03/2022  K 3.3 (L) 02/03/2022   CL 107 02/03/2022   CREATININE 1.28 (H) 02/03/2022   BUN 14 02/03/2022   CO2 22 02/03/2022   TSH 1.51 07/23/2021   PSA 2.67 01/22/2021   INR 1.1 01/14/2022   HGBA1C 6.9 (H)  02/02/2022    ECHOCARDIOGRAM COMPLETE  Result Date: 02/02/2022    ECHOCARDIOGRAM REPORT   Patient Name:   ONIEL MELESKI Date of Exam: 02/02/2022 Medical Rec #:  [id number]        Height:       69.0 in Accession #:    [id number]       Weight:       200.2 lb Date of Birth:  1935/11/04         BSA:          2.067 m Patient Age:    89 years         BP:           155/70 mmHg Patient Gender: M                HR:           66 bpm. Exam Location:  Inpatient Procedure: 2D Echo, Cardiac Doppler and Color Doppler Indications:    Acute myocardial infarction, unspecified I21.9  History:        Patient has no prior history of Echocardiogram examinations.                 STEMI and CAD, Prior CABG; Risk Factors:Former Smoker,                 Hypertension and Diabetes.  Sonographer:    Greer Pickerel Referring Phys: [id number] JAN A MANSY  Sonographer Comments: Image acquisition challenging due to patient body habitus and Image acquisition challenging due to respiratory motion. IMPRESSIONS  1. Left ventricular ejection fraction, by estimation, is 55%. The left ventricle has low normal function. There is mild left ventricular hypertrophy. Left ventricular diastolic parameters are indeterminate.  2. Right ventricular systolic function is normal. The right ventricular size is normal. There is normal pulmonary artery systolic pressure.  3. The mitral valve is normal in structure. Mild mitral valve regurgitation.  4. AV is thickened, calcified with restricted motion. Peak and mean gradients through the valve are 45 and 23 mm Hg respectively AVA (VTI) is 1.2 cm2 DVI is 0.35 consistent with moderate AS. [name] The aortic valve is tricuspid. Aortic valve regurgitation is  mild.  5. The inferior vena cava is normal in size with greater than 50% respiratory variability, suggesting right atrial pressure of 3 mmHg. FINDINGS  Left Ventricle: Left ventricular ejection fraction, by estimation, is 55%. The left ventricle has low normal function. The  left ventricular internal cavity size was normal in size. There is mild left ventricular hypertrophy. Left ventricular diastolic parameters are indeterminate. Right Ventricle: The right ventricular size is normal. Right vetricular wall thickness was not assessed. Right ventricular systolic function is normal. There is normal pulmonary artery systolic pressure. The tricuspid regurgitant velocity is 1.34 m/s, and with an assumed right atrial pressure of 8 mmHg, the estimated right ventricular systolic pressure is 98.9 mmHg. Left Atrium: Left atrial size was normal in size. Right Atrium: Right atrial size was normal in size. Pericardium: Trivial pericardial effusion is present. Mitral Valve: The mitral valve is normal in structure. Mild mitral valve regurgitation. Tricuspid Valve: The tricuspid valve is normal in structure. Tricuspid valve regurgitation is trivial. Aortic Valve: AV is thickened, calcified  with restricted motion. Peak and mean gradients through the valve are 45 and 23 mm Hg respectively AVA (VTI) is 1.2 cm2 DVI is 0.35 consistent with moderate AS. The aortic valve is tricuspid. Aortic valve regurgitation is mild. Aortic regurgitation PHT measures 442 msec. Aortic valve mean gradient measures 25.0 mmHg. Aortic valve peak gradient measures 41.3 mmHg. Aortic valve area, by VTI measures 1.21 cm. Pulmonic Valve: The pulmonic valve was grossly normal. Pulmonic valve regurgitation is trivial. Aorta: The aortic root and ascending aorta are structurally normal, with no evidence of dilitation. Venous: The inferior vena cava is normal in size with greater than 50% respiratory variability, suggesting right atrial pressure of 3 mmHg. IAS/Shunts: No atrial level shunt detected by color flow Doppler.  LEFT VENTRICLE PLAX 2D LVIDd:         5.60 cm      Diastology LVIDs:         4.50 cm      LV e' medial:    5.28 cm/s LV PW:         1.30 cm      LV E/e' medial:  16.3 LV IVS:        1.00 cm      LV e' lateral:   6.37  cm/s LVOT diam:     2.10 cm      LV E/e' lateral: 13.5 LV SV:         89 LV SV Index:   43 LVOT Area:     3.46 cm  LV Volumes (MOD) LV vol d, MOD A2C: 105.0 ml LV vol d, MOD A4C: 127.0 ml LV vol s, MOD A2C: 70.3 ml LV vol s, MOD A4C: 72.2 ml LV SV MOD A2C:     34.7 ml LV SV MOD A4C:     127.0 ml LV SV MOD BP:      45.5 ml RIGHT VENTRICLE RV S prime:     8.70 cm/s TAPSE (M-mode): 1.3 cm LEFT ATRIUM             Index        RIGHT ATRIUM           Index LA diam:        4.30 cm 2.08 cm/m   RA Area:     22.30 cm LA Vol (A2C):   60.2 ml 29.13 ml/m  RA Volume:   65.50 ml  31.69 ml/m LA Vol (A4C):   80.0 ml 38.71 ml/m LA Biplane Vol: 71.0 ml 34.35 ml/m  AORTIC VALVE AV Area (Vmax):    1.21 cm AV Area (Vmean):   1.10 cm AV Area (VTI):     1.21 cm AV Vmax:           321.50 cm/s AV Vmean:          230.500 cm/s AV VTI:            0.742 m AV Peak Grad:      41.3 mmHg AV Mean Grad:      25.0 mmHg LVOT Vmax:         112.00 cm/s LVOT Vmean:        72.900 cm/s LVOT VTI:          0.258 m LVOT/AV VTI ratio: 0.35 AI PHT:            442 msec  AORTA Ao Root diam: 3.20 cm Ao Asc diam:  2.90 cm MITRAL VALVE  TRICUSPID VALVE MV Area (PHT): 3.48 cm    TR Peak grad:   7.2 mmHg MV Decel Time: 218 msec    TR Vmax:        134.00 cm/s MV E velocity: 86.20 cm/s MV A velocity: 93.10 cm/s  SHUNTS MV E/A ratio:  0.93        Systemic VTI:  0.26 m                            Systemic Diam: 2.10 cm Dorris Carnes MD Electronically signed by Dorris Carnes MD Signature Date/Time: 02/02/2022/11:38:22 AM    Final    DG Chest 2 View  Result Date: 02/01/2022 CLINICAL DATA:  Chest pain since Saturday, especially LEFT chest after eating mild shortness of breath at times EXAM: CHEST - 2 VIEW COMPARISON:  CT chest 05/19/2015 FINDINGS: Normal heart size post median sternotomy. Mediastinal contours and pulmonary vascularity normal. Atherosclerotic calcification aorta. Small calcified pleural plaques RIGHT upper lobe, better demonstrated on prior  CT. Lungs otherwise clear. No pulmonary infiltrate, pleural effusion, or pneumothorax. Osseous structures unremarkable. IMPRESSION: Chronic calcified pleural plaques RIGHT hemithorax; these are BILATERAL on prior CT, favor asbestos exposure. No acute abnormalities. Electronically Signed   By: Lavonia Dana M.D.   On: 02/01/2022 19:43    Assessment & Plan:   Problem List Items Addressed This Visit       Cardiovascular and Mediastinum   NSTEMI (non-ST elevated myocardial infarction) ([place]) - Primary    Recovering D/c Lipitor due to LE weakness Start Livalo 1/2 tablet every other day      Relevant Medications   Pitavastatin Calcium (LIVALO) 4 MG TABS   isosorbide mononitrate (IMDUR) 30 MG 24 hr tablet   telmisartan-hydrochlorothiazide (MICARDIS HCT) 80-25 MG tablet   Other Relevant Orders   CBC with Differential/Platelet   Comprehensive metabolic panel   CK   Lipid panel   Hemoglobin A1c     Endocrine   DM (diabetes mellitus), type 2 with renal complications ([facility])   Relevant Medications   Pitavastatin Calcium (LIVALO) 4 MG TABS   telmisartan-hydrochlorothiazide (MICARDIS HCT) 80-25 MG tablet     Musculoskeletal and Integument   Lumbar foraminal stenosis    D/c Lipitor due to LE weakness Start Livalo 1/2 tablet every other day        Genitourinary   Bladder cancer ([facility])    Recent UTI - treated      Relevant Orders   Hemoglobin A1c     Other   Gait disorder    D/c Lipitor due to LE weakness Start Livalo 1/2 tablet every other day      Fatigue    Chronic fatigue D/c Lipitor due to LE weakness Start Livalo 1/2 tablet every other day      Dyslipidemia    D/c Lipitor due to LE weakness Start Livalo 1/2 tablet every other day      Relevant Medications   Pitavastatin Calcium (LIVALO) 4 MG TABS   Other Relevant Orders   CBC with Differential/Platelet   Comprehensive metabolic panel   CK   Lipid panel      Meds ordered this encounter  Medications    Pitavastatin Calcium (LIVALO) 4 MG TABS    Sig: Take 1 tablet (4 mg total) by mouth daily.    Dispense:  90 tablet    Refill:  3    Intolerant of other statins   isosorbide mononitrate (IMDUR) 30 MG 24  hr tablet    Sig: Take 1 tablet (30 mg total) by mouth daily.    Dispense:  90 tablet    Refill:  3   telmisartan-hydrochlorothiazide (MICARDIS HCT) 80-25 MG tablet    Sig: Take 1 tablet by mouth daily.    Dispense:  90 tablet    Refill:  3    Requesting 1 year supply      Follow-up: Return in about 3 months (around 05/18/2022) for a follow-up visit.  Walker Kehr, MD

## 2022-02-17 NOTE — Assessment & Plan Note (Signed)
Recovering D/c Lipitor due to LE weakness Start Livalo 1/2 tablet every other day

## 2022-02-17 NOTE — Patient Instructions (Signed)
Stop Lipitor (Atorvastatin)due to leg weakness Start Livalo 1/2 tablet every other day when you get it

## 2022-02-17 NOTE — Assessment & Plan Note (Signed)
Chronic fatigue D/c Lipitor due to LE weakness Start Livalo 1/2 tablet every other day

## 2022-02-18 ENCOUNTER — Encounter: Payer: Self-pay | Admitting: Student

## 2022-02-18 ENCOUNTER — Ambulatory Visit: Payer: Medicare Other | Attending: Physician Assistant | Admitting: Student

## 2022-02-18 VITALS — BP 130/70 | HR 80 | Ht 69.0 in | Wt 190.6 lb

## 2022-02-18 DIAGNOSIS — I10 Essential (primary) hypertension: Secondary | ICD-10-CM

## 2022-02-18 DIAGNOSIS — I739 Peripheral vascular disease, unspecified: Secondary | ICD-10-CM | POA: Diagnosis not present

## 2022-02-18 DIAGNOSIS — E785 Hyperlipidemia, unspecified: Secondary | ICD-10-CM

## 2022-02-18 DIAGNOSIS — R2689 Other abnormalities of gait and mobility: Secondary | ICD-10-CM | POA: Diagnosis not present

## 2022-02-18 DIAGNOSIS — I340 Nonrheumatic mitral (valve) insufficiency: Secondary | ICD-10-CM | POA: Diagnosis not present

## 2022-02-18 DIAGNOSIS — I350 Nonrheumatic aortic (valve) stenosis: Secondary | ICD-10-CM

## 2022-02-18 DIAGNOSIS — I351 Nonrheumatic aortic (valve) insufficiency: Secondary | ICD-10-CM

## 2022-02-18 DIAGNOSIS — I2510 Atherosclerotic heart disease of native coronary artery without angina pectoris: Secondary | ICD-10-CM | POA: Diagnosis not present

## 2022-02-18 DIAGNOSIS — I6523 Occlusion and stenosis of bilateral carotid arteries: Secondary | ICD-10-CM

## 2022-02-18 NOTE — Patient Instructions (Signed)
Medication Instructions:  Your physician recommends that you continue on your current medications as directed. Please refer to the Current Medication list given to you today.  *If you need a refill on your cardiac medications before your next appointment, please call your pharmacy*  Please take your blood pressure daily for 2 weeks and send in a MyChart message. Please include heart rates. Please give the office a call if you BP is constantly 130/80 and greater.  HOW TO TAKE YOUR BLOOD PRESSURE: Rest 5 minutes before taking your blood pressure. Don't smoke or drink caffeinated beverages for at least 30 minutes before. Take your blood pressure before (not after) you eat. Sit comfortably with your back supported and both feet on the floor (don't cross your legs). Elevate your arm to heart level on a table or a desk. Use the proper sized cuff. It should fit smoothly and snugly around your bare upper arm. There should be enough room to slip a fingertip under the cuff. The bottom edge of the cuff should be 1 inch above the crease of the elbow. Ideally, take 3 measurements at one sitting and record the average.  Please be sure to wear your support socks that you have.   Lab Work: NONE If you have labs (blood work) drawn today and your tests are completely normal, you will receive your results only by: [place] (if you have MyChart) OR A paper copy in the mail If you have any lab test that is abnormal or we need to change your treatment, we will call you to review the results.   Testing/Procedures: NONE   Follow-Up: At [facility], you and your health needs are our priority.  As part of our continuing mission to provide you with exceptional heart care, we have created designated Provider Care Teams.  These Care Teams include your primary Cardiologist (physician) and Advanced Practice Providers (APPs -  Physician Assistants and Nurse Practitioners) who all work together to  provide you with the care you need, when you need it.  We recommend signing up for the patient portal called "MyChart".  Sign up information is provided on this After Visit Summary.  MyChart is used to connect with patients for Virtual Visits (Telemedicine).  Patients are able to view lab/test results, encounter notes, upcoming appointments, etc.  Non-urgent messages can be sent to your provider as well.   To learn more about what you can do with MyChart, go to [email or web address].    Your next appointment:   3 month(s)  Provider:   Lauree Chandler, MD

## 2022-02-22 ENCOUNTER — Telehealth: Payer: Self-pay | Admitting: Internal Medicine

## 2022-02-22 ENCOUNTER — Other Ambulatory Visit: Payer: Self-pay | Admitting: Internal Medicine

## 2022-02-22 MED ORDER — PITAVASTATIN CALCIUM 4 MG PO TABS: 4.0000 mg | ORAL_TABLET | Freq: Every day | ORAL | 3 refills | Status: DC

## 2022-02-22 NOTE — Telephone Encounter (Signed)
Pt called about his Rx was too expensive and stated Dr. Lauretta Chester can order it from [facility] for cheaper and mail to his address and pt is unsure about the name of the Rx   Can you please give pt a call back with update

## 2022-02-22 NOTE — Telephone Encounter (Signed)
Called pt he states the med was used for cholesterol. Inform pt rx was Pitavastatin. Will send rx to [facility] in [facility]...Jeff Burgess

## 2022-02-23 DIAGNOSIS — C672 Malignant neoplasm of lateral wall of bladder: Secondary | ICD-10-CM | POA: Diagnosis not present

## 2022-02-23 DIAGNOSIS — Z5111 Encounter for antineoplastic chemotherapy: Secondary | ICD-10-CM | POA: Diagnosis not present

## 2022-02-25 ENCOUNTER — Other Ambulatory Visit: Payer: Self-pay | Admitting: *Deleted

## 2022-02-25 DIAGNOSIS — M79606 Pain in leg, unspecified: Secondary | ICD-10-CM

## 2022-03-01 ENCOUNTER — Encounter: Payer: Medicare Other | Admitting: Vascular Surgery

## 2022-03-02 DIAGNOSIS — Z5111 Encounter for antineoplastic chemotherapy: Secondary | ICD-10-CM | POA: Diagnosis not present

## 2022-03-02 DIAGNOSIS — C672 Malignant neoplasm of lateral wall of bladder: Secondary | ICD-10-CM | POA: Diagnosis not present

## 2022-03-09 DIAGNOSIS — C672 Malignant neoplasm of lateral wall of bladder: Secondary | ICD-10-CM | POA: Diagnosis not present

## 2022-03-09 DIAGNOSIS — Z5111 Encounter for antineoplastic chemotherapy: Secondary | ICD-10-CM | POA: Diagnosis not present

## 2022-03-10 ENCOUNTER — Encounter: Payer: Self-pay | Admitting: Gastroenterology

## 2022-03-11 ENCOUNTER — Ambulatory Visit (HOSPITAL_COMMUNITY)
Admission: RE | Admit: 2022-03-11 | Discharge: 2022-03-11 | Disposition: A | Discharge status: Home / Self Care | Payer: Medicare Other | Source: Ambulatory Visit | Attending: Vascular Surgery | Admitting: Vascular Surgery

## 2022-03-11 ENCOUNTER — Ambulatory Visit: Payer: Medicare Other | Admitting: Vascular Surgery

## 2022-03-11 ENCOUNTER — Encounter: Payer: Self-pay | Admitting: Vascular Surgery

## 2022-03-11 VITALS — BP 136/67 | HR 65 | Temp 98.0°F | Resp 20 | Ht 69.0 in | Wt 189.4 lb

## 2022-03-11 DIAGNOSIS — I70219 Atherosclerosis of native arteries of extremities with intermittent claudication, unspecified extremity: Secondary | ICD-10-CM | POA: Diagnosis not present

## 2022-03-11 DIAGNOSIS — M79606 Pain in leg, unspecified: Secondary | ICD-10-CM | POA: Diagnosis not present

## 2022-03-11 DIAGNOSIS — I739 Peripheral vascular disease, unspecified: Secondary | ICD-10-CM

## 2022-03-11 LAB — VAS US ABI WITH/WO TBI
Left ABI: 0.99
Right ABI: 1.13

## 2022-03-11 NOTE — Progress Notes (Addendum)
ASSESSMENT & PLAN   PERIPHERAL ARTERIAL DISEASE: The patient does have evidence of tibial artery occlusive disease bilaterally that is mild.  I do not think his symptoms can be attributed to his peripheral arterial disease.  I think it may be a combination of things.  He had previous back surgery.  He has a history of neuropathy.  He also sounds like he is having some issues with arthritis in his knees as his knees become stiff when he stands for a long time.  From a vascular standpoint he quit smoking in 1973.  I encouraged him to stay as active as possible.  Currently I do not think he needs further workup for his peripheral arterial disease.  Likewise I do not see any evidence of significant venous insufficiency.  UNSTEADY GAIT: Patient also has issues with unsteady gait.  I did not detect carotid bruits.  However if his workup is unremarkable and no other etiology can be found certainly we could get a carotid duplex scan to rule out vertebrobasilar insufficiency.  REASON FOR CONSULT:    Claudication.  The consult was requested by Mayra Reel, NP  HPI:   Jeff Burgess is a 87 y.o. male who was referred with peripheral arterial disease and claudication.   I reviewed his old records.  He was seen by Dr. Ruta Hinds on 11/24/2017 with bilateral leg weakness.  At that time, he had essentially normal ABIs.  He had some evidence of tibial artery disease only.  It was felt that he likely had pseudoclaudication from back issues.  He was to be seen as needed.  On my history, the patient describes issues with unsteady gait which has been going on for several months.  This did not come on suddenly.  He also describes some paresthesias in his feet.  I do not get any clear-cut history of claudication.  He denies any history of rest pain or nonhealing ulcers.  He had no previous history of DVT.  He quit smoking in 1973.  He is on aspirin.  He is allergic to statins.  Past Medical History:   Diagnosis Date   Benign localized prostatic hyperplasia with lower urinary tract symptoms (LUTS)    Bladder cancer [facility]) dx 05/ 2017--  urologist- dr Pilar Jarvis   TCC , Ta of bladder  s/p TURBT 06-02-2015   CAD (coronary artery disease)    no cardiologist--  followed by pcp,  dr plontnikov   Chronic low back pain with sciatica    CKD (chronic kidney disease), stage III ([place])    Diabetes mellitus, type 2 ([place])    ED (erectile dysfunction)    Full dentures    History of ETT    08-09-2014   fair exercise tolerance, no chest pain, normal BP response, no ST changes (baseline RBBB);  negative adequate ETT   Hyperlipidemia    Hypertension    Hypogonadism male    OA (osteoarthritis)    Right bundle branch block (RBBB)    S/P CABG x 1    1998   Sigmoid diverticulosis    Ventral hernia    midline   Wears glasses     Family History  Problem Relation Age of Onset   Arthritis Mother 72       RA   Alcohol abuse Father 14       exposure   Diabetes Brother    Kidney disease Brother    Heart disease Brother    Hypertension Other  Colon cancer Neg Hx    Esophageal cancer Neg Hx    Rectal cancer Neg Hx    Stomach cancer Neg Hx     SOCIAL HISTORY: Social History   Tobacco Use   Smoking status: Former    Years: 15.00    Types: Cigarettes    Quit date: 01/18/1971    Years since quitting: 51.1   Smokeless tobacco: Current    Types: Snuff  Substance Use Topics   Alcohol use: No    Allergies  Allergen Reactions   Atorvastatin     Weak legs   Coreg [Carvedilol] Other (See Comments)    weakness   Lovastatin     Leg weakness   Morphine Sulfate Other (See Comments)    Per the pt: "After taking morphine, within 30 mins, I was soaking wet. I did not like the way it made me feel."    Current Outpatient Medications  Medication Sig Dispense Refill   amLODipine (NORVASC) 5 MG tablet TAKE 1 TABLET BY MOUTH  DAILY (Patient taking differently: Take 5 mg by mouth daily.) 90 tablet 3    aspirin 81 MG tablet Take 2 tablets (162 mg total) by mouth daily. (Patient taking differently: Take 81 mg by mouth daily.) 100 tablet 3   cholecalciferol (VITAMIN D) 25 MCG (1000 UT) tablet Take 1 tablet (1,000 Units total) by mouth every morning. Follow-up due in April must see provider for refills (Patient taking differently: Take 1,000 Units by mouth every morning.) 90 tablet 0   Cinnamon 500 MG capsule Take 200-500 mg by mouth 3 (three) times a week.     HYDROcodone-acetaminophen (NORCO/VICODIN) 5-325 MG tablet Take 1 tablet by mouth every 6 (six) hours as needed for moderate pain or severe pain.     isosorbide mononitrate (IMDUR) 30 MG 24 hr tablet Take 1 tablet (30 mg total) by mouth daily. 90 tablet 3   metFORMIN (GLUCOPHAGE-XR) 500 MG 24 hr tablet TAKE 1 TABLET BY MOUTH  TWICE DAILY (Patient taking differently: Take 500 mg by mouth 2 (two) times daily with a meal.) 200 tablet 2   telmisartan-hydrochlorothiazide (MICARDIS HCT) 80-25 MG tablet Take 1 tablet by mouth daily. 90 tablet 3   TYLENOL 500 MG tablet Take 500-1,000 mg by mouth every 8 (eight) hours as needed (for pain).     ZYPITAMAG 4 MG TABS 1 PO QD 90 tablet 3   No current facility-administered medications for this visit.    REVIEW OF SYSTEMS:  '[X]'$  denotes positive finding, '[ ]'$  denotes negative finding Cardiac  Comments:  Chest pain or chest pressure:    Shortness of breath upon exertion:    Short of breath when lying flat:    Irregular heart rhythm:        Vascular    Pain in calf, thigh, or hip brought on by ambulation:    Pain in feet at night that wakes you up from your sleep:     Blood clot in your veins:    Leg swelling:  x       Pulmonary    Oxygen at home:    Productive cough:     Wheezing:         Neurologic    Sudden weakness in arms or legs:     Sudden numbness in arms or legs:     Sudden onset of difficulty speaking or slurred speech:    Temporary loss of vision in one eye:     Problems with  dizziness:         Gastrointestinal    Blood in stool:     Vomited blood:         Genitourinary    Burning when urinating:     Blood in urine:        Psychiatric    Major depression:         Hematologic    Bleeding problems:    Problems with blood clotting too easily:        Skin    Rashes or ulcers:        Constitutional    Fever or chills:    -  PHYSICAL EXAM:   Vitals:   03/11/22 1513  BP: 136/67  Pulse: 65  Resp: 20  Temp: 98 F (36.7 C)  SpO2: 95%  Weight: 189 lb 6.4 oz (85.9 kg)  Height: '5\' 9"'$  (1.753 m)   Body mass index is 27.97 kg/m.  GENERAL: The patient is a well-nourished male, in no acute distress. The vital signs are documented above. CARDIAC: There is a regular rate and rhythm.  He has a systolic ejection murmur. VASCULAR: I do not detect carotid bruits. He has palpable femoral and popliteal pulses bilaterally. I cannot palpate pedal pulses. PULMONARY: There is good air exchange bilaterally without wheezing or rales. ABDOMEN: Soft and non-tender with normal pitched bowel sounds.  MUSCULOSKELETAL: There are no major deformities. NEUROLOGIC: No focal weakness or paresthesias are detected. SKIN: There are no ulcers or rashes noted. PSYCHIATRIC: The patient has a normal affect.  DATA:    ARTERIAL DOPPLER STUDY: I have independently interpreted his arterial Doppler study today.  On the right side there is a biphasic posterior tibial signal and biphasic dorsalis pedis signal.  ABIs 100%.  Toe pressure is 76 mmHg.  On the left side there is a monophasic posterior tibial signal with a triphasic dorsalis pedis signal.  ABIs 99%.  Toe pressures 51 mmHg.  LABS: I reviewed his labs from 02/03/2022.  Creatinine was 1.28.  GFR was 55.  Deitra Mayo [facility]

## 2022-03-16 DIAGNOSIS — C672 Malignant neoplasm of lateral wall of bladder: Secondary | ICD-10-CM | POA: Diagnosis not present

## 2022-03-16 DIAGNOSIS — Z5111 Encounter for antineoplastic chemotherapy: Secondary | ICD-10-CM | POA: Diagnosis not present

## 2022-04-06 ENCOUNTER — Other Ambulatory Visit: Payer: Self-pay

## 2022-04-26 DIAGNOSIS — M1712 Unilateral primary osteoarthritis, left knee: Secondary | ICD-10-CM | POA: Diagnosis not present

## 2022-05-04 DIAGNOSIS — R310 Gross hematuria: Secondary | ICD-10-CM | POA: Diagnosis not present

## 2022-05-04 DIAGNOSIS — C672 Malignant neoplasm of lateral wall of bladder: Secondary | ICD-10-CM | POA: Diagnosis not present

## 2022-05-04 DIAGNOSIS — R319 Hematuria, unspecified: Secondary | ICD-10-CM | POA: Diagnosis not present

## 2022-05-14 ENCOUNTER — Other Ambulatory Visit: Payer: Self-pay | Admitting: Internal Medicine

## 2022-05-20 ENCOUNTER — Ambulatory Visit (INDEPENDENT_AMBULATORY_CARE_PROVIDER_SITE_OTHER): Payer: Medicare Other

## 2022-05-20 VITALS — Ht 69.0 in | Wt 185.0 lb

## 2022-05-20 DIAGNOSIS — Z0000 Encounter for general adult medical examination without abnormal findings: Secondary | ICD-10-CM

## 2022-05-20 NOTE — Progress Notes (Addendum)
I connected with  Georgana Curio on 05/20/22 by a audio enabled telemedicine application and verified that I am speaking with the correct person using two identifiers.  Patient Location: Skilled Nursing Facility  Provider Location: Office/Clinic  I discussed the limitations of evaluation and management by telemedicine. The patient expressed understanding and agreed to proceed.  Subjective:   LAURIER CAMBRON is a 87 y.o. male who presents for Medicare Annual/Subsequent preventive examination.  Review of Systems     Cardiac Risk Factors include: advanced age (>65men, >49 women);diabetes mellitus;dyslipidemia;family history of premature cardiovascular disease;hypertension;male gender;sedentary lifestyle     Objective:    Today's Vitals   05/20/22 0852  Weight: 185 lb (83.9 kg)  Height: 5\' 9"  (1.753 m)  PainSc: 0-No pain   Body mass index is 27.32 kg/m.     05/20/2022    8:55 AM 02/01/2022    6:40 PM 01/14/2022    5:00 PM 01/14/2022   12:41 AM 05/18/2021    8:52 AM 06/27/2019   11:31 AM 09/19/2017    8:30 AM  Advanced Directives  Does Patient Have a Medical Advance Directive? Yes No Yes No No No Yes  Type of Estate agent of [place];Living will  Healthcare Power of [place];Living will    Healthcare Power of [place];Living will  Does patient want to make changes to medical advance directive?  No - Patient declined No - Patient declined  No - Patient declined    Copy of Healthcare Power of Attorney in Chart? No - copy requested  No - copy requested      Would patient like information on creating a medical advance directive?  No - Patient declined No - Patient declined No - Patient declined  No - Patient declined     Current Medications (verified) Outpatient Encounter Medications as of 05/20/2022  Medication Sig   amLODipine (NORVASC) 5 MG tablet TAKE 1 TABLET BY MOUTH  DAILY (Patient taking differently: Take 5 mg by mouth daily.)   aspirin 81 MG tablet Take 2  tablets (162 mg total) by mouth daily. (Patient taking differently: Take 81 mg by mouth daily.)   cholecalciferol (VITAMIN D) 25 MCG (1000 UT) tablet Take 1 tablet (1,000 Units total) by mouth every morning. Follow-up due in April must see provider for refills (Patient taking differently: Take 1,000 Units by mouth every morning.)   Cinnamon 500 MG capsule Take 200-500 mg by mouth 3 (three) times a week.   HYDROcodone-acetaminophen (NORCO/VICODIN) 5-325 MG tablet Take 1 tablet by mouth every 6 (six) hours as needed for moderate pain or severe pain.   isosorbide mononitrate (IMDUR) 30 MG 24 hr tablet Take 1 tablet (30 mg total) by mouth daily.   metFORMIN (GLUCOPHAGE-XR) 500 MG 24 hr tablet TAKE 1 TABLET BY MOUTH  TWICE DAILY (Patient taking differently: Take 500 mg by mouth 2 (two) times daily with a meal.)   telmisartan-hydrochlorothiazide (MICARDIS HCT) 80-25 MG tablet Take 1 tablet by mouth daily.   TYLENOL 500 MG tablet Take 500-1,000 mg by mouth every 8 (eight) hours as needed (for pain).   ZYPITAMAG 4 MG TABS 1 PO QD   No facility-administered encounter medications on file as of 05/20/2022.    Allergies (verified) Atorvastatin, Coreg [carvedilol], Lovastatin, and Morphine sulfate   History: Past Medical History:  Diagnosis Date   Benign localized prostatic hyperplasia with lower urinary tract symptoms (LUTS)    Bladder cancer [facility]) dx 05/ 2017--  urologist- dr Sherryl Barters   TCC ,  Ta of bladder  s/p TURBT 06-02-2015   CAD (coronary artery disease)    no cardiologist--  followed by pcp,  dr plontnikov   Chronic low back pain with sciatica    CKD (chronic kidney disease), stage III (HCC)    Diabetes mellitus, type 2 (HCC)    ED (erectile dysfunction)    Full dentures    History of ETT    08-09-2014   fair exercise tolerance, no chest pain, normal BP response, no ST changes (baseline RBBB);  negative adequate ETT   Hyperlipidemia    Hypertension    Hypogonadism male    OA (osteoarthritis)     Right bundle branch block (RBBB)    S/P CABG x 1    1998   Sigmoid diverticulosis    Ventral hernia    midline   Wears glasses    Past Surgical History:  Procedure Laterality Date   COLONOSCOPY WITH PROPOFOL  02-01-2012   CORONARY ARTERY BYPASS GRAFT  1998  at [facility]   x1  vessel    CYSTOSCOPY WITH BIOPSY N/A 10/08/2015   Procedure: CYSTOSCOPY WITH BLADDER BIOPSY;  Surgeon: Hildred Laser, MD;  Location: [facility];  Service: Urology;  Laterality: N/A;   CYSTOSCOPY WITH FULGERATION N/A 10/08/2015   Procedure: CYSTOSCOPY WITH FULGERATION;  Surgeon: Hildred Laser, MD;  Location: [facility];  Service: Urology;  Laterality: N/A;   CYSTOSCOPY WITH FULGERATION N/A 01/14/2022   Procedure: CYSTOSCOPY WITH FULGERATION;  Surgeon: Crista Elliot, MD;  Location: [facility];  Service: Urology;  Laterality: N/A;   TONSILLECTOMY  age 9   TRANSURETHRAL RESECTION OF BLADDER TUMOR WITH GYRUS (TURBT-GYRUS) N/A 06/02/2015   Procedure: TRANSURETHRAL RESECTION OF BLADDER TUMOR WITH GYRUS (TURBT-GYRUS);  Surgeon: Hildred Laser, MD;  Location: [facility];  Service: Urology;  Laterality: N/A;   Family History  Problem Relation Age of Onset   Arthritis Mother 37       RA   Alcohol abuse Father 51       exposure   Diabetes Brother    Kidney disease Brother    Heart disease Brother    Hypertension Other    Colon cancer Neg Hx    Esophageal cancer Neg Hx    Rectal cancer Neg Hx    Stomach cancer Neg Hx    Social History   Socioeconomic History   Marital status: Married    Spouse name: Not on file   Number of children: Not on file   Years of education: Not on file   Highest education level: Not on file  Occupational History   Occupation: retired    Comment: but working fulltime  Tobacco Use   Smoking status: Former    Years: 15    Types: Cigarettes    Quit date: 01/18/1971    Years since quitting: 51.3   Smokeless  tobacco: Current    Types: Snuff  Vaping Use   Vaping Use: Never used  Substance and Sexual Activity   Alcohol use: No   Drug use: No   Sexual activity: Not Currently  Other Topics Concern   Not on file  Social History Narrative   Regular exercise-yes   Social Determinants of Health   Financial Resource Strain: Low Risk  (05/20/2022)   Overall Financial Resource Strain (CARDIA)    Difficulty of Paying Living Expenses: Not hard at all  Food Insecurity: No Food Insecurity (05/20/2022)   Hunger Vital  Sign    Worried About Programme researcher, broadcasting/film/video in the Last Year: Never true    Ran Out of Food in the Last Year: Never true  Transportation Needs: No Transportation Needs (05/20/2022)   PRAPARE - Administrator, Civil Service (Medical): No    Lack of Transportation (Non-Medical): No  Physical Activity: Inactive (05/20/2022)   Exercise Vital Sign    Days of Exercise per Week: 0 days    Minutes of Exercise per Session: 0 min  Stress: No Stress Concern Present (05/20/2022)   [employer] of Occupational Health - Occupational Stress Questionnaire    Feeling of Stress : Not at all  Social Connections: Socially Integrated (05/20/2022)   Social Connection and Isolation Panel [NHANES]    Frequency of Communication with Friends and Family: More than three times a week    Frequency of Social Gatherings with Friends and Family: More than three times a week    Attends Religious Services: More than 4 times per year    Active Member of [employer] or Organizations: Yes    Attends Engineer, structural: More than 4 times per year    Marital Status: Married    Tobacco Counseling Ready to quit: Not Answered Counseling given: Not Answered   Clinical Intake:  Pre-visit preparation completed: Yes  Pain : No/denies pain Pain Score: 0-No pain     BMI - recorded: 27.32 Nutritional Status: BMI 25 -29 Overweight Nutritional Risks: None Diabetes: No  How often do you need to have  someone help you when you read instructions, pamphlets, or other written materials from your doctor or pharmacy?: 1 - Never What is the last grade level you completed in school?: HSG  Nutrition Risk Assessment:  Has the patient had any N/V/D within the last 2 months?  No  Does the patient have any non-healing wounds?  No  Has the patient had any unintentional weight loss or weight gain?  No   Diabetes:  Is the patient diabetic?  Yes  If diabetic, was a CBG obtained today?  No  Did the patient bring in their glucometer from home?  No  How often do you monitor your CBG's? No.   Financial Strains and Diabetes Management:  Are you having any financial strains with the device, your supplies or your medication? No .  Does the patient want to be seen by Chronic Care Management for management of their diabetes?  No  Would the patient like to be referred to a Nutritionist or for Diabetic Management?  No   Diabetic Exams:  Diabetic Eye Exam: Overdue for diabetic eye exam. Pt has been advised about the importance in completing this exam. Patient advised to call and schedule an eye exam. Diabetic Foot Exam: Overdue, Pt has been advised about the importance in completing this exam. Pt is scheduled for diabetic foot exam on 05/24/2022.   Interpreter Needed?: No  Information entered by :: Susie Cassette, LPN.   Activities of Daily Living    05/20/2022    8:59 AM 02/02/2022    5:00 AM  In your present state of health, do you have any difficulty performing the following activities:  Hearing? 0 1  Vision? 0 0  Difficulty concentrating or making decisions? 0 0  Walking or climbing stairs? 0 1  Dressing or bathing? 0 0  Doing errands, shopping? 0 0  Preparing Food and eating ? N   Using the Toilet? N   In the past six months,  have you accidently leaked urine? N   Do you have problems with loss of bowel control? N   Managing your Medications? N   Managing your Finances? N   Housekeeping  or managing your Housekeeping? N     Patient Care Team: Plotnikov, Georgina Quint, MD as PCP - General [place] James Nile Dear, MD as PCP - Cardiology (Cardiology) Maeola Harman, MD as Consulting Physician (Neurosurgery) Szabat, Vinnie Level, [facility] (Inactive) as Pharmacist (Pharmacist)  Indicate any recent Medical Services you may have received from other than [facility] providers in the past year (date may be approximate).     Assessment:   This is a routine wellness examination for [place].  Hearing/Vision screen Hearing Screening - Comments:: Denies hearing difficulties   Vision Screening - Comments:: Wears rx glasses - up to date with routine eye exams with [facility]   Dietary issues and exercise activities discussed: Current Exercise Habits: The patient does not participate in regular exercise at present, Exercise limited by: orthopedic condition(s);Other - see comments (gait disorder)   Goals Addressed             This Visit's Progress    Maintain my health, continue my check ups on my bladder cancer and staying independent.        Depression Screen    05/20/2022    8:58 AM 02/17/2022    9:24 AM 05/18/2021    8:59 AM 04/23/2021   10:37 AM 04/01/2020   11:29 AM 04/01/2020   11:27 AM 09/25/2018   10:07 AM  PHQ 2/9 Scores  PHQ - 2 Score 0 0 0 0 0 0 0  PHQ- 9 Score 0    0      Fall Risk    05/20/2022    8:54 AM 02/17/2022    9:24 AM 05/18/2021    8:54 AM 04/23/2021   10:36 AM 10/22/2020    1:59 PM  Fall Risk   Falls in the past year? 0 0 1 0 1  Number falls in past yr: 0 0 0 0 1  Injury with Fall? 0 0 0 0 1  Risk for fall due to : No Fall Risks No Fall Risks Impaired balance/gait No Fall Risks;Impaired balance/gait History of fall(s);Impaired balance/gait  Follow up Falls prevention discussed Falls evaluation completed Falls prevention discussed Falls evaluation completed     FALL RISK PREVENTION PERTAINING TO THE HOME:  Any stairs in or around the home? No  If  so, are there any without handrails? No  Home free of loose throw rugs in walkways, pet beds, electrical cords, etc? Yes  Adequate lighting in your home to reduce risk of falls? Yes   ASSISTIVE DEVICES UTILIZED TO PREVENT FALLS:  Life alert? No  Use of a cane, walker or w/c? Yes  Grab bars in the bathroom? Yes  Shower chair or bench in shower? Yes  Elevated toilet seat or a handicapped toilet? Yes   TIMED UP AND GO:  Was the test performed? No . Telephonic Visit  Cognitive Function:    09/16/2016    9:58 AM  MMSE - Mini Mental State Exam  Orientation to time 5  Orientation to Place 5  Registration 3  Attention/ Calculation 5  Recall 2  Language- name 2 objects 2  Language- repeat 1  Language- follow 3 step command 3  Language- read & follow direction 1  Write a sentence 1  Copy design 1  Total score 29  05/20/2022    8:57 AM 05/18/2021    9:09 AM  6CIT Screen  What Year? 0 points 0 points  What month? 0 points 0 points  What time? 0 points 0 points  Count back from 20 0 points 0 points  Months in reverse 0 points 0 points  Repeat phrase 0 points 0 points  Total Score 0 points 0 points    Immunizations Immunization History  Administered Date(s) Administered   Fluad Quad(high Dose 65+) 09/25/2018, 09/25/2019, 10/22/2020, 10/26/2021   Influenza Split 11/11/2011   Influenza Whole 11/05/2005   Influenza, High Dose Seasonal PF 12/08/2015, 09/16/2016, 09/19/2017   Influenza,inj,Quad PF,6+ Mos 10/05/2012, 09/30/2014   Moderna Sars-Covid-2 Vaccination 02/15/2019, 03/15/2019   PFIZER(Purple Top)SARS-COV-2 Vaccination 12/12/2019   Pneumococcal Conjugate-13 04/04/2013   Pneumococcal Polysaccharide-23 07/02/2005, 12/16/2016   Td 11/11/2011    TDAP status: Due, Education has been provided regarding the importance of this vaccine. Advised may receive this vaccine at local pharmacy or Health Dept. Aware to provide a copy of the vaccination record if obtained from local  pharmacy or Health Dept. Verbalized acceptance and understanding.  Flu Vaccine status: Up to date  Pneumococcal vaccine status: Up to date  Covid-19 vaccine status: Completed vaccines  Qualifies for Shingles Vaccine? Yes   Zostavax completed No   Shingrix Completed?: No.    Education has been provided regarding the importance of this vaccine. Patient has been advised to call insurance company to determine out of pocket expense if they have not yet received this vaccine. Advised may also receive vaccine at local pharmacy or Health Dept. Verbalized acceptance and understanding.  Screening Tests Health Maintenance  Topic Date Due   Diabetic kidney evaluation - Urine ACR  Never done   FOOT EXAM  09/20/2018   COVID-19 Vaccine (4 - 2023-24 season) 09/11/2021   OPHTHALMOLOGY EXAM  09/17/2021   DTaP/Tdap/Td (2 - Tdap) 11/10/2021   INFLUENZA VACCINE  08/12/2022   Diabetic kidney evaluation - eGFR measurement  02/04/2023   Medicare Annual Wellness (AWV)  05/20/2023   Pneumonia Vaccine 71+ Years old  Completed   HPV VACCINES  Aged Out   Zoster Vaccines- Shingrix  Discontinued    Health Maintenance  Health Maintenance Due  Topic Date Due   Diabetic kidney evaluation - Urine ACR  Never done   FOOT EXAM  09/20/2018   COVID-19 Vaccine (4 - 2023-24 season) 09/11/2021   OPHTHALMOLOGY EXAM  09/17/2021   DTaP/Tdap/Td (2 - Tdap) 11/10/2021    Colorectal cancer screening: No longer required.   Lung Cancer Screening: (Low Dose CT Chest recommended if Age 34-80 years, 30 pack-year currently smoking OR have quit w/in 15years.) does not qualify.   Lung Cancer Screening Referral: no  Additional Screening:  Hepatitis C Screening: does not qualify; Completed: no  Vision Screening: Recommended annual ophthalmology exams for early detection of glaucoma and other disorders of the eye. Is the patient up to date with their annual eye exam?  No  Who is the provider or what is the name of the office  in which the patient attends annual eye exams? [facility] If pt is not established with a provider, would they like to be referred to a provider to establish care? No .   Dental Screening: Recommended annual dental exams for proper oral hygiene  Community Resource Referral / Chronic Care Management: CRR required this visit?  No   CCM required this visit?  No      Plan:  I have personally reviewed and noted the following in the patient's chart:   Medical and social history Use of alcohol, tobacco or illicit drugs  Current medications and supplements including opioid prescriptions. Patient is currently taking opioid prescriptions. Information provided to patient regarding non-opioid alternatives. Patient advised to discuss non-opioid treatment plan with their provider. Functional ability and status Nutritional status Physical activity Advanced directives List of other physicians Hospitalizations, surgeries, and ER visits in previous 12 months Vitals Screenings to include cognitive, depression, and falls Referrals and appointments  In addition, I have reviewed and discussed with patient certain preventive protocols, quality metrics, and best practice recommendations. A written personalized care plan for preventive services as well as general preventive health recommendations were provided to patient.     Mickeal Needy, LPN   0/09/8117   Nurse Notes:  Normal cognitive status assessed by direct observation via telephone conversation by this Nurse Health Advisor. No abnormalities found.   Medical screening examination/treatment/procedure(s) were performed by non-physician practitioner and as supervising physician I was immediately available for consultation/collaboration.  I agree with above. Jacinta Shoe, MD

## 2022-05-20 NOTE — Patient Instructions (Addendum)
[name] , Thank you for taking time to come for your Medicare Wellness Visit. I appreciate your ongoing commitment to your health goals. Please review the following plan we discussed and let me know if I can assist you in the future.   These are the goals we discussed:  Goals      Maintain my health, continue my check ups on my bladder cancer and staying independent.        This is a list of the screening recommended for you and due dates:  Health Maintenance  Topic Date Due   Yearly kidney health urinalysis for diabetes  Never done   Complete foot exam   09/20/2018   COVID-19 Vaccine (4 - 2023-24 season) 09/11/2021   Eye exam for diabetics  09/17/2021   DTaP/Tdap/Td vaccine (2 - Tdap) 11/10/2021   Flu Shot  08/12/2022   Yearly kidney function blood test for diabetes  02/04/2023   Medicare Annual Wellness Visit  05/20/2023   Pneumonia Vaccine  Completed   HPV Vaccine  Aged Out   Zoster (Shingles) Vaccine  Discontinued    Advanced directives: Yes  Conditions/risks identified: Yes  Next appointment: Follow up in one year for your annual wellness visit.   Preventive Care 85 Years and Older, Male  Preventive care refers to lifestyle choices and visits with your health care provider that can promote health and wellness. What does preventive care include? A yearly physical exam. This is also called an annual well check. Dental exams once or twice a year. Routine eye exams. Ask your health care provider how often you should have your eyes checked. Personal lifestyle choices, including: Daily care of your teeth and gums. Regular physical activity. Eating a healthy diet. Avoiding tobacco and drug use. Limiting alcohol use. Practicing safe sex. Taking low doses of aspirin every day. Taking vitamin and mineral supplements as recommended by your health care provider. What happens during an annual well check? The services and screenings done by your health care provider during  your annual well check will depend on your age, overall health, lifestyle risk factors, and family history of disease. Counseling  Your health care provider may ask you questions about your: Alcohol use. Tobacco use. Drug use. Emotional well-being. Home and relationship well-being. Sexual activity. Eating habits. History of falls. Memory and ability to understand (cognition). Work and work Astronomer. Screening  You may have the following tests or measurements: Height, weight, and BMI. Blood pressure. Lipid and cholesterol levels. These may be checked every 5 years, or more frequently if you are over 6 years old. Skin check. Lung cancer screening. You may have this screening every year starting at age 26 if you have a 30-pack-year history of smoking and currently smoke or have quit within the past 15 years. Fecal occult blood test (FOBT) of the stool. You may have this test every year starting at age 26. Flexible sigmoidoscopy or colonoscopy. You may have a sigmoidoscopy every 5 years or a colonoscopy every 10 years starting at age 59. Prostate cancer screening. Recommendations will vary depending on your family history and other risks. Hepatitis C blood test. Hepatitis B blood test. Sexually transmitted disease (STD) testing. Diabetes screening. This is done by checking your blood sugar (glucose) after you have not eaten for a while (fasting). You may have this done every 1-3 years. Abdominal aortic aneurysm (AAA) screening. You may need this if you are a current or former smoker. Osteoporosis. You may be screened starting at age  85 if you are at high risk. Talk with your health care provider about your test results, treatment options, and if necessary, the need for more tests. Vaccines  Your health care provider may recommend certain vaccines, such as: Influenza vaccine. This is recommended every year. Tetanus, diphtheria, and acellular pertussis (Tdap, Td) vaccine. You may need  a Td booster every 10 years. Zoster vaccine. You may need this after age 85. Pneumococcal 13-valent conjugate (PCV13) vaccine. One dose is recommended after age 85. Pneumococcal polysaccharide (PPSV23) vaccine. One dose is recommended after age 85. Talk to your health care provider about which screenings and vaccines you need and how often you need them. This information is not intended to replace advice given to you by your health care provider. Make sure you discuss any questions you have with your health care provider. Document Released: 01/24/2015 Document Revised: 09/17/2015 Document Reviewed: 10/29/2014 Elsevier Interactive Patient Education  2017 [employer] Prevention in the Home Falls can cause injuries. They can happen to people of all ages. There are many things you can do to make your home safe and to help prevent falls. What can I do on the outside of my home? Regularly fix the edges of walkways and driveways and fix any cracks. Remove anything that might make you trip as you walk through a door, such as a raised step or threshold. Trim any bushes or trees on the path to your home. Use bright outdoor lighting. Clear any walking paths of anything that might make someone trip, such as rocks or tools. Regularly check to see if handrails are loose or broken. Make sure that both sides of any steps have handrails. Any raised decks and porches should have guardrails on the edges. Have any leaves, snow, or ice cleared regularly. Use sand or salt on walking paths during winter. Clean up any spills in your garage right away. This includes oil or grease spills. What can I do in the bathroom? Use night lights. Install grab bars by the toilet and in the tub and shower. Do not use towel bars as grab bars. Use non-skid mats or decals in the tub or shower. If you need to sit down in the shower, use a plastic, non-slip stool. Keep the floor dry. Clean up any water that spills on the  floor as soon as it happens. Remove soap buildup in the tub or shower regularly. Attach bath mats securely with double-sided non-slip rug tape. Do not have throw rugs and other things on the floor that can make you trip. What can I do in the bedroom? Use night lights. Make sure that you have a light by your bed that is easy to reach. Do not use any sheets or blankets that are too big for your bed. They should not hang down onto the floor. Have a firm chair that has side arms. You can use this for support while you get dressed. Do not have throw rugs and other things on the floor that can make you trip. What can I do in the kitchen? Clean up any spills right away. Avoid walking on wet floors. Keep items that you use a lot in easy-to-reach places. If you need to reach something above you, use a strong step stool that has a grab bar. Keep electrical cords out of the way. Do not use floor polish or wax that makes floors slippery. If you must use wax, use non-skid floor wax. Do not have throw rugs and other  things on the floor that can make you trip. What can I do with my stairs? Do not leave any items on the stairs. Make sure that there are handrails on both sides of the stairs and use them. Fix handrails that are broken or loose. Make sure that handrails are as long as the stairways. Check any carpeting to make sure that it is firmly attached to the stairs. Fix any carpet that is loose or worn. Avoid having throw rugs at the top or bottom of the stairs. If you do have throw rugs, attach them to the floor with carpet tape. Make sure that you have a light switch at the top of the stairs and the bottom of the stairs. If you do not have them, ask someone to add them for you. What else can I do to help prevent falls? Wear shoes that: Do not have high heels. Have rubber bottoms. Are comfortable and fit you well. Are closed at the toe. Do not wear sandals. If you use a stepladder: Make sure that  it is fully opened. Do not climb a closed stepladder. Make sure that both sides of the stepladder are locked into place. Ask someone to hold it for you, if possible. Clearly mark and make sure that you can see: Any grab bars or handrails. First and last steps. Where the edge of each step is. Use tools that help you move around (mobility aids) if they are needed. These include: Canes. Walkers. Scooters. Crutches. Turn on the lights when you go into a dark area. Replace any light bulbs as soon as they burn out. Set up your furniture so you have a clear path. Avoid moving your furniture around. If any of your floors are uneven, fix them. If there are any pets around you, be aware of where they are. Review your medicines with your doctor. Some medicines can make you feel dizzy. This can increase your chance of falling. Ask your doctor what other things that you can do to help prevent falls. This information is not intended to replace advice given to you by your health care provider. Make sure you discuss any questions you have with your health care provider. Document Released: 10/24/2008 Document Revised: 06/05/2015 Document Reviewed: 02/01/2014 Elsevier Interactive Patient Education  2017 [employer].

## 2022-05-24 ENCOUNTER — Ambulatory Visit (INDEPENDENT_AMBULATORY_CARE_PROVIDER_SITE_OTHER): Payer: Medicare Other | Admitting: Internal Medicine

## 2022-05-24 ENCOUNTER — Encounter: Payer: Self-pay | Admitting: Internal Medicine

## 2022-05-24 VITALS — BP 128/64 | HR 91 | Temp 98.0°F | Ht <= 58 in | Wt 184.0 lb

## 2022-05-24 DIAGNOSIS — I214 Non-ST elevation (NSTEMI) myocardial infarction: Secondary | ICD-10-CM | POA: Diagnosis not present

## 2022-05-24 DIAGNOSIS — E785 Hyperlipidemia, unspecified: Secondary | ICD-10-CM | POA: Diagnosis not present

## 2022-05-24 DIAGNOSIS — M5440 Lumbago with sciatica, unspecified side: Secondary | ICD-10-CM

## 2022-05-24 DIAGNOSIS — N1831 Chronic kidney disease, stage 3a: Secondary | ICD-10-CM

## 2022-05-24 DIAGNOSIS — R269 Unspecified abnormalities of gait and mobility: Secondary | ICD-10-CM | POA: Diagnosis not present

## 2022-05-24 DIAGNOSIS — Z794 Long term (current) use of insulin: Secondary | ICD-10-CM

## 2022-05-24 DIAGNOSIS — E1122 Type 2 diabetes mellitus with diabetic chronic kidney disease: Secondary | ICD-10-CM

## 2022-05-24 DIAGNOSIS — G8929 Other chronic pain: Secondary | ICD-10-CM

## 2022-05-24 DIAGNOSIS — C678 Malignant neoplasm of overlapping sites of bladder: Secondary | ICD-10-CM | POA: Diagnosis not present

## 2022-05-24 DIAGNOSIS — R270 Ataxia, unspecified: Secondary | ICD-10-CM

## 2022-05-24 LAB — CBC WITH DIFFERENTIAL/PLATELET
Basophils Absolute: 0.1 10*3/uL (ref 0.0–0.1)
Basophils Relative: 0.6 % (ref 0.0–3.0)
Eosinophils Absolute: 0.1 10*3/uL (ref 0.0–0.7)
Eosinophils Relative: 0.6 % (ref 0.0–5.0)
HCT: 42.6 % (ref 39.0–52.0)
Hemoglobin: 14.5 g/dL (ref 13.0–17.0)
Lymphocytes Relative: 12.9 % (ref 12.0–46.0)
Lymphs Abs: 1.6 10*3/uL (ref 0.7–4.0)
MCHC: 33.9 g/dL (ref 30.0–36.0)
MCV: 85.6 fl (ref 78.0–100.0)
Monocytes Absolute: 1.2 10*3/uL — ABNORMAL HIGH (ref 0.1–1.0)
Monocytes Relative: 9.4 % (ref 3.0–12.0)
Neutro Abs: 9.4 10*3/uL — ABNORMAL HIGH (ref 1.4–7.7)
Neutrophils Relative %: 76.5 % (ref 43.0–77.0)
Platelets: 222 10*3/uL (ref 150.0–400.0)
RBC: 4.98 Mil/uL (ref 4.22–5.81)
RDW: 14 % (ref 11.5–15.5)
WBC: 12.3 10*3/uL — ABNORMAL HIGH (ref 4.0–10.5)

## 2022-05-24 LAB — COMPREHENSIVE METABOLIC PANEL
ALT: 14 U/L (ref 0–53)
AST: 16 U/L (ref 0–37)
Albumin: 4.2 g/dL (ref 3.5–5.2)
Alkaline Phosphatase: 89 U/L (ref 39–117)
BUN: 21 mg/dL (ref 6–23)
CO2: 27 mEq/L (ref 19–32)
Calcium: 9.8 mg/dL (ref 8.4–10.5)
Chloride: 103 mEq/L (ref 96–112)
Creatinine, Ser: 1.26 mg/dL (ref 0.40–1.50)
GFR: 52.13 mL/min — ABNORMAL LOW (ref >60.00)
Glucose, Bld: 155 mg/dL — ABNORMAL HIGH (ref 70–99)
Potassium: 3.5 mEq/L (ref 3.5–5.1)
Sodium: 140 mEq/L (ref 135–145)
Total Bilirubin: 0.8 mg/dL (ref 0.2–1.2)
Total Protein: 6.9 g/dL (ref 6.0–8.3)

## 2022-05-24 LAB — LIPID PANEL
Cholesterol: 95 mg/dL (ref 0–200)
HDL: 30.8 mg/dL — ABNORMAL LOW (ref >39.00)
LDL Cholesterol: 41 mg/dL (ref 0–99)
NonHDL: 64.34
Total CHOL/HDL Ratio: 3
Triglycerides: 115 mg/dL (ref 0.0–149.0)
VLDL: 23 mg/dL (ref 0.0–40.0)

## 2022-05-24 LAB — HEMOGLOBIN A1C: Hgb A1c MFr Bld: 7.9 % — ABNORMAL HIGH (ref 4.6–6.5)

## 2022-05-24 LAB — CK: Total CK: 39 U/L (ref 7–232)

## 2022-05-24 NOTE — Assessment & Plan Note (Signed)
Stable

## 2022-05-24 NOTE — Assessment & Plan Note (Addendum)
Unsteady on feet when standing up, poor balance - - "shaky legs" w/first 5-10 steps - discussed exercises Declined PT Try TENS unit

## 2022-05-24 NOTE — Progress Notes (Signed)
Subjective:  Patient ID: Jeff Burgess, male    DOB: [birth year]  Age: 87 y.o. MRN: [id number]  CC: No chief complaint on file.   HPI Jeff Burgess presents for CAD, spinal stenosis, bladder bx is pending on Wed  Outpatient Medications Prior to Visit  Medication Sig Dispense Refill   amLODipine (NORVASC) 5 MG tablet TAKE 1 TABLET BY MOUTH  DAILY (Patient taking differently: Take 5 mg by mouth daily.) 90 tablet 3   aspirin 81 MG tablet Take 2 tablets (162 mg total) by mouth daily. (Patient taking differently: Take 81 mg by mouth daily.) 100 tablet 3   cholecalciferol (VITAMIN D) 25 MCG (1000 UT) tablet Take 1 tablet (1,000 Units total) by mouth every morning. Follow-up due in April must see provider for refills (Patient taking differently: Take 1,000 Units by mouth every morning.) 90 tablet 0   Cinnamon 500 MG capsule Take 200-500 mg by mouth 3 (three) times a week.     HYDROcodone-acetaminophen (NORCO/VICODIN) 5-325 MG tablet Take 1 tablet by mouth every 6 (six) hours as needed for moderate pain or severe pain.     isosorbide mononitrate (IMDUR) 30 MG 24 hr tablet Take 1 tablet (30 mg total) by mouth daily. 90 tablet 3   metFORMIN (GLUCOPHAGE-XR) 500 MG 24 hr tablet TAKE 1 TABLET BY MOUTH  TWICE DAILY (Patient taking differently: Take 500 mg by mouth 2 (two) times daily with a meal.) 200 tablet 2   telmisartan-hydrochlorothiazide (MICARDIS HCT) 80-25 MG tablet Take 1 tablet by mouth daily. 90 tablet 3   TYLENOL 500 MG tablet Take 500-1,000 mg by mouth every 8 (eight) hours as needed (for pain).     ZYPITAMAG 4 MG TABS 1 PO QD 90 tablet 3   No facility-administered medications prior to visit.    ROS: Review of Systems  Constitutional:  Negative for appetite change, fatigue and unexpected weight change.  HENT:  Negative for congestion, nosebleeds, sneezing, sore throat and trouble swallowing.   Eyes:  Negative for itching and visual disturbance.  Respiratory:  Negative for cough.    Cardiovascular:  Negative for chest pain, palpitations and leg swelling.  Gastrointestinal:  Negative for abdominal distention, blood in stool, diarrhea and nausea.  Genitourinary:  Negative for frequency and hematuria.  Musculoskeletal:  Positive for gait problem. Negative for back pain, joint swelling and neck pain.  Skin:  Negative for rash.  Neurological:  Negative for dizziness, tremors, speech difficulty and weakness.  Psychiatric/Behavioral:  Negative for agitation, dysphoric mood and sleep disturbance. The patient is not nervous/anxious.     Objective:  BP 128/64 (BP Location: Left Arm, Patient Position: Sitting, Cuff Size: Normal)   Pulse 91   Temp 98 F (36.7 C) (Oral)   Ht 1' (0.305 m)   Wt 184 lb (83.5 kg)   SpO2 99%   BMI 898.38 kg/m   BP Readings from Last 3 Encounters:  05/24/22 128/64  03/11/22 136/67  02/18/22 130/70    Wt Readings from Last 3 Encounters:  05/24/22 184 lb (83.5 kg)  05/20/22 185 lb (83.9 kg)  03/11/22 189 lb 6.4 oz (85.9 kg)    Physical Exam Constitutional:      General: He is not in acute distress.    Appearance: He is well-developed. He is obese.     Comments: NAD  Eyes:     Conjunctiva/sclera: Conjunctivae normal.     Pupils: Pupils are equal, round, and reactive to light.  Neck:  Thyroid: No thyromegaly.     Vascular: No JVD.  Cardiovascular:     Rate and Rhythm: Normal rate and regular rhythm.     Heart sounds: Normal heart sounds. No murmur heard.    No friction rub. No gallop.  Pulmonary:     Effort: Pulmonary effort is normal. No respiratory distress.     Breath sounds: Normal breath sounds. No wheezing or rales.  Chest:     Chest wall: No tenderness.  Abdominal:     General: Bowel sounds are normal. There is no distension.     Palpations: Abdomen is soft. There is no mass.     Tenderness: There is no abdominal tenderness. There is no guarding or rebound.  Musculoskeletal:        General: No tenderness. Normal  range of motion.     Cervical back: Normal range of motion.  Lymphadenopathy:     Cervical: No cervical adenopathy.  Skin:    General: Skin is warm and dry.     Findings: No rash.  Neurological:     Mental Status: He is alert and oriented to person, place, and time.     Cranial Nerves: No cranial nerve deficit.     Motor: No abnormal muscle tone.     Coordination: Coordination normal.     Gait: Gait normal.     Deep Tendon Reflexes: Reflexes are normal and symmetric.  Psychiatric:        Behavior: Behavior normal.        Thought Content: Thought content normal.        Judgment: Judgment normal.   Ataxic gait  Lab Results  Component Value Date   WBC 9.2 02/03/2022   HGB 10.6 (L) 02/03/2022   HCT 31.7 (L) 02/03/2022   PLT 196 02/03/2022   GLUCOSE 126 (H) 02/03/2022   CHOL 83 02/03/2022   TRIG 89 02/03/2022   HDL 26 (L) 02/03/2022   LDLDIRECT 78.4 11/02/2011   LDLCALC 39 02/03/2022   ALT 17 01/14/2022   AST 23 01/14/2022   NA 136 02/03/2022   K 3.3 (L) 02/03/2022   CL 107 02/03/2022   CREATININE 1.28 (H) 02/03/2022   BUN 14 02/03/2022   CO2 22 02/03/2022   TSH 1.51 07/23/2021   PSA 2.67 01/22/2021   INR 1.1 01/14/2022   HGBA1C 6.9 (H) 02/02/2022    VAS [place] ABI WITH/WO TBI  Result Date: 03/11/2022  LOWER EXTREMITY DOPPLER STUDY Patient Name:  GOBLE ESTELA  Date of Exam:   03/11/2022 Medical Rec #: [id number]         Accession #:    [id number] Date of Birth: 03/03/1935          Patient Gender: M Patient Age:   41 years Exam Location:  [facility] Procedure:      VAS [place] ABI WITH/WO TBI Referring Phys: Cristal Deer DICKSON --------------------------------------------------------------------------------  Indications: Peripheral artery disease. High Risk         Hypertension, hyperlipidemia, Diabetes, past history of Factors:          smoking.  Comparison Study: 09/28/2017 ABI/TBI- right=1.12/0.42, left=0.95/0.30 Performing Technologist: Gertie Fey MHA,  RVT, RDCS, RDMS  Examination Guidelines: A complete evaluation includes at minimum, Doppler waveform signals and systolic blood pressure reading at the level of bilateral brachial, anterior tibial, and posterior tibial arteries, when vessel segments are accessible. Bilateral testing is considered an integral part of a complete examination. Photoelectric Plethysmograph (PPG) waveforms and toe systolic pressure readings are  included as required and additional duplex testing as needed. Limited examinations for reoccurring indications may be performed as noted.  ABI Findings: +---------+------------------+-----+--------+--------+ Right    Rt Pressure (mmHg)IndexWaveformComment  +---------+------------------+-----+--------+--------+ Brachial 137                                     +---------+------------------+-----+--------+--------+ PTA      127               0.90 biphasic         +---------+------------------+-----+--------+--------+ DP       159               1.13 biphasic         +---------+------------------+-----+--------+--------+ Great Toe76                0.54                  +---------+------------------+-----+--------+--------+ +---------+------------------+-----+----------+-------+ Left     Lt Pressure (mmHg)IndexWaveform  Comment +---------+------------------+-----+----------+-------+ Brachial 141                                      +---------+------------------+-----+----------+-------+ PTA      124               0.88 monophasic        +---------+------------------+-----+----------+-------+ DP       139               0.99 triphasic         +---------+------------------+-----+----------+-------+ Great Toe51                0.36                   +---------+------------------+-----+----------+-------+ +-------+-----------+-----------+------------+------------+ ABI/TBIToday's ABIToday's TBIPrevious ABIPrevious TBI  +-------+-----------+-----------+------------+------------+ Right  1.13       0.54       1.12        0.42         +-------+-----------+-----------+------------+------------+ Left   0.99       0.36       0.95        0.30         +-------+-----------+-----------+------------+------------+  Bilateral ABIs and TBIs appear essentially unchanged compared to prior study on 09/28/2017.  Summary: Right: Resting right ankle-brachial index is within normal range. The right toe-brachial index is abnormal. Left: Resting left ankle-brachial index is within normal range. The left toe-brachial index is abnormal. *See table(s) above for measurements and observations.  Electronically signed by Waverly Ferrari MD on 03/11/2022 at 3:16:04 PM.    Final     Assessment & Plan:   Problem List Items Addressed This Visit     Dyslipidemia    Livalo 1/2 tablet every other day      LOW BACK PAIN    Start silver sneakers, gym      DM (diabetes mellitus), type 2 with renal complications (HCC)    Chronic  On Metformin      Bladder cancer (HCC) - Primary     Bladder bx is pending on Wed - Dr Alvester Morin      Gait disorder    Unsteady on feet when standing up, poor balance - - "shaky legs" w/first 5-10 steps - discussed exercises Declined PT Try TENS unit      Ataxia  Stable         No orders of the defined types were placed in this encounter.     Follow-up: Return in about 3 months (around 08/24/2022) for a follow-up visit.  Sonda Primes, MD

## 2022-05-24 NOTE — Assessment & Plan Note (Addendum)
  Bladder bx is pending on Wed - Dr Alvester Morin

## 2022-05-24 NOTE — Patient Instructions (Addendum)
Try a TENS unit from [email or web address]

## 2022-05-24 NOTE — Assessment & Plan Note (Signed)
Chronic On Metformin 

## 2022-05-24 NOTE — Assessment & Plan Note (Signed)
Livalo 1/2 tablet every other day

## 2022-05-24 NOTE — Assessment & Plan Note (Signed)
Start silver sneakers, gym

## 2022-05-25 MED ORDER — REPAGLINIDE 1 MG PO TABS: 1.0000 mg | ORAL_TABLET | Freq: Three times a day (TID) | ORAL | 11 refills | Status: DC

## 2022-05-25 NOTE — Progress Notes (Signed)
COVID Vaccine received:  []  No [x]  Yes Date of any COVID positive Test in last 90 days:  PCP - Jacinta Shoe MD Cardiologist - Christopher McAlhanyMD  Chest x-ray - 02/01/22 EPIC EKG -  02/03/22 EPIC Stress Test - ETT- 08/09/14 EPIC ECHO - 02/02/22 EPIC Cardiac Cath -   Bowel Prep - []  No  []   Yes ______  Pacemaker / ICD device []  No []  Yes   Spinal Cord Stimulator:[]  No []  Yes       History of Sleep Apnea? []  No []  Yes   CPAP used?- []  No []  Yes    Does the patient monitor blood sugar?          []  No []  Yes  []  N/A  Patient has: []  NO Hx DM   []  Pre-DM                 []  DM1  [x]   DM2 Does patient have a [employer] or Dexacom? []  No []  Yes   Fasting Blood Sugar Ranges-  Checks Blood Sugar _____ times a day HgbA1c 7.9 05/24/22 EPIC   GLP1 agonist / usual dose -  GLP1 instructions:  SGLT-2 inhibitors / usual dose -  SGLT-2 instructions:   Blood Thinner / Instructions: Aspirin Instructions:  Comments:   Activity level: Patient is able / unable to climb a flight of stairs without difficulty; []  No CP  []  No SOB, but would have ___   Patient can / can not perform ADLs without assistance.   Anesthesia review:   Patient denies shortness of breath, fever, cough and chest pain at PAT appointment.  Patient verbalized understanding and agreement to the Pre-Surgical Instructions that were given to them at this PAT appointment. Patient was also educated of the need to review these PAT instructions again prior to his/her surgery.I reviewed the appropriate phone numbers to call if they have any and questions or concerns.

## 2022-05-25 NOTE — Addendum Note (Signed)
Addended by: Tresa Garter on: 05/25/2022 11:57 PM   Modules accepted: Orders

## 2022-05-25 NOTE — Patient Instructions (Signed)
SURGICAL WAITING ROOM VISITATION  Patients having surgery or a procedure may have no more than 2 support people in the waiting area - these visitors may rotate.    Children under the age of 14 must have an adult with them who is not the patient.  Due to an increase in RSV and influenza rates and associated hospitalizations, children ages 80 and under may not visit patients in [facility] hospitals.  If the patient needs to stay at the hospital during part of their recovery, the visitor guidelines for inpatient rooms apply. Pre-op nurse will coordinate an appropriate time for 1 support person to accompany patient in pre-op.  This support person may not rotate.    Please refer to the [facility] website for the visitor guidelines for Inpatients (after your surgery is over and you are in a regular room).    Your procedure is scheduled on: 05/28/22   Report to [facility] Main Entrance    Report to admitting at 12:45 pm   Call this number if you have problems the morning of surgery [phone]   Do not eat food or liquids :After Midnight.           If you have questions, please contact your surgeon's office.   FOLLOW BOWEL PREP AND ANY ADDITIONAL PRE OP INSTRUCTIONS YOU RECEIVED FROM YOUR SURGEON'S OFFICE!!!     Oral Hygiene is also important to reduce your risk of infection.                                    Remember - BRUSH YOUR TEETH THE MORNING OF SURGERY WITH YOUR REGULAR TOOTHPASTE  DENTURES WILL BE REMOVED PRIOR TO SURGERY PLEASE DO NOT APPLY "Poly grip" OR ADHESIVES!!!      Take these medicines the morning of surgery with A SIP OF WATER: Amlodipine, Isosorbide mononitrate, Zypitamag  How to Manage Your Diabetes Before and After Surgery  Why is it important to control my blood sugar before and after surgery? Improving blood sugar levels before and after surgery helps healing and can limit problems. A way of improving blood sugar control is eating a healthy diet  by:  Eating less sugar and carbohydrates  Increasing activity/exercise  Talking with your doctor about reaching your blood sugar goals High blood sugars (greater than 180 mg/dL) can raise your risk of infections and slow your recovery, so you will need to focus on controlling your diabetes during the weeks before surgery. Make sure that the doctor who takes care of your diabetes knows about your planned surgery including the date and location.  How do I manage my blood sugar before surgery? Check your blood sugar at least 4 times a day, starting 2 days before surgery, to make sure that the level is not too high or low. Check your blood sugar the morning of your surgery when you wake up and every 2 hours until you get to the Short Stay unit. If your blood sugar is less than 70 mg/dL, you will need to treat for low blood sugar: Do not take insulin. Treat a low blood sugar (less than 70 mg/dL) with  cup of clear juice (cranberry or apple), 4 glucose tablets, OR glucose gel. Recheck blood sugar in 15 minutes after treatment (to make sure it is greater than 70 mg/dL). If your blood sugar is not greater than 70 mg/dL on recheck, call [phone] for further instructions.  Report your blood sugar to the short stay nurse when you get to Short Stay.  If you are admitted to the hospital after surgery: Your blood sugar will be checked by the staff and you will probably be given insulin after surgery (instead of oral diabetes medicines) to make sure you have good blood sugar levels. The goal for blood sugar control after surgery is 80-180 mg/dL.   WHAT DO I DO ABOUT MY DIABETES MEDICATION?  Do not take oral diabetes medicines (pills) the morning of surgery.  THE NIGHT BEFORE SURGERY, take  Metformin as prescribed.      THE MORNING OF SURGERY, Do not take Metformin  Reviewed and Endorsed by [facility] Patient Education Committee, August 2015            You may not have any metal on your body  including hair pins, jewelry, and body piercing             Do not wear make-up, lotions, powders, perfumes/cologne, or deodorant  Do not wear nail polish including gel and S&S, artificial/acrylic nails, or any other type of covering on natural nails including finger and toenails. If you have artificial nails, gel coating, etc. that needs to be removed by a nail salon please have this removed prior to surgery or surgery may need to be canceled/ delayed if the surgeon/ anesthesia feels like they are unable to be safely monitored.   Do not shave  48 hours prior to surgery.               Men may shave face and neck.   Do not bring valuables to the hospital. [place] IS NOT             RESPONSIBLE   FOR VALUABLES.   Contacts, glasses, dentures or bridgework may not be worn into surgery.   Bring small overnight bag day of surgery.   DO NOT BRING YOUR HOME MEDICATIONS TO THE HOSPITAL. PHARMACY WILL DISPENSE MEDICATIONS LISTED ON YOUR MEDICATION LIST TO YOU DURING YOUR ADMISSION IN THE HOSPITAL!    Patients discharged on the day of surgery will not be allowed to drive home.  Someone NEEDS to stay with you for the first 24 hours after anesthesia.   Special Instructions: Bring a copy of your healthcare power of attorney and living will documents the day of surgery if you haven't scanned them before.              Please read over the following fact sheets you were given: IF YOU HAVE QUESTIONS ABOUT YOUR PRE-OP INSTRUCTIONS PLEASE CALL [phone]   If you received a COVID test during your pre-op visit  it is requested that you wear a mask when out in public, stay away from anyone that may not be feeling well and notify your surgeon if you develop symptoms. If you test positive for Covid or have been in contact with anyone that has tested positive in the last 10 days please notify you surgeon.    [facility] - Preparing for Surgery Before surgery, you can play an important role.   Because skin is not sterile, your skin needs to be as free of germs as possible.  You can reduce the number of germs on your skin by washing with CHG (chlorahexidine gluconate) soap before surgery.  CHG is an antiseptic cleaner which kills germs and bonds with the skin to continue killing germs even after washing. Please DO NOT use if you have  an allergy to CHG or antibacterial soaps.  If your skin becomes reddened/irritated stop using the CHG and inform your nurse when you arrive at Short Stay. Do not shave (including legs and underarms) for at least 48 hours prior to the first CHG shower.  You may shave your face/neck.  Please follow these instructions carefully:  1.  Shower with CHG Soap the night before surgery and the  morning of surgery.  2.  If you choose to wash your hair, wash your hair first as usual with your normal  shampoo.  3.  After you shampoo, rinse your hair and body thoroughly to remove the shampoo.                             4.  Use CHG as you would any other liquid soap.  You can apply chg directly to the skin and wash.  Gently with a scrungie or clean washcloth.  5.  Apply the CHG Soap to your body ONLY FROM THE NECK DOWN.   Do   not use on face/ open                           Wound or open sores. Avoid contact with eyes, ears mouth and   genitals (private parts).                       Wash face,  Genitals (private parts) with your normal soap.             6.  Wash thoroughly, paying special attention to the area where your    surgery  will be performed.  7.  Thoroughly rinse your body with warm water from the neck down.  8.  DO NOT shower/wash with your normal soap after using and rinsing off the CHG Soap.                9.  Pat yourself dry with a clean towel.            10.  Wear clean pajamas.            11.  Place clean sheets on your bed the night of your first shower and do not  sleep with pets. Day of Surgery : Do not apply any lotions/deodorants the morning of  surgery.  Please wear clean clothes to the hospital/surgery center.  FAILURE TO FOLLOW THESE INSTRUCTIONS MAY RESULT IN THE CANCELLATION OF YOUR SURGERY  PATIENT SIGNATURE_________________________________  NURSE SIGNATURE__________________________________  ________________________________________________________________________

## 2022-05-26 ENCOUNTER — Other Ambulatory Visit: Payer: Self-pay | Admitting: Urology

## 2022-05-26 ENCOUNTER — Other Ambulatory Visit: Payer: Self-pay

## 2022-05-26 ENCOUNTER — Encounter (HOSPITAL_COMMUNITY): Payer: Self-pay

## 2022-05-26 ENCOUNTER — Encounter (HOSPITAL_COMMUNITY)
Admission: RE | Admit: 2022-05-26 | Discharge: 2022-05-26 | Disposition: A | Discharge status: Home / Self Care | Payer: Medicare Other | Source: Ambulatory Visit | Attending: Urology | Admitting: Urology

## 2022-05-26 VITALS — BP 133/75 | HR 96 | Temp 98.8°F | Resp 18 | Wt 182.0 lb

## 2022-05-26 DIAGNOSIS — N183 Chronic kidney disease, stage 3 unspecified: Secondary | ICD-10-CM | POA: Insufficient documentation

## 2022-05-26 DIAGNOSIS — Z951 Presence of aortocoronary bypass graft: Secondary | ICD-10-CM | POA: Diagnosis not present

## 2022-05-26 DIAGNOSIS — Z8551 Personal history of malignant neoplasm of bladder: Secondary | ICD-10-CM | POA: Insufficient documentation

## 2022-05-26 DIAGNOSIS — I129 Hypertensive chronic kidney disease with stage 1 through stage 4 chronic kidney disease, or unspecified chronic kidney disease: Secondary | ICD-10-CM | POA: Diagnosis not present

## 2022-05-26 DIAGNOSIS — E119 Type 2 diabetes mellitus without complications: Secondary | ICD-10-CM

## 2022-05-26 DIAGNOSIS — E1122 Type 2 diabetes mellitus with diabetic chronic kidney disease: Secondary | ICD-10-CM | POA: Insufficient documentation

## 2022-05-26 DIAGNOSIS — I2510 Atherosclerotic heart disease of native coronary artery without angina pectoris: Secondary | ICD-10-CM | POA: Insufficient documentation

## 2022-05-26 DIAGNOSIS — C679 Malignant neoplasm of bladder, unspecified: Secondary | ICD-10-CM | POA: Insufficient documentation

## 2022-05-26 DIAGNOSIS — Z87891 Personal history of nicotine dependence: Secondary | ICD-10-CM | POA: Insufficient documentation

## 2022-05-26 DIAGNOSIS — Z01812 Encounter for preprocedural laboratory examination: Secondary | ICD-10-CM | POA: Diagnosis not present

## 2022-05-26 HISTORY — DX: Personal history of urinary calculi: Z87.442

## 2022-05-26 HISTORY — DX: Cardiac murmur, unspecified: R01.1

## 2022-05-26 LAB — GLUCOSE, CAPILLARY: Glucose-Capillary: 202 mg/dL — ABNORMAL HIGH (ref 70–99)

## 2022-05-26 NOTE — Progress Notes (Addendum)
COVID Vaccine received:  []  No [x]  Yes Date of any COVID positive Test in last 90 days:   PCP - Jacinta Shoe MD Cardiologist - Christopher McAlhanyMD   Chest x-ray - 02/01/22 EPIC EKG -  02/03/22 EPIC Stress Test - ETT- 08/09/14 EPIC ECHO - 02/02/22 EPIC Cardiac Cath -    Bowel Prep - []  No  []   Yes ______   Pacemaker / ICD device []  No []  Yes   Spinal Cord Stimulator:[]  No []  Yes       History of Sleep Apnea? []  No []  Yes   CPAP used?- []  No []  Yes     Does the patient monitor blood sugar?          []  No []  Yes  []  N/A   Patient has: []  NO Hx DM   []  Pre-DM                 []  DM1  [x]   DM2 Does patient have a [employer] or Dexacom? []  No []  Yes   Fasting Blood Sugar Ranges-  Checks Blood Sugar _____ times a day HgbA1c 7.9 05/24/22 EPIC     GLP1 agonist / usual dose -  GLP1 instructions:  SGLT-2 inhibitors / usual dose -  SGLT-2 instructions:    Blood Thinner / Instructions: Aspirin Instructions:   Comments:    Activity level: Patient is able / unable to climb a flight of stairs without difficulty; []  No CP  []  No SOB, but would have ___   Patient can / can not perform ADLs without assistance.    Anesthesia review:    Patient denies shortness of breath, fever, cough and chest pain at PAT appointment.   Patient verbalized understanding and agreement to the Pre-Surgical Instructions that were given to them at this PAT appointment. Patient was also educated of the need to review these PAT instructions again prior to his/her surgery.I reviewed the appropriate phone numbers to call if they have any and questions or concerns

## 2022-05-27 NOTE — Anesthesia Preprocedure Evaluation (Addendum)
Anesthesia Evaluation  Patient identified by MRN, date of birth, ID band Patient awake    Reviewed: Allergy & Precautions, NPO status , Patient's Chart, lab work & pertinent test results  Airway Mallampati: II  TM Distance: >3 FB Neck ROM: Full    Dental  (+) Dental Advisory Given   Pulmonary former smoker   breath sounds clear to auscultation       Cardiovascular hypertension, Pt. on medications + CAD, + Past MI and + CABG  + dysrhythmias + Valvular Problems/Murmurs AS  Rhythm:Regular Rate:Normal  Echo 02/02/22:   IMPRESSIONS     1. Left ventricular ejection fraction, by estimation, is 55%. The left  ventricle has low normal function. There is mild left ventricular  hypertrophy. Left ventricular diastolic parameters are indeterminate.   2. Right ventricular systolic function is normal. The right ventricular  size is normal. There is normal pulmonary artery systolic pressure.   3. The mitral valve is normal in structure. Mild mitral valve  regurgitation.   4. AV is thickened, calcified with restricted motion. Peak and mean  gradients through the valve are 45 and 23 mm Hg respectively AVA (VTI) is  1.2 cm2 DVI is 0.35 consistent with moderate AS. [name] The aortic valve is  tricuspid. Aortic valve regurgitation is   mild.   5. The inferior vena cava is normal in size with greater than 50%  respiratory variability, suggesting right atrial pressure of 3 mmHg.     Neuro/Psych negative neurological ROS     GI/Hepatic negative GI ROS, Neg liver ROS,,,  Endo/Other  diabetes, Type 2    Renal/GU CRFRenal disease     Musculoskeletal  (+) Arthritis ,    Abdominal   Peds  Hematology negative hematology ROS (+)   Anesthesia Other Findings   Reproductive/Obstetrics                              Anesthesia Physical Anesthesia Plan  ASA: 3  Anesthesia Plan: General   Post-op Pain Management:  Tylenol PO (pre-op)*   Induction: Intravenous  PONV Risk Score and Plan: 2 and Dexamethasone, Ondansetron and Treatment may vary due to age or medical condition  Airway Management Planned: Oral ETT  Additional Equipment:   Intra-op Plan:   Post-operative Plan: Extubation in OR  Informed Consent: I have reviewed the patients History and Physical, chart, labs and discussed the procedure including the risks, benefits and alternatives for the proposed anesthesia with the patient or authorized representative who has indicated his/her understanding and acceptance.     Dental advisory given  Plan Discussed with: CRNA  Anesthesia Plan Comments:          Anesthesia Quick Evaluation

## 2022-05-27 NOTE — Progress Notes (Signed)
Case: [id number] Date/Time: 05/28/22 1445   Procedure: TRANSURETHRAL RESECTION OF BLADDER TUMOR (TURBT)  BILATERAL RETROGRADE PYELOGRAM (Bilateral) - 1 HR FOR CASE   Anesthesia type: General   Pre-op diagnosis: BLADDER TUMOR   [facility] ROOM 03 / [facility]   Surgeons: Crista Elliot, MD       DISCUSSION: Jeff Burgess is an 87 year old male who is a former smoker who presents to PAT prior to surgery listed above.  Past medical history significant for bladder cancer diagnosed in 2017 and underwent TURBT at that time. Developed gross hematuria in Dec 2023 and underwent repeat TURBT on 01/14/22.  Other past medical history significant for coronary artery disease status post CABG in 1998, moderate AS, CKD stage III, diabetes, hypertension, prior lumbar surgery.  Patient had an NSTEMI 02/01/2022 and was admitted for a couple of days.  When he was started on heparin drip he developed gross hematuria again.  Ultimately medical management was recommended by cardiology rather than a cath due to suspicion this was demand ischemia. Echo showed normal EF with moderate AS. Patient is now scheduled for repeat TURBT with Urology.  Patient followed up with cardiology as an outpatient on 02/18/2022.  Patient noted fatigue and claudication but no chest pain, shortness of breath. Patient saw his PCP on 5/13 and was noted to be stable.  No prior anesthesia complications  Discussed with Dr. Tacy Dura due to recent NSTEMI. Ok to proceed without formal cardiac clearance.  VS: BP 133/75   Pulse 96   Temp 37.1 C (Oral)   Resp 18   Wt 82.6 kg   SpO2 95%   BMI 888.61 kg/m   PROVIDERS: Plotnikov, Georgina Quint, MD Cardiology: Dr. Carlos Levering  LABS: Labs reviewed: Acceptable for surgery. (all labs ordered are listed, but only abnormal results are displayed)  Labs Reviewed  GLUCOSE, CAPILLARY - Abnormal; Notable for the following components:      Result Value   Glucose-Capillary 202 (*)    All other  components within normal limits     IMAGES: CXR 02/01/22:  IMPRESSION: Chronic calcified pleural plaques RIGHT hemithorax; these are BILATERAL on prior CT, favor asbestos exposure.   No acute abnormalities.    EKG 02/01/22:  Sinus rhythm with PVCs LAD RBBB Appears similar to prior   CV:  Echo 02/02/22:  IMPRESSIONS     1. Left ventricular ejection fraction, by estimation, is 55%. The left  ventricle has low normal function. There is mild left ventricular  hypertrophy. Left ventricular diastolic parameters are indeterminate.   2. Right ventricular systolic function is normal. The right ventricular  size is normal. There is normal pulmonary artery systolic pressure.   3. The mitral valve is normal in structure. Mild mitral valve  regurgitation.   4. AV is thickened, calcified with restricted motion. Peak and mean  gradients through the valve are 45 and 23 mm Hg respectively AVA (VTI) is  1.2 cm2 DVI is 0.35 consistent with moderate AS. [name] The aortic valve is  tricuspid. Aortic valve regurgitation is   mild.   5. The inferior vena cava is normal in size with greater than 50%  respiratory variability, suggesting right atrial pressure of 3 mmHg.   Carotid US 03/24/2016:  Shadowing plaque, bilaterally.  1- 39% bilateral ICA stenosis.  Patent vertebral arteries with antegrade flow.  Normal subclavian arteries, bilaterally.  Past Medical History:  Diagnosis Date   Benign localized prostatic hyperplasia with lower urinary tract symptoms (LUTS)    Bladder  cancer [facility]) dx 05/ 2017--  urologist- dr Sherryl Barters   TCC , Ta of bladder  s/p TURBT 06-02-2015   CAD (coronary artery disease)    no cardiologist--  followed by pcp,  dr plontnikov   Chronic low back pain with sciatica    CKD (chronic kidney disease), stage III (HCC)    Diabetes mellitus, type 2 [facility])    ED (erectile dysfunction)    Full dentures    Heart murmur    History of ETT    08-09-2014   fair exercise  tolerance, no chest pain, normal BP response, no ST changes (baseline RBBB);  negative adequate ETT   History of kidney stones    Hyperlipidemia    Hypertension    Hypogonadism male    OA (osteoarthritis)    Right bundle branch block (RBBB)    S/P CABG x 1    1998   Sigmoid diverticulosis    Ventral hernia    midline   Wears glasses     Past Surgical History:  Procedure Laterality Date   COLONOSCOPY WITH PROPOFOL  02-01-2012   CORONARY ARTERY BYPASS GRAFT  1998  at [facility]   x1  vessel    CYSTOSCOPY WITH BIOPSY N/A 10/08/2015   Procedure: CYSTOSCOPY WITH BLADDER BIOPSY;  Surgeon: Hildred Laser, MD;  Location: [facility];  Service: Urology;  Laterality: N/A;   CYSTOSCOPY WITH FULGERATION N/A 10/08/2015   Procedure: CYSTOSCOPY WITH FULGERATION;  Surgeon: Hildred Laser, MD;  Location: [facility];  Service: Urology;  Laterality: N/A;   CYSTOSCOPY WITH FULGERATION N/A 01/14/2022   Procedure: CYSTOSCOPY WITH FULGERATION;  Surgeon: Crista Elliot, MD;  [facility];  Service: Urology;  Laterality: N/A;   TONSILLECTOMY  age 45   TRANSURETHRAL RESECTION OF BLADDER TUMOR WITH GYRUS (TURBT-GYRUS) N/A 06/02/2015   Procedure: TRANSURETHRAL RESECTION OF BLADDER TUMOR WITH GYRUS (TURBT-GYRUS);  Surgeon: Hildred Laser, MD;  Location: [facility];  Service: Urology;  Laterality: N/A;    MEDICATIONS:  amLODipine (NORVASC) 5 MG tablet   aspirin 81 MG tablet   cholecalciferol (VITAMIN D) 25 MCG (1000 UT) tablet   Cinnamon 500 MG capsule   HYDROcodone-acetaminophen (NORCO/VICODIN) 5-325 MG tablet   isosorbide mononitrate (IMDUR) 30 MG 24 hr tablet   metFORMIN (GLUCOPHAGE-XR) 500 MG 24 hr tablet   repaglinide (PRANDIN) 1 MG tablet   telmisartan-hydrochlorothiazide (MICARDIS HCT) 80-25 MG tablet   TYLENOL 500 MG tablet   ZYPITAMAG 4 MG TABS   No current facility-administered medications for this encounter.   Marcille Blanco [facility] Short Stay/Anesthesiology [facility] Phone [phone] 05/27/2022 10:06 AM

## 2022-05-28 ENCOUNTER — Ambulatory Visit (HOSPITAL_COMMUNITY)
Admission: RE | Admit: 2022-05-28 | Discharge: 2022-05-28 | Disposition: A | Discharge status: Home / Self Care | Payer: Medicare Other | Attending: Urology | Admitting: Urology

## 2022-05-28 ENCOUNTER — Encounter (HOSPITAL_COMMUNITY): Payer: Self-pay | Admitting: Urology

## 2022-05-28 ENCOUNTER — Encounter (HOSPITAL_COMMUNITY)
Admission: RE | Disposition: A | Discharge status: Home / Self Care | Payer: Self-pay | Source: Home / Self Care | Attending: Urology

## 2022-05-28 ENCOUNTER — Ambulatory Visit (HOSPITAL_COMMUNITY): Payer: Medicare Other | Admitting: Medical

## 2022-05-28 ENCOUNTER — Ambulatory Visit (HOSPITAL_COMMUNITY): Payer: Medicare Other

## 2022-05-28 ENCOUNTER — Ambulatory Visit (HOSPITAL_BASED_OUTPATIENT_CLINIC_OR_DEPARTMENT_OTHER): Payer: Medicare Other | Admitting: Anesthesiology

## 2022-05-28 DIAGNOSIS — Z87891 Personal history of nicotine dependence: Secondary | ICD-10-CM

## 2022-05-28 DIAGNOSIS — C672 Malignant neoplasm of lateral wall of bladder: Secondary | ICD-10-CM | POA: Diagnosis not present

## 2022-05-28 DIAGNOSIS — I120 Hypertensive chronic kidney disease with stage 5 chronic kidney disease or end stage renal disease: Secondary | ICD-10-CM

## 2022-05-28 DIAGNOSIS — N189 Chronic kidney disease, unspecified: Secondary | ICD-10-CM | POA: Diagnosis not present

## 2022-05-28 DIAGNOSIS — Z951 Presence of aortocoronary bypass graft: Secondary | ICD-10-CM | POA: Diagnosis not present

## 2022-05-28 DIAGNOSIS — N3289 Other specified disorders of bladder: Secondary | ICD-10-CM | POA: Diagnosis not present

## 2022-05-28 DIAGNOSIS — D494 Neoplasm of unspecified behavior of bladder: Secondary | ICD-10-CM | POA: Insufficient documentation

## 2022-05-28 DIAGNOSIS — I2510 Atherosclerotic heart disease of native coronary artery without angina pectoris: Secondary | ICD-10-CM | POA: Diagnosis not present

## 2022-05-28 DIAGNOSIS — Z7984 Long term (current) use of oral hypoglycemic drugs: Secondary | ICD-10-CM | POA: Diagnosis not present

## 2022-05-28 DIAGNOSIS — I252 Old myocardial infarction: Secondary | ICD-10-CM | POA: Diagnosis not present

## 2022-05-28 DIAGNOSIS — E1122 Type 2 diabetes mellitus with diabetic chronic kidney disease: Secondary | ICD-10-CM | POA: Diagnosis not present

## 2022-05-28 DIAGNOSIS — E119 Type 2 diabetes mellitus without complications: Secondary | ICD-10-CM

## 2022-05-28 DIAGNOSIS — I129 Hypertensive chronic kidney disease with stage 1 through stage 4 chronic kidney disease, or unspecified chronic kidney disease: Secondary | ICD-10-CM | POA: Diagnosis not present

## 2022-05-28 HISTORY — PX: TRANSURETHRAL RESECTION OF BLADDER TUMOR: SHX2575

## 2022-05-28 LAB — GLUCOSE, CAPILLARY
Glucose-Capillary: 162 mg/dL — ABNORMAL HIGH (ref 70–99)
Glucose-Capillary: 140 mg/dL — ABNORMAL HIGH (ref 70–99)

## 2022-05-28 SURGERY — TURBT (TRANSURETHRAL RESECTION OF BLADDER TUMOR)
Anesthesia: General | Laterality: Bilateral

## 2022-05-28 MED ORDER — ORAL CARE MOUTH RINSE: 15.0000 mL | Freq: Once | OROMUCOSAL | Status: AC

## 2022-05-28 MED ORDER — ACETAMINOPHEN 500 MG PO TABS
1000.0000 mg | ORAL_TABLET | Freq: Once | ORAL | Status: AC
  Administered 2022-05-28: 1000 mg via ORAL
  Filled 2022-05-28: qty 2

## 2022-05-28 MED ORDER — DEXMEDETOMIDINE HCL IN NACL 80 MCG/20ML IV SOLN
INTRAVENOUS | Status: AC
  Filled 2022-05-28: qty 20

## 2022-05-28 MED ORDER — EPHEDRINE SULFATE-NACL 50-0.9 MG/10ML-% IV SOSY
PREFILLED_SYRINGE | INTRAVENOUS | Status: DC | PRN
  Administered 2022-05-28: 5 mg via INTRAVENOUS

## 2022-05-28 MED ORDER — DEXAMETHASONE SODIUM PHOSPHATE 10 MG/ML IJ SOLN
INTRAMUSCULAR | Status: AC
  Filled 2022-05-28: qty 1

## 2022-05-28 MED ORDER — FENTANYL CITRATE (PF) 250 MCG/5ML IJ SOLN
INTRAMUSCULAR | Status: DC | PRN
  Administered 2022-05-28 (×2): 50 ug via INTRAVENOUS

## 2022-05-28 MED ORDER — 0.9 % SODIUM CHLORIDE (POUR BTL) OPTIME
TOPICAL | Status: DC | PRN
  Administered 2022-05-28: 1000 mL

## 2022-05-28 MED ORDER — LACTATED RINGERS IV SOLN: INTRAVENOUS | Status: DC

## 2022-05-28 MED ORDER — FENTANYL CITRATE PF 50 MCG/ML IJ SOSY
25.0000 ug | PREFILLED_SYRINGE | INTRAMUSCULAR | Status: DC | PRN
  Administered 2022-05-28: 50 ug via INTRAVENOUS

## 2022-05-28 MED ORDER — AMISULPRIDE (ANTIEMETIC) 5 MG/2ML IV SOLN: 10.0000 mg | Freq: Once | INTRAVENOUS | Status: DC | PRN

## 2022-05-28 MED ORDER — SUGAMMADEX SODIUM 200 MG/2ML IV SOLN
INTRAVENOUS | Status: DC | PRN
  Administered 2022-05-28: 200 mg via INTRAVENOUS

## 2022-05-28 MED ORDER — IOHEXOL 300 MG/ML  SOLN
INTRAMUSCULAR | Status: DC | PRN
  Administered 2022-05-28: 25 mL

## 2022-05-28 MED ORDER — DEXAMETHASONE SODIUM PHOSPHATE 10 MG/ML IJ SOLN
INTRAMUSCULAR | Status: DC | PRN
  Administered 2022-05-28: 5 mg via INTRAVENOUS

## 2022-05-28 MED ORDER — ONDANSETRON HCL 4 MG/2ML IJ SOLN
INTRAMUSCULAR | Status: AC
  Filled 2022-05-28: qty 2

## 2022-05-28 MED ORDER — SODIUM CHLORIDE 0.9 % IR SOLN
Status: DC | PRN
  Administered 2022-05-28: 12000 mL via INTRAVESICAL

## 2022-05-28 MED ORDER — LIDOCAINE HCL (PF) 2 % IJ SOLN
INTRAMUSCULAR | Status: AC
  Filled 2022-05-28: qty 5

## 2022-05-28 MED ORDER — HYDROCODONE-ACETAMINOPHEN 5-325 MG PO TABS: 1.0000 | ORAL_TABLET | Freq: Four times a day (QID) | ORAL | 0 refills | Status: DC | PRN

## 2022-05-28 MED ORDER — CEFAZOLIN SODIUM-DEXTROSE 2-4 GM/100ML-% IV SOLN
2.0000 g | INTRAVENOUS | Status: AC
  Administered 2022-05-28: 2 g via INTRAVENOUS
  Filled 2022-05-28: qty 100

## 2022-05-28 MED ORDER — PROPOFOL 10 MG/ML IV BOLUS
INTRAVENOUS | Status: DC | PRN
  Administered 2022-05-28: 180 mg via INTRAVENOUS

## 2022-05-28 MED ORDER — CHLORHEXIDINE GLUCONATE 0.12 % MT SOLN
15.0000 mL | Freq: Once | OROMUCOSAL | Status: AC
  Administered 2022-05-28: 15 mL via OROMUCOSAL

## 2022-05-28 MED ORDER — ROCURONIUM BROMIDE 10 MG/ML (PF) SYRINGE
PREFILLED_SYRINGE | INTRAVENOUS | Status: AC
  Filled 2022-05-28: qty 10

## 2022-05-28 MED ORDER — EPHEDRINE 5 MG/ML INJ
INTRAVENOUS | Status: AC
  Filled 2022-05-28: qty 5

## 2022-05-28 MED ORDER — FENTANYL CITRATE PF 50 MCG/ML IJ SOSY
PREFILLED_SYRINGE | INTRAMUSCULAR | Status: AC
  Filled 2022-05-28: qty 2

## 2022-05-28 MED ORDER — FENTANYL CITRATE (PF) 100 MCG/2ML IJ SOLN
INTRAMUSCULAR | Status: AC
  Filled 2022-05-28: qty 2

## 2022-05-28 MED ORDER — ROCURONIUM BROMIDE 10 MG/ML (PF) SYRINGE
PREFILLED_SYRINGE | INTRAVENOUS | Status: DC | PRN
  Administered 2022-05-28: 40 mg via INTRAVENOUS

## 2022-05-28 MED ORDER — LIDOCAINE 2% (20 MG/ML) 5 ML SYRINGE
INTRAMUSCULAR | Status: DC | PRN
  Administered 2022-05-28: 60 mg via INTRAVENOUS

## 2022-05-28 SURGICAL SUPPLY — 20 items
BAG DRN RND TRDRP ANRFLXCHMBR (UROLOGICAL SUPPLIES)
BAG URINE DRAIN 2000ML AR STRL (UROLOGICAL SUPPLIES) IMPLANT
BAG URO CATCHER STRL LF (MISCELLANEOUS) ×1 IMPLANT
CATH FOLEY 2WAY SLVR  5CC 18FR (CATHETERS)
CATH FOLEY 2WAY SLVR 5CC 18FR (CATHETERS) IMPLANT
DRAPE FOOT SWITCH (DRAPES) ×1 IMPLANT
ELECT REM PT RETURN 15FT ADLT (MISCELLANEOUS) ×1 IMPLANT
GLOVE BIO SURGEON STRL SZ7.5 (GLOVE) ×1 IMPLANT
GOWN STRL REUS W/ TWL XL LVL3 (GOWN DISPOSABLE) ×1 IMPLANT
GOWN STRL REUS W/TWL XL LVL3 (GOWN DISPOSABLE) ×1
GUIDEWIRE STR DUAL SENSOR (WIRE) IMPLANT
KIT TURNOVER KIT A (KITS) IMPLANT
LOOP CUT BIPOLAR 24F LRG (ELECTROSURGICAL) IMPLANT
MANIFOLD NEPTUNE II (INSTRUMENTS) ×1 IMPLANT
PACK CYSTO (CUSTOM PROCEDURE TRAY) ×1 IMPLANT
PLUG CATH AND CAP STRL 200 (CATHETERS) IMPLANT
SYR TOOMEY IRRIG 70ML (MISCELLANEOUS)
SYRINGE TOOMEY IRRIG 70ML (MISCELLANEOUS) IMPLANT
TUBING CONNECTING 10 (TUBING) ×1 IMPLANT
TUBING UROLOGY SET (TUBING) ×1 IMPLANT

## 2022-05-28 NOTE — Anesthesia Postprocedure Evaluation (Signed)
Anesthesia Post Note  Patient: Jeff Burgess  Procedure(s) Performed: TRANSURETHRAL RESECTION OF BLADDER TUMOR (TURBT)  BILATERAL RETROGRADE PYELOGRAM (Bilateral)     Patient location during evaluation: PACU Anesthesia Type: General Level of consciousness: awake and alert Pain management: pain level controlled Vital Signs Assessment: post-procedure vital signs reviewed and stable Respiratory status: spontaneous breathing, nonlabored ventilation, respiratory function stable and patient connected to nasal cannula oxygen Cardiovascular status: blood pressure returned to baseline and stable Postop Assessment: no apparent nausea or vomiting Anesthetic complications: no  No notable events documented.  Last Vitals:  Vitals:   05/28/22 1645 05/28/22 1650  BP: (!) 164/69   Pulse: 73 82  Resp: 16 16  Temp:    SpO2: 98% 96%    Last Pain:  Vitals:   05/28/22 1629  TempSrc:   PainSc: Asleep                 Kennieth Rad

## 2022-05-28 NOTE — Anesthesia Procedure Notes (Signed)
Procedure Name: Intubation Date/Time: 05/28/2022 3:21 PM  Performed by: Lovie Chol, CRNAPre-anesthesia Checklist: Patient identified, Emergency Drugs available, Suction available and Patient being monitored Patient Re-evaluated:Patient Re-evaluated prior to induction Oxygen Delivery Method: Circle System Utilized Preoxygenation: Pre-oxygenation with 100% oxygen Induction Type: IV induction Ventilation: Mask ventilation without difficulty, Oral airway inserted - appropriate to patient size and Two handed mask ventilation required Laryngoscope Size: Miller and 3 Grade View: Grade I Tube type: Oral Tube size: 7.5 mm Number of attempts: 1 Airway Equipment and Method: Stylet and Oral airway Placement Confirmation: ETT inserted through vocal cords under direct vision, positive ETCO2 and breath sounds checked- equal and bilateral Secured at: 22 cm Tube secured with: Tape Dental Injury: Teeth and Oropharynx as per pre-operative assessment

## 2022-05-28 NOTE — Transfer of Care (Signed)
Immediate Anesthesia Transfer of Care Note  Patient: Jeff Burgess  Procedure(s) Performed: TRANSURETHRAL RESECTION OF BLADDER TUMOR (TURBT)  BILATERAL RETROGRADE PYELOGRAM (Bilateral)  Patient Location: PACU  Anesthesia Type:General  Level of Consciousness: awake and alert   Airway & Oxygen Therapy: Patient Spontanous Breathing and Patient connected to nasal cannula oxygen  Post-op Assessment: Report given to RN and Post -op Vital signs reviewed and stable  Post vital signs: Reviewed and stable  Last Vitals:  Vitals Value Taken Time  BP 172/105 05/28/22 1618  Temp    Pulse 76 05/28/22 1620  Resp 14 05/28/22 1620  SpO2 100 % 05/28/22 1620  Vitals shown include unvalidated device data.  Last Pain:  Vitals:   05/28/22 1253  TempSrc: Oral         Complications: No notable events documented.

## 2022-05-28 NOTE — Discharge Instructions (Signed)

## 2022-05-28 NOTE — Op Note (Signed)
Operative Note  Preoperative diagnosis:  1.  Bladder tumor  Postoperative diagnosis: 1.  Bladder tumor--medium  Procedure(s): 1.  Cystoscopy with bilateral retrograde pyelogram 2.  Transurethral resection of bladder tumor--medium  Surgeon: Modena Slater, MD  Assistants: None  Anesthesia: General  Complications: None immediate  EBL: Minimal  Specimens: 1.  Bladder tumor  Drains/Catheters: 1.  None  Intraoperative findings: 1.  Normal anterior urethra 2.  Borderline obstructing prostate 3.  Bladder mucosa with slightly raised erythema over prior TUR scar on the right.  This was resected and fulgurated.  Total area of resection about 3 cm.  Left retrograde pyelogram without any filling defect or hydronephrosis.  Right ureteral orifice little bit difficult to identify but ultimately able to and retrograde pyelogram also revealed no filling defect or hydronephrosis.  Indication: 87 year old male with history of high-grade T1 bladder cancer presents for TURBT after finding possible recurrence.  Description of procedure:  The patient was identified and consent was obtained.  The patient was taken to the operating room and placed in the supine position.  The patient was placed under general anesthesia.  Perioperative antibiotics were administered.  The patient was placed in dorsal lithotomy.  Patient was prepped and draped in a standard sterile fashion and a timeout was performed.  A 21 French rigid cystoscope was advanced into the urethra and into the bladder.  Complete cystoscopy was performed with findings noted above.  The left ureter was cannulated with an open-ended ureteral catheter and a retrograde pyelogram was performed with no abnormal findings.  I have a difficult time finding the right ureteral orifice but was ultimately able to advance a wire into the right ureter and advanced a open-ended ureteral catheter into the distal ureter.  Retrograde pyelogram was performed.  I  initially thought there were some filling defects in the distal ureter but further contrast instillation revealed these were air bubbles.  Remainder of ureter without any obvious filling defect.  No hydronephrosis.  I withdrew the scope and advanced a 25 French resectoscope with a visual obturator and placed into the urethra and into the bladder.  I exchanged for a bipolar working element and resected the area of interest.  I fulgurated the resection bed and collected the specimen.  I reinspected the resection bed and there was no active bleeding.  There was no bladder perforation.  I drained the bladder and withdrew the scope.  Patient tolerated the procedure well was stable postoperatively.  Plan: Patient will follow-up in 1 week for pathology review

## 2022-05-28 NOTE — H&P (Signed)
CC/HPI: Patient is a 87 year old white male underwent cystoscopy and TURBT by Dr. Alvester Morin yesterday 01/13/2022. Was sent home with 16 French Foley catheter. This apparently clotted off overnight seen back in the emergency room. The Foley catheter was changed x 2 with last catheter being placed being at a 20 [place] Foley. Patient was discharged home. Is seen now as an urgent work in as catheter does not seem to be draining. Since discharge home from the emergency room.  Patient had 20 [place] Foley in place. I irrigated with approximately 3 to 4 L of saline with removal of a lot of of clot. Subsequently changed out the catheter to a 22 [place] three-way hematuria catheter. Irrigated Foley no remaining clots but urine remains bright pink in color. Allowed the catheter to sit without irrigation and became dark in color with clots. Subsequently initiated CBI at brisk CBI and it remains pink in coloron brisk CBI.   01/21/2022  Patient status post TURBT. This revealed high-grade T1 urothelial cell carcinoma. Detrusor was present and not involved. Entire tumor was resected during the surgery. Bladder was very thin during the surgery. Surgery was also complicated by hematuria requiring clot evacuation and fulguration on postoperative day 1/2. Patient is now doing a lot better. Presents for pathology review and voiding trial.   05/04/2022  Patient presents for surveillance cystoscopy. No interval hematuria or dysuria.     ALLERGIES: Morphine Derivatives    MEDICATIONS: Amlodipine Besylate 10 mg tablet Oral  Aspirin Ec 81 mg tablet, delayed release Oral  Metformin Hcl Er 500 mg tablet, extended release 24 hr Oral  Pravastatin Sodium 40 mg tablet  Telmisartan 20 mg tablet  Vitamin D3     GU PSH: Bladder Instill AntiCA Agent - 03/16/2022, 03/09/2022, 03/02/2022, 02/23/2022, 02/16/2022, 02/09/2022 Cysto Bladder Ureth Biopsy - 2017 Cystoscopy - 01/07/2022, 2019, 2018, 2018, 2018, 2018, 2017 Cystoscopy Fulguration -  01/14/2022 Cystoscopy TURBT >5 cm - 01/13/2022 Cystoscopy TURBT 2-5 cm - 2017 Locm 300-399Mg /Ml Iodine,1Ml - 01/07/2022       PSH Notes: Cystoscopy With Fulguration Medium Lesion (2-5cm), Coronary Artery Single Venous Bypass Graft   NON-GU PSH: Back Surgery (Unspecified) CABG (coronary artery bypass grafting) - 2017 Heart Surgery (Unspecified)     GU PMH: Bladder Cancer Lateral - 03/16/2022, - 03/09/2022, - 03/02/2022, - 02/23/2022, - 02/16/2022, - 02/09/2022, - 01/21/2022, - 01/14/2022, - 01/07/2022, - 01/07/2022, - 01/05/2022, - 2019, - 2018, - 2018, - 2018, - 2017, - 2017, - 2017 Gross hematuria - 01/14/2022, - 01/07/2022, - 01/05/2022, Gross hematuria, - 2017 BPH w/o LUTS - 2018, - 2018, BPH (benign prostatic hyperplasia), - 2017 Hemorrhagic cystitis - 2017 Bladder, Neoplasm of Unspecified behavior, Bladder tumor - 2017    NON-GU PMH: Other nonspecific abnormal finding of lung field, Lung mass - 2017 Encounter for general adult medical examination without abnormal findings, Encounter for preventive health examination - 2017 Personal history of other diseases of the circulatory system, History of hypertension - 2017, History of cardiac murmur, - 2017 Personal history of other endocrine, nutritional and metabolic disease, History of diabetes mellitus - 2017, History of hypercholesterolemia, - 2017 Arthritis Benign intracranial hypertension Cardiac murmur, unspecified Diabetes insipidus Diabetes Type 2 Hypertension    FAMILY HISTORY: 1 Daughter - Other 2 sons - Other Benign hematuria - Runs In Family Kidney Failure - Brother Tuberculosis - Mother   SOCIAL HISTORY: Marital Status: Married Preferred Language: English; Ethnicity: Not Hispanic Or Latino; Race: White Current Smoking Status: Patient does not smoke anymore.  Drinks  2 caffeinated drinks per day. Patient's occupation is/was retired.     Notes: Former smoker, Retired, Mother deceased, Caffeine use, Married, Alcohol use, Number of  children, Father deceased   REVIEW OF SYSTEMS:    GU Review Male:   Patient denies frequent urination, hard to postpone urination, burning/ pain with urination, get up at night to urinate, leakage of urine, stream starts and stops, trouble starting your stream, have to strain to urinate , erection problems, and penile pain.  Gastrointestinal (Upper):   Patient denies nausea, vomiting, and indigestion/ heartburn.  Gastrointestinal (Lower):   Patient denies diarrhea and constipation.  Constitutional:   Patient denies fever, night sweats, weight loss, and fatigue.  Skin:   Patient denies skin rash/ lesion and itching.  Eyes:   Patient denies blurred vision and double vision.  Ears/ Nose/ Throat:   Patient denies sore throat and sinus problems.  Hematologic/Lymphatic:   Patient denies swollen glands and easy bruising.  Cardiovascular:   Patient denies leg swelling and chest pains.  Respiratory:   Patient denies cough and shortness of breath.  Endocrine:   Patient denies excessive thirst.  Musculoskeletal:   Patient denies back pain and joint pain.  Neurological:   Patient denies headaches and dizziness.  Psychologic:   Patient denies depression and anxiety.   VITAL SIGNS: None   GU PHYSICAL EXAMINATION:    Penis: Circumcised, no warts, no cracks. No dorsal Peyronie's plaques, no left corporal Peyronie's plaques, no right corporal Peyronie's plaques, no scarring, no warts. No balanitis, no meatal stenosis.   MULTI-SYSTEM PHYSICAL EXAMINATION:    Constitutional: Well-nourished. No physical deformities. Normally developed. Good grooming.  Respiratory: No labored breathing, no use of accessory muscles.   Eyes: Normal conjunctivae. Normal eyelids.  Musculoskeletal: Normal gait and station of head and neck.     Complexity of Data:  Source Of History:  Patient  Records Review:   Previous Doctor Records, Previous Patient Records  Urine Test Review:   Urinalysis   PROCEDURES:         Flexible  Cystoscopy - 52000  Risks, benefits, and some of the potential complications of the procedure were discussed at length with the patient including infection, bleeding, voiding discomfort, urinary retention, fever, chills, sepsis, and others. All questions were answered. Informed consent was obtained. Antibiotic prophylaxis was given. Sterile technique and intraurethral analgesia were used.  Meatus:  Normal size. Normal location. Normal condition.  Urethra:  No strictures.  External Sphincter:  Normal.  Verumontanum:  Normal.  Prostate:  Borderline obstructing. Mild hyperplasia.  Bladder Neck:  Non-obstructing.  Ureteral Orifices:  Normal location. Normal size. Normal shape.   Bladder:  No trabeculation. Evidence of prior transurethral resection on the right lateral wall. Overlying the prior TUR site, he had a raised area concerning for potential reoccurrence that measured about 2 cm on the right lateral wall. Some erythema on other areas of the right lateral wall overlying prior scar      The lower urinary tract was carefully examined. The procedure was well-tolerated and without complications. Antibiotic instructions were given. Instructions were given to call the office immediately for bloody urine, difficulty urinating, urinary retention, painful or frequent urination, fever, chills, nausea, vomiting or other illness. The patient stated that he understood these instructions and would comply with them.         Urinalysis w/Scope Dipstick Dipstick Cont'd Micro  Color: Yellow Bilirubin: Neg mg/dL WBC/hpf: 6 - 16/XWR  Appearance: Clear Ketones: Neg mg/dL RBC/hpf: 10 - 60/AVW  Specific Gravity: 1.025 Blood: 3+ ery/uL Bacteria: Few (10-25/hpf)  pH: <=5.0 Protein: 1+ mg/dL Cystals: NS (Not Seen)  Glucose: Neg mg/dL Urobilinogen: 0.2 mg/dL Casts: NS (Not Seen)    Nitrites: Neg Trichomonas: Not Present    Leukocyte Esterase: Neg leu/uL Mucous: Present      Epithelial Cells: 0 - 5/hpf      Yeast: NS  (Not Seen)      Sperm: Not Present    ASSESSMENT:      ICD-10 Details  1 GU:   Bladder Cancer Lateral - C67.2 Chronic, Stable   PLAN:           Orders Labs Urine Culture, Urine Cytology  Lab Notes: Voided urine cytology          Document Letter(s):  Created for Patient: Clinical Summary         Notes:   Recommend transurethral resection of bladder tumor with bilateral retrograde pyelogram. Risk benefits discussed.   CC: Dr. Posey Rea   Signed by Modena Slater, III, M.D. on 05/04/22 at 3:49 PM (EDT)

## 2022-05-29 ENCOUNTER — Encounter (HOSPITAL_COMMUNITY): Payer: Self-pay | Admitting: Urology

## 2022-05-31 ENCOUNTER — Ambulatory Visit: Payer: Medicare Other | Attending: Cardiovascular Disease | Admitting: Cardiovascular Disease

## 2022-05-31 ENCOUNTER — Encounter: Payer: Self-pay | Admitting: Cardiovascular Disease

## 2022-05-31 VITALS — BP 132/76 | HR 79 | Ht 69.0 in | Wt 186.4 lb

## 2022-05-31 DIAGNOSIS — I350 Nonrheumatic aortic (valve) stenosis: Secondary | ICD-10-CM

## 2022-05-31 DIAGNOSIS — I2510 Atherosclerotic heart disease of native coronary artery without angina pectoris: Secondary | ICD-10-CM | POA: Diagnosis not present

## 2022-05-31 DIAGNOSIS — E785 Hyperlipidemia, unspecified: Secondary | ICD-10-CM | POA: Diagnosis not present

## 2022-05-31 DIAGNOSIS — I10 Essential (primary) hypertension: Secondary | ICD-10-CM

## 2022-05-31 NOTE — Patient Instructions (Signed)
Medication Instructions:  No changes *If you need a refill on your cardiac medications before your next appointment, please call your pharmacy*   Lab Work: none If you have labs (blood work) drawn today and your tests are completely normal, you will receive your results only by: MyChart Message (if you have MyChart) OR A paper copy in the mail If you have any lab test that is abnormal or we need to change your treatment, we will call you to review the results.   Testing/Procedures: ECHO DUE JAN 2025 Your physician has requested that you have an echocardiogram. Echocardiography is a painless test that uses sound waves to create images of your heart. It provides your doctor with information about the size and shape of your heart and how well your heart's chambers and valves are working. This procedure takes approximately one hour. There are no restrictions for this procedure. Please do NOT wear cologne, perfume, aftershave, or lotions (deodorant is allowed). Please arrive 15 minutes prior to your appointment time.   Follow-Up: At [facility], you and your health needs are our priority.  As part of our continuing mission to provide you with exceptional heart care, we have created designated Provider Care Teams.  These Care Teams include your primary Cardiologist (physician) and Advanced Practice Providers (APPs -  Physician Assistants and Nurse Practitioners) who all work together to provide you with the care you need, when you need it.    Your next appointment:   12 month(s)  Provider:   Verne Carrow, MD

## 2022-05-31 NOTE — Progress Notes (Signed)
Chief Complaint  Patient presents with   Follow-up    CAD   History of Present Illness: 87 yo male with history of CAD s/p 1V CABG in 1998, carotid artery disease, PAD, HTN, HLD, CKD stage 3, DM, bladder cancer and chronic RBBB who is here today for follow up. He was admitted to [facility] in January 2024 with chest pain, mild troponin elevation. He was started on heparin but developed hematuria so no cardiac cath was performed. He was not started on Plavix due to bleeding risk and was not started on a beta blocker due to prior intolerance. Echo 02/02/22 with LVEF=55%. Mild MR. Moderate aortic stenosis with mean gradient 23 mmHg, DI 0.35, AVA 1.2 cm2. He was seen in our office 02/18/22 and was doing well.   He is here today for follow up. The patient denies any chest pain, dyspnea, palpitations, lower extremity edema, orthopnea, PND, dizziness, near syncope or syncope.   Primary Care Physician: Tresa Garter, MD   Past Medical History:  Diagnosis Date   Benign localized prostatic hyperplasia with lower urinary tract symptoms (LUTS)    Bladder cancer [facility]) dx 05/ 2017--  urologist- dr Sherryl Barters   TCC , Ta of bladder  s/p TURBT 06-02-2015   CAD (coronary artery disease)    no cardiologist--  followed by pcp,  dr plontnikov   Chronic low back pain with sciatica    CKD (chronic kidney disease), stage III (HCC)    Diabetes mellitus, type 2 [facility])    ED (erectile dysfunction)    Full dentures    Heart murmur    History of ETT    08-09-2014   fair exercise tolerance, no chest pain, normal BP response, no ST changes (baseline RBBB);  negative adequate ETT   History of kidney stones    Hyperlipidemia    Hypertension    Hypogonadism male    OA (osteoarthritis)    Right bundle branch block (RBBB)    S/P CABG x 1    1998   Sigmoid diverticulosis    Ventral hernia    midline   Wears glasses     Past Surgical History:  Procedure Laterality Date   COLONOSCOPY WITH PROPOFOL  02-01-2012    CORONARY ARTERY BYPASS GRAFT  1998  at [facility]   x1  vessel    CYSTOSCOPY WITH BIOPSY N/A 10/08/2015   Procedure: CYSTOSCOPY WITH BLADDER BIOPSY;  Surgeon: Hildred Laser, MD;  Location: [facility];  Service: Urology;  Laterality: N/A;   CYSTOSCOPY WITH FULGERATION N/A 10/08/2015   Procedure: CYSTOSCOPY WITH FULGERATION;  Surgeon: Hildred Laser, MD;  Location: [facility];  Service: Urology;  Laterality: N/A;   CYSTOSCOPY WITH FULGERATION N/A 01/14/2022   Procedure: CYSTOSCOPY WITH FULGERATION;  Surgeon: Crista Elliot, MD;  Location: [facility];  Service: Urology;  Laterality: N/A;   TONSILLECTOMY  age 51   TRANSURETHRAL RESECTION OF BLADDER TUMOR Bilateral 05/28/2022   Procedure: TRANSURETHRAL RESECTION OF BLADDER TUMOR (TURBT)  BILATERAL RETROGRADE PYELOGRAM;  Surgeon: Crista Elliot, MD;  Location: [facility];  Service: Urology;  Laterality: Bilateral;  1 HR FOR CASE   TRANSURETHRAL RESECTION OF BLADDER TUMOR WITH GYRUS (TURBT-GYRUS) N/A 06/02/2015   Procedure: TRANSURETHRAL RESECTION OF BLADDER TUMOR WITH GYRUS (TURBT-GYRUS);  Surgeon: Hildred Laser, MD;  Location: [facility];  Service: Urology;  Laterality: N/A;    Current Outpatient Medications  Medication Sig Dispense Refill  amLODipine (NORVASC) 5 MG tablet TAKE 1 TABLET BY MOUTH  DAILY (Patient taking differently: Take 5 mg by mouth daily.) 90 tablet 3   aspirin 81 MG tablet Take 2 tablets (162 mg total) by mouth daily. (Patient taking differently: Take 81 mg by mouth daily.) 100 tablet 3   cholecalciferol (VITAMIN D) 25 MCG (1000 UT) tablet Take 1 tablet (1,000 Units total) by mouth every morning. Follow-up due in April must see provider for refills (Patient taking differently: Take 1,000 Units by mouth every morning.) 90 tablet 0   Cinnamon 500 MG capsule Take 1,000 mg by mouth 3 (three) times a week.     HYDROcodone-acetaminophen (NORCO/VICODIN) 5-325 MG tablet Take  1 tablet by mouth every 6 (six) hours as needed for moderate pain or severe pain. 8 tablet 0   isosorbide mononitrate (IMDUR) 30 MG 24 hr tablet Take 1 tablet (30 mg total) by mouth daily. 90 tablet 3   metFORMIN (GLUCOPHAGE-XR) 500 MG 24 hr tablet TAKE 1 TABLET BY MOUTH  TWICE DAILY (Patient taking differently: Take 500 mg by mouth 2 (two) times daily with a meal.) 200 tablet 2   repaglinide (PRANDIN) 1 MG tablet Take 1 tablet (1 mg total) by mouth 3 (three) times daily before meals. 90 tablet 11   telmisartan-hydrochlorothiazide (MICARDIS HCT) 80-25 MG tablet Take 1 tablet by mouth daily. 90 tablet 3   TYLENOL 500 MG tablet Take 500 mg by mouth as needed for moderate pain.     ZYPITAMAG 4 MG TABS 1 PO QD (Patient taking differently: Take 4 mg by mouth daily.) 90 tablet 3   No current facility-administered medications for this visit.    Allergies  Allergen Reactions   Atorvastatin Other (See Comments)    Weak legs   Coreg [Carvedilol] Other (See Comments)    weakness   Lovastatin Other (See Comments)    Leg weakness   Morphine Sulfate Other (See Comments)    Per the pt: "After taking morphine, within 30 mins, I was soaking wet. I did not like the way it made me feel."    Social History   Socioeconomic History   Marital status: Married    Spouse name: Not on file   Number of children: Not on file   Years of education: Not on file   Highest education level: Not on file  Occupational History   Occupation: retired    Comment: but working fulltime  Tobacco Use   Smoking status: Former    Years: 15    Types: Cigarettes    Quit date: 01/18/1971    Years since quitting: 51.4   Smokeless tobacco: Current    Types: Snuff  Vaping Use   Vaping Use: Never used  Substance and Sexual Activity   Alcohol use: No   Drug use: No   Sexual activity: Not Currently  Other Topics Concern   Not on file  Social History Narrative   Regular exercise-yes   Social Determinants of Health    Financial Resource Strain: Low Risk  (05/20/2022)   Overall Financial Resource Strain (CARDIA)    Difficulty of Paying Living Expenses: Not hard at all  Food Insecurity: No Food Insecurity (05/20/2022)   Hunger Vital Sign    Worried About Running Out of Food in the Last Year: Never true    Ran Out of Food in the Last Year: Never true  Transportation Needs: No Transportation Needs (05/20/2022)   PRAPARE - Administrator, Civil Service (Medical):  No    Lack of Transportation (Non-Medical): No  Physical Activity: Inactive (05/20/2022)   Exercise Vital Sign    Days of Exercise per Week: 0 days    Minutes of Exercise per Session: 0 min  Stress: No Stress Concern Present (05/20/2022)   [employer] of Occupational Health - Occupational Stress Questionnaire    Feeling of Stress : Not at all  Social Connections: Socially Integrated (05/20/2022)   Social Connection and Isolation Panel [NHANES]    Frequency of Communication with Friends and Family: More than three times a week    Frequency of Social Gatherings with Friends and Family: More than three times a week    Attends Religious Services: More than 4 times per year    Active Member of [employer] or Organizations: Yes    Attends Engineer, structural: More than 4 times per year    Marital Status: Married  Catering manager Violence: Not At Risk (05/20/2022)   Humiliation, Afraid, Rape, and Kick questionnaire    Fear of Current or Ex-Partner: No    Emotionally Abused: No    Physically Abused: No    Sexually Abused: No    Family History  Problem Relation Age of Onset   Arthritis Mother 55       RA   Alcohol abuse Father 45       exposure   Diabetes Brother    Kidney disease Brother    Heart disease Brother    Hypertension Other    Colon cancer Neg Hx    Esophageal cancer Neg Hx    Rectal cancer Neg Hx    Stomach cancer Neg Hx     Review of Systems:  As stated in the HPI and otherwise negative.   BP 132/76   Pulse  79   Ht 5\' 9"  (1.753 m)   Wt 84.6 kg   SpO2 97%   BMI 27.53 kg/m   Physical Examination: General: Well developed, well nourished, NAD  HEENT: OP clear, mucus membranes moist  SKIN: warm, dry. No rashes. Neuro: No focal deficits  Musculoskeletal: Muscle strength 5/5 all ext  Psychiatric: Mood and affect normal  Neck: No JVD, no carotid bruits, no thyromegaly, no lymphadenopathy.  Lungs:Clear bilaterally, no wheezes, rhonci, crackles Cardiovascular: Regular rate and rhythm. Harsh systolic murmur.  Abdomen:Soft. Bowel sounds present. Non-tender.  Extremities: No lower extremity edema. Pulses are 2 + in the bilateral DP/PT.  EKG:  EKG is not ordered today. The ekg ordered today demonstrates   Echo January 2024: 1. Left ventricular ejection fraction, by estimation, is 55%. The left  ventricle has low normal function. There is mild left ventricular  hypertrophy. Left ventricular diastolic parameters are indeterminate.   2. Right ventricular systolic function is normal. The right ventricular  size is normal. There is normal pulmonary artery systolic pressure.   3. The mitral valve is normal in structure. Mild mitral valve  regurgitation.   4. AV is thickened, calcified with restricted motion. Peak and mean  gradients through the valve are 45 and 23 mm Hg respectively AVA (VTI) is  1.2 cm2 DVI is 0.35 consistent with moderate AS. [name] The aortic valve is  tricuspid. Aortic valve regurgitation is   mild.   5. The inferior vena cava is normal in size with greater than 50%  respiratory variability, suggesting right atrial pressure of 3 mmHg.    Recent Labs: 07/23/2021: TSH 1.51 02/02/2022: B Natriuretic Peptide 363.6 02/03/2022: Magnesium 2.1 05/24/2022: ALT 14; BUN 21;  Creatinine, Ser 1.26; Hemoglobin 14.5; Platelets 222.0; Potassium 3.5; Sodium 140   Lipid Panel    Component Value Date/Time   CHOL 95 05/24/2022 1040   TRIG 115.0 05/24/2022 1040   HDL 30.80 (L) 05/24/2022 1040    CHOLHDL 3 05/24/2022 1040   VLDL 23.0 05/24/2022 1040   LDLCALC 41 05/24/2022 1040   LDLDIRECT 78.4 11/02/2011 0936     Wt Readings from Last 3 Encounters:  05/31/22 84.6 kg  05/28/22 83.9 kg  05/26/22 82.6 kg    Assessment and Plan:   1. CAD s/p CABG with stable angina: No change in chronic chest pain. Continue ASA, Livalo and Imdur.   2. Moderate aortic stenosis: Moderate AS by echo in January 2024. Will plan to repeat his echo in January 2025.   3. PAD: Known to have occlusion of the right posterior tibial artery and left anterior tibial artery. Normal ABI February 2024. Followed by Vascular surgery  4. HTN: BP is controlled. No changes  5. HLD: LDL 41 in May 2024. Continue Livalo.    Labs/ tests ordered today include:   Orders Placed This Encounter  Procedures   ECHOCARDIOGRAM COMPLETE   Disposition:   F/U with me in one year   Signed, Verne Carrow, MD, [facility] 05/31/2022 10:28 AM    [facility] [address] [place], [place], [place]  [address] Phone: [phone]; Fax: [phone]

## 2022-06-01 LAB — SURGICAL PATHOLOGY

## 2022-06-03 DIAGNOSIS — C672 Malignant neoplasm of lateral wall of bladder: Secondary | ICD-10-CM | POA: Diagnosis not present

## 2022-06-24 ENCOUNTER — Other Ambulatory Visit: Payer: Self-pay | Admitting: Internal Medicine

## 2022-07-09 DIAGNOSIS — C672 Malignant neoplasm of lateral wall of bladder: Secondary | ICD-10-CM | POA: Diagnosis not present

## 2022-07-09 DIAGNOSIS — Z5111 Encounter for antineoplastic chemotherapy: Secondary | ICD-10-CM | POA: Diagnosis not present

## 2022-07-16 DIAGNOSIS — C672 Malignant neoplasm of lateral wall of bladder: Secondary | ICD-10-CM | POA: Diagnosis not present

## 2022-07-16 DIAGNOSIS — Z5111 Encounter for antineoplastic chemotherapy: Secondary | ICD-10-CM | POA: Diagnosis not present

## 2022-07-22 ENCOUNTER — Other Ambulatory Visit: Payer: Self-pay | Admitting: Internal Medicine

## 2022-07-23 DIAGNOSIS — Z5111 Encounter for antineoplastic chemotherapy: Secondary | ICD-10-CM | POA: Diagnosis not present

## 2022-07-23 DIAGNOSIS — C672 Malignant neoplasm of lateral wall of bladder: Secondary | ICD-10-CM | POA: Diagnosis not present

## 2022-09-09 DIAGNOSIS — C672 Malignant neoplasm of lateral wall of bladder: Secondary | ICD-10-CM | POA: Diagnosis not present

## 2022-09-14 ENCOUNTER — Other Ambulatory Visit: Payer: Self-pay

## 2022-09-14 ENCOUNTER — Ambulatory Visit (INDEPENDENT_AMBULATORY_CARE_PROVIDER_SITE_OTHER): Payer: Medicare Other | Admitting: Physician Assistant

## 2022-09-14 ENCOUNTER — Encounter: Payer: Self-pay | Admitting: Physician Assistant

## 2022-09-14 DIAGNOSIS — M79601 Pain in right arm: Secondary | ICD-10-CM

## 2022-09-14 NOTE — Progress Notes (Signed)
Office Visit Note   Patient: Jeff Burgess           Date of Birth: [birth year]           MRN: [id number] Visit Date: 09/14/2022              Requested by: Tresa Garter, MD [address] [place],  [place] [address] PCP: Plotnikov, Georgina Quint, MD   Assessment & Plan: Visit Diagnoses:  1. Right arm pain     Plan: Pleasant 87 year old gentleman with a long history of right neck shoulder pain with paresthesias in his upper arm.  Also complains of some numbness in his thumb index and long finger.  Denies any particular injury but has been very active his whole life.  I do think this is more based on x-rays from arthritis in his neck he is somewhat stiff in there with movement especially extending and turning to the left reproduces some of the paresthesias in his upper arm.  He has actually good movement of his shoulder.  Cannot really find any impingement symptoms.  Symptoms in his finger could be from the neck but given the distribution could also be mild carpal tunnel syndrome.  Will start with some physical therapy which she can do near his house per his preference.  If he did not improve in a month could consider an MRI with possible injections.  Could also consider at some point electrodiagnostic studies to rule out carpal tunnel though his symptoms are very mild at this time  Follow-Up Instructions: Return in about 1 month (around 10/14/2022).   Orders:  Orders Placed This Encounter  Procedures   XR Cervical Spine 2 or 3 views   Ambulatory referral to Physical Therapy   No orders of the defined types were placed in this encounter.     Procedures: No procedures performed   Clinical Data: No additional findings.   Subjective: Chief Complaint  Patient presents with   Right Shoulder - Pain    HPI patient is a pleasant 87 year old gentleman with a chief complaint of right shoulder superior pain on and off.  Denies any particular injury.  He describes tingling in the  upper arm and numbness in his fingers.  Feels he has good shoulder range of motion not currently taking any medication for this.  Denies any particular injury rates his pain as moderate on and off seems to be related to whether it is warm or cold  Review of Systems  All other systems reviewed and are negative.    Objective: Vital Signs: There were no vitals taken for this visit.  Physical Exam Constitutional:      Appearance: Normal appearance.  Pulmonary:     Effort: Pulmonary effort is normal.  Neurological:     General: No focal deficit present.     Mental Status: He is alert.  Psychiatric:        Mood and Affect: Mood normal.        Behavior: Behavior normal.     Ortho Exam Examination he does have some tenderness in the right paraspinal muscles.  No step-off no deformity.  He has full range of motion of his arm without any impingement findings.  Grip strength is appropriate has good triceps biceps abductor strength.  He does have reproducible symptoms with extension of his neck and turning to the left.  He is neurovascularly otherwise intact Specialty Comments:  No specialty comments available.  Imaging: No results found.  PMFS History: Patient Active Problem List   Diagnosis Date Noted   NSTEMI (non-ST elevated myocardial infarction) (HCC) 02/01/2022   Hematuria 01/14/2022   Wrist pain, left 10/26/2021   Lumbar foraminal stenosis 07/03/2019   Preop exam for internal medicine 06/25/2019   Ataxia 05/14/2019   Hip pain, bilateral 01/16/2018   Trochanteric bursitis 06/17/2017   Double vision 03/09/2016   Gait disorder 12/08/2015   Dysuria 10/28/2015   Bladder cancer (HCC) 08/13/2015   Sialoadenitis 01/01/2015   RBBB 07/12/2014   Pyogenic granuloma 02/06/2013   Well adult exam 11/11/2011   DM (diabetes mellitus), type 2 with renal complications (HCC) 03/10/2011   Hypogonadism male 03/10/2011   Fatigue 11/10/2010   Hyperkalemia 07/06/2010   Neoplasm of  uncertain behavior of skin 10/14/2009   UNS ADVRS EFF UNS RX MEDICINAL&BIOLOGICAL SBSTNC 04/25/2008   SEBORRHEIC DERMATITIS 12/28/2007   Actinic keratosis 12/28/2007   Disorder resulting from impaired renal function 09/30/2006   LOW BACK PAIN 09/30/2006   Dyslipidemia 07/26/2006   Essential hypertension 07/26/2006   Coronary atherosclerosis 07/26/2006   Osteoarthritis 07/26/2006   Past Medical History:  Diagnosis Date   Benign localized prostatic hyperplasia with lower urinary tract symptoms (LUTS)    Bladder [facility]) dx 05/ 2017--  urologist- dr Sherryl Barters   TCC , Ta of bladder  s/p TURBT 06-02-2015   CAD (coronary artery disease)    no cardiologist--  followed by pcp,  dr plontnikov   Chronic low back pain with sciatica    CKD (chronic kidney disease), stage III (HCC)    Diabetes mellitus, type 2 (HCC)    ED (erectile dysfunction)    Full dentures    Heart murmur    History of ETT    08-09-2014   fair exercise tolerance, no chest pain, normal BP response, no ST changes (baseline RBBB);  negative adequate ETT   History of kidney stones    Hyperlipidemia    Hypertension    Hypogonadism male    OA (osteoarthritis)    Right bundle branch block (RBBB)    S/P CABG x 1    1998   Sigmoid diverticulosis    Ventral hernia    midline   Wears glasses     Family History  Problem Relation Age of Onset   Arthritis Mother 44       RA   Alcohol abuse Father 81       exposure   Diabetes Brother    Kidney disease Brother    Heart disease Brother    Hypertension Other    Colon cancer Neg Hx    Esophageal cancer Neg Hx    Rectal cancer Neg Hx    Stomach cancer Neg Hx     Past Surgical History:  Procedure Laterality Date   COLONOSCOPY WITH PROPOFOL  02-01-2012   CORONARY ARTERY BYPASS GRAFT  1998  at [facility]   x1  vessel    CYSTOSCOPY WITH BIOPSY N/A 10/08/2015   Procedure: CYSTOSCOPY WITH BLADDER BIOPSY;  Surgeon: Hildred Laser, MD;  Location: [facility];  Service: Urology;  Laterality: N/A;   CYSTOSCOPY WITH FULGERATION N/A 10/08/2015   Procedure: CYSTOSCOPY WITH FULGERATION;  Surgeon: Hildred Laser, MD;  Location: [facility];  Service: Urology;  Laterality: N/A;   CYSTOSCOPY WITH FULGERATION N/A 01/14/2022   Procedure: CYSTOSCOPY WITH FULGERATION;  Surgeon: Crista Elliot, MD;  Location: [facility];  Service: Urology;  Laterality: N/A;   TONSILLECTOMY  age 89   TRANSURETHRAL RESECTION OF BLADDER TUMOR Bilateral 05/28/2022   Procedure: TRANSURETHRAL RESECTION OF BLADDER TUMOR (TURBT)  BILATERAL RETROGRADE PYELOGRAM;  Surgeon: Crista Elliot, MD;  Location: [facility];  Service: Urology;  Laterality: Bilateral;  1 HR FOR CASE   TRANSURETHRAL RESECTION OF BLADDER TUMOR WITH GYRUS (TURBT-GYRUS) N/A 06/02/2015   Procedure: TRANSURETHRAL RESECTION OF BLADDER TUMOR WITH GYRUS (TURBT-GYRUS);  Surgeon: Hildred Laser, MD;  Location: [facility];  Service: Urology;  Laterality: N/A;   Social History   Occupational History   Occupation: retired    Comment: but working fulltime  Tobacco Use   Smoking status: Former    Current packs/day: 0.00    Types: Cigarettes    Start date: 01/18/1956    Quit date: 01/18/1971    Years since quitting: 51.6   Smokeless tobacco: Current    Types: Snuff  Vaping Use   Vaping status: Never Used  Substance and Sexual Activity   Alcohol use: No   Drug use: No   Sexual activity: Not Currently

## 2022-09-17 LAB — HM DIABETES EYE EXAM

## 2022-09-20 DIAGNOSIS — E119 Type 2 diabetes mellitus without complications: Secondary | ICD-10-CM | POA: Diagnosis not present

## 2022-09-20 DIAGNOSIS — H2513 Age-related nuclear cataract, bilateral: Secondary | ICD-10-CM | POA: Diagnosis not present

## 2022-10-13 DIAGNOSIS — L578 Other skin changes due to chronic exposure to nonionizing radiation: Secondary | ICD-10-CM | POA: Diagnosis not present

## 2022-10-13 DIAGNOSIS — L218 Other seborrheic dermatitis: Secondary | ICD-10-CM | POA: Diagnosis not present

## 2022-10-13 DIAGNOSIS — L570 Actinic keratosis: Secondary | ICD-10-CM | POA: Diagnosis not present

## 2022-10-13 DIAGNOSIS — D492 Neoplasm of unspecified behavior of bone, soft tissue, and skin: Secondary | ICD-10-CM | POA: Diagnosis not present

## 2022-10-13 DIAGNOSIS — C44319 Basal cell carcinoma of skin of other parts of face: Secondary | ICD-10-CM | POA: Diagnosis not present

## 2022-10-14 ENCOUNTER — Ambulatory Visit: Payer: Medicare Other | Admitting: Physician Assistant

## 2022-10-16 ENCOUNTER — Other Ambulatory Visit: Payer: Self-pay | Admitting: Internal Medicine

## 2022-11-04 DIAGNOSIS — C44311 Basal cell carcinoma of skin of nose: Secondary | ICD-10-CM | POA: Diagnosis not present

## 2022-11-22 DIAGNOSIS — L570 Actinic keratosis: Secondary | ICD-10-CM | POA: Diagnosis not present

## 2022-12-20 ENCOUNTER — Ambulatory Visit: Payer: Medicare Other | Admitting: Internal Medicine

## 2022-12-20 ENCOUNTER — Encounter: Payer: Self-pay | Admitting: Internal Medicine

## 2022-12-20 VITALS — BP 118/78 | HR 85 | Temp 97.9°F | Ht 69.0 in | Wt 195.0 lb

## 2022-12-20 DIAGNOSIS — Z7984 Long term (current) use of oral hypoglycemic drugs: Secondary | ICD-10-CM

## 2022-12-20 DIAGNOSIS — E1169 Type 2 diabetes mellitus with other specified complication: Secondary | ICD-10-CM

## 2022-12-20 DIAGNOSIS — G8929 Other chronic pain: Secondary | ICD-10-CM

## 2022-12-20 DIAGNOSIS — E1129 Type 2 diabetes mellitus with other diabetic kidney complication: Secondary | ICD-10-CM | POA: Insufficient documentation

## 2022-12-20 DIAGNOSIS — E569 Vitamin deficiency, unspecified: Secondary | ICD-10-CM

## 2022-12-20 DIAGNOSIS — M5440 Lumbago with sciatica, unspecified side: Secondary | ICD-10-CM

## 2022-12-20 DIAGNOSIS — C678 Malignant neoplasm of overlapping sites of bladder: Secondary | ICD-10-CM | POA: Diagnosis not present

## 2022-12-20 DIAGNOSIS — R202 Paresthesia of skin: Secondary | ICD-10-CM

## 2022-12-20 DIAGNOSIS — E785 Hyperlipidemia, unspecified: Secondary | ICD-10-CM

## 2022-12-20 DIAGNOSIS — E1122 Type 2 diabetes mellitus with diabetic chronic kidney disease: Secondary | ICD-10-CM | POA: Diagnosis not present

## 2022-12-20 DIAGNOSIS — M48061 Spinal stenosis, lumbar region without neurogenic claudication: Secondary | ICD-10-CM

## 2022-12-20 DIAGNOSIS — I10 Essential (primary) hypertension: Secondary | ICD-10-CM | POA: Diagnosis not present

## 2022-12-20 DIAGNOSIS — E119 Type 2 diabetes mellitus without complications: Secondary | ICD-10-CM | POA: Insufficient documentation

## 2022-12-20 DIAGNOSIS — I2510 Atherosclerotic heart disease of native coronary artery without angina pectoris: Secondary | ICD-10-CM

## 2022-12-20 DIAGNOSIS — R269 Unspecified abnormalities of gait and mobility: Secondary | ICD-10-CM

## 2022-12-20 DIAGNOSIS — N1831 Chronic kidney disease, stage 3a: Secondary | ICD-10-CM

## 2022-12-20 LAB — CBC WITH DIFFERENTIAL/PLATELET
Basophils Absolute: 0.1 10*3/uL (ref 0.0–0.1)
Basophils Relative: 0.8 % (ref 0.0–3.0)
Eosinophils Absolute: 0.1 10*3/uL (ref 0.0–0.7)
Eosinophils Relative: 0.7 % (ref 0.0–5.0)
HCT: 42.5 % (ref 39.0–52.0)
Hemoglobin: 13.9 g/dL (ref 13.0–17.0)
Lymphocytes Relative: 17 % (ref 12.0–46.0)
Lymphs Abs: 1.9 10*3/uL (ref 0.7–4.0)
MCHC: 32.8 g/dL (ref 30.0–36.0)
MCV: 90.5 fL (ref 78.0–100.0)
Monocytes Absolute: 1.1 10*3/uL — ABNORMAL HIGH (ref 0.1–1.0)
Monocytes Relative: 9.9 % (ref 3.0–12.0)
Neutro Abs: 8 10*3/uL — ABNORMAL HIGH (ref 1.4–7.7)
Neutrophils Relative %: 71.6 % (ref 43.0–77.0)
Platelets: 231 10*3/uL (ref 150.0–400.0)
RBC: 4.69 Mil/uL (ref 4.22–5.81)
RDW: 13.7 % (ref 11.5–15.5)
WBC: 11.2 10*3/uL — ABNORMAL HIGH (ref 4.0–10.5)

## 2022-12-20 LAB — COMPREHENSIVE METABOLIC PANEL
ALT: 22 U/L (ref 0–53)
AST: 20 U/L (ref 0–37)
Albumin: 4 g/dL (ref 3.5–5.2)
Alkaline Phosphatase: 89 U/L (ref 39–117)
BUN: 30 mg/dL — ABNORMAL HIGH (ref 6–23)
CO2: 26 meq/L (ref 19–32)
Calcium: 9.8 mg/dL (ref 8.4–10.5)
Chloride: 106 meq/L (ref 96–112)
Creatinine, Ser: 1.45 mg/dL (ref 0.40–1.50)
GFR: 43.86 mL/min — ABNORMAL LOW (ref >60.00)
Glucose, Bld: 97 mg/dL (ref 70–99)
Potassium: 4 meq/L (ref 3.5–5.1)
Sodium: 143 meq/L (ref 135–145)
Total Bilirubin: 0.6 mg/dL (ref 0.2–1.2)
Total Protein: 7 g/dL (ref 6.0–8.3)

## 2022-12-20 LAB — LIPID PANEL
Cholesterol: 121 mg/dL (ref 0–200)
HDL: 32.4 mg/dL — ABNORMAL LOW (ref >39.00)
LDL Cholesterol: 59 mg/dL (ref 0–99)
NonHDL: 89.03
Total CHOL/HDL Ratio: 4
Triglycerides: 151 mg/dL — ABNORMAL HIGH (ref 0.0–149.0)
VLDL: 30.2 mg/dL (ref 0.0–40.0)

## 2022-12-20 LAB — TSH: TSH: 2.79 u[IU]/mL (ref 0.35–5.50)

## 2022-12-20 LAB — HEMOGLOBIN A1C: Hgb A1c MFr Bld: 7.1 % — ABNORMAL HIGH (ref 4.6–6.5)

## 2022-12-20 LAB — VITAMIN D 25 HYDROXY (VIT D DEFICIENCY, FRACTURES): VITD: 39.37 ng/mL (ref 30.00–100.00)

## 2022-12-20 LAB — VITAMIN B12: Vitamin B-12: 305 pg/mL (ref 211–911)

## 2022-12-20 NOTE — Assessment & Plan Note (Signed)
Using TENS/NMES

## 2022-12-20 NOTE — Assessment & Plan Note (Signed)
No CP ASA, Amlodipine, Pravastatin

## 2022-12-20 NOTE — Assessment & Plan Note (Signed)
UseTENS/NMES PT offered

## 2022-12-20 NOTE — Assessment & Plan Note (Signed)
Due to spinal stenosis Start silver sneakers, gym

## 2022-12-20 NOTE — Progress Notes (Signed)
Subjective:  Patient ID: Jeff Burgess, male    DOB: Nov 29, 1935  Age: 87 y.o. MRN: [id number]  CC: Medical Management of Chronic Issues (6 mnth f/u)   HPI ALMUS PUCILLO presents for HTN, CAD, DM, bladder cancer  Outpatient Medications Prior to Visit  Medication Sig Dispense Refill   amLODipine (NORVASC) 5 MG tablet TAKE 1 TABLET BY MOUTH DAILY 180 tablet 3   aspirin 81 MG tablet Take 2 tablets (162 mg total) by mouth daily. (Patient taking differently: Take 81 mg by mouth daily.) 100 tablet 3   cholecalciferol (VITAMIN D) 25 MCG (1000 UT) tablet Take 1 tablet (1,000 Units total) by mouth every morning. Follow-up due in April must see provider for refills (Patient taking differently: Take 1,000 Units by mouth every morning.) 90 tablet 0   Cinnamon 500 MG capsule Take 1,000 mg by mouth 3 (three) times a week.     HYDROcodone-acetaminophen (NORCO/VICODIN) 5-325 MG tablet Take 1 tablet by mouth every 6 (six) hours as needed for moderate pain or severe pain. 8 tablet 0   isosorbide mononitrate (IMDUR) 30 MG 24 hr tablet TAKE 1 TABLET BY MOUTH DAILY 100 tablet 2   metFORMIN (GLUCOPHAGE-XR) 500 MG 24 hr tablet TAKE 1 TABLET BY MOUTH TWICE  DAILY 200 tablet 2   repaglinide (PRANDIN) 1 MG tablet Take 1 tablet (1 mg total) by mouth 3 (three) times daily before meals. 90 tablet 11   telmisartan-hydrochlorothiazide (MICARDIS HCT) 80-25 MG tablet Take 1 tablet by mouth daily. 90 tablet 3   TYLENOL 500 MG tablet Take 500 mg by mouth as needed for moderate pain.     ZYPITAMAG 4 MG TABS 1 PO QD (Patient taking differently: Take 4 mg by mouth daily.) 90 tablet 3   No facility-administered medications prior to visit.    ROS: Review of Systems  Constitutional:  Positive for fatigue. Negative for appetite change and unexpected weight change.  HENT:  Negative for congestion, nosebleeds, sneezing, sore throat and trouble swallowing.   Eyes:  Negative for itching and visual disturbance.  Respiratory:   Negative for cough.   Cardiovascular:  Negative for chest pain, palpitations and leg swelling.  Gastrointestinal:  Negative for abdominal distention, blood in stool, diarrhea and nausea.  Genitourinary:  Negative for frequency and hematuria.  Musculoskeletal:  Positive for back pain and gait problem. Negative for arthralgias, joint swelling and neck pain.  Skin:  Negative for rash.  Neurological:  Negative for dizziness, tremors, speech difficulty and weakness.  Psychiatric/Behavioral:  Negative for agitation, dysphoric mood, sleep disturbance and suicidal ideas. The patient is not nervous/anxious.     Objective:  BP 118/78 (BP Location: Right Arm, Patient Position: Sitting, Cuff Size: Normal)   Pulse 85   Temp 97.9 F (36.6 C) (Oral)   Ht 5\' 9"  (1.753 m)   Wt 195 lb (88.5 kg)   SpO2 97%   BMI 28.80 kg/m   BP Readings from Last 3 Encounters:  12/20/22 118/78  05/31/22 132/76  05/28/22 (!) 166/79    Wt Readings from Last 3 Encounters:  12/20/22 195 lb (88.5 kg)  05/31/22 186 lb 6.4 oz (84.6 kg)  05/28/22 185 lb (83.9 kg)    Physical Exam Constitutional:      General: He is not in acute distress.    Appearance: He is well-developed. He is obese.     Comments: NAD  Eyes:     Conjunctiva/sclera: Conjunctivae normal.     Pupils: Pupils are equal, round,  and reactive to light.  Neck:     Thyroid: No thyromegaly.     Vascular: No JVD.  Cardiovascular:     Rate and Rhythm: Normal rate and regular rhythm.     Heart sounds: Normal heart sounds. No murmur heard.    No friction rub. No gallop.  Pulmonary:     Effort: Pulmonary effort is normal. No respiratory distress.     Breath sounds: Normal breath sounds. No wheezing or rales.  Chest:     Chest wall: No tenderness.  Abdominal:     General: Bowel sounds are normal. There is no distension.     Palpations: Abdomen is soft. There is no mass.     Tenderness: There is no abdominal tenderness. There is no guarding or rebound.   Musculoskeletal:        General: No tenderness. Normal range of motion.     Cervical back: Normal range of motion.  Lymphadenopathy:     Cervical: No cervical adenopathy.  Skin:    General: Skin is warm and dry.     Findings: No rash.  Neurological:     Mental Status: He is alert and oriented to person, place, and time.     Cranial Nerves: No cranial nerve deficit.     Motor: Weakness present. No abnormal muscle tone.     Coordination: Coordination normal.     Gait: Gait abnormal.     Deep Tendon Reflexes: Reflexes are normal and symmetric.  Psychiatric:        Behavior: Behavior normal.        Thought Content: Thought content normal.        Judgment: Judgment normal.   Using a cane Weak legs  Lab Results  Component Value Date   WBC 12.3 (H) 05/24/2022   HGB 14.5 05/24/2022   HCT 42.6 05/24/2022   PLT 222.0 05/24/2022   GLUCOSE 155 (H) 05/24/2022   CHOL 95 05/24/2022   TRIG 115.0 05/24/2022   HDL 30.80 (L) 05/24/2022   LDLDIRECT 78.4 11/02/2011   LDLCALC 41 05/24/2022   ALT 14 05/24/2022   AST 16 05/24/2022   NA 140 05/24/2022   K 3.5 05/24/2022   CL 103 05/24/2022   CREATININE 1.26 05/24/2022   BUN 21 05/24/2022   CO2 27 05/24/2022   TSH 1.51 07/23/2021   PSA 2.67 01/22/2021   INR 1.1 01/14/2022   HGBA1C 7.9 (H) 05/24/2022    DG C-Arm 1-60 Min-No Report  Result Date: 05/28/2022 Fluoroscopy was utilized by the requesting physician.  No radiographic interpretation.    Assessment & Plan:   Problem List Items Addressed This Visit     Dyslipidemia    Livalo 1/2 tablet every other day      Essential hypertension    Cont on Telmisartan-HCT, Amlodipine  5 mg/d      Coronary atherosclerosis - Primary    No CP ASA, Amlodipine, Pravastatin      LOW BACK PAIN    Due to spinal stenosis Start silver sneakers, gym      Relevant Orders   CBC with Differential/Platelet   Comprehensive metabolic panel   Hemoglobin A1c   Lipid panel   TSH   Vitamin  B12   VITAMIN D 25 Hydroxy (Vit-D Deficiency, Fractures)   DM (diabetes mellitus), type 2 with renal complications (HCC)    Cont on Prandin, Metformin      Bladder cancer (HCC)    F/u w/Dr Alvester Morin      Gait  disorder    Using TENS/NMES      Lumbar foraminal stenosis    UseTENS/NMES PT offered      Diabetes mellitus, type 2 (HCC)    Cont on Prandin      Relevant Orders   CBC with Differential/Platelet   Comprehensive metabolic panel   Hemoglobin A1c   Lipid panel   TSH   Vitamin B12   VITAMIN D 25 Hydroxy (Vit-D Deficiency, Fractures)   Other Visit Diagnoses     Paresthesia       Relevant Orders   Vitamin B12   Vitamin deficiency       Relevant Orders   VITAMIN D 25 Hydroxy (Vit-D Deficiency, Fractures)         No orders of the defined types were placed in this encounter.     Follow-up: No follow-ups on file.  Sonda Primes, MD

## 2022-12-20 NOTE — Assessment & Plan Note (Signed)
Cont on Telmisartan-HCT, Amlodipine  5 mg/d

## 2022-12-20 NOTE — Assessment & Plan Note (Signed)
Cont on Prandin ?

## 2022-12-20 NOTE — Assessment & Plan Note (Signed)
Cont on Prandin, Metformin ?

## 2022-12-20 NOTE — Assessment & Plan Note (Signed)
F/u w/Dr Bell 

## 2022-12-20 NOTE — Assessment & Plan Note (Signed)
Livalo 1/2 tablet every other day

## 2023-02-02 ENCOUNTER — Other Ambulatory Visit (HOSPITAL_COMMUNITY): Payer: Medicare Other

## 2023-02-03 DIAGNOSIS — C672 Malignant neoplasm of lateral wall of bladder: Secondary | ICD-10-CM | POA: Diagnosis not present

## 2023-02-04 ENCOUNTER — Other Ambulatory Visit: Payer: Self-pay | Admitting: Urology

## 2023-02-14 DIAGNOSIS — L814 Other melanin hyperpigmentation: Secondary | ICD-10-CM | POA: Diagnosis not present

## 2023-02-14 DIAGNOSIS — L570 Actinic keratosis: Secondary | ICD-10-CM | POA: Diagnosis not present

## 2023-02-14 DIAGNOSIS — D492 Neoplasm of unspecified behavior of bone, soft tissue, and skin: Secondary | ICD-10-CM | POA: Diagnosis not present

## 2023-02-14 DIAGNOSIS — D0439 Carcinoma in situ of skin of other parts of face: Secondary | ICD-10-CM | POA: Diagnosis not present

## 2023-02-14 DIAGNOSIS — Z85828 Personal history of other malignant neoplasm of skin: Secondary | ICD-10-CM | POA: Diagnosis not present

## 2023-02-14 DIAGNOSIS — D225 Melanocytic nevi of trunk: Secondary | ICD-10-CM | POA: Diagnosis not present

## 2023-02-14 DIAGNOSIS — L821 Other seborrheic keratosis: Secondary | ICD-10-CM | POA: Diagnosis not present

## 2023-02-14 DIAGNOSIS — Z08 Encounter for follow-up examination after completed treatment for malignant neoplasm: Secondary | ICD-10-CM | POA: Diagnosis not present

## 2023-02-16 NOTE — Patient Instructions (Signed)
 SURGICAL WAITING ROOM VISITATION  Patients having surgery or a procedure may have no more than 2 support people in the waiting area - these visitors may rotate.    Children under the age of 96 must have an adult with them who is not the patient.  Due to an increase in RSV and influenza rates and associated hospitalizations, children ages 22 and under may not visit patients in [facility] hospitals.  Visitors with respiratory illnesses are discouraged from visiting and should remain at home.  If the patient needs to stay at the hospital during part of their recovery, the visitor guidelines for inpatient rooms apply. Pre-op nurse will coordinate an appropriate time for 1 support person to accompany patient in pre-op.  This support person may not rotate.    Please refer to the [facility] website for the visitor guidelines for Inpatients (after your surgery is over and you are in a regular room).       Your procedure is scheduled on: 02-25-23   Report to [facility] Main Entrance    Report to admitting at       11:00 AM   Call this number if you have problems the morning of surgery [phone]   Do not eat food :After Midnight.   After Midnight you may have the following liquids until _0715 _____ AM/ DAY OF SURGERY  then nothing by mouth  Water  Non-Citrus Juices (without pulp, NO RED-Apple, White grape, White cranberry) Black Coffee (NO MILK/CREAM OR CREAMERS, sugar ok)  Clear Tea (NO MILK/CREAM OR CREAMERS, sugar ok) regular and decaf                             Plain Jell-O (NO RED)                                           Fruit ices (not with fruit pulp, NO RED)                                     Popsicles (NO RED)                                                               Sports drinks like Gatorade (NO RED)                            If you have questions, please contact your surgeon's office.   FOLLOW ANY ADDITIONAL PRE OP INSTRUCTIONS YOU RECEIVED FROM  YOUR SURGEON'S OFFICE!!!     Oral Hygiene is also important to reduce your risk of infection.                                    Remember - BRUSH YOUR TEETH THE MORNING OF SURGERY WITH YOUR REGULAR TOOTHPASTE  DENTURES WILL BE REMOVED PRIOR TO SURGERY PLEASE DO NOT APPLY Poly grip OR ADHESIVES!!!   Do NOT smoke after Midnight  Stop all vitamins and herbal supplements 7 days before surgery.   Take these medicines the morning of surgery with A SIP OF WATER : Zypitamag , tylenol  if needed, Imdur , amlodipine    DO NOT TAKE ANY ORAL DIABETIC MEDICATIONS DAY OF YOUR SURGERY  Bring CPAP mask and tubing day of surgery.                              You may not have any metal on your body including hair pins, jewelry, and body piercing             Do not wear , lotions, powders, /cologne, or deodorant              Men may shave face and neck.   Do not bring valuables to the hospital. [facility] IS NOT             RESPONSIBLE   FOR VALUABLES.   Contacts, glasses, dentures or bridgework may not be worn into surgery.   Bring small overnight bag day of surgery.   DO NOT BRING YOUR HOME MEDICATIONS TO THE HOSPITAL. PHARMACY WILL DISPENSE MEDICATIONS LISTED ON YOUR MEDICATION LIST TO YOU DURING YOUR ADMISSION IN THE HOSPITAL!    Patients discharged on the day of surgery will not be allowed to drive home.  Someone NEEDS to stay with you for the first 24 hours after anesthesia.   Special Instructions: Bring a copy of your healthcare power of attorney and living will documents the day of surgery if you haven't scanned them before.              Please read over the following fact sheets you were given: IF YOU HAVE QUESTIONS ABOUT YOUR PRE-OP INSTRUCTIONS PLEASE CALL [phone]   I If you test positive for Covid or have been in contact with anyone that has tested positive in the last 10 days please notify you surgeon.    [facility] - Preparing for Surgery Before surgery, you can play an  important role.  Because skin is not sterile, your skin needs to be as free of germs as possible.  You can reduce the number of germs on your skin by washing with CHG (chlorahexidine gluconate) soap before surgery.  CHG is an antiseptic cleaner which kills germs and bonds with the skin to continue killing germs even after washing. Please DO NOT use if you have an allergy to CHG or antibacterial soaps.  If your skin becomes reddened/irritated stop using the CHG and inform your nurse when you arrive at Short Stay. Do not shave (including legs and underarms) for at least 48 hours prior to the first CHG shower.  You may shave your face/neck. Please follow these instructions carefully:  1.  Shower with CHG Soap the night before surgery and the  morning of Surgery.  2.  If you choose to wash your hair, wash your hair first as usual with your  normal  shampoo.  3.  After you shampoo, rinse your hair and body thoroughly to remove the  shampoo.                           4.  Use CHG as you would any other liquid soap.  You can apply chg directly  to the skin and wash  Gently with a scrungie or clean washcloth.  5.  Apply the CHG Soap to your body ONLY FROM THE NECK DOWN.   Do not use on face/ open                           Wound or open sores. Avoid contact with eyes, ears mouth and genitals (private parts).                       Wash face,  Genitals (private parts) with your normal soap.             6.  Wash thoroughly, paying special attention to the area where your surgery  will be performed.  7.  Thoroughly rinse your body with warm water  from the neck down.  8.  DO NOT shower/wash with your normal soap after using and rinsing off  the CHG Soap.                9.  Pat yourself dry with a clean towel.            10.  Wear clean pajamas.            11.  Place clean sheets on your bed the night of your first shower and do not  sleep with pets. Day of Surgery : Do not apply any  lotions/deodorants the morning of surgery.  Please wear clean clothes to the hospital/surgery center.  FAILURE TO FOLLOW THESE INSTRUCTIONS MAY RESULT IN THE CANCELLATION OF YOUR SURGERY PATIENT SIGNATURE_________________________________  NURSE SIGNATURE__________________________________  ________________________________________________________________________

## 2023-02-16 NOTE — Progress Notes (Addendum)
 PCP - Karlynn Noel, MD lov 12-20-22 epic Cardiologist - [name], MD LOV 05-31-22 epic. Scheduled  for echo 02-23-23  PPM/ICD -  Device Orders -  Rep Notified -   Chest x-ray - 02-01-22 epic EKG -  Stress Test -  ECHO - 02-02-22 epic scheduled for echo 02-23-23 Cardiac Cath -   Sleep Study -  CPAP -   Fasting Blood Sugar -  Checks Blood Sugar __0___ times a day  Blood Thinner Instructions: Aspirin  Instructions: 81 mg stop 5 days prior  ERAS Protcol - PRE-SURGERY n/a    COVID vaccine -yes  Activity--Able to complete Adl's without CP or SOB  Anesthesia review: Moderate AS per echo 02-02-22, CAD, CABG 1998, HTN, murmur, DM2, CKD stage 3  Patient denies shortness of breath, fever, cough and chest pain at PAT appointment   All instructions explained to the patient, with a verbal understanding of the material. Patient agrees to go over the instructions while at home for a better understanding. Patient also instructed to self quarantine after being tested for COVID-19. The opportunity to ask questions was provided.

## 2023-02-18 ENCOUNTER — Encounter (HOSPITAL_COMMUNITY): Payer: Self-pay

## 2023-02-18 ENCOUNTER — Other Ambulatory Visit: Payer: Self-pay

## 2023-02-18 ENCOUNTER — Encounter (HOSPITAL_COMMUNITY)
Admission: RE | Admit: 2023-02-18 | Discharge: 2023-02-18 | Disposition: A | Discharge status: Home / Self Care | Payer: Medicare Other | Source: Ambulatory Visit | Attending: Urology | Admitting: Urology

## 2023-02-18 VITALS — BP 118/73 | HR 79 | Temp 97.8°F | Resp 16 | Ht 69.0 in | Wt 195.0 lb

## 2023-02-18 DIAGNOSIS — Z87891 Personal history of nicotine dependence: Secondary | ICD-10-CM | POA: Diagnosis not present

## 2023-02-18 DIAGNOSIS — E1122 Type 2 diabetes mellitus with diabetic chronic kidney disease: Secondary | ICD-10-CM | POA: Diagnosis not present

## 2023-02-18 DIAGNOSIS — I4510 Unspecified right bundle-branch block: Secondary | ICD-10-CM | POA: Diagnosis not present

## 2023-02-18 DIAGNOSIS — Z01818 Encounter for other preprocedural examination: Secondary | ICD-10-CM | POA: Insufficient documentation

## 2023-02-18 DIAGNOSIS — I129 Hypertensive chronic kidney disease with stage 1 through stage 4 chronic kidney disease, or unspecified chronic kidney disease: Secondary | ICD-10-CM | POA: Diagnosis not present

## 2023-02-18 DIAGNOSIS — N183 Chronic kidney disease, stage 3 unspecified: Secondary | ICD-10-CM | POA: Diagnosis not present

## 2023-02-18 DIAGNOSIS — E1169 Type 2 diabetes mellitus with other specified complication: Secondary | ICD-10-CM | POA: Insufficient documentation

## 2023-02-18 DIAGNOSIS — D494 Neoplasm of unspecified behavior of bladder: Secondary | ICD-10-CM | POA: Diagnosis not present

## 2023-02-18 DIAGNOSIS — Z951 Presence of aortocoronary bypass graft: Secondary | ICD-10-CM | POA: Insufficient documentation

## 2023-02-18 DIAGNOSIS — I2510 Atherosclerotic heart disease of native coronary artery without angina pectoris: Secondary | ICD-10-CM | POA: Diagnosis not present

## 2023-02-18 LAB — CBC
HCT: 41.8 % (ref 39.0–52.0)
Hemoglobin: 13.9 g/dL (ref 13.0–17.0)
MCH: 29.8 pg (ref 26.0–34.0)
MCHC: 33.3 g/dL (ref 30.0–36.0)
MCV: 89.7 fL (ref 80.0–100.0)
Platelets: 202 10*3/uL (ref 150–400)
RBC: 4.66 MIL/uL (ref 4.22–5.81)
RDW: 13 % (ref 11.5–15.5)
WBC: 11.2 10*3/uL — ABNORMAL HIGH (ref 4.0–10.5)
nRBC: 0 % (ref 0.0–0.2)

## 2023-02-18 LAB — HEMOGLOBIN A1C
Hgb A1c MFr Bld: 7.8 % — ABNORMAL HIGH (ref 4.8–5.6)
Mean Plasma Glucose: 177.16 mg/dL

## 2023-02-18 LAB — BASIC METABOLIC PANEL
Anion gap: 9 (ref 5–15)
BUN: 30 mg/dL — ABNORMAL HIGH (ref 8–23)
CO2: 22 mmol/L (ref 22–32)
Calcium: 9.6 mg/dL (ref 8.9–10.3)
Chloride: 108 mmol/L (ref 98–111)
Creatinine, Ser: 1.56 mg/dL — ABNORMAL HIGH (ref 0.61–1.24)
GFR, Estimated: 43 mL/min — ABNORMAL LOW (ref >60)
Glucose, Bld: 152 mg/dL — ABNORMAL HIGH (ref 70–99)
Potassium: 4.1 mmol/L (ref 3.5–5.1)
Sodium: 139 mmol/L (ref 135–145)

## 2023-02-18 LAB — GLUCOSE, CAPILLARY: Glucose-Capillary: 149 mg/dL — ABNORMAL HIGH (ref 70–99)

## 2023-02-21 ENCOUNTER — Encounter (HOSPITAL_COMMUNITY): Payer: Self-pay | Admitting: Physician Assistant

## 2023-02-21 ENCOUNTER — Encounter (HOSPITAL_COMMUNITY): Payer: Self-pay | Admitting: Anesthesiology

## 2023-02-21 NOTE — Progress Notes (Addendum)
 Anesthesia Chart Review   Case: [id number] Date/Time: 02/25/23 1300   Procedure: TRANSURETHRAL RESECTION OF BLADDER TUMOR (TURBT) with GEMCITABINE  AND BILATERAL RETROGRADE PYELOGRAM - 45 MINUTE CASE   Anesthesia type: General   Pre-op diagnosis: BLADDER TUMOR   Location: [facility] PROC[facility] / [facility]   Surgeons: Carolee Sherwood JONETTA DOUGLAS, MD       DISCUSSION:88 y.o. former smoker with h/o HTN, RBBB, CAD (CABG 1998), DM II, CKD Stage III, BPH, bladder tumor scheduled for above procedure 02/25/2023 with Dr. Sherwood Carolee.   Pt last seen by cardiology 05/31/2022. Per OV note pt to have repeat Echo in January of 2025.  This has not been completed.  Will request cardiac clearance and repeat Echo.   Echo 02/23/2023 with progression of aortic stenosis, now moderate to severe. Mean gradient 25.0 mmHg, valve area 0.83 cm2.   Spoke with cardiology, recommend case is postponed and pt seen in clinic for evaluation before proceeding with urology procedure.  VS: BP 118/73   Pulse 79   Temp 36.6 C (Oral)   Resp 16   Ht 5' 9 (1.753 m)   Wt 88.5 kg   SpO2 97%   BMI 28.80 kg/m   PROVIDERS: Plotnikov, Karlynn GAILS, MD is PCP   Cardiologist - [name], MD  LABS: Labs reviewed: Acceptable for surgery. (all labs ordered are listed, but only abnormal results are displayed)  Labs Reviewed  HEMOGLOBIN A1C - Abnormal; Notable for the following components:      Result Value   Hgb A1c MFr Bld 7.8 (*)    All other components within normal limits  BASIC METABOLIC PANEL - Abnormal; Notable for the following components:   Glucose, Bld 152 (*)    BUN 30 (*)    Creatinine, Ser 1.56 (*)    GFR, Estimated 43 (*)    All other components within normal limits  CBC - Abnormal; Notable for the following components:   WBC 11.2 (*)    All other components within normal limits  GLUCOSE, CAPILLARY - Abnormal; Notable for the following components:   Glucose-Capillary 149 (*)    All other components within normal  limits     IMAGES:   EKG:   CV: Echo 02/23/2023 1. Left ventricular ejection fraction, by estimation, is 50 to 55%. Left  ventricular ejection fraction by 3D volume is 50 %. The left ventricle has  low normal function. The left ventricle has no regional wall motion  abnormalities. Left ventricular  diastolic parameters are consistent with Grade I diastolic dysfunction  (impaired relaxation).   2. Right ventricular systolic function is normal. The right ventricular  size is normal.   3. The mitral valve is degenerative. Trivial mitral valve regurgitation.  No evidence of mitral stenosis.   4. The aortic valve is tricuspid. Aortic valve regurgitation is mild.  Moderate to severe aortic valve stenosis. Aortic regurgitation PHT  measures 485 msec. Aortic valve mean gradient measures 25.0 mmHg. Aortic  valve Vmax measures 3.25 m/s. Although the  mean AVG and Vmax are c/w moderate AS, the DVI is low at 0.26 and SVI low  at 33. Findings are consistent with low flow low gradient moderate to  severe AS.   5. The inferior vena cava is normal in size with greater than 50%  respiratory variability, suggesting right atrial pressure of 3 mmHg.   6. Compared to echo dated 02/02/2022, the mean AVG is essentially  unchanged but DVI has decreased from 0.35 to 0.26 and AVA  has decreased  from 1.2cm2 to 0.83cm2 (VTI).   Echo 02/02/2022 1. Left ventricular ejection fraction, by estimation, is 55%. The left  ventricle has low normal function. There is mild left ventricular  hypertrophy. Left ventricular diastolic parameters are indeterminate.   2. Right ventricular systolic function is normal. The right ventricular  size is normal. There is normal pulmonary artery systolic pressure.   3. The mitral valve is normal in structure. Mild mitral valve  regurgitation.   4. AV is thickened, calcified with restricted motion. Peak and mean  gradients through the valve are 45 and 23 mm Hg respectively AVA  (VTI) is  1.2 cm2 DVI is 0.35 consistent with moderate AS. SABRA The aortic valve is  tricuspid. Aortic valve regurgitation is   mild.   5. The inferior vena cava is normal in size with greater than 50%  respiratory variability, suggesting right atrial pressure of 3 mmHg.  Past Medical History:  Diagnosis Date   Benign localized prostatic hyperplasia with lower urinary tract symptoms (LUTS)    Bladder cancer [facility]) dx 05/ 2017--  urologist- dr chauncey   TCC , Ta of bladder  s/p TURBT 06-02-2015   CAD (coronary artery disease)    no cardiologist--  followed by pcp,  dr plontnikov   Chronic low back pain with sciatica    neuropathy feet   CKD (chronic kidney disease), stage III (HCC)    Diabetes mellitus, type 2 [facility])    ED (erectile dysfunction)    Full dentures    Heart murmur    History of ETT    08-09-2014   fair exercise tolerance, no chest pain, normal BP response, no ST changes (baseline RBBB);  negative adequate ETT   History of kidney stones    Hyperlipidemia    Hypertension    Hypogonadism male    OA (osteoarthritis)    Right bundle branch block (RBBB)    S/P CABG x 1    1998   Sigmoid diverticulosis    Ventral hernia    midline   Wears glasses     Past Surgical History:  Procedure Laterality Date   COLONOSCOPY WITH PROPOFOL   02-01-2012   CORONARY ARTERY BYPASS GRAFT  1998  at [facility]   x1  vessel    CYSTOSCOPY WITH BIOPSY N/A 10/08/2015   Procedure: CYSTOSCOPY WITH BLADDER BIOPSY;  Surgeon: Redell Lynwood Chauncey, MD;  Location: [facility];  Service: Urology;  Laterality: N/A;   CYSTOSCOPY WITH FULGERATION N/A 10/08/2015   Procedure: CYSTOSCOPY WITH FULGERATION;  Surgeon: Redell Lynwood Chauncey, MD;  Location: [facility];  Service: Urology;  Laterality: N/A;   CYSTOSCOPY WITH FULGERATION N/A 01/14/2022   Procedure: CYSTOSCOPY WITH FULGERATION;  Surgeon: Carolee Sherwood JONETTA DOUGLAS, MD;  Location[facility];  Service: Urology;  Laterality: N/A;    TONSILLECTOMY  age 65   TRANSURETHRAL RESECTION OF BLADDER TUMOR Bilateral 05/28/2022   Procedure: TRANSURETHRAL RESECTION OF BLADDER TUMOR (TURBT)  BILATERAL RETROGRADE PYELOGRAM;  Surgeon: Carolee Sherwood JONETTA DOUGLAS, MD;  Location: [facility];  Service: Urology;  Laterality: Bilateral;  1 HR FOR CASE   TRANSURETHRAL RESECTION OF BLADDER TUMOR WITH GYRUS (TURBT-GYRUS) N/A 06/02/2015   Procedure: TRANSURETHRAL RESECTION OF BLADDER TUMOR WITH GYRUS (TURBT-GYRUS);  Surgeon: Redell Lynwood Chauncey, MD;  Location: [facility];  Service: Urology;  Laterality: N/A;    MEDICATIONS:  amLODipine  (NORVASC ) 5 MG tablet   aspirin  81 MG tablet   cholecalciferol  (VITAMIN D ) 25 MCG (  1000 UT) tablet   CINNAMON PO   isosorbide  mononitrate (IMDUR ) 30 MG 24 hr tablet   metFORMIN  (GLUCOPHAGE -XR) 500 MG 24 hr tablet   repaglinide  (PRANDIN ) 1 MG tablet   telmisartan -hydrochlorothiazide  (MICARDIS  HCT) 80-25 MG tablet   TYLENOL  500 MG tablet   ZYPITAMAG  4 MG TABS   No current facility-administered medications for this encounter.     Harlene Hoots Ward, PA-C [facility] [phone]

## 2023-02-23 ENCOUNTER — Ambulatory Visit (HOSPITAL_COMMUNITY): Payer: Medicare Other | Attending: Internal Medicine

## 2023-02-23 DIAGNOSIS — I350 Nonrheumatic aortic (valve) stenosis: Secondary | ICD-10-CM | POA: Diagnosis not present

## 2023-02-23 LAB — ECHOCARDIOGRAM COMPLETE
AR max vel: 0.94 cm2
AV Area VTI: 0.83 cm2
AV Area mean vel: 0.9 cm2
AV Mean grad: 25 mm[Hg]
AV Peak grad: 42.3 mm[Hg]
Ao pk vel: 3.25 m/s
Area-P 1/2: 2.66 cm2
P 1/2 time: 485 ms
S' Lateral: 4 cm

## 2023-02-25 ENCOUNTER — Ambulatory Visit (HOSPITAL_COMMUNITY): Admission: RE | Admit: 2023-02-25 | Payer: Medicare Other | Source: Ambulatory Visit | Admitting: Urology

## 2023-02-25 ENCOUNTER — Encounter (HOSPITAL_COMMUNITY): Admission: RE | Payer: Self-pay | Source: Ambulatory Visit

## 2023-02-25 SURGERY — TRANSURETHRAL RESECTION OF BLADDER TUMOR (TURBT) with GEMCITABINE
Anesthesia: General

## 2023-02-27 ENCOUNTER — Other Ambulatory Visit: Payer: Self-pay | Admitting: Internal Medicine

## 2023-03-03 DIAGNOSIS — M5412 Radiculopathy, cervical region: Secondary | ICD-10-CM | POA: Diagnosis not present

## 2023-03-03 DIAGNOSIS — M4326 Fusion of spine, lumbar region: Secondary | ICD-10-CM | POA: Diagnosis not present

## 2023-03-04 ENCOUNTER — Telehealth: Payer: Self-pay | Admitting: Cardiovascular Disease

## 2023-03-04 ENCOUNTER — Other Ambulatory Visit: Payer: Self-pay | Admitting: Urology

## 2023-03-04 ENCOUNTER — Encounter: Payer: Self-pay | Admitting: Cardiovascular Disease

## 2023-03-04 ENCOUNTER — Other Ambulatory Visit: Payer: Self-pay | Admitting: Internal Medicine

## 2023-03-04 NOTE — Telephone Encounter (Signed)
 Patient has been scheduled for in person office visit

## 2023-03-04 NOTE — Telephone Encounter (Signed)
   Name: Jeff Burgess  DOB: 02/25/1935  MRN: [id number]  Primary Cardiologist: Verne Carrow, MD  Chart reviewed as part of pre-operative protocol coverage. Because of Jeff Burgess's past medical history and time since last visit, he will require a follow-up in-office visit in order to better assess preoperative cardiovascular risk.  Pre-op covering staff: - Please schedule appointment and call patient to inform them. If patient already had an upcoming appointment within acceptable timeframe, please add "pre-op clearance" to the appointment notes so provider is aware. - Please contact requesting surgeon's office via preferred method (i.e, phone, fax) to inform them of need for appointment prior to surgery.  Remote hx of CABG in 1998, okay to hold plavix x 5 day prior to surgery and resume when medically safe to do so. Will need in office visit due to progression of AS on recent echo.   Sharlene Dory, PA-C  03/04/2023, 3:04 PM

## 2023-03-04 NOTE — Telephone Encounter (Signed)
   Pre-operative Risk Assessment    Patient Name: Jeff Burgess  DOB: [birth year] MRN: [id number]      Request for Surgical Clearance    Procedure:  TRANSURETHRAL RESECTION OF BLADDER TUMOR (TURBT) with GEMCITABINE AND BILATERAL RETROGRADE PYELOGRAM   Date of Surgery:  Clearance 03/25/23                                 Surgeon:  Dr. Modena Slater  Surgeon's Group or Practice Name:  [facility]  Phone number:  [phone] Fax number:  [phone]   Type of Clearance Requested:   - Medical  - Pharmacy:  Hold Aspirin 5 day hold    Type of Anesthesia:  General    Additional requests/questions:    Signed, April Henson   03/04/2023, 2:49 PM

## 2023-03-04 NOTE — Telephone Encounter (Signed)
 Error

## 2023-03-07 ENCOUNTER — Other Ambulatory Visit: Payer: Self-pay | Admitting: Rehabilitation

## 2023-03-07 DIAGNOSIS — M5412 Radiculopathy, cervical region: Secondary | ICD-10-CM

## 2023-03-08 DIAGNOSIS — D0439 Carcinoma in situ of skin of other parts of face: Secondary | ICD-10-CM | POA: Diagnosis not present

## 2023-03-08 DIAGNOSIS — L905 Scar conditions and fibrosis of skin: Secondary | ICD-10-CM | POA: Diagnosis not present

## 2023-03-08 DIAGNOSIS — L570 Actinic keratosis: Secondary | ICD-10-CM | POA: Diagnosis not present

## 2023-03-17 NOTE — Patient Instructions (Addendum)
 SURGICAL WAITING ROOM VISITATION  Patients having surgery or a procedure may have no more than 2 support people in the waiting area - these visitors may rotate.    Children under the age of 76 must have an adult with them who is not the patient.  Due to an increase in RSV and influenza rates and associated hospitalizations, children ages 95 and under may not visit patients in [facility] hospitals.  Visitors with respiratory illnesses are discouraged from visiting and should remain at home.  If the patient needs to stay at the hospital during part of their recovery, the visitor guidelines for inpatient rooms apply. Pre-op nurse will coordinate an appropriate time for 1 support person to accompany patient in pre-op.  This support person may not rotate.    Please refer to the [facility] website for the visitor guidelines for Inpatients (after your surgery is over and you are in a regular room).       Your procedure is scheduled on: 03-25-23   Report to [facility] Main Entrance    Report to admitting at      11025   AM   Call this number if you have problems the morning of surgery [phone]   Do not eat food :After Midnight.   After Midnight you may have the following liquids until _0640 ____ AM/ DAY OF SURGERY  then nothing by mouth  Water Non-Citrus Juices (without pulp, NO RED-Apple, White grape, White cranberry) Black Coffee (NO MILK/CREAM OR CREAMERS, sugar ok)  Clear Tea (NO MILK/CREAM OR CREAMERS, sugar ok) regular and decaf                             Plain Jell-O (NO RED)                                           Fruit ices (not with fruit pulp, NO RED)                                     Popsicles (NO RED)                                                               Sports drinks like Gatorade (NO RED)                            If you have questions, please contact your surgeon's office.   FOLLOW ANY ADDITIONAL PRE OP INSTRUCTIONS YOU RECEIVED FROM  YOUR SURGEON'S OFFICE!!!     Oral Hygiene is also important to reduce your risk of infection.                                    Remember - BRUSH YOUR TEETH THE MORNING OF SURGERY WITH YOUR REGULAR TOOTHPASTE  DENTURES WILL BE REMOVED PRIOR TO SURGERY PLEASE DO NOT APPLY "Poly grip" OR ADHESIVES!!!   Do NOT smoke after Midnight  Stop all vitamins and herbal supplements 7 days before surgery.   Take these medicines the morning of surgery with A SIP OF WATER: Zypitamag, tylenol if needed, Imdur, amlodipine   DO NOT TAKE ANY ORAL DIABETIC MEDICATIONS DAY OF YOUR SURGERY  Bring CPAP mask and tubing day of surgery.                              You may not have any metal on your body including hair pins, jewelry, and body piercing             Do not wear , lotions, powders, /cologne, or deodorant              Men may shave face and neck.   Do not bring valuables to the hospital. [facility] IS NOT             RESPONSIBLE   FOR VALUABLES.   Contacts, glasses, dentures or bridgework may not be worn into surgery.   Bring small overnight bag day of surgery.   DO NOT BRING YOUR HOME MEDICATIONS TO THE HOSPITAL. PHARMACY WILL DISPENSE MEDICATIONS LISTED ON YOUR MEDICATION LIST TO YOU DURING YOUR ADMISSION IN THE HOSPITAL!    Patients discharged on the day of surgery will not be allowed to drive home.  Someone NEEDS to stay with you for the first 24 hours after anesthesia.   Special Instructions: Bring a copy of your healthcare power of attorney and living will documents the day of surgery if you haven't scanned them before.              Please read over the following fact sheets you were given: IF YOU HAVE QUESTIONS ABOUT YOUR PRE-OP INSTRUCTIONS PLEASE CALL [phone]   I If you test positive for Covid or have been in contact with anyone that has tested positive in the last 10 days please notify you surgeon.    [place] - Preparing for Surgery Before surgery, you can play an  important role.  Because skin is not sterile, your skin needs to be as free of germs as possible.  You can reduce the number of germs on your skin by washing with CHG (chlorahexidine gluconate) soap before surgery.  CHG is an antiseptic cleaner which kills germs and bonds with the skin to continue killing germs even after washing. Please DO NOT use if you have an allergy to CHG or antibacterial soaps.  If your skin becomes reddened/irritated stop using the CHG and inform your nurse when you arrive at Short Stay. Do not shave (including legs and underarms) for at least 48 hours prior to the first CHG shower.  You may shave your face/neck. Please follow these instructions carefully:  1.  Shower with CHG Soap the night before surgery and the  morning of Surgery.  2.  If you choose to wash your hair, wash your hair first as usual with your  normal  shampoo.  3.  After you shampoo, rinse your hair and body thoroughly to remove the  shampoo.                           4.  Use CHG as you would any other liquid soap.  You can apply chg directly  to the skin and wash  Gently with a scrungie or clean washcloth.  5.  Apply the CHG Soap to your body ONLY FROM THE NECK DOWN.   Do not use on face/ open                           Wound or open sores. Avoid contact with eyes, ears mouth and genitals (private parts).                       Wash face,  Genitals (private parts) with your normal soap.             6.  Wash thoroughly, paying special attention to the area where your surgery  will be performed.  7.  Thoroughly rinse your body with warm water from the neck down.  8.  DO NOT shower/wash with your normal soap after using and rinsing off  the CHG Soap.                9.  Pat yourself dry with a clean towel.            10.  Wear clean pajamas.            11.  Place clean sheets on your bed the night of your first shower and do not  sleep with pets. Day of Surgery : Do not apply any  lotions/deodorants the morning of surgery.  Please wear clean clothes to the hospital/surgery center.  FAILURE TO FOLLOW THESE INSTRUCTIONS MAY RESULT IN THE CANCELLATION OF YOUR SURGERY PATIENT SIGNATURE_________________________________  NURSE SIGNATURE__________________________________  ________________________________________________________________________

## 2023-03-17 NOTE — Progress Notes (Addendum)
 PCP - Jacinta Shoe, MD lov 12-20-22 epic Cardiologist - Verne Carrow, MD LOV 05-31-22 epic. Has appt. 03-21-23 for clearance  PPM/ICD -  Device Orders -  Rep Notified -   Chest x-ray - 02-01-22 epic EKG - 02-18-23 epic Stress Test -  ECHO - 02-23-23 epic Cardiac Cath -  HGbA1c- 02-18-23 epic 7.8  Sleep Study -  CPAP -   Fasting Blood Sugar -  Checks Blood Sugar __0___ times a day  Blood Thinner Instructions: Aspirin Instructions: 81 mg stop 5 days prior  ERAS Protcol - PRE-SURGERY n/a    COVID vaccine -yes  Activity--Able to complete Adl's without CP or SOB  Anesthesia review: Moderate-severe  AS per echo 02-23-23 , CAD, CABG 1998, HTN, murmur, DM2, CKD stage 3  Patient denies shortness of breath, fever, cough and chest pain at PAT appointment   All instructions explained to the patient, with a verbal understanding of the material. Patient agrees to go over the instructions while at home for a better understanding. Patient also instructed to self quarantine after being tested for COVID-19. The opportunity to ask questions was provided.

## 2023-03-18 ENCOUNTER — Other Ambulatory Visit: Payer: Self-pay

## 2023-03-18 ENCOUNTER — Encounter (HOSPITAL_COMMUNITY)
Admission: RE | Admit: 2023-03-18 | Discharge: 2023-03-18 | Disposition: A | Discharge status: Home / Self Care | Payer: Medicare Other | Source: Ambulatory Visit | Attending: Urology | Admitting: Urology

## 2023-03-18 ENCOUNTER — Encounter (HOSPITAL_COMMUNITY): Payer: Self-pay

## 2023-03-18 VITALS — BP 136/71 | HR 72 | Temp 98.3°F | Resp 16 | Ht 69.0 in | Wt 202.0 lb

## 2023-03-18 DIAGNOSIS — Z01812 Encounter for preprocedural laboratory examination: Secondary | ICD-10-CM | POA: Diagnosis not present

## 2023-03-18 DIAGNOSIS — Z01818 Encounter for other preprocedural examination: Secondary | ICD-10-CM | POA: Diagnosis present

## 2023-03-18 DIAGNOSIS — E1169 Type 2 diabetes mellitus with other specified complication: Secondary | ICD-10-CM | POA: Insufficient documentation

## 2023-03-18 LAB — CBC
HCT: 40.3 % (ref 39.0–52.0)
Hemoglobin: 13.1 g/dL (ref 13.0–17.0)
MCH: 29.8 pg (ref 26.0–34.0)
MCHC: 32.5 g/dL (ref 30.0–36.0)
MCV: 91.8 fL (ref 80.0–100.0)
Platelets: 212 10*3/uL (ref 150–400)
RBC: 4.39 MIL/uL (ref 4.22–5.81)
RDW: 13.4 % (ref 11.5–15.5)
WBC: 9.8 10*3/uL (ref 4.0–10.5)
nRBC: 0 % (ref 0.0–0.2)

## 2023-03-18 LAB — BASIC METABOLIC PANEL
Anion gap: 10 (ref 5–15)
BUN: 29 mg/dL — ABNORMAL HIGH (ref 8–23)
CO2: 20 mmol/L — ABNORMAL LOW (ref 22–32)
Calcium: 9.1 mg/dL (ref 8.9–10.3)
Chloride: 110 mmol/L (ref 98–111)
Creatinine, Ser: 1.46 mg/dL — ABNORMAL HIGH (ref 0.61–1.24)
GFR, Estimated: 47 mL/min — ABNORMAL LOW (ref >60)
Glucose, Bld: 150 mg/dL — ABNORMAL HIGH (ref 70–99)
Potassium: 4.2 mmol/L (ref 3.5–5.1)
Sodium: 140 mmol/L (ref 135–145)

## 2023-03-18 LAB — GLUCOSE, CAPILLARY: Glucose-Capillary: 173 mg/dL — ABNORMAL HIGH (ref 70–99)

## 2023-03-21 ENCOUNTER — Other Ambulatory Visit: Payer: Self-pay

## 2023-03-21 ENCOUNTER — Ambulatory Visit: Payer: Medicare Other | Admitting: Internal Medicine

## 2023-03-21 ENCOUNTER — Ambulatory Visit: Payer: Medicare Other | Attending: Physician Assistant | Admitting: Physician Assistant

## 2023-03-21 ENCOUNTER — Encounter: Payer: Self-pay | Admitting: Physician Assistant

## 2023-03-21 VITALS — BP 144/70 | HR 83 | Ht 69.0 in | Wt 205.0 lb

## 2023-03-21 DIAGNOSIS — I739 Peripheral vascular disease, unspecified: Secondary | ICD-10-CM

## 2023-03-21 DIAGNOSIS — I350 Nonrheumatic aortic (valve) stenosis: Secondary | ICD-10-CM

## 2023-03-21 DIAGNOSIS — I351 Nonrheumatic aortic (valve) insufficiency: Secondary | ICD-10-CM

## 2023-03-21 DIAGNOSIS — I10 Essential (primary) hypertension: Secondary | ICD-10-CM | POA: Diagnosis not present

## 2023-03-21 DIAGNOSIS — I2510 Atherosclerotic heart disease of native coronary artery without angina pectoris: Secondary | ICD-10-CM | POA: Diagnosis not present

## 2023-03-21 DIAGNOSIS — I6523 Occlusion and stenosis of bilateral carotid arteries: Secondary | ICD-10-CM

## 2023-03-21 DIAGNOSIS — E785 Hyperlipidemia, unspecified: Secondary | ICD-10-CM | POA: Diagnosis not present

## 2023-03-21 DIAGNOSIS — R079 Chest pain, unspecified: Secondary | ICD-10-CM

## 2023-03-21 MED ORDER — FUROSEMIDE 20 MG PO TABS: ORAL_TABLET | ORAL | 3 refills | Status: DC

## 2023-03-21 MED ORDER — FUROSEMIDE 20 MG PO TABS: 20.0000 mg | ORAL_TABLET | Freq: Every day | ORAL | 3 refills | Status: DC | PRN

## 2023-03-21 NOTE — Patient Instructions (Signed)
 Medication Instructions:  Lasix 20 mg as needed for leg swelling *If you need a refill on your cardiac medications before your next appointment, please call your pharmacy*   Testing/Procedures: Lexiscan Myoview Stress Test   Follow-Up: At [facility], you and your health needs are our priority.  As part of our continuing mission to provide you with exceptional heart care, we have created designated Provider Care Teams.  These Care Teams include your primary Cardiologist (physician) and Advanced Practice Providers (APPs -  Physician Assistants and Nurse Practitioners) who all work together to provide you with the care you need, when you need it.  We recommend signing up for the patient portal called "MyChart".  Sign up information is provided on this After Visit Summary.  MyChart is used to connect with patients for Virtual Visits (Telemedicine).  Patients are able to view lab/test results, encounter notes, upcoming appointments, etc.  Non-urgent messages can be sent to your provider as well.   To learn more about what you can do with MyChart, go to [email or web address].    Your next appointment:   As scheduled  Provider:   Clifton James, MD  Other Instructions   1st Floor: - Lobby - Registration  - Pharmacy  - Lab - Cafe  2nd Floor: - PV Lab - Diagnostic Testing (echo, CT, nuclear med)  3rd Floor: - Vacant  4th Floor: - TCTS (cardiothoracic surgery) - AFib Clinic - Structural Heart Clinic - Vascular Surgery  - Vascular Ultrasound  5th Floor: - HeartCare Cardiology (general and EP) - Clinical Pharmacy for coumadin, hypertension, lipid, weight-loss medications, and med management appointments    Valet parking services will be available as well.

## 2023-03-21 NOTE — Progress Notes (Signed)
 Cardiology Office Note:  .   Date:  03/21/2023  ID:  Jeff Burgess, DOB 1935/04/09, MRN [id number] PCP: Tresa Garter, MD  [facility] Providers Cardiologist:  Verne Carrow, MD {  History of Present Illness: .   Jeff Burgess is a 88 y.o. male with a past medical history of CAD status post one-vessel CABG in 1998, carotid artery disease, PAD, HTN, HLD, CKD stage III, DM, bladder cancer and chronic RBBB who is here for follow-up appointment and preop clearance.  History includes admitted to [facility] January 2024 with chest pain, mild troponin elevation.  Started on heparin but developed hematuria so no cardiac cath was performed.  Was not started on Plavix due to bleeding risk MIs not start a beta-blocker due to prior intolerance.  Echo 02/02/2022 with LVEF to 5%.  Mild MR.  Moderate aortic stenosis with mean gradient 23 mmHg gradient 23 mmHg, AVA 1.2 cm.  Seen in our office 02/18/2022 and was doing well at that time.  Was seen May 2024 for follow-up.  Patient had any chest pain, dyspnea, palpitations, lower extremity edema, orthopnea, PND, dizziness, syncope or near syncope.  Today, presents for preop clearance. The patient, with a history of heart murmur and PVCs, presents for preoperative clearance for a bladder biopsy. He reports occasional chest tightness, particularly after meals and exertion, which resolves with rest. The patient describes this sensation as similar to indigestion and localized to the left side of the chest. The patient also reports decreased physical activity over the past year due to a diagnosis of bladder cancer. The patient notes swelling in both legs, more pronounced on the left, and a sensation of numbness behind the left knee. The patient denies any shortness of breath, dizziness, or lightheadedness  Reports no shortness of breath nor dyspnea on exertion. . No edema, orthopnea, PND. Reports no palpitations.   Discussed the use of AI scribe  software for clinical note transcription with the patient, who gave verbal consent to proceed.  ROS: Pertinent ROS in HPI  Studies Reviewed: .        Echo January 2024: 1. Left ventricular ejection fraction, by estimation, is 55%. The left  ventricle has low normal function. There is mild left ventricular  hypertrophy. Left ventricular diastolic parameters are indeterminate.   2. Right ventricular systolic function is normal. The right ventricular  size is normal. There is normal pulmonary artery systolic pressure.   3. The mitral valve is normal in structure. Mild mitral valve  regurgitation.   4. AV is thickened, calcified with restricted motion. Peak and mean  gradients through the valve are 45 and 23 mm Hg respectively AVA (VTI) is  1.2 cm2 DVI is 0.35 consistent with moderate AS. [name] The aortic valve is  tricuspid. Aortic valve regurgitation is   mild.   5. The inferior vena cava is normal in size with greater than 50%  respiratory variability, suggesting right atrial pressure of 3 mmHg.         Physical Exam:   VS:  BP (!) 144/70   Pulse 83   Ht 5\' 9"  (1.753 m)   Wt 205 lb (93 kg)   SpO2 97%   BMI 30.27 kg/m    Wt Readings from Last 3 Encounters:  03/21/23 205 lb (93 kg)  03/18/23 202 lb (91.6 kg)  02/18/23 195 lb (88.5 kg)    GEN: Well nourished, well developed in no acute distress NECK: No JVD; No carotid bruits CARDIAC: RRR,  no murmurs, rubs, gallops RESPIRATORY:  Clear to auscultation without rales, wheezing or rhonchi  ABDOMEN: Soft, non-tender, non-distended EXTREMITIES:  No edema; No deformity   ASSESSMENT AND PLAN: .   Preop clearance  Jeff Burgess's perioperative risk of a major cardiac event is 0.9% according to the Revised Cardiac Risk Index (RCRI).  Therefore, he is at low risk for perioperative complications.   His functional capacity is fair at 4.73 METs according to the [employer] Activity Status Index (DASI). Recommendations: The patient requires a  Lexiscan Myoview for chest pain prior to clearance (scheduled on 3/12) With severe AS will also need Dr. Aundra Dubin to weigh in on clearance.   Chest Pain Intermittent post-exertional chest pain, likely cardiac. Stress test needed for cardiac evaluation and surgical clearance. - Order chemical stress test before bladder biopsy on March 25, 2023.  Severe Aortic Stenosis Severe aortic stenosis requires evaluation by structural heart specialist. TAVR considered if symptomatic. - Consult with Dr. Camillo Flaming for aortic stenosis evaluation and surgical clearance. - Maintain structural heart clinic appointment in May.  Premature Ventricular Contractions (PVCs) EKG showed PVCs, asymptomatic and not concerning unless frequent.  Peripheral Edema Bilateral leg swelling, worsening. On Micardis with HCTZ, consider additional diuretic therapy. - Prescribe Lasix 20 mg as needed for swelling. - Advise against taking Lasix immediately before bladder biopsy.  Back Pain with Suspected Neuropathy Chronic back pain with suspected neuropathy, possible nerve compression from arthritis. - Consider further evaluation for neuropathy and back pain management.     Dispo: He has an appointment with structural in May  Signed, Sharlene Dory, [place]

## 2023-03-22 ENCOUNTER — Telehealth (HOSPITAL_COMMUNITY): Payer: Self-pay | Admitting: *Deleted

## 2023-03-22 ENCOUNTER — Encounter (HOSPITAL_COMMUNITY): Payer: Self-pay

## 2023-03-22 NOTE — Telephone Encounter (Signed)
 Patient given detailed instructions per Myocardial Perfusion Study Information Sheet for the test on 03/23/23 Patient notified to arrive 15 minutes early and that it is imperative to arrive on time for appointment to keep from having the test rescheduled.  If you need to cancel or reschedule your appointment, please call the office within 24 hours of your appointment. . Patient verbalized understanding. Jeff Burgess

## 2023-03-23 ENCOUNTER — Ambulatory Visit (HOSPITAL_COMMUNITY): Attending: Physician Assistant

## 2023-03-23 DIAGNOSIS — R079 Chest pain, unspecified: Secondary | ICD-10-CM | POA: Diagnosis not present

## 2023-03-23 LAB — MYOCARDIAL PERFUSION IMAGING
LV dias vol: 131 mL (ref 62–150)
LV sys vol: 69 mL
Nuc Stress EF: 47 %
Peak HR: 90 {beats}/min
Rest HR: 71 {beats}/min
Rest Nuclear Isotope Dose: 10 mCi
SDS: 5
SRS: 0
SSS: 5
ST Depression (mm): 0 mm
Stress Nuclear Isotope Dose: 32.7 mCi
TID: 1.07

## 2023-03-23 MED ORDER — TECHNETIUM TC 99M TETROFOSMIN IV KIT
10.0000 | PACK | Freq: Once | INTRAVENOUS | Status: AC | PRN
  Administered 2023-03-23: 10 via INTRAVENOUS

## 2023-03-23 MED ORDER — REGADENOSON 0.4 MG/5ML IV SOLN
0.4000 mg | Freq: Once | INTRAVENOUS | Status: AC
  Administered 2023-03-23: 0.4 mg via INTRAVENOUS

## 2023-03-23 MED ORDER — TECHNETIUM TC 99M TETROFOSMIN IV KIT
32.7000 | PACK | Freq: Once | INTRAVENOUS | Status: AC | PRN
Start: 2023-03-23 — End: 2023-03-23
  Administered 2023-03-23: 32.7 via INTRAVENOUS

## 2023-03-24 ENCOUNTER — Encounter: Payer: Self-pay | Admitting: Physician Assistant

## 2023-03-24 NOTE — Anesthesia Preprocedure Evaluation (Signed)
 Anesthesia Evaluation  Patient identified by MRN, date of birth, ID band Patient awake    Reviewed: Allergy & Precautions, NPO status , Patient's Chart, lab work & pertinent test results  Airway Mallampati: IV  TM Distance: >3 FB Neck ROM: Full    Dental  (+) Dental Advisory Given, Lower Dentures, Upper Dentures   Pulmonary former smoker   Pulmonary exam normal breath sounds clear to auscultation       Cardiovascular hypertension, Pt. on medications (-) angina + CAD and + Past MI  + dysrhythmias (RBBB) + Valvular Problems/Murmurs AS  Rhythm:Regular Rate:Normal + Systolic murmurs Echo 02/23/23: 1. Left ventricular ejection fraction, by estimation, is 50 to 55%. Left  ventricular ejection fraction by 3D volume is 50 %. The left ventricle has  low normal function. The left ventricle has no regional wall motion  abnormalities. Left ventricular  diastolic parameters are consistent with Grade I diastolic dysfunction  (impaired relaxation).   2. Right ventricular systolic function is normal. The right ventricular  size is normal.   3. The mitral valve is degenerative. Trivial mitral valve regurgitation.  No evidence of mitral stenosis.   4. The aortic valve is tricuspid. Aortic valve regurgitation is mild.  Moderate to severe aortic valve stenosis. Aortic regurgitation PHT  measures 485 msec. Aortic valve mean gradient measures 25.0 mmHg. Aortic  valve Vmax measures 3.25 m/s. Although the  mean AVG and Vmax are c/w moderate AS, the DVI is low at 0.26 and SVI low  at 33. Findings are consistent with low flow low gradient moderate to  severe AS.   5. The inferior vena cava is normal in size with greater than 50%  respiratory variability, suggesting right atrial pressure of 3 mmHg.   6. Compared to echo dated 02/02/2022, the mean AVG is essentially  unchanged but DVI has decreased from 0.35 to 0.26 and AVA has decreased  from 1.2cm2 to  0.83cm2 (VTI).      Neuro/Psych negative neurological ROS     GI/Hepatic negative GI ROS, Neg liver ROS,,,  Endo/Other  diabetes, Type 2, Oral Hypoglycemic Agents    Renal/GU Renal InsufficiencyRenal disease     Musculoskeletal  (+) Arthritis ,    Abdominal   Peds  Hematology negative hematology ROS (+)   Anesthesia Other Findings Day of surgery medications reviewed with the patient.  Reproductive/Obstetrics                             Anesthesia Physical Anesthesia Plan  ASA: 4  Anesthesia Plan: General   Post-op Pain Management: Tylenol PO (pre-op)*   Induction: Intravenous  PONV Risk Score and Plan: 3 and Dexamethasone and Ondansetron  Airway Management Planned: LMA  Additional Equipment:   Intra-op Plan:   Post-operative Plan: Extubation in OR  Informed Consent: I have reviewed the patients History and Physical, chart, labs and discussed the procedure including the risks, benefits and alternatives for the proposed anesthesia with the patient or authorized representative who has indicated his/her understanding and acceptance.     Dental advisory given  Plan Discussed with: CRNA  Anesthesia Plan Comments: (See PAT note 03/18/2023)       Anesthesia Quick Evaluation

## 2023-03-24 NOTE — Progress Notes (Signed)
 Anesthesia Chart Review   Case: [id number] Date/Time: 03/25/23 1225   Procedure: TURBT, WITH CHEMOTHERAPEUTIC AGENT INSTILLATION INTO BLADDER - 45 MINUTE CASE   Anesthesia type: General   Pre-op diagnosis: BLADDER TUMOR   Location: [facility]   Surgeons: Crista Elliot, MD       DISCUSSION:88 y.o. former smoker with h/o HTN, DM II, CKD Stage III, CAD s/p CABG 1998, severe AS, RBBB, bladder tumor scheduled fora bove procedure 03/25/2023 with Dr. Modena Slater.   Pt seen by cardiology 03/21/2023. Per OV note, "Jeff Burgess's perioperative risk of a major cardiac event is 0.9% according to the Revised Cardiac Risk Index (RCRI).  Therefore, he is at low risk for perioperative complications.   His functional capacity is fair at 4.73 METs according to the Duke Activity Status Index (DASI). Recommendations: The patient requires a Lexiscan Myoview for chest pain prior to clearance (scheduled on 3/12) With severe AS will also need Dr. Aundra Dubin to weigh in on clearance."  Stress test 03/23/2023 low risk study.   Echo 02/23/23 with progression of AS, now moderate to severe aortic stenosis, mean gradient 25.0 mmHg, valve area 0.83 cm2.   Discussed with Dr. Clifton James, pt ok to proceed with surgery.  VS: BP 136/71   Pulse 72   Temp 36.8 C (Oral)   Resp 16   Ht 5\' 9"  (1.753 m)   Wt 91.6 kg   SpO2 98%   BMI 29.83 kg/m   PROVIDERS: Plotnikov, Georgina Quint, MD is PCP   Cardiologist:  Verne Carrow, MD {  LABS: Labs reviewed: Acceptable for surgery. (all labs ordered are listed, but only abnormal results are displayed)  Labs Reviewed  BASIC METABOLIC PANEL - Abnormal; Notable for the following components:      Result Value   CO2 20 (*)    Glucose, Bld 150 (*)    BUN 29 (*)    Creatinine, Ser 1.46 (*)    GFR, Estimated 47 (*)    All other components within normal limits  GLUCOSE, CAPILLARY - Abnormal; Notable for the following components:   Glucose-Capillary 173 (*)     All other components within normal limits  CBC     IMAGES:   EKG:   CV: Myocardial Perfusion 03/23/2023   LV perfusion is normal. There is no evidence of ischemia. There is no evidence of infarction. Mildly reduced inferior counts consistent with diaphragm attenuation.   Left ventricular function is normal. Nuclear stress EF: 47%. The left ventricular ejection fraction is mildly decreased (45-54%). End diastolic cavity size is normal.   The study is normal. The study is low risk.  Echo 02/23/23 1. Left ventricular ejection fraction, by estimation, is 50 to 55%. Left  ventricular ejection fraction by 3D volume is 50 %. The left ventricle has  low normal function. The left ventricle has no regional wall motion  abnormalities. Left ventricular  diastolic parameters are consistent with Grade I diastolic dysfunction  (impaired relaxation).   2. Right ventricular systolic function is normal. The right ventricular  size is normal.   3. The mitral valve is degenerative. Trivial mitral valve regurgitation.  No evidence of mitral stenosis.   4. The aortic valve is tricuspid. Aortic valve regurgitation is mild.  Moderate to severe aortic valve stenosis. Aortic regurgitation PHT  measures 485 msec. Aortic valve mean gradient measures 25.0 mmHg. Aortic  valve Vmax measures 3.25 m/s. Although the  mean AVG and Vmax are c/w moderate AS, the  DVI is low at 0.26 and SVI low  at 33. Findings are consistent with low flow low gradient moderate to  severe AS.   5. The inferior vena cava is normal in size with greater than 50%  respiratory variability, suggesting right atrial pressure of 3 mmHg.   6. Compared to echo dated 02/02/2022, the mean AVG is essentially  unchanged but DVI has decreased from 0.35 to 0.26 and AVA has decreased  from 1.2cm2 to 0.83cm2 (VTI).  Past Medical History:  Diagnosis Date   Benign localized prostatic hyperplasia with lower urinary tract symptoms (LUTS)    Bladder  cancer [facility]) dx 05/ 2017--  urologist- dr Sherryl Barters   TCC , Ta of bladder  s/p TURBT 06-02-2015   CAD (coronary artery disease)    no cardiologist--  followed by pcp,  dr plontnikov   Chronic low back pain with sciatica    neuropathy feet   CKD (chronic kidney disease), stage III (HCC)    Diabetes mellitus, type 2 [facility])    ED (erectile dysfunction)    Full dentures    Heart murmur    History of ETT    08-09-2014   fair exercise tolerance, no chest pain, normal BP response, no ST changes (baseline RBBB);  negative adequate ETT   History of kidney stones    Hyperlipidemia    Hypertension    Hypogonadism male    OA (osteoarthritis)    Right bundle branch block (RBBB)    S/P CABG x 1    1998   Sigmoid diverticulosis    Ventral hernia    midline   Wears glasses     Past Surgical History:  Procedure Laterality Date   COLONOSCOPY WITH PROPOFOL  02-01-2012   CORONARY ARTERY BYPASS GRAFT  1998  at [facility]   x1  vessel    CYSTOSCOPY WITH BIOPSY N/A 10/08/2015   Procedure: CYSTOSCOPY WITH BLADDER BIOPSY;  Surgeon: Hildred Laser, MD;  Location: [facility];  Service: Urology;  Laterality: N/A;   CYSTOSCOPY WITH FULGERATION N/A 10/08/2015   Procedure: CYSTOSCOPY WITH FULGERATION;  Surgeon: Hildred Laser, MD;  Location[facility];  Service: Urology;  Laterality: N/A;   CYSTOSCOPY WITH FULGERATION N/A 01/14/2022   Procedure: CYSTOSCOPY WITH FULGERATION;  Surgeon: Crista Elliot, MD;  Location: [facility];  Service: Urology;  Laterality: N/A;   TONSILLECTOMY  age 52   TRANSURETHRAL RESECTION OF BLADDER TUMOR Bilateral 05/28/2022   Procedure: TRANSURETHRAL RESECTION OF BLADDER TUMOR (TURBT)  BILATERAL RETROGRADE PYELOGRAM;  Surgeon: Crista Elliot, MD;  Location: [facility];  Service: Urology;  Laterality: Bilateral;  1 HR FOR CASE   TRANSURETHRAL RESECTION OF BLADDER TUMOR WITH GYRUS (TURBT-GYRUS) N/A 06/02/2015   Procedure: TRANSURETHRAL  RESECTION OF BLADDER TUMOR WITH GYRUS (TURBT-GYRUS);  Surgeon: Hildred Laser, MD;  Location: [facility];  Service: Urology;  Laterality: N/A;    MEDICATIONS:  amLODipine (NORVASC) 5 MG tablet   aspirin 81 MG tablet   cholecalciferol (VITAMIN D) 25 MCG (1000 UT) tablet   CINNAMON PO   furosemide (LASIX) 20 MG tablet   gabapentin (NEURONTIN) 300 MG capsule   isosorbide mononitrate (IMDUR) 30 MG 24 hr tablet   metFORMIN (GLUCOPHAGE-XR) 500 MG 24 hr tablet   repaglinide (PRANDIN) 1 MG tablet   telmisartan-hydrochlorothiazide (MICARDIS HCT) 80-25 MG tablet   TYLENOL 500 MG tablet   ZYPITAMAG 4 MG TABS   No current facility-administered medications for this  encounter.     Jodell Cipro Ward, PA-C [facility] [phone]

## 2023-03-25 ENCOUNTER — Ambulatory Visit (HOSPITAL_COMMUNITY)

## 2023-03-25 ENCOUNTER — Ambulatory Visit (HOSPITAL_BASED_OUTPATIENT_CLINIC_OR_DEPARTMENT_OTHER): Admitting: Anesthesiology

## 2023-03-25 ENCOUNTER — Encounter (HOSPITAL_COMMUNITY): Payer: Self-pay | Admitting: Urology

## 2023-03-25 ENCOUNTER — Ambulatory Visit (HOSPITAL_COMMUNITY)
Admission: RE | Admit: 2023-03-25 | Discharge: 2023-03-25 | Disposition: A | Discharge status: Home / Self Care | Payer: Medicare Other | Source: Ambulatory Visit | Attending: Urology | Admitting: Urology

## 2023-03-25 ENCOUNTER — Other Ambulatory Visit: Payer: Self-pay

## 2023-03-25 ENCOUNTER — Encounter (HOSPITAL_COMMUNITY)
Admission: RE | Disposition: A | Discharge status: Home / Self Care | Payer: Self-pay | Source: Ambulatory Visit | Attending: Urology

## 2023-03-25 ENCOUNTER — Ambulatory Visit (HOSPITAL_COMMUNITY): Admitting: Physician Assistant

## 2023-03-25 DIAGNOSIS — I2510 Atherosclerotic heart disease of native coronary artery without angina pectoris: Secondary | ICD-10-CM | POA: Diagnosis not present

## 2023-03-25 DIAGNOSIS — E119 Type 2 diabetes mellitus without complications: Secondary | ICD-10-CM | POA: Insufficient documentation

## 2023-03-25 DIAGNOSIS — E1169 Type 2 diabetes mellitus with other specified complication: Secondary | ICD-10-CM

## 2023-03-25 DIAGNOSIS — I4510 Unspecified right bundle-branch block: Secondary | ICD-10-CM | POA: Diagnosis not present

## 2023-03-25 DIAGNOSIS — N183 Chronic kidney disease, stage 3 unspecified: Secondary | ICD-10-CM | POA: Diagnosis not present

## 2023-03-25 DIAGNOSIS — C679 Malignant neoplasm of bladder, unspecified: Secondary | ICD-10-CM | POA: Insufficient documentation

## 2023-03-25 DIAGNOSIS — I10 Essential (primary) hypertension: Secondary | ICD-10-CM | POA: Insufficient documentation

## 2023-03-25 DIAGNOSIS — Z87891 Personal history of nicotine dependence: Secondary | ICD-10-CM | POA: Diagnosis not present

## 2023-03-25 DIAGNOSIS — I350 Nonrheumatic aortic (valve) stenosis: Secondary | ICD-10-CM | POA: Diagnosis not present

## 2023-03-25 DIAGNOSIS — I252 Old myocardial infarction: Secondary | ICD-10-CM | POA: Insufficient documentation

## 2023-03-25 DIAGNOSIS — E1122 Type 2 diabetes mellitus with diabetic chronic kidney disease: Secondary | ICD-10-CM | POA: Diagnosis not present

## 2023-03-25 DIAGNOSIS — D494 Neoplasm of unspecified behavior of bladder: Secondary | ICD-10-CM | POA: Diagnosis not present

## 2023-03-25 DIAGNOSIS — D090 Carcinoma in situ of bladder: Secondary | ICD-10-CM | POA: Diagnosis not present

## 2023-03-25 DIAGNOSIS — Z7984 Long term (current) use of oral hypoglycemic drugs: Secondary | ICD-10-CM | POA: Insufficient documentation

## 2023-03-25 DIAGNOSIS — I129 Hypertensive chronic kidney disease with stage 1 through stage 4 chronic kidney disease, or unspecified chronic kidney disease: Secondary | ICD-10-CM | POA: Diagnosis not present

## 2023-03-25 LAB — GLUCOSE, CAPILLARY
Glucose-Capillary: 173 mg/dL — ABNORMAL HIGH (ref 70–99)
Glucose-Capillary: 116 mg/dL — ABNORMAL HIGH (ref 70–99)

## 2023-03-25 SURGERY — TURBT, WITH CHEMOTHERAPEUTIC AGENT INSTILLATION INTO BLADDER
Anesthesia: General | Site: Bladder

## 2023-03-25 MED ORDER — LIDOCAINE HCL (PF) 2 % IJ SOLN
INTRAMUSCULAR | Status: AC
  Filled 2023-03-25: qty 5

## 2023-03-25 MED ORDER — ACETAMINOPHEN 500 MG PO TABS
1000.0000 mg | ORAL_TABLET | Freq: Once | ORAL | Status: AC
  Administered 2023-03-25: 1000 mg via ORAL
  Filled 2023-03-25: qty 2

## 2023-03-25 MED ORDER — ROCURONIUM BROMIDE 10 MG/ML (PF) SYRINGE
PREFILLED_SYRINGE | INTRAVENOUS | Status: AC
  Filled 2023-03-25: qty 10

## 2023-03-25 MED ORDER — DEXAMETHASONE SODIUM PHOSPHATE 10 MG/ML IJ SOLN
INTRAMUSCULAR | Status: AC
  Filled 2023-03-25: qty 1

## 2023-03-25 MED ORDER — MIDAZOLAM HCL 2 MG/2ML IJ SOLN
INTRAMUSCULAR | Status: AC
  Filled 2023-03-25: qty 2

## 2023-03-25 MED ORDER — PHENYLEPHRINE HCL-NACL 20-0.9 MG/250ML-% IV SOLN
INTRAVENOUS | Status: DC | PRN
  Administered 2023-03-25: 40 ug/min via INTRAVENOUS

## 2023-03-25 MED ORDER — DEXAMETHASONE SODIUM PHOSPHATE 10 MG/ML IJ SOLN
INTRAMUSCULAR | Status: DC | PRN
  Administered 2023-03-25: 10 mg via INTRAVENOUS

## 2023-03-25 MED ORDER — ACETAMINOPHEN 10 MG/ML IV SOLN: 1000.0000 mg | Freq: Once | INTRAVENOUS | Status: DC | PRN

## 2023-03-25 MED ORDER — INSULIN ASPART 100 UNIT/ML IJ SOLN: 0.0000 [IU] | INTRAMUSCULAR | Status: DC | PRN

## 2023-03-25 MED ORDER — FENTANYL CITRATE (PF) 100 MCG/2ML IJ SOLN
INTRAMUSCULAR | Status: AC
  Filled 2023-03-25: qty 2

## 2023-03-25 MED ORDER — SUGAMMADEX SODIUM 200 MG/2ML IV SOLN
INTRAVENOUS | Status: DC | PRN
  Administered 2023-03-25: 200 mg via INTRAVENOUS

## 2023-03-25 MED ORDER — ONDANSETRON HCL 4 MG/2ML IJ SOLN: 4.0000 mg | Freq: Once | INTRAMUSCULAR | Status: DC | PRN

## 2023-03-25 MED ORDER — LACTATED RINGERS IV SOLN: INTRAVENOUS | Status: DC

## 2023-03-25 MED ORDER — CHLORHEXIDINE GLUCONATE 0.12 % MT SOLN
15.0000 mL | Freq: Once | OROMUCOSAL | Status: AC
  Administered 2023-03-25: 15 mL via OROMUCOSAL

## 2023-03-25 MED ORDER — ORAL CARE MOUTH RINSE: 15.0000 mL | Freq: Once | OROMUCOSAL | Status: AC

## 2023-03-25 MED ORDER — ONDANSETRON HCL 4 MG/2ML IJ SOLN
INTRAMUSCULAR | Status: DC | PRN
  Administered 2023-03-25: 4 mg via INTRAVENOUS

## 2023-03-25 MED ORDER — FENTANYL CITRATE (PF) 100 MCG/2ML IJ SOLN
INTRAMUSCULAR | Status: DC | PRN
  Administered 2023-03-25 (×2): 50 ug via INTRAVENOUS

## 2023-03-25 MED ORDER — PROPOFOL 10 MG/ML IV BOLUS
INTRAVENOUS | Status: DC | PRN
  Administered 2023-03-25: 100 mg via INTRAVENOUS

## 2023-03-25 MED ORDER — FENTANYL CITRATE PF 50 MCG/ML IJ SOSY: 25.0000 ug | PREFILLED_SYRINGE | INTRAMUSCULAR | Status: DC | PRN

## 2023-03-25 MED ORDER — PROPOFOL 10 MG/ML IV BOLUS
INTRAVENOUS | Status: AC
  Filled 2023-03-25: qty 20

## 2023-03-25 MED ORDER — GEMCITABINE CHEMO FOR BLADDER INSTILLATION 2000 MG
2000.0000 mg | Freq: Once | INTRAVENOUS | Status: AC
  Administered 2023-03-25: 2000 mg via INTRAVESICAL
  Filled 2023-03-25: qty 2000

## 2023-03-25 MED ORDER — CEFAZOLIN SODIUM-DEXTROSE 2-4 GM/100ML-% IV SOLN
2.0000 g | INTRAVENOUS | Status: AC
  Administered 2023-03-25: 2 g via INTRAVENOUS
  Filled 2023-03-25: qty 100

## 2023-03-25 MED ORDER — PHENYLEPHRINE 80 MCG/ML (10ML) SYRINGE FOR IV PUSH (FOR BLOOD PRESSURE SUPPORT)
PREFILLED_SYRINGE | INTRAVENOUS | Status: DC | PRN
  Administered 2023-03-25 (×3): 80 ug via INTRAVENOUS

## 2023-03-25 MED ORDER — ONDANSETRON HCL 4 MG/2ML IJ SOLN
INTRAMUSCULAR | Status: AC
  Filled 2023-03-25: qty 2

## 2023-03-25 MED ORDER — SODIUM CHLORIDE 0.9 % IR SOLN
Status: DC | PRN
  Administered 2023-03-25: 3000 mL via INTRAVESICAL

## 2023-03-25 MED ORDER — ROCURONIUM BROMIDE 10 MG/ML (PF) SYRINGE
PREFILLED_SYRINGE | INTRAVENOUS | Status: DC | PRN
  Administered 2023-03-25: 50 mg via INTRAVENOUS

## 2023-03-25 MED ORDER — IOHEXOL 300 MG/ML  SOLN
INTRAMUSCULAR | Status: DC | PRN
  Administered 2023-03-25: 15 mL

## 2023-03-25 SURGICAL SUPPLY — 16 items
BAG URINE DRAIN 2000ML AR STRL (UROLOGICAL SUPPLIES) IMPLANT
BAG URO CATCHER STRL LF (MISCELLANEOUS) ×1 IMPLANT
CATH FOLEY 2WAY SLVR 5CC 18FR (CATHETERS) IMPLANT
DRAPE FOOT SWITCH (DRAPES) ×1 IMPLANT
ELECT REM PT RETURN 15FT ADLT (MISCELLANEOUS) ×1 IMPLANT
GLOVE BIO SURGEON STRL SZ7.5 (GLOVE) ×1 IMPLANT
GOWN STRL REUS W/ TWL XL LVL3 (GOWN DISPOSABLE) ×1 IMPLANT
KIT TURNOVER KIT A (KITS) IMPLANT
LOOP CUT BIPOLAR 24F LRG (ELECTROSURGICAL) IMPLANT
MANIFOLD NEPTUNE II (INSTRUMENTS) ×1 IMPLANT
PACK CYSTO (CUSTOM PROCEDURE TRAY) ×1 IMPLANT
PLUG CATH AND CAP STRL 200 (CATHETERS) IMPLANT
SYR TOOMEY IRRIG 70ML (MISCELLANEOUS) IMPLANT
SYRINGE TOOMEY IRRIG 70ML (MISCELLANEOUS) IMPLANT
TUBING CONNECTING 10 (TUBING) ×1 IMPLANT
TUBING UROLOGY SET (TUBING) ×1 IMPLANT

## 2023-03-25 NOTE — Transfer of Care (Signed)
 Immediate Anesthesia Transfer of Care Note  Patient: Jeff Burgess  Procedure(s) Performed: TURBT, WITH CHEMOTHERAPEUTIC AGENT INSTILLATION INTO BLADDER; BILATERAL RETROGRADE PYELOGRAM (Bladder)  Patient Location: PACU  Anesthesia Type:General  Level of Consciousness: awake  Airway & Oxygen Therapy: Patient Spontanous Breathing and Patient connected to face mask oxygen  Post-op Assessment: Report given to RN and Post -op Vital signs reviewed and stable  Post vital signs: Reviewed and stable  Last Vitals:  Vitals Value Taken Time  BP    Temp    Pulse    Resp 9 03/25/23 1348  SpO2    Vitals shown include unfiled device data.  Last Pain:  Vitals:   03/25/23 1226  TempSrc: Oral  PainSc:          Complications: No notable events documented.

## 2023-03-25 NOTE — Anesthesia Procedure Notes (Addendum)
 Procedure Name: Intubation Date/Time: 03/25/2023 12:58 PM  Performed by: Florene Route, CRNAPre-anesthesia Checklist: Patient identified, Emergency Drugs available, Suction available and Patient being monitored Patient Re-evaluated:Patient Re-evaluated prior to induction Oxygen Delivery Method: Circle system utilized Preoxygenation: Pre-oxygenation with 100% oxygen Induction Type: IV induction Ventilation: Two handed mask ventilation required Laryngoscope Size: Mac and 4 Grade View: Grade I Tube type: Oral Tube size: 7.5 mm Number of attempts: 1 Airway Equipment and Method: Stylet Placement Confirmation: ETT inserted through vocal cords under direct vision, positive ETCO2 and breath sounds checked- equal and bilateral Secured at: 23 cm Tube secured with: Tape Dental Injury: Teeth and Oropharynx as per pre-operative assessment  Comments: Intubation by Eunice Blase, SRNA

## 2023-03-25 NOTE — Discharge Instructions (Addendum)

## 2023-03-25 NOTE — Op Note (Signed)
 Operative Note  Preoperative diagnosis:  1.  Bladder tumor  Postoperative diagnosis: 1.  Bladder tumor--medium  Procedure(s): 1.  Cystoscopy with bilateral retrograde pyelogram 2.  Transurethral resection of bladder tumor--medium 3.  Intravesical instillation of gemcitabine  Surgeon: Modena Slater, MD  Assistants: None  Anesthesia: General  Complications: None immediate  EBL: Minimal  Specimens: 1.  Bladder tumor  Drains/Catheters: 1.  18 French Foley catheter  Intraoperative findings: 1.  Normal anterior urethra 2.  Borderline obstructing prostate 3.  Bladder mucosa with an approximately 2 cm area of slightly raised erythema with slight papillary change, completely resected  4.  Left retrograde pyelogram without any filling defect or hydronephrosis.  5.  Right retrograde pyelogram without any filling defect or hydronephrosis.  Indication: 88 year old male with history of bladder cancer found to have possible recurrence presents for the previously mentioned operation.  Description of procedure:  The patient was identified and consent was obtained.  The patient was taken to the operating room and placed in the supine position.  The patient was placed under general anesthesia.  Perioperative antibiotics were administered.  The patient was placed in dorsal lithotomy.  Patient was prepped and draped in a standard sterile fashion and a timeout was performed.  A 21 French rigid cystoscope was advanced into the urethra and into the bladder.  Complete cystoscopy was performed with the findings noted above.  I intubated the left ureteral orifice with an open-ended ureteral catheter and a retrograde pyelogram was performed with the findings noted above.  Same was performed on the right again with no abnormal findings.  I withdrew the scope and advanced a 7 French resectoscope with the visual obturator in place into the urethra and into the bladder.  I exchanged for the bipolar working  element and proceeded to resect the area of interest.  I fulgurated the resection bed.  I collected the specimen.  There was no active bleeding noted and no evidence of any perforation.  I withdrew the scope and placed an 91 French Foley catheter.  This concluded the operation.  Patient tolerated the procedure well was stable postoperatively.  In the PACU, I instilled gemcitabine into the bladder where it remained for approximately 1 hour prior to proper disposal.  Plan: Follow-up in 1 week for pathology review

## 2023-03-25 NOTE — H&P (Addendum)
 CC/HPI: Patient is a 88 year old white male underwent cystoscopy and TURBT by Dr. Alvester Morin yesterday 01/13/2022. Was sent home with 16 French Foley catheter. This apparently clotted off overnight seen back in the emergency room. The Foley catheter was changed x 2 with last catheter being placed being at a 20 [place] Foley. Patient was discharged home. Is seen now as an urgent work in as catheter does not seem to be draining. Since discharge home from the emergency room.  Patient had 20 [place] Foley in place. I irrigated with approximately 3 to 4 L of saline with removal of a lot of of clot. Subsequently changed out the catheter to a 22 [place] three-way hematuria catheter. Irrigated Foley no remaining clots but urine remains bright pink in color. Allowed the catheter to sit without irrigation and became dark in color with clots. Subsequently initiated CBI at brisk CBI and it remains pink in coloron brisk CBI.   01/21/2022  Patient status post TURBT. This revealed high-grade T1 urothelial cell carcinoma. Detrusor was present and not involved. Entire tumor was resected during the surgery. Bladder was very thin during the surgery. Surgery was also complicated by hematuria requiring clot evacuation and fulguration on postoperative day 1/2. Patient is now doing a lot better. Presents for pathology review and voiding trial.   05/04/2022  Patient presents for surveillance cystoscopy. No interval hematuria or dysuria.   06/03/2022  Patient status post TURBT. This revealed benign urothelium with submucosa with reactive lymphoceles, histiocytes, giant cell granulomas.   09/09/2022  Patient presents today for surveillance cystoscopy. He completed maintenance BCG x 3.   02/03/2023  Patient presents today for surveillance cystoscopy. No interval hematuria or dysuria. Urinalysis negative.   03/25/2023 Patient presents today for TURBT with instillation of gemcitabine and bilateral retrograde pyelogram.   ALLERGIES: Morphine  Derivatives    MEDICATIONS: Amlodipine Besylate 10 mg tablet Oral  Aspirin Ec 81 mg tablet, delayed release Oral  Metformin Hcl Er 500 mg tablet, extended release 24 hr Oral  Pravastatin Sodium 40 mg tablet  Telmisartan 20 mg tablet  Vitamin D3     GU PSH: Bladder Instill AntiCA Agent - 07/23/2022, 07/16/2022, 07/09/2022, 03/16/2022, 03/09/2022, 03/02/2022, 02/23/2022, 02/16/2022, 02/09/2022 Cysto Bladder Ureth Biopsy - 2017 Cystoscopy - 09/09/2022, 05/04/2022, 01/07/2022, 2019, 2018, 2018, 2018, 2018, 2017 Cystoscopy Fulguration - 01/14/2022 Cystoscopy TURBT >5 cm - 01/13/2022 Cystoscopy TURBT 2-5 cm - 2017 Locm 300-399Mg /Ml Iodine,1Ml - 01/07/2022       PSH Notes: Cystoscopy With Fulguration Medium Lesion (2-5cm), Coronary Artery Single Venous Bypass Graft   NON-GU PSH: Back Surgery (Unspecified) CABG (coronary artery bypass grafting) - 2017 Heart Surgery (Unspecified) Visit Complexity (formerly GPC1X) - 06/03/2022     GU PMH: Bladder Cancer Lateral - 09/09/2022, - 07/23/2022, - 07/16/2022, - 07/09/2022, - 06/03/2022, - 05/04/2022, - 03/16/2022, - 03/09/2022, - 03/02/2022, - 02/23/2022, - 02/16/2022, - 02/09/2022, - 01/21/2022, - 01/14/2022, - 01/07/2022, - 01/07/2022, - 01/05/2022, - 2019, - 2018, - 2018, - 2018, - 2017, - 2017, - 2017 Gross hematuria - 01/14/2022, - 01/07/2022, - 01/05/2022, Gross hematuria, - 2017 BPH w/o LUTS - 2018, - 2018, BPH (benign prostatic hyperplasia), - 2017 Hemorrhagic cystitis - 2017 Bladder, Neoplasm of Unspecified behavior, Bladder tumor - 2017    NON-GU PMH: Other nonspecific abnormal finding of lung field, Lung mass - 2017 Encounter for general adult medical examination without abnormal findings, Encounter for preventive health examination - 2017 Personal history of other diseases of the circulatory system, History of hypertension - 2017, History  of cardiac murmur, - 2017 Personal history of other endocrine, nutritional and metabolic disease, History of hypercholesterolemia -  2017, History of diabetes mellitus, - 2017 Arthritis Benign intracranial hypertension Cardiac murmur, unspecified Diabetes insipidus Diabetes Type 2 Hypertension    FAMILY HISTORY: 1 Daughter - Other 2 sons - Other Benign hematuria - Runs In Family Kidney Failure - Brother Tuberculosis - Mother   SOCIAL HISTORY: Marital Status: Married Preferred Language: English; Ethnicity: Not Hispanic Or Latino; Race: White Current Smoking Status: Patient does not smoke anymore.  Drinks 2 caffeinated drinks per day. Patient's occupation is/was retired.     Notes: Former smoker, Retired, Mother deceased, Caffeine use, Married, Alcohol use, Number of children, Father deceased   REVIEW OF SYSTEMS:    GU Review Male:   Patient denies frequent urination, hard to postpone urination, burning/ pain with urination, get up at night to urinate, leakage of urine, stream starts and stops, trouble starting your stream, have to strain to urinate , erection problems, and penile pain.  Gastrointestinal (Upper):   Patient denies nausea, vomiting, and indigestion/ heartburn.  Gastrointestinal (Lower):   Patient denies diarrhea and constipation.  Constitutional:   Patient denies fever, night sweats, weight loss, and fatigue.  Skin:   Patient denies skin rash/ lesion and itching.  Eyes:   Patient denies blurred vision and double vision.  Ears/ Nose/ Throat:   Patient denies sore throat and sinus problems.  Hematologic/Lymphatic:   Patient denies swollen glands and easy bruising.  Cardiovascular:   Patient denies leg swelling and chest pains.  Respiratory:   Patient denies cough and shortness of breath.  Endocrine:   Patient denies excessive thirst.  Musculoskeletal:   Patient denies back pain and joint pain.  Neurological:   Patient denies headaches and dizziness.  Psychologic:   Patient denies depression and anxiety.   BP (!) 146/77   Pulse 75   Temp 97.8 F (36.6 C) (Oral)   Resp 16   Ht 5\' 9"  (1.753 m)    Wt 92 kg   SpO2 96%   BMI 29.95 kg/m    MULTI-SYSTEM PHYSICAL EXAMINATION:    Constitutional: Well-nourished. No physical deformities. Normally developed. Good grooming.  Gastrointestinal: No mass, no tenderness, no rigidity, non obese abdomen.  Eyes: Normal conjunctivae. Normal eyelids.  Musculoskeletal: Normal gait and station of head and neck.      ASSESSMENT:      ICD-10 Details  1 GU:   Bladder Cancer Lateral - C67.2 Chronic, Stable   PLAN:     Will plan for transurethral resection of bladder tumor with instillation of gemcitabine and bilateral retrograde pyelogram. Risk and benefits discussed.

## 2023-03-27 NOTE — Anesthesia Postprocedure Evaluation (Signed)
 Anesthesia Post Note  Patient: Jeff Burgess  Procedure(s) Performed: TURBT, WITH CHEMOTHERAPEUTIC AGENT INSTILLATION INTO BLADDER; BILATERAL RETROGRADE PYELOGRAM (Bladder)     Patient location during evaluation: PACU Anesthesia Type: General Level of consciousness: awake and alert Pain management: pain level controlled Vital Signs Assessment: post-procedure vital signs reviewed and stable Respiratory status: spontaneous breathing, nonlabored ventilation and respiratory function stable Cardiovascular status: blood pressure returned to baseline and stable Postop Assessment: no apparent nausea or vomiting Anesthetic complications: no   No notable events documented.  Last Vitals:  Vitals:   03/25/23 1500 03/25/23 1524  BP: (!) 159/83 (!) 166/78  Pulse: 75 78  Resp: 17   Temp:  36.6 C  SpO2: 93% 96%    Last Pain:  Vitals:   03/25/23 1524  TempSrc:   PainSc: 3                  Collene Schlichter

## 2023-03-28 ENCOUNTER — Ambulatory Visit
Admission: RE | Admit: 2023-03-28 | Discharge: 2023-03-28 | Disposition: A | Discharge status: Home / Self Care | Payer: Medicare Other | Source: Ambulatory Visit | Attending: Rehabilitation | Admitting: Rehabilitation

## 2023-03-28 DIAGNOSIS — M4722 Other spondylosis with radiculopathy, cervical region: Secondary | ICD-10-CM | POA: Diagnosis not present

## 2023-03-28 DIAGNOSIS — M4802 Spinal stenosis, cervical region: Secondary | ICD-10-CM | POA: Diagnosis not present

## 2023-03-28 DIAGNOSIS — M5412 Radiculopathy, cervical region: Secondary | ICD-10-CM

## 2023-03-28 LAB — SURGICAL PATHOLOGY

## 2023-03-31 DIAGNOSIS — C678 Malignant neoplasm of overlapping sites of bladder: Secondary | ICD-10-CM | POA: Diagnosis not present

## 2023-03-31 DIAGNOSIS — R310 Gross hematuria: Secondary | ICD-10-CM | POA: Diagnosis not present

## 2023-04-02 ENCOUNTER — Other Ambulatory Visit: Payer: Self-pay | Admitting: Internal Medicine

## 2023-04-09 ENCOUNTER — Other Ambulatory Visit: Payer: Self-pay | Admitting: Internal Medicine

## 2023-04-14 ENCOUNTER — Other Ambulatory Visit: Payer: Self-pay | Admitting: Rehabilitation

## 2023-04-14 DIAGNOSIS — M5412 Radiculopathy, cervical region: Secondary | ICD-10-CM | POA: Diagnosis not present

## 2023-04-14 DIAGNOSIS — M4326 Fusion of spine, lumbar region: Secondary | ICD-10-CM | POA: Diagnosis not present

## 2023-04-22 ENCOUNTER — Ambulatory Visit
Admission: RE | Admit: 2023-04-22 | Discharge: 2023-04-22 | Disposition: A | Discharge status: Home / Self Care | Source: Ambulatory Visit | Attending: Rehabilitation | Admitting: Rehabilitation

## 2023-04-22 DIAGNOSIS — M47816 Spondylosis without myelopathy or radiculopathy, lumbar region: Secondary | ICD-10-CM | POA: Diagnosis not present

## 2023-04-22 DIAGNOSIS — M48061 Spinal stenosis, lumbar region without neurogenic claudication: Secondary | ICD-10-CM | POA: Diagnosis not present

## 2023-04-22 DIAGNOSIS — Z981 Arthrodesis status: Secondary | ICD-10-CM | POA: Diagnosis not present

## 2023-04-22 DIAGNOSIS — M5412 Radiculopathy, cervical region: Secondary | ICD-10-CM

## 2023-05-04 DIAGNOSIS — C678 Malignant neoplasm of overlapping sites of bladder: Secondary | ICD-10-CM | POA: Diagnosis not present

## 2023-05-04 DIAGNOSIS — R8271 Bacteriuria: Secondary | ICD-10-CM | POA: Diagnosis not present

## 2023-05-10 DIAGNOSIS — M5412 Radiculopathy, cervical region: Secondary | ICD-10-CM | POA: Diagnosis not present

## 2023-05-11 DIAGNOSIS — C672 Malignant neoplasm of lateral wall of bladder: Secondary | ICD-10-CM | POA: Diagnosis not present

## 2023-05-18 DIAGNOSIS — C678 Malignant neoplasm of overlapping sites of bladder: Secondary | ICD-10-CM | POA: Diagnosis not present

## 2023-05-24 ENCOUNTER — Ambulatory Visit (INDEPENDENT_AMBULATORY_CARE_PROVIDER_SITE_OTHER): Payer: Medicare Other

## 2023-05-24 VITALS — Ht 69.0 in | Wt 202.0 lb

## 2023-05-24 DIAGNOSIS — N1831 Chronic kidney disease, stage 3a: Secondary | ICD-10-CM | POA: Diagnosis not present

## 2023-05-24 DIAGNOSIS — H9193 Unspecified hearing loss, bilateral: Secondary | ICD-10-CM

## 2023-05-24 DIAGNOSIS — E1122 Type 2 diabetes mellitus with diabetic chronic kidney disease: Secondary | ICD-10-CM

## 2023-05-24 DIAGNOSIS — Z0000 Encounter for general adult medical examination without abnormal findings: Secondary | ICD-10-CM

## 2023-05-24 NOTE — Progress Notes (Signed)
 Subjective:  Please attest and cosign this visit due to patients primary care provider not being in the office at the time the visit was completed.  (Pt of Dr A. Plotnikov)   Jeff Burgess is a 88 y.o. who presents for a Medicare Wellness preventive visit.  As a reminder, Annual Wellness Visits don't include a physical exam, and some assessments may be limited, especially if this visit is performed virtually. We may recommend an in-person visit if needed.  Visit Complete: Virtual I connected with  Jeff Burgess on 05/24/23 by a audio enabled telemedicine application and verified that I am speaking with the correct person using two identifiers.  Patient Location: Home  Provider Location: Office/Clinic  I discussed the limitations of evaluation and management by telemedicine. The patient expressed understanding and agreed to proceed.  Vital Signs: Because this visit was a virtual/telehealth visit, some criteria may be missing or patient reported. Any vitals not documented were not able to be obtained and vitals that have been documented are patient reported.  VideoDeclined- This patient declined Librarian, academic. Therefore the visit was completed with audio only.  Persons Participating in Visit: Patient.  AWV Questionnaire: No: Patient Medicare AWV questionnaire was not completed prior to this visit.  Cardiac Risk Factors include: advanced age (>69men, >26 women);diabetes mellitus;dyslipidemia;hypertension;male gender     Objective:     Today's Vitals   05/24/23 0858  Weight: 202 lb (91.6 kg)  Height: 5\' 9"  (1.753 m)   Body mass index is 29.83 kg/m.     05/24/2023    8:55 AM 03/25/2023   10:30 AM 03/18/2023   11:12 AM 02/18/2023   11:18 AM 05/28/2022    1:18 PM 05/26/2022    2:00 PM 05/20/2022    8:55 AM  Advanced Directives  Does Patient Have a Medical Advance Directive? Yes Yes Yes Yes Yes Yes Yes  Type of Engineer, mining of [place];Living will Healthcare Power of [place];Living will Living will;Healthcare Power of Attorney Living will Living will Living will Healthcare Power of [place];Living will  Does patient want to make changes to medical advance directive?  No - Patient declined No - Patient declined No - Patient declined No - Patient declined    Copy of Healthcare Power of Attorney in Chart? No - copy requested No - copy requested No - copy requested  No - copy requested No - copy requested No - copy requested    Current Medications (verified) Outpatient Encounter Medications as of 05/24/2023  Medication Sig   amLODipine  (NORVASC ) 5 MG tablet TAKE 1 TABLET BY MOUTH DAILY   aspirin  81 MG tablet Take 2 tablets (162 mg total) by mouth daily. (Patient taking differently: Take 81 mg by mouth daily.)   cholecalciferol  (VITAMIN D ) 25 MCG (1000 UT) tablet Take 1 tablet (1,000 Units total) by mouth every morning. Follow-up due in April must see provider for refills   CINNAMON PO Take 3,000 mg by mouth daily. With cereal   furosemide  (LASIX ) 20 MG tablet Take 1 tablet (20 mg total) by mouth daily as needed. for leg swelling   gabapentin  (NEURONTIN ) 300 MG capsule Take 300-600 mg by mouth at bedtime as needed (pinched nerve pain.).   isosorbide  mononitrate (IMDUR ) 30 MG 24 hr tablet TAKE 1 TABLET BY MOUTH DAILY   metFORMIN  (GLUCOPHAGE -XR) 500 MG 24 hr tablet TAKE 1 TABLET BY MOUTH TWICE  DAILY   repaglinide  (PRANDIN ) 1 MG tablet TAKE 1 TABLET (1 MG  TOTAL) BY MOUTH 3 (THREE) TIMES DAILY BEFORE MEALS.   telmisartan -hydrochlorothiazide  (MICARDIS  HCT) 80-25 MG tablet TAKE 1 TABLET BY MOUTH DAILY   TYLENOL  500 MG tablet Take 500 mg by mouth as needed for moderate pain.   ZYPITAMAG  4 MG TABS TAKE 1 TABLET BY MOUTH EVERY DAY   No facility-administered encounter medications on file as of 05/24/2023.    Allergies (verified) Atorvastatin , Coreg  [carvedilol ], Lovastatin , and Morphine sulfate   History: Past  Medical History:  Diagnosis Date   Benign localized prostatic hyperplasia with lower urinary tract symptoms (LUTS)    Bladder cancer [facility]) dx 05/ 2017--  urologist- dr Domingo Friend   TCC , Ta of bladder  s/p TURBT 06-02-2015   CAD (coronary artery disease)    no cardiologist--  followed by pcp,  dr plontnikov   Chronic low back pain with sciatica    neuropathy feet   CKD (chronic kidney disease), stage III (HCC)    Diabetes mellitus, type 2 (HCC)    ED (erectile dysfunction)    Full dentures    Heart murmur    History of ETT    08-09-2014   fair exercise tolerance, no chest pain, normal BP response, no ST changes (baseline RBBB);  negative adequate ETT   History of kidney stones    Hyperlipidemia    Hypertension    Hypogonadism male    OA (osteoarthritis)    Right bundle branch block (RBBB)    S/P CABG x 1    1998   Sigmoid diverticulosis    Ventral hernia    midline   Wears glasses    Past Surgical History:  Procedure Laterality Date   COLONOSCOPY WITH PROPOFOL   02-01-2012   CORONARY ARTERY BYPASS GRAFT  1998  at [facility]   x1  vessel    CYSTOSCOPY WITH BIOPSY N/A 10/08/2015   Procedure: CYSTOSCOPY WITH BLADDER BIOPSY;  Surgeon: Bart Born, MD;  Location: [facility];  Service: Urology;  Laterality: N/A;   CYSTOSCOPY WITH FULGERATION N/A 10/08/2015   Procedure: CYSTOSCOPY WITH FULGERATION;  Surgeon: Bart Born, MD;  Location[facility];  Service: Urology;  Laterality: N/A;   CYSTOSCOPY WITH FULGERATION N/A 01/14/2022   Procedure: CYSTOSCOPY WITH FULGERATION;  Surgeon: Samson Croak, MD;  Location: [facility];  Service: Urology;  Laterality: N/A;   TONSILLECTOMY  age 37   TRANSURETHRAL RESECTION OF BLADDER TUMOR Bilateral 05/28/2022   Procedure: TRANSURETHRAL RESECTION OF BLADDER TUMOR (TURBT)  BILATERAL RETROGRADE PYELOGRAM;  Surgeon: Samson Croak, MD;  Location: [facility];  Service: Urology;  Laterality: Bilateral;  1 HR  FOR CASE   TRANSURETHRAL RESECTION OF BLADDER TUMOR WITH GYRUS (TURBT-GYRUS) N/A 06/02/2015   Procedure: TRANSURETHRAL RESECTION OF BLADDER TUMOR WITH GYRUS (TURBT-GYRUS);  Surgeon: Bart Born, MD;  Location: [facility];  Service: Urology;  Laterality: N/A;   Family History  Problem Relation Age of Onset   Arthritis Mother 52       RA   Alcohol abuse Father 23       exposure   Diabetes Brother    Kidney disease Brother    Heart disease Brother    Hypertension Other    Colon cancer Neg Hx    Esophageal cancer Neg Hx    Rectal cancer Neg Hx    Stomach cancer Neg Hx    Social History   Socioeconomic History   Marital status: Married    Spouse name: Not  on file   Number of children: Not on file   Years of education: Not on file   Highest education level: Not on file  Occupational History   Occupation: retired    Comment: but working fulltime  Tobacco Use   Smoking status: Former    Current packs/day: 0.00    Types: Cigarettes    Start date: 01/18/1956    Quit date: 01/18/1971    Years since quitting: 52.3    Passive exposure: Past   Smokeless tobacco: Former    Types: Snuff  Vaping Use   Vaping status: Never Used  Substance and Sexual Activity   Alcohol use: No   Drug use: No   Sexual activity: Not Currently  Other Topics Concern   Not on file  Social History Narrative   Regular exercise-yes   Social Drivers of Health   Financial Resource Strain: Low Risk  (05/24/2023)   Overall Financial Resource Strain (CARDIA)    Difficulty of Paying Living Expenses: Not hard at all  Food Insecurity: No Food Insecurity (05/24/2023)   Hunger Vital Sign    Worried About Running Out of Food in the Last Year: Never true    Ran Out of Food in the Last Year: Never true  Transportation Needs: No Transportation Needs (05/24/2023)   PRAPARE - Administrator, Civil Service (Medical): No    Lack of Transportation (Non-Medical): No  Physical Activity:  Inactive (05/24/2023)   Exercise Vital Sign    Days of Exercise per Week: 0 days    Minutes of Exercise per Session: 0 min  Stress: No Stress Concern Present (05/24/2023)   [employer] of Occupational Health - Occupational Stress Questionnaire    Feeling of Stress : Only a little  Social Connections: Moderately Isolated (05/24/2023)   Social Connection and Isolation Panel [NHANES]    Frequency of Communication with Friends and Family: More than three times a week    Frequency of Social Gatherings with Friends and Family: Once a week    Attends Religious Services: Never    Database administrator or Organizations: No    Attends Engineer, structural: Never    Marital Status: Married    Tobacco Counseling Counseling given: No    Clinical Intake:  Pre-visit preparation completed: Yes  Pain : No/denies pain     BMI - recorded: 29.83 Nutritional Status: BMI 25 -29 Overweight Nutritional Risks: None Diabetes: Yes CBG done?: No Did pt. bring in CBG monitor from home?: No  Lab Results  Component Value Date   HGBA1C 7.8 (H) 02/18/2023   HGBA1C 7.1 (H) 12/20/2022   HGBA1C 7.9 (H) 05/24/2022     How often do you need to have someone help you when you read instructions, pamphlets, or other written materials from your doctor or pharmacy?: 1 - Never  Interpreter Needed?: No  Information entered by :: Kandy Orris, CMA   Activities of Daily Living     05/24/2023    9:12 AM 03/25/2023   10:28 AM  In your present state of health, do you have any difficulty performing the following activities:  Hearing? 0 1  Vision? 0 0  Difficulty concentrating or making decisions? 0 0  Walking or climbing stairs? 0   Dressing or bathing? 0   Doing errands, shopping? 0   Preparing Food and eating ? N   Using the Toilet? N   In the past six months, have you accidently leaked urine? N  Do you have problems with loss of bowel control? N   Managing your Medications? N    Managing your Finances? N   Housekeeping or managing your Housekeeping? N     Patient Care Team: Plotnikov, Oakley Bellman, MD as PCP - General Abel Hoe Coy Ditty, MD as PCP - Cardiology (Cardiology) Samson Croak, MD as Consulting Physician (Urology)  Indicate any recent Medical Services you may have received from other than [facility] providers in the past year (date may be approximate).     Assessment:    This is a routine wellness examination for Manning.  Hearing/Vision screen Hearing Screening - Comments:: Referral to an Audiologist Vision Screening - Comments:: Wears rx glasses - up to date with routine eye exams with [facility]   Goals Addressed               This Visit's Progress     Patient Stated (pt-stated)        Patient stated he wants to manage the discomfort in his legs better to walk better and more often.  Also wants to eat better.       Depression Screen     05/24/2023    9:17 AM 12/20/2022    8:50 AM 05/24/2022   10:02 AM 05/20/2022    8:58 AM 02/17/2022    9:24 AM 05/18/2021    8:59 AM 04/23/2021   10:37 AM  PHQ 2/9 Scores  PHQ - 2 Score 0 0 0 0 0 0 0  PHQ- 9 Score 3  0 0       Fall Risk     05/24/2023    9:13 AM 12/20/2022    8:50 AM 05/24/2022   10:02 AM 05/20/2022    8:54 AM 02/17/2022    9:24 AM  Fall Risk   Falls in the past year? 0 0 0 0 0  Number falls in past yr: 0 0 0 0 0  Injury with Fall? 0 0 0 0 0  Risk for fall due to : No Fall Risks No Fall Risks No Fall Risks No Fall Risks No Fall Risks  Follow up Falls prevention discussed;Falls evaluation completed Falls evaluation completed Falls evaluation completed Falls prevention discussed Falls evaluation completed    MEDICARE RISK AT HOME:  Medicare Risk at Home Any stairs in or around the home?: No If so, are there any without handrails?: No Home free of loose throw rugs in walkways, pet beds, electrical cords, etc?: Yes Adequate lighting in your home to reduce risk  of falls?: Yes Life alert?: No Use of a cane, walker or w/c?: Yes (cane/walker) Grab bars in the bathroom?: No Shower chair or bench in shower?: No Elevated toilet seat or a handicapped toilet?: Yes  TIMED UP AND GO:  Was the test performed?  No  Cognitive Function: 6CIT completed    09/16/2016    9:58 AM  MMSE - Mini Mental State Exam  Orientation to time 5  Orientation to Place 5  Registration 3  Attention/ Calculation 5  Recall 2  Language- name 2 objects 2  Language- repeat 1  Language- follow 3 step command 3  Language- read & follow direction 1  Write a sentence 1  Copy design 1  Total score 29        05/24/2023    9:15 AM 05/20/2022    8:57 AM 05/18/2021    9:09 AM  6CIT Screen  What Year? 0 points 0 points  0 points  What month? 0 points 0 points 0 points  What time? 0 points 0 points 0 points  Count back from 20 0 points 0 points 0 points  Months in reverse 2 points 0 points 0 points  Repeat phrase 2 points 0 points 0 points  Total Score 4 points 0 points 0 points    Immunizations Immunization History  Administered Date(s) Administered   Fluad Quad(high Dose 65+) 09/25/2018, 09/25/2019, 10/22/2020, 10/26/2021   Influenza Split 11/11/2011   Influenza Whole 11/05/2005   Influenza, High Dose Seasonal PF 12/08/2015, 09/16/2016, 09/19/2017   Influenza,inj,Quad PF,6+ Mos 10/05/2012, 09/30/2014   Moderna Sars-Covid-2 Vaccination 02/15/2019, 03/15/2019   PFIZER(Purple Top)SARS-COV-2 Vaccination 12/12/2019   Pneumococcal Conjugate-13 04/04/2013   Pneumococcal Polysaccharide-23 07/02/2005, 12/16/2016   Td 11/11/2011    Screening Tests Health Maintenance  Topic Date Due   FOOT EXAM  09/20/2018   DTaP/Tdap/Td (2 - Tdap) 11/10/2021   COVID-19 Vaccine (4 - 2024-25 season) 09/12/2022   INFLUENZA VACCINE  08/12/2023   OPHTHALMOLOGY EXAM  09/17/2023   Medicare Annual Wellness (AWV)  05/23/2024   Pneumonia Vaccine 69+ Years old  Completed   HPV VACCINES  Aged Out    Meningococcal B Vaccine  Aged Out   Zoster Vaccines- Shingrix  Discontinued    Health Maintenance  Health Maintenance Due  Topic Date Due   FOOT EXAM  09/20/2018   DTaP/Tdap/Td (2 - Tdap) 11/10/2021   COVID-19 Vaccine (4 - 2024-25 season) 09/12/2022   Health Maintenance Items Addressed:  Referral to an Audiologist - pt c/o difficulty hearing  Referral to a Nutritionist (Diabetic Management) - pt is requesting assistance with diet.  Additional Screening:  Vision Screening: Recommended annual ophthalmology exams for early detection of glaucoma and other disorders of the eye. Pt stated he has annual diabetic eye exams w/[facility].  Dental Screening: Recommended annual dental exams for proper oral hygiene  Community Resource Referral / Chronic Care Management: CRR required this visit?  No   CCM required this visit?  No   Plan:    I have personally reviewed and noted the following in the patient's chart:   Medical and social history Use of alcohol, tobacco or illicit drugs  Current medications and supplements including opioid prescriptions. Patient is not currently taking opioid prescriptions. Functional ability and status Nutritional status Physical activity Advanced directives List of other physicians Hospitalizations, surgeries, and ER visits in previous 12 months Vitals Screenings to include cognitive, depression, and falls Referrals and appointments  In addition, I have reviewed and discussed with patient certain preventive protocols, quality metrics, and best practice recommendations. A written personalized care plan for preventive services as well as general preventive health recommendations were provided to patient.   Patria Bookbinder, CMA   05/24/2023   After Visit Summary: (Mail) Due to this being a telephonic visit, the after visit summary with patients personalized plan was offered to patient via mail   Notes: Nothing significant to  report at this time.

## 2023-05-24 NOTE — Patient Instructions (Addendum)
 [name] , Thank you for taking time out of your busy schedule to complete your Annual Wellness Visit with me. I enjoyed our conversation and look forward to speaking with you again next year. I, as well as your care team,  appreciate your ongoing commitment to your health goals. Please review the following plan we discussed and let me know if I can assist you in the future. Your Game plan/ To Do List    Referrals: If you haven't heard from the office you've been referred to, please reach out to them at the phone provided.  Referral to an Audiologist due to difficulty hearing.  Referral to a Nutritionist to assist with Diabetes Diet/Management. Follow up Visits: Next Medicare AWV with our clinical staff: 06/14/2024   Have you seen your provider in the last 6 months (3 months if uncontrolled diabetes)? No - scheduled an appointment for 06/14/2023 Next Office Visit with your provider: Scheduled for 06/14/2024  Clinician Recommendations:  Aim for 30 minutes of exercise or brisk walking, 6-8 glasses of water , and 5 servings of fruits and vegetables each day. Educated and advised on getting the Tdap (Tetenus) and COVID vaccines in 2025.      This is a list of the screening recommended for you and due dates:  Health Maintenance  Topic Date Due   Complete foot exam   09/20/2018   DTaP/Tdap/Td vaccine (2 - Tdap) 11/10/2021   COVID-19 Vaccine (4 - 2024-25 season) 09/12/2022   Flu Shot  08/12/2023   Eye exam for diabetics  09/17/2023   Medicare Annual Wellness Visit  05/23/2024   Pneumonia Vaccine  Completed   HPV Vaccine  Aged Out   Meningitis B Vaccine  Aged Out   Zoster (Shingles) Vaccine  Discontinued    Advanced directives: (Copy Requested) Please bring a copy of your health care power of attorney and living will to the office to be added to your chart at your convenience. You can mail to [facility] [address] [place], [place] [address] or email to  [email or web address] Advance Care Planning is important because it:  [x]  Makes sure you receive the medical care that is consistent with your values, goals, and preferences  [x]  It provides guidance to your family and loved ones and reduces their decisional burden about whether or not they are making the right decisions based on your wishes.  Follow the link provided in your after visit summary or read over the paperwork we have mailed to you to help you started getting your Advance Directives in place. If you need assistance in completing these, please reach out to us  so that we can help you!

## 2023-05-25 ENCOUNTER — Ambulatory Visit: Payer: Self-pay

## 2023-05-25 NOTE — Telephone Encounter (Signed)
 Please call patient back at 10 am. Patient's daughter called in and patient will be expecting call at 10 am for assessment. Please call daughter assessing if appt is recommended, because daughter will need to pick up patient and she lives 40 minutes away. Daughter's number is [phone].    Daughter states she lives 1 hour away from patient, but patient called her last night and stated he had a fall while trying to put the dog in the kennel. Patient stated he hit his buttocks and bumped his head. Patient's spouse states there was blood on his pants and he scraped his buttocks. Patient's daughter uncertain if patient is on blood thinner or if he hit his head, but patient's daughter states patient was up and moving around this morning with his walker, as normal, and was able to cook himself breakfast. Patient's daughter would like patient assessed around 10 am by calling patient on his cell phone.  Copied from CRM [id number]. Topic: Clinical - Red Word Triage >> May 25, 2023  9:04 AM Kita Perish H wrote: [facility] that prompted transfer to Nurse Triage: Patients daughter states patient fell yesterday, sore, hurt lower back and bumped head Answer Assessment - Initial Assessment Questions 1. MECHANISM: "How did the fall happen?"     Raining and trying to get dog in kennel and dog caused a fall 2. DOMESTIC VIOLENCE AND ELDER ABUSE SCREENING: "Did you fall because someone pushed you or tried to hurt you?" If Yes, ask: "Are you safe now?"     no 3. ONSET: "When did the fall happen?" (e.g., minutes, hours, or days ago)     Yesterday 4. LOCATION: "What part of the body hit the ground?" (e.g., back, buttocks, head, hips, knees, hands, head, stomach)     Lower back and bumped head 5. INJURY: "Did you hurt (injure) yourself when you fell?" If Yes, ask: "What did you injure? Tell me more about this?" (e.g., body area; type of injury; pain severity)"     Daughter doesn't think patient broke any bones 6. PAIN: "Is  there any pain?" If Yes, ask: "How bad is the pain?" (e.g., Scale 1-10; or mild,  moderate, severe)   - NONE (0): No pain   - MILD (1-3): Doesn't interfere with normal activities    - MODERATE (4-7): Interferes with normal activities or awakens from sleep    - SEVERE (8-10): Excruciating pain, unable to do any normal activities      Unable to assess - will call back 7. SIZE: For cuts, bruises, or swelling, ask: "How large is it?" (e.g., inches or centimeters)      Has scrape on buttocks 9. OTHER SYMPTOMS: "Do you have any other symptoms?" (e.g., dizziness, fever, weakness; new onset or worsening).      unsure 10. CAUSE: "What do you think caused the fall (or falling)?" (e.g., tripped, dizzy spell)       Trying to get the dog in the kennel  Protocols used: Falls and [facility]

## 2023-05-25 NOTE — Progress Notes (Unsigned)
    Subjective:    Patient ID: Jeff Burgess, male    DOB: 1935/11/03, 88 y.o.   MRN: [id number]      HPI Jeff Burgess is here for No chief complaint on file.   Fell 2 days ago.  He was putting his dog into his kennel and fell on his left side/elbow and bumped his head on a tree.  He denies LOC, head pain, dizziness, changes in vision or changes in speech.  He had lower back pain w/o radiation, n/t or weakness.        Medications and allergies reviewed with patient and updated if appropriate.  Current Outpatient Medications on File Prior to Visit  Medication Sig Dispense Refill   amLODipine  (NORVASC ) 5 MG tablet TAKE 1 TABLET BY MOUTH DAILY 180 tablet 3   aspirin  81 MG tablet Take 2 tablets (162 mg total) by mouth daily. (Patient taking differently: Take 81 mg by mouth daily.) 100 tablet 3   cholecalciferol  (VITAMIN D ) 25 MCG (1000 UT) tablet Take 1 tablet (1,000 Units total) by mouth every morning. Follow-up due in April must see provider for refills 90 tablet 0   CINNAMON PO Take 3,000 mg by mouth daily. With cereal     furosemide  (LASIX ) 20 MG tablet Take 1 tablet (20 mg total) by mouth daily as needed. for leg swelling 90 tablet 3   gabapentin  (NEURONTIN ) 300 MG capsule Take 300-600 mg by mouth at bedtime as needed (pinched nerve pain.).     isosorbide  mononitrate (IMDUR ) 30 MG 24 hr tablet TAKE 1 TABLET BY MOUTH DAILY 100 tablet 2   metFORMIN  (GLUCOPHAGE -XR) 500 MG 24 hr tablet TAKE 1 TABLET BY MOUTH TWICE  DAILY 200 tablet 2   repaglinide  (PRANDIN ) 1 MG tablet TAKE 1 TABLET (1 MG TOTAL) BY MOUTH 3 (THREE) TIMES DAILY BEFORE MEALS. 300 tablet 3   telmisartan -hydrochlorothiazide  (MICARDIS  HCT) 80-25 MG tablet TAKE 1 TABLET BY MOUTH DAILY 90 tablet 3   TYLENOL  500 MG tablet Take 500 mg by mouth as needed for moderate pain.     ZYPITAMAG  4 MG TABS TAKE 1 TABLET BY MOUTH EVERY DAY 90 tablet 3   No current facility-administered medications on file prior to visit.    Review of  Systems     Objective:  There were no vitals filed for this visit. BP Readings from Last 3 Encounters:  03/25/23 (!) 166/78  03/21/23 (!) 144/70  03/18/23 136/71   Wt Readings from Last 3 Encounters:  05/24/23 202 lb (91.6 kg)  03/25/23 202 lb 13.2 oz (92 kg)  03/23/23 205 lb (93 kg)   There is no height or weight on file to calculate BMI.    Physical Exam         Assessment & Plan:    Fall, lower back pain : Acute Fell at home 2 days ago

## 2023-05-25 NOTE — Telephone Encounter (Signed)
  Chief Complaint: Fall Symptoms: low back pain Frequency: began yesterday Pertinent Negatives: Patient denies weakness, numbness,  Disposition: [] ED /[] Urgent Care (no appt availability in office) / [x] Appointment(In office/virtual)/ []  [place] [facility]/ [] Home Care/ [] Refused Recommended Disposition /[] [facility]/ []  Follow-up with PCP Additional Notes: Pt reports he fell putting his dog into his kennel yesterday evening. Pt notes he fell on left side/elbow and "bumped" his head on tree, pt denies HA, vision changes, LOC, changes in speech, dizziness, does not take blood thinner. Pt reports low back pain, denies radiation of pain, numbness, weakness, urinary symptoms. OV scheduled, per pt dtr made aware, will transport. This RN educated pt on home care, new-worsening symptoms, when to call back/seek emergent care. Pt verbalized understanding and agrees to plan.

## 2023-05-26 ENCOUNTER — Encounter: Payer: Self-pay | Admitting: Internal Medicine

## 2023-05-26 ENCOUNTER — Ambulatory Visit: Admitting: Internal Medicine

## 2023-05-26 VITALS — BP 120/60 | HR 98 | Temp 98.3°F | Ht 69.0 in | Wt 198.0 lb

## 2023-05-26 DIAGNOSIS — M545 Low back pain, unspecified: Secondary | ICD-10-CM

## 2023-05-26 DIAGNOSIS — I10 Essential (primary) hypertension: Secondary | ICD-10-CM

## 2023-05-26 DIAGNOSIS — W19XXXA Unspecified fall, initial encounter: Secondary | ICD-10-CM

## 2023-05-26 NOTE — Patient Instructions (Addendum)
     Your back pain is likely a muscle strain.   Apply heat, ice, a topical medication.  Try stretching the back and move around - listen to your body - do not do anything that hurts too much.    Return if symptoms worsen or fail to improve.

## 2023-05-30 DIAGNOSIS — M1712 Unilateral primary osteoarthritis, left knee: Secondary | ICD-10-CM | POA: Diagnosis not present

## 2023-05-30 DIAGNOSIS — M48061 Spinal stenosis, lumbar region without neurogenic claudication: Secondary | ICD-10-CM | POA: Diagnosis not present

## 2023-05-30 DIAGNOSIS — M5416 Radiculopathy, lumbar region: Secondary | ICD-10-CM | POA: Diagnosis not present

## 2023-06-01 DIAGNOSIS — C678 Malignant neoplasm of overlapping sites of bladder: Secondary | ICD-10-CM | POA: Diagnosis not present

## 2023-06-01 DIAGNOSIS — Z5111 Encounter for antineoplastic chemotherapy: Secondary | ICD-10-CM | POA: Diagnosis not present

## 2023-06-02 NOTE — Progress Notes (Signed)
 Chief Complaint  Patient presents with   Follow-up    Aortic stenosis    History of Present Illness: 88 yo male with history of CAD s/p 1V CABG in 1998, carotid artery disease, PAD, HTN, HLD, CKD stage 3, DM, bladder cancer and chronic RBBB who is here today for follow up. He was admitted to [facility] in January 2024 with chest pain, mild troponin elevation. He was started on heparin  but developed hematuria so no cardiac cath was performed. He was not started on Plavix due to bleeding risk and was not started on a beta blocker due to prior intolerance. Echo 02/02/22 with LVEF=55%. Mild MR. Moderate aortic stenosis with mean gradient 23 mmHg, DI 0.35, AVA 1.2 cm2. Echo February 2025 with LVEF=50-55%. Low flow/low gradient severe aortic stenosis with mean gradient 25 mmHg, AVA 0.83 cm2, SVI 33, DI 0.26. Nuclear stress test March 2025 with no ischemia.   He tells me today that he has progressive dyspnea with exertion and fatigue. He has been very active until the past few months and now sits in his chair all day due to fatigue. He has mild chest pressure with exertion. He lives in [place], [place] with his wife. He is retired from Social worker. He has full dentures.   Primary Care Physician: Genia Kettering, MD  Past Medical History:  Diagnosis Date   Benign localized prostatic hyperplasia with lower urinary tract symptoms (LUTS)    Bladder cancer [facility]) dx 05/ 2017--  urologist- dr Domingo Friend   TCC , Ta of bladder  s/p TURBT 06-02-2015   CAD (coronary artery disease)    no cardiologist--  followed by pcp,  dr plontnikov   Chronic low back pain with sciatica    neuropathy feet   CKD (chronic kidney disease), stage III (HCC)    Diabetes mellitus, type 2 [facility])    ED (erectile dysfunction)    Full dentures    Heart murmur    History of ETT    08-09-2014   fair exercise tolerance, no chest pain, normal BP response, no ST changes (baseline RBBB);  negative adequate ETT   History of  kidney stones    Hyperlipidemia    Hypertension    Hypogonadism male    OA (osteoarthritis)    Right bundle branch block (RBBB)    S/P CABG x 1    1998   Sigmoid diverticulosis    Ventral hernia    midline   Wears glasses     Past Surgical History:  Procedure Laterality Date   COLONOSCOPY WITH PROPOFOL   02-01-2012   CORONARY ARTERY BYPASS GRAFT  1998  at [facility]   x1  vessel    CYSTOSCOPY WITH BIOPSY N/A 10/08/2015   Procedure: CYSTOSCOPY WITH BLADDER BIOPSY;  Surgeon: Bart Born, MD;  Location: [facility];  Service: Urology;  Laterality: N/A;   CYSTOSCOPY WITH FULGERATION N/A 10/08/2015   Procedure: CYSTOSCOPY WITH FULGERATION;  Surgeon: Bart Born, MD;  Location: [facility];  Service: Urology;  Laterality: N/A;   CYSTOSCOPY WITH FULGERATION N/A 01/14/2022   Procedure: CYSTOSCOPY WITH FULGERATION;  Surgeon: Samson Croak, MD;  Location: [facility];  Service: Urology;  Laterality: N/A;   TONSILLECTOMY  age 64   TRANSURETHRAL RESECTION OF BLADDER TUMOR Bilateral 05/28/2022   Procedure: TRANSURETHRAL RESECTION OF BLADDER TUMOR (TURBT)  BILATERAL RETROGRADE PYELOGRAM;  Surgeon: Samson Croak, MD;  Location: [facility];  Service: Urology;  Laterality:  Bilateral;  1 HR FOR CASE   TRANSURETHRAL RESECTION OF BLADDER TUMOR WITH GYRUS (TURBT-GYRUS) N/A 06/02/2015   Procedure: TRANSURETHRAL RESECTION OF BLADDER TUMOR WITH GYRUS (TURBT-GYRUS);  Surgeon: Bart Born, MD;  Location[facility];  Service: Urology;  Laterality: N/A;    Current Outpatient Medications  Medication Sig Dispense Refill   amLODipine  (NORVASC ) 5 MG tablet TAKE 1 TABLET BY MOUTH DAILY 180 tablet 3   aspirin  81 MG tablet Take 2 tablets (162 mg total) by mouth daily. (Patient taking differently: Take 81 mg by mouth daily.) 100 tablet 3   cholecalciferol  (VITAMIN D ) 25 MCG (1000 UT) tablet Take 1 tablet (1,000 Units total) by mouth every morning.  Follow-up due in April must see provider for refills 90 tablet 0   CINNAMON PO Take 3,000 mg by mouth daily. With cereal     furosemide  (LASIX ) 40 MG tablet Take 1 tablet (40 mg total) by mouth daily. 90 tablet 3   isosorbide  mononitrate (IMDUR ) 30 MG 24 hr tablet TAKE 1 TABLET BY MOUTH DAILY 100 tablet 2   metFORMIN  (GLUCOPHAGE -XR) 500 MG 24 hr tablet TAKE 1 TABLET BY MOUTH TWICE  DAILY 200 tablet 2   repaglinide  (PRANDIN ) 1 MG tablet TAKE 1 TABLET (1 MG TOTAL) BY MOUTH 3 (THREE) TIMES DAILY BEFORE MEALS. 300 tablet 3   telmisartan -hydrochlorothiazide  (MICARDIS  HCT) 80-25 MG tablet TAKE 1 TABLET BY MOUTH DAILY 90 tablet 3   TYLENOL  500 MG tablet Take 500 mg by mouth as needed for moderate pain.     ZYPITAMAG  4 MG TABS TAKE 1 TABLET BY MOUTH EVERY DAY 90 tablet 3   gabapentin  (NEURONTIN ) 300 MG capsule Take 300-600 mg by mouth at bedtime as needed (pinched nerve pain.). (Patient not taking: Reported on 06/03/2023)     No current facility-administered medications for this visit.    Allergies  Allergen Reactions   Atorvastatin  Other (See Comments)    Weak legs   Coreg  [Carvedilol ] Other (See Comments)    weakness   Lovastatin  Other (See Comments)    Leg weakness   Morphine Sulfate Other (See Comments)    Per the pt: "After taking morphine, within 30 mins, I was soaking wet. I did not like the way it made me feel."    Social History   Socioeconomic History   Marital status: Married    Spouse name: Not on file   Number of children: Not on file   Years of education: Not on file   Highest education level: Not on file  Occupational History   Occupation: retired    Comment: but working fulltime  Tobacco Use   Smoking status: Former    Current packs/day: 0.00    Types: Cigarettes    Start date: 01/18/1956    Quit date: 01/18/1971    Years since quitting: 52.4    Passive exposure: Past   Smokeless tobacco: Former    Types: Snuff  Vaping Use   Vaping status: Never Used  Substance and  Sexual Activity   Alcohol use: No   Drug use: No   Sexual activity: Not Currently  Other Topics Concern   Not on file  Social History Narrative   Regular exercise-yes   Social Drivers of Health   Financial Resource Strain: Low Risk  (05/30/2023)   Received from [facility]   Overall Financial Resource Strain (CARDIA)    Difficulty of Paying Living Expenses: Not hard at all  Food Insecurity: No Food Insecurity (05/30/2023)  Received from [facility] Vital Sign    Worried About Running Out of Food in the Last Year: Never true    Ran Out of Food in the Last Year: Never true  Transportation Needs: No Transportation Needs (05/30/2023)   Received from [facility]   PRAPARE - Transportation    Lack of Transportation (Medical): No    Lack of Transportation (Non-Medical): No  Physical Activity: Inactive (05/24/2023)   Exercise Vital Sign    Days of Exercise per Week: 0 days    Minutes of Exercise per Session: 0 min  Stress: No Stress Concern Present (05/24/2023)   [employer] of Occupational Health - Occupational Stress Questionnaire    Feeling of Stress : Only a little  Social Connections: Moderately Isolated (05/24/2023)   Social Connection and Isolation Panel [NHANES]    Frequency of Communication with Friends and Family: More than three times a week    Frequency of Social Gatherings with Friends and Family: Once a week    Attends Religious Services: Never    Database administrator or Organizations: No    Attends Banker Meetings: Never    Marital Status: Married  Catering manager Violence: Not At Risk (05/24/2023)   Humiliation, Afraid, Rape, and Kick questionnaire    Fear of Current or Ex-Partner: No    Emotionally Abused: No    Physically Abused: No    Sexually Abused: No    Family History  Problem Relation Age of Onset   Arthritis Mother 63       RA   Alcohol abuse Father 29       exposure   Diabetes Brother    Kidney disease Brother     Heart disease Brother    Hypertension Other    Colon cancer Neg Hx    Esophageal cancer Neg Hx    Rectal cancer Neg Hx    Stomach cancer Neg Hx     Review of Systems:  As stated in the HPI and otherwise negative.   BP 136/66   Pulse 79   Ht 5\' 9"  (1.753 m)   Wt 200 lb (90.7 kg)   SpO2 98%   BMI 29.53 kg/m   Physical Examination: General: Well developed, well nourished, NAD  HEENT: OP clear, mucus membranes moist  SKIN: warm, dry. No rashes. Neuro: No focal deficits  Musculoskeletal: Muscle strength 5/5 all ext  Psychiatric: Mood and affect normal  Neck: No JVD, no carotid bruits, no thyromegaly, no lymphadenopathy.  Lungs:Clear bilaterally, no wheezes, rhonci, crackles Cardiovascular: Regular rate and rhythm. Loud, harsh, late peaking systolic murmur.  Abdomen:Soft. Bowel sounds present. Non-tender.  Extremities: Trace bilateral lower extremity edema.   EKG:  EKG is ordered today. The ekg ordered today demonstrates  EKG Interpretation Date/Time:  Friday Jun 03 2023 09:32:35 EDT Ventricular Rate:  76 PR Interval:  168 QRS Duration:  142 QT Interval:  420 QTC Calculation: 472 R Axis:   -37  Text Interpretation: Sinus rhythm with frequent Premature ventricular complexes Left axis deviation Right bundle branch block Confirmed by Antoinette Batman [phone]) on 06/03/2023 9:38:12 AM    Echo 02/23/23:  1. Left ventricular ejection fraction, by estimation, is 50 to 55%. Left  ventricular ejection fraction by 3D volume is 50 %. The left ventricle has  low normal function. The left ventricle has no regional wall motion  abnormalities. Left ventricular  diastolic parameters are consistent with Grade I diastolic dysfunction  (impaired relaxation).  2. Right ventricular systolic function is normal. The right ventricular  size is normal.   3. The mitral valve is degenerative. Trivial mitral valve regurgitation.  No evidence of mitral stenosis.   4. The aortic valve is  tricuspid. Aortic valve regurgitation is mild.  Moderate to severe aortic valve stenosis. Aortic regurgitation PHT  measures 485 msec. Aortic valve mean gradient measures 25.0 mmHg. Aortic  valve Vmax measures 3.25 m/s. Although the  mean AVG and Vmax are c/w moderate AS, the DVI is low at 0.26 and SVI low  at 33. Findings are consistent with low flow low gradient moderate to  severe AS.   5. The inferior vena cava is normal in size with greater than 50%  respiratory variability, suggesting right atrial pressure of 3 mmHg.   6. Compared to echo dated 02/02/2022, the mean AVG is essentially  unchanged but DVI has decreased from 0.35 to 0.26 and AVA has decreased  from 1.2cm2 to 0.83cm2 (VTI).   FINDINGS   Left Ventricle: Left ventricular ejection fraction, by estimation, is 50  to 55%. Left ventricular ejection fraction by 3D volume is 50 %. The left  ventricle has low normal function. The left ventricle has no regional wall  motion abnormalities. Strain  imaging was not performed. The left ventricular internal cavity size was  normal in size. There is no left ventricular hypertrophy. Left ventricular  diastolic parameters are consistent with Grade I diastolic dysfunction  (impaired relaxation). Normal left  ventricular filling pressure.   Right Ventricle: The right ventricular size is normal. No increase in  right ventricular wall thickness. Right ventricular systolic function is  normal.   Left Atrium: Left atrial size was normal in size.   Right Atrium: Right atrial size was normal in size.   Pericardium: There is no evidence of pericardial effusion.   Mitral Valve: The mitral valve is degenerative in appearance. There is  mild calcification of the mitral valve leaflet(s). Trivial mitral valve  regurgitation. No evidence of mitral valve stenosis.   Tricuspid Valve: The tricuspid valve is normal in structure. Tricuspid  valve regurgitation is trivial. No evidence of tricuspid  stenosis.   Aortic Valve: The aortic valve is tricuspid. Aortic valve regurgitation is  mild. Aortic regurgitation PHT measures 485 msec. Moderate to severe  aortic stenosis is present. Aortic valve mean gradient measures 25.0 mmHg.  Aortic valve peak gradient measures   42.2 mmHg. Aortic valve area, by VTI measures 0.83 cm.   Pulmonic Valve: The pulmonic valve was normal in structure. Pulmonic valve  regurgitation is trivial. No evidence of pulmonic stenosis.   Aorta: The aortic root is normal in size and structure.   Venous: The inferior vena cava is normal in size with greater than 50%  respiratory variability, suggesting right atrial pressure of 3 mmHg.   IAS/Shunts: No atrial level shunt detected by color flow Doppler.   Additional Comments: 3D was performed not requiring image post processing  on an independent workstation and was abnormal.     LEFT VENTRICLE  PLAX 2D  LVIDd:         5.60 cm         Diastology  LVIDs:         4.00 cm         LV e' medial:    5.43 cm/s  LV PW:         0.90 cm         LV E/e' medial:  10.2  LV IVS:        1.00 cm         LV e' lateral:   5.92 cm/s  LVOT diam:     2.00 cm         LV E/e' lateral: 9.4  LV SV:         67  LV SV Index:   33  LVOT Area:     3.14 cm        3D Volume EF                                 LV 3D EF:    Left                                              ventricul                                              ar                                              ejection                                              fraction                                              by 3D                                              volume is                                              50 %.                                   3D Volume EF:                                 3D EF:        50 %                                 LV EDV:       156 ml  LV ESV:       78 ml                                 LV SV:         78 ml   RIGHT VENTRICLE  RV Basal diam:  3.90 cm  RV Mid diam:    3.50 cm  RV S prime:     7.95 cm/s  TAPSE (M-mode): 1.6 cm   LEFT ATRIUM             Index        RIGHT ATRIUM           Index  LA diam:        3.90 cm 1.91 cm/m   RA Area:     13.80 cm  LA Vol (A2C):   28.0 ml 13.70 ml/m  RA Volume:   26.40 ml  12.92 ml/m  LA Vol (A4C):   33.9 ml 16.59 ml/m  LA Biplane Vol: 31.8 ml 15.56 ml/m   AORTIC VALVE  AV Area (Vmax):    0.94 cm  AV Area (Vmean):   0.90 cm  AV Area (VTI):     0.83 cm  AV Vmax:           325.00 cm/s  AV Vmean:          221.500 cm/s  AV VTI:            0.811 m  AV Peak Grad:      42.2 mmHg  AV Mean Grad:      25.0 mmHg  LVOT Vmax:         96.87 cm/s  LVOT Vmean:        63.567 cm/s  LVOT VTI:          0.213 m  LVOT/AV VTI ratio: 0.26  AI PHT:            485 msec    AORTA  Ao Root diam: 2.90 cm  Ao Asc diam:  3.30 cm   MITRAL VALVE  MV Area (PHT): 2.66 cm    SHUNTS  MV Decel Time: 285 msec    Systemic VTI:  0.21 m  MV E velocity: 55.50 cm/s  Systemic Diam: 2.00 cm  MV A velocity: 94.90 cm/s  MV E/A ratio:  0.58   Recent Labs: 12/20/2022: ALT 22; TSH 2.79 06/03/2023: BUN 46; Creatinine, Ser 1.75; Hemoglobin 13.4; Platelets 248; Potassium 4.8; Sodium 139   Lipid Panel    Component Value Date/Time   CHOL 121 12/20/2022 0930   TRIG 151.0 (H) 12/20/2022 0930   HDL 32.40 (L) 12/20/2022 0930   CHOLHDL 4 12/20/2022 0930   VLDL 30.2 12/20/2022 0930   LDLCALC 59 12/20/2022 0930   LDLDIRECT 78.4 11/02/2011 0936     Wt Readings from Last 3 Encounters:  06/03/23 200 lb (90.7 kg)  05/26/23 198 lb (89.8 kg)  05/24/23 202 lb (91.6 kg)     Assessment and Plan:   1. Severe Aortic Valve Stenosis: He has severe, stage D2 (paradoxical low flow/low gradient) aortic valve stenosis. He has NYHA class 3 symptoms. I have personally reviewed the echo images. The aortic valve is thickened and calcified with limited leaflet mobility. I think he would  benefit from AVR. Given advanced age and prior open heart surgery, he is not a good candidate for conventional AVR by surgical approach. I think he  may be a good candidate for TAVR.   I have reviewed the natural history of aortic stenosis with the patient and their family members  who are present today. We have discussed the limitations of medical therapy and the poor prognosis associated with symptomatic aortic stenosis. We have reviewed potential treatment options, including palliative medical therapy, conventional surgical aortic valve replacement, and transcatheter aortic valve replacement. We discussed treatment options in the context of the patient's specific comorbid medical conditions.   He would like to proceed with planning for TAVR. I will arrange a left heart catheterization at [facility] 06/17/23 at 10:30am. Risks and benefits of the cath procedure and the valve procedure are reviewed with the patient. After the cath, he will have a cardiac CT, CTA of the chest/abdomen and pelvis and will then be referred to see one of the CT surgeons on our TAVR team.   Increase Lasix  to 40 mg daily BMET and CBC today    Labs/ tests ordered today include:   Orders Placed This Encounter  Procedures   CBC   Basic metabolic panel with GFR   EKG [id number]   Disposition:   F/U will be arranged with the structural team  Signed, Antoinette Batman, MD, [facility] 06/04/2023 7:30 AM    [facility] [address] [place], [place], [place]  [address] Phone: [phone]; Fax: [phone]

## 2023-06-02 NOTE — H&P (View-Only) (Signed)
 Chief Complaint  Patient presents with   Follow-up    Aortic stenosis    History of Present Illness: 88 yo male with history of CAD s/p 1V CABG in 1998, carotid artery disease, PAD, HTN, HLD, CKD stage 3, DM, bladder cancer and chronic RBBB who is here today for follow up. He was admitted to [facility] in January 2024 with chest pain, mild troponin elevation. He was started on heparin  but developed hematuria so no cardiac cath was performed. He was not started on Plavix due to bleeding risk and was not started on a beta blocker due to prior intolerance. Echo 02/02/22 with LVEF=55%. Mild MR. Moderate aortic stenosis with mean gradient 23 mmHg, DI 0.35, AVA 1.2 cm2. Echo February 2025 with LVEF=50-55%. Low flow/low gradient severe aortic stenosis with mean gradient 25 mmHg, AVA 0.83 cm2, SVI 33, DI 0.26. Nuclear stress test March 2025 with no ischemia.   He tells me today that he has progressive dyspnea with exertion and fatigue. He has been very active until the past few months and now sits in his chair all day due to fatigue. He has mild chest pressure with exertion. He lives in [place], [place] with his wife. He is retired from Social worker. He has full dentures.   Primary Care Physician: Genia Kettering, MD  Past Medical History:  Diagnosis Date   Benign localized prostatic hyperplasia with lower urinary tract symptoms (LUTS)    Bladder cancer [facility]) dx 05/ 2017--  urologist- dr Domingo Friend   TCC , Ta of bladder  s/p TURBT 06-02-2015   CAD (coronary artery disease)    no cardiologist--  followed by pcp,  dr plontnikov   Chronic low back pain with sciatica    neuropathy feet   CKD (chronic kidney disease), stage III (HCC)    Diabetes mellitus, type 2 [facility])    ED (erectile dysfunction)    Full dentures    Heart murmur    History of ETT    08-09-2014   fair exercise tolerance, no chest pain, normal BP response, no ST changes (baseline RBBB);  negative adequate ETT   History of  kidney stones    Hyperlipidemia    Hypertension    Hypogonadism male    OA (osteoarthritis)    Right bundle branch block (RBBB)    S/P CABG x 1    1998   Sigmoid diverticulosis    Ventral hernia    midline   Wears glasses     Past Surgical History:  Procedure Laterality Date   COLONOSCOPY WITH PROPOFOL   02-01-2012   CORONARY ARTERY BYPASS GRAFT  1998  at [facility]   x1  vessel    CYSTOSCOPY WITH BIOPSY N/A 10/08/2015   Procedure: CYSTOSCOPY WITH BLADDER BIOPSY;  Surgeon: Bart Born, MD;  Location: [facility];  Service: Urology;  Laterality: N/A;   CYSTOSCOPY WITH FULGERATION N/A 10/08/2015   Procedure: CYSTOSCOPY WITH FULGERATION;  Surgeon: Bart Born, MD;  Location: [facility];  Service: Urology;  Laterality: N/A;   CYSTOSCOPY WITH FULGERATION N/A 01/14/2022   Procedure: CYSTOSCOPY WITH FULGERATION;  Surgeon: Samson Croak, MD;  Location: [facility];  Service: Urology;  Laterality: N/A;   TONSILLECTOMY  age 64   TRANSURETHRAL RESECTION OF BLADDER TUMOR Bilateral 05/28/2022   Procedure: TRANSURETHRAL RESECTION OF BLADDER TUMOR (TURBT)  BILATERAL RETROGRADE PYELOGRAM;  Surgeon: Samson Croak, MD;  Location: [facility];  Service: Urology;  Laterality:  Bilateral;  1 HR FOR CASE   TRANSURETHRAL RESECTION OF BLADDER TUMOR WITH GYRUS (TURBT-GYRUS) N/A 06/02/2015   Procedure: TRANSURETHRAL RESECTION OF BLADDER TUMOR WITH GYRUS (TURBT-GYRUS);  Surgeon: Bart Born, MD;  Location: [facility];  Service: Urology;  Laterality: N/A;    Current Outpatient Medications  Medication Sig Dispense Refill   amLODipine  (NORVASC ) 5 MG tablet TAKE 1 TABLET BY MOUTH DAILY 180 tablet 3   aspirin  81 MG tablet Take 2 tablets (162 mg total) by mouth daily. (Patient taking differently: Take 81 mg by mouth daily.) 100 tablet 3   cholecalciferol  (VITAMIN D ) 25 MCG (1000 UT) tablet Take 1 tablet (1,000 Units total) by mouth every morning.  Follow-up due in April must see provider for refills 90 tablet 0   CINNAMON PO Take 3,000 mg by mouth daily. With cereal     furosemide  (LASIX ) 40 MG tablet Take 1 tablet (40 mg total) by mouth daily. 90 tablet 3   isosorbide  mononitrate (IMDUR ) 30 MG 24 hr tablet TAKE 1 TABLET BY MOUTH DAILY 100 tablet 2   metFORMIN  (GLUCOPHAGE -XR) 500 MG 24 hr tablet TAKE 1 TABLET BY MOUTH TWICE  DAILY 200 tablet 2   repaglinide  (PRANDIN ) 1 MG tablet TAKE 1 TABLET (1 MG TOTAL) BY MOUTH 3 (THREE) TIMES DAILY BEFORE MEALS. 300 tablet 3   telmisartan -hydrochlorothiazide  (MICARDIS  HCT) 80-25 MG tablet TAKE 1 TABLET BY MOUTH DAILY 90 tablet 3   TYLENOL  500 MG tablet Take 500 mg by mouth as needed for moderate pain.     ZYPITAMAG  4 MG TABS TAKE 1 TABLET BY MOUTH EVERY DAY 90 tablet 3   gabapentin  (NEURONTIN ) 300 MG capsule Take 300-600 mg by mouth at bedtime as needed (pinched nerve pain.). (Patient not taking: Reported on 06/03/2023)     No current facility-administered medications for this visit.    Allergies  Allergen Reactions   Atorvastatin  Other (See Comments)    Weak legs   Coreg  [Carvedilol ] Other (See Comments)    weakness   Lovastatin  Other (See Comments)    Leg weakness   Morphine Sulfate Other (See Comments)    Per the pt: "After taking morphine, within 30 mins, I was soaking wet. I did not like the way it made me feel."    Social History   Socioeconomic History   Marital status: Married    Spouse name: Not on file   Number of children: Not on file   Years of education: Not on file   Highest education level: Not on file  Occupational History   Occupation: retired    Comment: but working fulltime  Tobacco Use   Smoking status: Former    Current packs/day: 0.00    Types: Cigarettes    Start date: 01/18/1956    Quit date: 01/18/1971    Years since quitting: 52.4    Passive exposure: Past   Smokeless tobacco: Former    Types: Snuff  Vaping Use   Vaping status: Never Used  Substance and  Sexual Activity   Alcohol use: No   Drug use: No   Sexual activity: Not Currently  Other Topics Concern   Not on file  Social History Narrative   Regular exercise-yes   Social Drivers of Health   Financial Resource Strain: Low Risk  (05/30/2023)   Received from [facility]   Overall Financial Resource Strain (CARDIA)    Difficulty of Paying Living Expenses: Not hard at all  Food Insecurity: No Food Insecurity (05/30/2023)  Received from [facility] Vital Sign    Worried About Running Out of Food in the Last Year: Never true    Ran Out of Food in the Last Year: Never true  Transportation Needs: No Transportation Needs (05/30/2023)   Received from [facility]   PRAPARE - Transportation    Lack of Transportation (Medical): No    Lack of Transportation (Non-Medical): No  Physical Activity: Inactive (05/24/2023)   Exercise Vital Sign    Days of Exercise per Week: 0 days    Minutes of Exercise per Session: 0 min  Stress: No Stress Concern Present (05/24/2023)   [employer] of Occupational Health - Occupational Stress Questionnaire    Feeling of Stress : Only a little  Social Connections: Moderately Isolated (05/24/2023)   Social Connection and Isolation Panel [NHANES]    Frequency of Communication with Friends and Family: More than three times a week    Frequency of Social Gatherings with Friends and Family: Once a week    Attends Religious Services: Never    Database administrator or Organizations: No    Attends Banker Meetings: Never    Marital Status: Married  Catering manager Violence: Not At Risk (05/24/2023)   Humiliation, Afraid, Rape, and Kick questionnaire    Fear of Current or Ex-Partner: No    Emotionally Abused: No    Physically Abused: No    Sexually Abused: No    Family History  Problem Relation Age of Onset   Arthritis Mother 63       RA   Alcohol abuse Father 29       exposure   Diabetes Brother    Kidney disease Brother     Heart disease Brother    Hypertension Other    Colon cancer Neg Hx    Esophageal cancer Neg Hx    Rectal cancer Neg Hx    Stomach cancer Neg Hx     Review of Systems:  As stated in the HPI and otherwise negative.   BP 136/66   Pulse 79   Ht 5\' 9"  (1.753 m)   Wt 200 lb (90.7 kg)   SpO2 98%   BMI 29.53 kg/m   Physical Examination: General: Well developed, well nourished, NAD  HEENT: OP clear, mucus membranes moist  SKIN: warm, dry. No rashes. Neuro: No focal deficits  Musculoskeletal: Muscle strength 5/5 all ext  Psychiatric: Mood and affect normal  Neck: No JVD, no carotid bruits, no thyromegaly, no lymphadenopathy.  Lungs:Clear bilaterally, no wheezes, rhonci, crackles Cardiovascular: Regular rate and rhythm. Loud, harsh, late peaking systolic murmur.  Abdomen:Soft. Bowel sounds present. Non-tender.  Extremities: Trace bilateral lower extremity edema.   EKG:  EKG is ordered today. The ekg ordered today demonstrates  EKG Interpretation Date/Time:  Friday Jun 03 2023 09:32:35 EDT Ventricular Rate:  76 PR Interval:  168 QRS Duration:  142 QT Interval:  420 QTC Calculation: 472 R Axis:   -37  Text Interpretation: Sinus rhythm with frequent Premature ventricular complexes Left axis deviation Right bundle branch block Confirmed by Antoinette Batman [phone]) on 06/03/2023 9:38:12 AM    Echo 02/23/23:  1. Left ventricular ejection fraction, by estimation, is 50 to 55%. Left  ventricular ejection fraction by 3D volume is 50 %. The left ventricle has  low normal function. The left ventricle has no regional wall motion  abnormalities. Left ventricular  diastolic parameters are consistent with Grade I diastolic dysfunction  (impaired relaxation).  2. Right ventricular systolic function is normal. The right ventricular  size is normal.   3. The mitral valve is degenerative. Trivial mitral valve regurgitation.  No evidence of mitral stenosis.   4. The aortic valve is  tricuspid. Aortic valve regurgitation is mild.  Moderate to severe aortic valve stenosis. Aortic regurgitation PHT  measures 485 msec. Aortic valve mean gradient measures 25.0 mmHg. Aortic  valve Vmax measures 3.25 m/s. Although the  mean AVG and Vmax are c/w moderate AS, the DVI is low at 0.26 and SVI low  at 33. Findings are consistent with low flow low gradient moderate to  severe AS.   5. The inferior vena cava is normal in size with greater than 50%  respiratory variability, suggesting right atrial pressure of 3 mmHg.   6. Compared to echo dated 02/02/2022, the mean AVG is essentially  unchanged but DVI has decreased from 0.35 to 0.26 and AVA has decreased  from 1.2cm2 to 0.83cm2 (VTI).   FINDINGS   Left Ventricle: Left ventricular ejection fraction, by estimation, is 50  to 55%. Left ventricular ejection fraction by 3D volume is 50 %. The left  ventricle has low normal function. The left ventricle has no regional wall  motion abnormalities. Strain  imaging was not performed. The left ventricular internal cavity size was  normal in size. There is no left ventricular hypertrophy. Left ventricular  diastolic parameters are consistent with Grade I diastolic dysfunction  (impaired relaxation). Normal left  ventricular filling pressure.   Right Ventricle: The right ventricular size is normal. No increase in  right ventricular wall thickness. Right ventricular systolic function is  normal.   Left Atrium: Left atrial size was normal in size.   Right Atrium: Right atrial size was normal in size.   Pericardium: There is no evidence of pericardial effusion.   Mitral Valve: The mitral valve is degenerative in appearance. There is  mild calcification of the mitral valve leaflet(s). Trivial mitral valve  regurgitation. No evidence of mitral valve stenosis.   Tricuspid Valve: The tricuspid valve is normal in structure. Tricuspid  valve regurgitation is trivial. No evidence of tricuspid  stenosis.   Aortic Valve: The aortic valve is tricuspid. Aortic valve regurgitation is  mild. Aortic regurgitation PHT measures 485 msec. Moderate to severe  aortic stenosis is present. Aortic valve mean gradient measures 25.0 mmHg.  Aortic valve peak gradient measures   42.2 mmHg. Aortic valve area, by VTI measures 0.83 cm.   Pulmonic Valve: The pulmonic valve was normal in structure. Pulmonic valve  regurgitation is trivial. No evidence of pulmonic stenosis.   Aorta: The aortic root is normal in size and structure.   Venous: The inferior vena cava is normal in size with greater than 50%  respiratory variability, suggesting right atrial pressure of 3 mmHg.   IAS/Shunts: No atrial level shunt detected by color flow Doppler.   Additional Comments: 3D was performed not requiring image post processing  on an independent workstation and was abnormal.     LEFT VENTRICLE  PLAX 2D  LVIDd:         5.60 cm         Diastology  LVIDs:         4.00 cm         LV e' medial:    5.43 cm/s  LV PW:         0.90 cm         LV E/e' medial:  10.2  LV IVS:        1.00 cm         LV e' lateral:   5.92 cm/s  LVOT diam:     2.00 cm         LV E/e' lateral: 9.4  LV SV:         67  LV SV Index:   33  LVOT Area:     3.14 cm        3D Volume EF                                 LV 3D EF:    Left                                              ventricul                                              ar                                              ejection                                              fraction                                              by 3D                                              volume is                                              50 %.                                   3D Volume EF:                                 3D EF:        50 %                                 LV EDV:       156 ml  LV ESV:       78 ml                                 LV SV:         78 ml   RIGHT VENTRICLE  RV Basal diam:  3.90 cm  RV Mid diam:    3.50 cm  RV S prime:     7.95 cm/s  TAPSE (M-mode): 1.6 cm   LEFT ATRIUM             Index        RIGHT ATRIUM           Index  LA diam:        3.90 cm 1.91 cm/m   RA Area:     13.80 cm  LA Vol (A2C):   28.0 ml 13.70 ml/m  RA Volume:   26.40 ml  12.92 ml/m  LA Vol (A4C):   33.9 ml 16.59 ml/m  LA Biplane Vol: 31.8 ml 15.56 ml/m   AORTIC VALVE  AV Area (Vmax):    0.94 cm  AV Area (Vmean):   0.90 cm  AV Area (VTI):     0.83 cm  AV Vmax:           325.00 cm/s  AV Vmean:          221.500 cm/s  AV VTI:            0.811 m  AV Peak Grad:      42.2 mmHg  AV Mean Grad:      25.0 mmHg  LVOT Vmax:         96.87 cm/s  LVOT Vmean:        63.567 cm/s  LVOT VTI:          0.213 m  LVOT/AV VTI ratio: 0.26  AI PHT:            485 msec    AORTA  Ao Root diam: 2.90 cm  Ao Asc diam:  3.30 cm   MITRAL VALVE  MV Area (PHT): 2.66 cm    SHUNTS  MV Decel Time: 285 msec    Systemic VTI:  0.21 m  MV E velocity: 55.50 cm/s  Systemic Diam: 2.00 cm  MV A velocity: 94.90 cm/s  MV E/A ratio:  0.58   Recent Labs: 12/20/2022: ALT 22; TSH 2.79 06/03/2023: BUN 46; Creatinine, Ser 1.75; Hemoglobin 13.4; Platelets 248; Potassium 4.8; Sodium 139   Lipid Panel    Component Value Date/Time   CHOL 121 12/20/2022 0930   TRIG 151.0 (H) 12/20/2022 0930   HDL 32.40 (L) 12/20/2022 0930   CHOLHDL 4 12/20/2022 0930   VLDL 30.2 12/20/2022 0930   LDLCALC 59 12/20/2022 0930   LDLDIRECT 78.4 11/02/2011 0936     Wt Readings from Last 3 Encounters:  06/03/23 200 lb (90.7 kg)  05/26/23 198 lb (89.8 kg)  05/24/23 202 lb (91.6 kg)     Assessment and Plan:   1. Severe Aortic Valve Stenosis: He has severe, stage D2 (paradoxical low flow/low gradient) aortic valve stenosis. He has NYHA class 3 symptoms. I have personally reviewed the echo images. The aortic valve is thickened and calcified with limited leaflet mobility. I think he would  benefit from AVR. Given advanced age and prior open heart surgery, he is not a good candidate for conventional AVR by surgical approach. I think he  may be a good candidate for TAVR.   I have reviewed the natural history of aortic stenosis with the patient and their family members  who are present today. We have discussed the limitations of medical therapy and the poor prognosis associated with symptomatic aortic stenosis. We have reviewed potential treatment options, including palliative medical therapy, conventional surgical aortic valve replacement, and transcatheter aortic valve replacement. We discussed treatment options in the context of the patient's specific comorbid medical conditions.   He would like to proceed with planning for TAVR. I will arrange a left heart catheterization at [facility] 06/17/23 at 10:30am. Risks and benefits of the cath procedure and the valve procedure are reviewed with the patient. After the cath, he will have a cardiac CT, CTA of the chest/abdomen and pelvis and will then be referred to see one of the CT surgeons on our TAVR team.   Increase Lasix  to 40 mg daily BMET and CBC today    Labs/ tests ordered today include:   Orders Placed This Encounter  Procedures   CBC   Basic metabolic panel with GFR   EKG [id number]   Disposition:   F/U will be arranged with the structural team  Signed, Antoinette Batman, MD, [facility] 06/04/2023 7:30 AM    [facility] [address] [place], [place], [place]  [address] Phone: [phone]; Fax: [phone]

## 2023-06-03 ENCOUNTER — Encounter: Payer: Self-pay | Admitting: Cardiovascular Disease

## 2023-06-03 ENCOUNTER — Ambulatory Visit: Payer: Medicare Other | Attending: Cardiovascular Disease | Admitting: Cardiovascular Disease

## 2023-06-03 VITALS — BP 136/66 | HR 79 | Ht 69.0 in | Wt 200.0 lb

## 2023-06-03 DIAGNOSIS — I350 Nonrheumatic aortic (valve) stenosis: Secondary | ICD-10-CM

## 2023-06-03 DIAGNOSIS — Z01810 Encounter for preprocedural cardiovascular examination: Secondary | ICD-10-CM | POA: Diagnosis not present

## 2023-06-03 DIAGNOSIS — Z01812 Encounter for preprocedural laboratory examination: Secondary | ICD-10-CM

## 2023-06-03 MED ORDER — FUROSEMIDE 40 MG PO TABS: 40.0000 mg | ORAL_TABLET | Freq: Every day | ORAL | 3 refills | Status: DC

## 2023-06-03 NOTE — Progress Notes (Addendum)
 Pre Surgical Assessment: 5 M Walk Test  74M=16.55ft  5 Meter Walk Test- trial 1: 5.31 seconds 5 Meter Walk Test- trial 2: 6.94 seconds 5 Meter Walk Test- trial 3: 5.49 seconds 5 Meter Walk Test Average: 5.91 seconds  _____________________   Procedure Type: Isolated AVR Perioperative Outcome Estimate % Operative Mortality 8.16% Morbidity & Mortality 18.9% Stroke 2.99% Renal Failure 4.91% Reoperation 4.18% Prolonged Ventilation 8.69% Deep Sternal Wound Infection 0.149% Long Hospital Stay (>14 days) 8.95% Short Hospital Stay (<6 days)* 27.1%

## 2023-06-03 NOTE — Patient Instructions (Addendum)
 Medication Instructions:  Your physician has recommended you make the following change in your medication:  1.) increase furosemide  (Lasix ) to 40 mg daily - new prescription at [facility]  *If you need a refill on your cardiac medications before your next appointment, please call your pharmacy*  Lab Work: Today: cbc, bmet - first floor lab today  Testing/Procedures: Your physician has requested that you have a cardiac catheterization. Cardiac catheterization is used to diagnose and/or treat various heart conditions. Doctors may recommend this procedure for a number of different reasons. The most common reason is to evaluate chest pain. Chest pain can be a symptom of coronary artery disease (CAD), and cardiac catheterization can show whether plaque is narrowing or blocking your heart's arteries. This procedure is also used to evaluate the valves, as well as measure the blood flow and oxygen levels in different parts of your heart. For further information please visit [email or web address] Please follow instruction sheet, as given.   Follow-Up:  Per Structural Heart Team        Cardiac/Peripheral Catheterization   You are scheduled for a Cardiac Catheterization on Friday, June 6 with Dr. Antoinette Batman.  1. Please arrive at the [facility] (Main Entrance A) at [facility]: [address] [place], [place] [address] at 8:30 AM (This time is TWO hour(s) before your procedure to ensure your preparation).   Free valet parking service is available. You will check in at ADMITTING. The support person will be asked to wait in the waiting room.  It is OK to have someone drop you off and come back when you are ready to be discharged.        Special note: Every effort is made to have your procedure done on time. Please understand that emergencies sometimes delay scheduled procedures.  2. Diet: Do not eat solid foods after midnight.  You may have clear liquids until 5 AM the day of the  procedure.  3. Labs: You will need to have blood drawn today.  You do not need to be fasting.  4. Medication instructions in preparation for your procedure:   Contrast Allergy: No  No Lasix  on day of procedure No telmisartan -hydrochlorothiazide  on day of procedure No Prandin  (repaglinide ) day of procedure No metformin  day of procedure or two days afterward - restart metformin  on Monday, June 9  On the morning of your procedure, take Aspirin  81 mg and any morning medicines NOT listed above.  You may use sips of water .  5. Plan to go home the same day, you will only stay overnight if medically necessary. 6. You MUST have a responsible adult to drive you home. 7. An adult MUST be with you the first 24 hours after you arrive home. 8. Bring a current list of your medications, and the last time and date medication taken. 9. Bring ID and current insurance cards. 10.Please wear clothes that are easy to get on and off and wear slip-on shoes.  Thank you for allowing us  to care for you!   -- [facility]

## 2023-06-04 LAB — BASIC METABOLIC PANEL WITH GFR
BUN/Creatinine Ratio: 26 — ABNORMAL HIGH (ref 10–24)
BUN: 46 mg/dL — ABNORMAL HIGH (ref 8–27)
CO2: 20 mmol/L (ref 20–29)
Calcium: 9.8 mg/dL (ref 8.6–10.2)
Chloride: 101 mmol/L (ref 96–106)
Creatinine, Ser: 1.75 mg/dL — ABNORMAL HIGH (ref 0.76–1.27)
Glucose: 135 mg/dL — ABNORMAL HIGH (ref 70–99)
Potassium: 4.8 mmol/L (ref 3.5–5.2)
Sodium: 139 mmol/L (ref 134–144)
eGFR: 37 mL/min/{1.73_m2} — ABNORMAL LOW (ref >59)

## 2023-06-04 LAB — CBC
Hematocrit: 41.1 % (ref 37.5–51.0)
Hemoglobin: 13.4 g/dL (ref 13.0–17.7)
MCH: 30.2 pg (ref 26.6–33.0)
MCHC: 32.6 g/dL (ref 31.5–35.7)
MCV: 93 fL (ref 79–97)
Platelets: 248 10*3/uL (ref 150–450)
RBC: 4.43 x10E6/uL (ref 4.14–5.80)
RDW: 12.6 % (ref 11.6–15.4)
WBC: 10.8 10*3/uL (ref 3.4–10.8)

## 2023-06-07 ENCOUNTER — Ambulatory Visit: Payer: Self-pay | Admitting: Cardiovascular Disease

## 2023-06-08 DIAGNOSIS — C678 Malignant neoplasm of overlapping sites of bladder: Secondary | ICD-10-CM | POA: Diagnosis not present

## 2023-06-09 ENCOUNTER — Telehealth: Payer: Self-pay | Admitting: *Deleted

## 2023-06-09 NOTE — Telephone Encounter (Signed)
 Call placed to patient to discuss earlier arrival time 06/17/23 for hydration prior to procedure.  Cardiac Catheterization scheduled at [facility] for: Friday June 17, 2023 10:30 AM Arrival time [facility] Main Entrance A at: 5:30 AM-pre-procedure hydration  Nothing to eat after midnight prior to procedure, clear liquids until 5 AM day of procedure.  Medication instructions: -Hold:  Micardis /Lasix -day before and day of cath-per protocol GFR <60 (37)  Metformin -day of procedure and 48 hours post procedure   Prandin -AM of procedure  -Other usual morning medications can be taken with sips of water  including aspirin  81 mg.  Plan to go home the same day, you will only stay overnight if medically necessary.  You must have responsible adult to drive you home.  Someone must be with you the first 24 hours after you arrive home.  Reviewed procedure instructions /early arrival for hydration with patient.

## 2023-06-10 ENCOUNTER — Other Ambulatory Visit: Payer: Self-pay | Admitting: Internal Medicine

## 2023-06-10 ENCOUNTER — Telehealth: Payer: Self-pay | Admitting: Cardiovascular Disease

## 2023-06-10 NOTE — Telephone Encounter (Signed)
 Spoke with Burdette Carolin per DPR and she is aware patient will need to be monitored for the first 24 hours at home. She should follow discharge instructions following cath. She verbalized understanding

## 2023-06-10 NOTE — Telephone Encounter (Signed)
 Patient's daughter says they have been invited to an event on 6/07. She would like to know if patient will be alright to attend event after 6/06 heart cath, or if he will need rest. Please advise.

## 2023-06-12 DIAGNOSIS — G959 Disease of spinal cord, unspecified: Secondary | ICD-10-CM | POA: Insufficient documentation

## 2023-06-12 DIAGNOSIS — R2243 Localized swelling, mass and lump, lower limb, bilateral: Secondary | ICD-10-CM | POA: Insufficient documentation

## 2023-06-12 DIAGNOSIS — M5126 Other intervertebral disc displacement, lumbar region: Secondary | ICD-10-CM | POA: Insufficient documentation

## 2023-06-12 DIAGNOSIS — M21379 Foot drop, unspecified foot: Secondary | ICD-10-CM | POA: Insufficient documentation

## 2023-06-14 ENCOUNTER — Encounter: Payer: Self-pay | Admitting: Internal Medicine

## 2023-06-14 ENCOUNTER — Ambulatory Visit (INDEPENDENT_AMBULATORY_CARE_PROVIDER_SITE_OTHER): Admitting: Internal Medicine

## 2023-06-14 VITALS — BP 118/68 | HR 62 | Temp 98.3°F | Ht 69.0 in | Wt 196.0 lb

## 2023-06-14 DIAGNOSIS — I350 Nonrheumatic aortic (valve) stenosis: Secondary | ICD-10-CM | POA: Diagnosis not present

## 2023-06-14 DIAGNOSIS — N1831 Chronic kidney disease, stage 3a: Secondary | ICD-10-CM | POA: Diagnosis not present

## 2023-06-14 DIAGNOSIS — Z7984 Long term (current) use of oral hypoglycemic drugs: Secondary | ICD-10-CM | POA: Diagnosis not present

## 2023-06-14 DIAGNOSIS — R269 Unspecified abnormalities of gait and mobility: Secondary | ICD-10-CM | POA: Diagnosis not present

## 2023-06-14 DIAGNOSIS — E785 Hyperlipidemia, unspecified: Secondary | ICD-10-CM | POA: Diagnosis not present

## 2023-06-14 DIAGNOSIS — G8929 Other chronic pain: Secondary | ICD-10-CM | POA: Diagnosis not present

## 2023-06-14 DIAGNOSIS — I2510 Atherosclerotic heart disease of native coronary artery without angina pectoris: Secondary | ICD-10-CM

## 2023-06-14 DIAGNOSIS — I10 Essential (primary) hypertension: Secondary | ICD-10-CM

## 2023-06-14 DIAGNOSIS — M5440 Lumbago with sciatica, unspecified side: Secondary | ICD-10-CM | POA: Diagnosis not present

## 2023-06-14 DIAGNOSIS — C678 Malignant neoplasm of overlapping sites of bladder: Secondary | ICD-10-CM | POA: Diagnosis not present

## 2023-06-14 DIAGNOSIS — E1122 Type 2 diabetes mellitus with diabetic chronic kidney disease: Secondary | ICD-10-CM | POA: Diagnosis not present

## 2023-06-14 MED ORDER — B COMPLEX VITAMINS PO CAPS: 1.0000 | ORAL_CAPSULE | Freq: Every day | ORAL | Status: AC

## 2023-06-14 NOTE — Assessment & Plan Note (Addendum)
 No CP ASA, Amlodipine , Pravastatin  Heart Cath is pending - Dr Abel Hoe, TAVR is being planned

## 2023-06-14 NOTE — Assessment & Plan Note (Signed)
 Due to spinal stenosis Start silver sneakers, gym hopefully

## 2023-06-14 NOTE — Patient Instructions (Signed)
 [employer] [id number] Nitro Euro-Style 4-Wheel Rollator Environmental consultant With Seat  - [employer]

## 2023-06-14 NOTE — Assessment & Plan Note (Signed)
F/u w/Urology 

## 2023-06-14 NOTE — Assessment & Plan Note (Signed)
Cont on Telmisartan-HCT, Amlodipine  5 mg/d

## 2023-06-14 NOTE — Assessment & Plan Note (Signed)
 Chronic sx's

## 2023-06-14 NOTE — Assessment & Plan Note (Signed)
Cont on Prandin, Metformin ?

## 2023-06-14 NOTE — Progress Notes (Signed)
 Subjective:  Patient ID: Jeff Burgess, male    DOB: July 27, 1935  Age: 88 y.o. MRN: [id number]  CC: Annual Exam   HPI YAN PANKRATZ presents for CAD, HTN, DM and AS Due heart cath on Fri Here w/dtr Burdette Carolin   Outpatient Medications Prior to Visit  Medication Sig Dispense Refill   amLODipine  (NORVASC ) 5 MG tablet TAKE 1 TABLET BY MOUTH DAILY 180 tablet 3   aspirin  81 MG tablet Take 2 tablets (162 mg total) by mouth daily. (Patient taking differently: Take 81 mg by mouth daily.) 100 tablet 3   cholecalciferol  (VITAMIN D ) 25 MCG (1000 UT) tablet Take 1 tablet (1,000 Units total) by mouth every morning. Follow-up due in April must see provider for refills (Patient taking differently: Take 1,000 Units by mouth every morning.) 90 tablet 0   CINNAMON PO Take 3,000 mg by mouth daily. With cereal     furosemide  (LASIX ) 40 MG tablet Take 1 tablet (40 mg total) by mouth daily. 90 tablet 3   gabapentin  (NEURONTIN ) 300 MG capsule Take 300-600 mg by mouth at bedtime as needed (pinched nerve pain.).     isosorbide  mononitrate (IMDUR ) 30 MG 24 hr tablet TAKE 1 TABLET BY MOUTH DAILY 100 tablet 2   metFORMIN  (GLUCOPHAGE -XR) 500 MG 24 hr tablet TAKE 1 TABLET BY MOUTH TWICE  DAILY 200 tablet 2   repaglinide  (PRANDIN ) 1 MG tablet TAKE 1 TABLET (1 MG TOTAL) BY MOUTH 3 (THREE) TIMES DAILY BEFORE MEALS. 300 tablet 3   telmisartan -hydrochlorothiazide  (MICARDIS  HCT) 80-25 MG tablet TAKE 1 TABLET BY MOUTH DAILY 90 tablet 3   TYLENOL  500 MG tablet Take 500 mg by mouth as needed for moderate pain.     ZYPITAMAG  4 MG TABS TAKE 1 TABLET BY MOUTH EVERY DAY (Patient not taking: Reported on 06/09/2023) 90 tablet 3   No facility-administered medications prior to visit.    ROS: Review of Systems  Constitutional:  Negative for appetite change, fatigue and unexpected weight change.  HENT:  Negative for congestion, nosebleeds, sneezing, sore throat and trouble swallowing.   Eyes:  Negative for itching and visual  disturbance.  Respiratory:  Negative for cough.   Cardiovascular:  Negative for chest pain, palpitations and leg swelling.  Gastrointestinal:  Negative for abdominal distention, blood in stool, diarrhea and nausea.  Genitourinary:  Negative for frequency and hematuria.  Musculoskeletal:  Negative for back pain, gait problem, joint swelling and neck pain.  Skin:  Negative for rash.  Neurological:  Negative for dizziness, tremors, speech difficulty and weakness.  Psychiatric/Behavioral:  Negative for agitation, dysphoric mood and sleep disturbance. The patient is not nervous/anxious.     Objective:  BP 118/68   Pulse 62   Temp 98.3 F (36.8 C) (Oral)   Ht 5\' 9"  (1.753 m)   Wt 196 lb (88.9 kg)   SpO2 90%   BMI 28.94 kg/m   BP Readings from Last 3 Encounters:  06/14/23 118/68  06/03/23 136/66  05/26/23 120/60    Wt Readings from Last 3 Encounters:  06/14/23 196 lb (88.9 kg)  06/03/23 200 lb (90.7 kg)  05/26/23 198 lb (89.8 kg)    Physical Exam Constitutional:      General: He is not in acute distress.    Appearance: He is well-developed.     Comments: NAD  Eyes:     Conjunctiva/sclera: Conjunctivae normal.     Pupils: Pupils are equal, round, and reactive to light.  Neck:     Thyroid :  No thyromegaly.     Vascular: No JVD.  Cardiovascular:     Rate and Rhythm: Normal rate and regular rhythm.     Heart sounds: Murmur heard.     No friction rub. No gallop.  Pulmonary:     Effort: Pulmonary effort is normal. No respiratory distress.     Breath sounds: Normal breath sounds. No wheezing or rales.  Chest:     Chest wall: No tenderness.  Abdominal:     General: Bowel sounds are normal. There is no distension.     Palpations: Abdomen is soft. There is no mass.     Tenderness: There is no abdominal tenderness. There is no guarding or rebound.  Musculoskeletal:        General: No tenderness. Normal range of motion.     Cervical back: Normal range of motion.     Right  lower leg: No edema.     Left lower leg: No edema.  Lymphadenopathy:     Cervical: No cervical adenopathy.  Skin:    General: Skin is warm and dry.     Findings: No rash.  Neurological:     Mental Status: He is alert and oriented to person, place, and time.     Cranial Nerves: No cranial nerve deficit.     Motor: No abnormal muscle tone.     Coordination: Coordination normal.     Gait: Gait abnormal.     Deep Tendon Reflexes: Reflexes are normal and symmetric.  Psychiatric:        Behavior: Behavior normal.        Thought Content: Thought content normal.        Judgment: Judgment normal.     Lab Results  Component Value Date   WBC 10.8 06/03/2023   HGB 13.4 06/03/2023   HCT 41.1 06/03/2023   PLT 248 06/03/2023   GLUCOSE 135 (H) 06/03/2023   CHOL 121 12/20/2022   TRIG 151.0 (H) 12/20/2022   HDL 32.40 (L) 12/20/2022   LDLDIRECT 78.4 11/02/2011   LDLCALC 59 12/20/2022   ALT 22 12/20/2022   AST 20 12/20/2022   NA 139 06/03/2023   K 4.8 06/03/2023   CL 101 06/03/2023   CREATININE 1.75 (H) 06/03/2023   BUN 46 (H) 06/03/2023   CO2 20 06/03/2023   TSH 2.79 12/20/2022   PSA 2.67 01/22/2021   INR 1.1 01/14/2022   HGBA1C 7.8 (H) 02/18/2023    MR LUMBAR SPINE WO CONTRAST Result Date: 05/10/2023 CLINICAL DATA:  88 year old male with persistent back pain. Numbness in the legs. Prior surgery. EXAM: MRI LUMBAR SPINE WITHOUT CONTRAST TECHNIQUE: Multiplanar, multisequence MR imaging of the lumbar spine was performed. No intravenous contrast was administered. COMPARISON:  Lumbar radiographs 08/30/2019.  Lumbar MRI 05/29/2019. FINDINGS: Segmentation:  Normal on the comparison radiographs. Alignment: Lumbar lordosis has not significantly changed since 2021. Mild chronic retrolisthesis of L5 on S1. Subtle levoconvex lumbar scoliosis. Vertebrae: Mild hardware susceptibility artifact now at L4-L5 and L5-S1. Normal background bone marrow signal. Maintained vertebral height. Intact visible  sacrum and SI joints. No marrow edema or evidence of acute osseous abnormality. Conus medullaris and cauda equina: Conus extends to the T12-L1 level. No lower spinal cord or conus signal abnormality. Paraspinal and other soft tissues: Postoperative changes from L4 to the sacrum, see additional details below. Renal cortical volume loss. Otherwise negative visible abdominal viscera. Disc levels: Visible lower thoracic levels appear negative for age. L1-L2: Minimal disc bulge. Moderate to severe ligament flavum  hypertrophy with new subligamentous synovial cysts on the left since 2021 (series 106, image 11). Mild facet hypertrophy. No spinal stenosis. Mild left, mild to moderate right L1 foraminal stenosis. L2-L3: Similar posterior element hypertrophy with minimal disc bulging. No significant stenosis. L3-L4: Chronic circumferential disc bulge with a broad-based posterior component. Moderate to severe ligament flavum hypertrophy (series 106, image 24). Mild to moderate facet hypertrophy. Moderate to severe spinal stenosis (same image). Lateral recesses less affected. Mild to moderate bilateral L3 foraminal stenosis. L4-L5: Chronic decompression and fusion. Residual bulky endplate spurring including posteriorly in the midline on series 106, image 29. No significant spinal stenosis following decompression here. Lateral recess patency also appears improved compared to 2021. Moderate to severe left, moderate right L4 foraminal stenosis also appears regressed compared to 2021. L5-S1: Decompression and fusion with similar chronic disc space loss and bulky circumferential disc osteophyte complex. No spinal or lateral recess stenosis here. Bilateral L5 neural foramina partially obscured by hardware artifact. Moderate to severe left L5 foraminal stenosis previously, mild on the right. IMPRESSION: 1. Prior decompression and fusion at L4-L5 and L5-S1 with no residual spinal stenosis. Lateral recess patency appears improved at both  levels, and foraminal patency appears improved at L4-L5. 2. Adjacent segment disease at L3-L4 with moderate to severe multifactorial spinal stenosis, progressed since 2021. 3. Upper lumbar posterior element hypertrophy, mild to moderate neural foraminal stenosis. Electronically Signed   By: Marlise Simpers M.D.   On: 05/10/2023 08:39    Assessment & Plan:   Problem List Items Addressed This Visit     Dyslipidemia   Heart Cath is pending - Dr Abel Hoe, TAVR is being planned      Essential hypertension - Primary   Cont on Telmisartan -HCT, Amlodipine   5 mg/d      CAD (coronary artery disease)   No CP ASA, Amlodipine , Pravastatin  Heart Cath is pending - Dr Abel Hoe, TAVR is being planned      LOW BACK PAIN   Due to spinal stenosis Start silver sneakers, gym hopefully       DM (diabetes mellitus), type 2 with renal complications (HCC)   Cont on Prandin , Metformin       Bladder cancer (HCC)   F/u w/Urology      Gait disorder   Chronic sx's      Aortic stenosis   Heart Cath is pending - Dr Abel Hoe, TAVR is being planned         Meds ordered this encounter  Medications   b complex vitamins capsule    Sig: Take 1 capsule by mouth daily.      Follow-up: Return in about 3 months (around 09/14/2023) for a follow-up visit.  Anitra Barn, MD

## 2023-06-14 NOTE — Assessment & Plan Note (Signed)
 Heart Cath is pending - Dr Abel Hoe, TAVR is being planned

## 2023-06-15 DIAGNOSIS — C678 Malignant neoplasm of overlapping sites of bladder: Secondary | ICD-10-CM | POA: Diagnosis not present

## 2023-06-15 DIAGNOSIS — Z5111 Encounter for antineoplastic chemotherapy: Secondary | ICD-10-CM | POA: Diagnosis not present

## 2023-06-15 NOTE — Telephone Encounter (Signed)
 Reviewed procedure instructions /pre-procedure hydration with patient.

## 2023-06-17 ENCOUNTER — Encounter (HOSPITAL_COMMUNITY)
Admission: RE | Disposition: A | Discharge status: Home / Self Care | Payer: Self-pay | Source: Home / Self Care | Attending: Cardiovascular Disease

## 2023-06-17 ENCOUNTER — Ambulatory Visit (HOSPITAL_COMMUNITY)
Admission: RE | Admit: 2023-06-17 | Discharge: 2023-06-17 | Disposition: A | Discharge status: Home / Self Care | Attending: Cardiovascular Disease | Admitting: Cardiovascular Disease

## 2023-06-17 ENCOUNTER — Other Ambulatory Visit (HOSPITAL_COMMUNITY): Payer: Self-pay

## 2023-06-17 ENCOUNTER — Other Ambulatory Visit: Payer: Self-pay

## 2023-06-17 DIAGNOSIS — I25110 Atherosclerotic heart disease of native coronary artery with unstable angina pectoris: Secondary | ICD-10-CM | POA: Diagnosis not present

## 2023-06-17 DIAGNOSIS — Z79899 Other long term (current) drug therapy: Secondary | ICD-10-CM | POA: Diagnosis not present

## 2023-06-17 DIAGNOSIS — I2582 Chronic total occlusion of coronary artery: Secondary | ICD-10-CM | POA: Diagnosis not present

## 2023-06-17 DIAGNOSIS — Z87891 Personal history of nicotine dependence: Secondary | ICD-10-CM | POA: Insufficient documentation

## 2023-06-17 DIAGNOSIS — I350 Nonrheumatic aortic (valve) stenosis: Secondary | ICD-10-CM

## 2023-06-17 DIAGNOSIS — Z955 Presence of coronary angioplasty implant and graft: Secondary | ICD-10-CM

## 2023-06-17 DIAGNOSIS — I2510 Atherosclerotic heart disease of native coronary artery without angina pectoris: Secondary | ICD-10-CM | POA: Diagnosis present

## 2023-06-17 HISTORY — PX: CORONARY/GRAFT ANGIOGRAPHY: CATH118237

## 2023-06-17 HISTORY — PX: CORONARY STENT INTERVENTION: CATH118234

## 2023-06-17 LAB — GLUCOSE, CAPILLARY: Glucose-Capillary: 117 mg/dL — ABNORMAL HIGH (ref 70–99)

## 2023-06-17 LAB — POCT ACTIVATED CLOTTING TIME: Activated Clotting Time: 291 s

## 2023-06-17 SURGERY — CORONARY/GRAFT ANGIOGRAPHY
Anesthesia: LOCAL

## 2023-06-17 MED ORDER — NITROGLYCERIN 0.4 MG SL SUBL
0.4000 mg | SUBLINGUAL_TABLET | SUBLINGUAL | 2 refills | Status: AC | PRN
  Filled 2023-06-17: qty 25, 7d supply, fill #0

## 2023-06-17 MED ORDER — FENTANYL CITRATE (PF) 100 MCG/2ML IJ SOLN
INTRAMUSCULAR | Status: DC | PRN
  Administered 2023-06-17: 25 ug via INTRAVENOUS

## 2023-06-17 MED ORDER — SODIUM CHLORIDE 0.9 % WEIGHT BASED INFUSION
3.0000 mL/kg/h | INTRAVENOUS | Status: DC
  Administered 2023-06-17: 3 mL/kg/h via INTRAVENOUS

## 2023-06-17 MED ORDER — VERAPAMIL HCL 2.5 MG/ML IV SOLN
INTRAVENOUS | Status: AC
  Filled 2023-06-17: qty 2

## 2023-06-17 MED ORDER — FUROSEMIDE 20 MG PO TABS: 40.0000 mg | ORAL_TABLET | Freq: Every day | ORAL | Status: DC

## 2023-06-17 MED ORDER — ASPIRIN 81 MG PO CHEW: 81.0000 mg | CHEWABLE_TABLET | ORAL | Status: DC

## 2023-06-17 MED ORDER — MIDAZOLAM HCL 2 MG/2ML IJ SOLN
INTRAMUSCULAR | Status: DC | PRN
Start: 2023-06-17 — End: 2023-06-17
  Administered 2023-06-17: 1 mg via INTRAVENOUS

## 2023-06-17 MED ORDER — HEPARIN SODIUM (PORCINE) 1000 UNIT/ML IJ SOLN
INTRAMUSCULAR | Status: DC | PRN
Start: 2023-06-17 — End: 2023-06-17
  Administered 2023-06-17: 6000 [IU] via INTRAVENOUS
  Administered 2023-06-17: 4000 [IU] via INTRAVENOUS

## 2023-06-17 MED ORDER — FAMOTIDINE IN NACL 20-0.9 MG/50ML-% IV SOLN
INTRAVENOUS | Status: AC
  Filled 2023-06-17: qty 50

## 2023-06-17 MED ORDER — CLOPIDOGREL BISULFATE 75 MG PO TABS
75.0000 mg | ORAL_TABLET | Freq: Every day | ORAL | 2 refills | Status: DC
  Filled 2023-06-17: qty 90, 90d supply, fill #0

## 2023-06-17 MED ORDER — HEPARIN (PORCINE) IN NACL 1000-0.9 UT/500ML-% IV SOLN
INTRAVENOUS | Status: DC | PRN
  Administered 2023-06-17 (×2): 500 mL

## 2023-06-17 MED ORDER — LIDOCAINE HCL (PF) 1 % IJ SOLN
INTRAMUSCULAR | Status: DC | PRN
  Administered 2023-06-17: 2 mL

## 2023-06-17 MED ORDER — CLOPIDOGREL BISULFATE 300 MG PO TABS
ORAL_TABLET | ORAL | Status: DC | PRN
  Administered 2023-06-17: 600 mg via ORAL

## 2023-06-17 MED ORDER — CLOPIDOGREL BISULFATE 75 MG PO TABS: 75.0000 mg | ORAL_TABLET | Freq: Every day | ORAL | Status: DC

## 2023-06-17 MED ORDER — LIDOCAINE HCL (PF) 1 % IJ SOLN
INTRAMUSCULAR | Status: AC
Start: 2023-06-17 — End: ?
  Filled 2023-06-17: qty 30

## 2023-06-17 MED ORDER — FAMOTIDINE IN NACL 20-0.9 MG/50ML-% IV SOLN
INTRAVENOUS | Status: DC | PRN
  Administered 2023-06-17: 20 mg via INTRAVENOUS

## 2023-06-17 MED ORDER — SODIUM CHLORIDE 0.9 % IV SOLN: INTRAVENOUS | Status: AC

## 2023-06-17 MED ORDER — MIDAZOLAM HCL 2 MG/2ML IJ SOLN
INTRAMUSCULAR | Status: AC
  Filled 2023-06-17: qty 2

## 2023-06-17 MED ORDER — ASPIRIN 81 MG PO TABS: 81.0000 mg | ORAL_TABLET | Freq: Every day | ORAL | Status: DC

## 2023-06-17 MED ORDER — HEPARIN SODIUM (PORCINE) 1000 UNIT/ML IJ SOLN
INTRAMUSCULAR | Status: AC
  Filled 2023-06-17: qty 10

## 2023-06-17 MED ORDER — FENTANYL CITRATE (PF) 100 MCG/2ML IJ SOLN
INTRAMUSCULAR | Status: AC
  Filled 2023-06-17: qty 2

## 2023-06-17 MED ORDER — SODIUM CHLORIDE 0.9% FLUSH: 3.0000 mL | INTRAVENOUS | Status: DC | PRN

## 2023-06-17 MED ORDER — REPAGLINIDE 1 MG PO TABS: 1.0000 mg | ORAL_TABLET | Freq: Three times a day (TID) | ORAL | Status: DC

## 2023-06-17 MED ORDER — CLOPIDOGREL BISULFATE 300 MG PO TABS
ORAL_TABLET | ORAL | Status: AC
  Filled 2023-06-17: qty 2

## 2023-06-17 MED ORDER — TELMISARTAN-HCTZ 80-25 MG PO TABS: 1.0000 | ORAL_TABLET | Freq: Every day | ORAL | Status: DC

## 2023-06-17 MED ORDER — IOHEXOL 350 MG/ML SOLN
INTRAVENOUS | Status: DC | PRN
  Administered 2023-06-17: 95 mL

## 2023-06-17 MED ORDER — ONDANSETRON HCL 4 MG/2ML IJ SOLN: 4.0000 mg | Freq: Four times a day (QID) | INTRAMUSCULAR | Status: DC | PRN

## 2023-06-17 MED ORDER — SODIUM CHLORIDE 0.9% FLUSH: 3.0000 mL | Freq: Two times a day (BID) | INTRAVENOUS | Status: DC

## 2023-06-17 MED ORDER — SODIUM CHLORIDE 0.9 % IV SOLN: 250.0000 mL | INTRAVENOUS | Status: DC | PRN

## 2023-06-17 MED ORDER — ACETAMINOPHEN 325 MG PO TABS: 650.0000 mg | ORAL_TABLET | ORAL | Status: DC | PRN

## 2023-06-17 MED ORDER — VERAPAMIL HCL 2.5 MG/ML IV SOLN
INTRAVENOUS | Status: DC | PRN
  Administered 2023-06-17: 10 mL via INTRA_ARTERIAL

## 2023-06-17 MED ORDER — AMLODIPINE BESYLATE 5 MG PO TABS: 5.0000 mg | ORAL_TABLET | Freq: Every day | ORAL | Status: DC

## 2023-06-17 MED ORDER — SODIUM CHLORIDE 0.9 % WEIGHT BASED INFUSION: 1.0000 mL/kg/h | INTRAVENOUS | Status: DC

## 2023-06-17 MED ORDER — ISOSORBIDE MONONITRATE ER 30 MG PO TB24: 30.0000 mg | ORAL_TABLET | Freq: Every day | ORAL | Status: DC

## 2023-06-17 MED ORDER — HYDRALAZINE HCL 20 MG/ML IJ SOLN: 10.0000 mg | INTRAMUSCULAR | Status: DC | PRN

## 2023-06-17 SURGICAL SUPPLY — 14 items
BALLOON EMERGE MR 2.0X12 (BALLOONS) IMPLANT
CATH INFINITI 5 FR IM (CATHETERS) IMPLANT
CATH INFINITI 5FR MULTPACK ANG (CATHETERS) IMPLANT
CATH VISTA GUIDE 6FR XBLD 3.5 (CATHETERS) IMPLANT
DEVICE RAD COMP TR BAND LRG (VASCULAR PRODUCTS) IMPLANT
GLIDESHEATH SLEND SS 6F .021 (SHEATH) IMPLANT
GUIDEWIRE INQWIRE 1.5J.035X260 (WIRE) IMPLANT
KIT ENCORE 26 ADVANTAGE (KITS) IMPLANT
KIT HEART LEFT (KITS) IMPLANT
PACK CARDIAC CATHETERIZATION (CUSTOM PROCEDURE TRAY) ×1 IMPLANT
STENT SYNERGY XD 2.75X16 (Permanent Stent) IMPLANT
TRANSDUCER W/STOPCOCK (MISCELLANEOUS) IMPLANT
TUBING CIL FLEX 10 FLL-RA (TUBING) IMPLANT
WIRE ASAHI PROWATER 180CM (WIRE) IMPLANT

## 2023-06-17 NOTE — Interval H&P Note (Signed)
 History and Physical Interval Note:  06/17/2023 10:01 AM  Jeff Burgess  has presented today for surgery, with the diagnosis of aortic stenosis.  The various methods of treatment have been discussed with the patient and family. After consideration of risks, benefits and other options for treatment, the patient has consented to  Procedure(s): LEFT HEART CATH AND CORS/GRAFTS ANGIOGRAPHY (N/A) as a surgical intervention.  The patient's history has been reviewed, patient examined, no change in status, stable for surgery.  I have reviewed the patient's chart and labs.  Questions were answered to the patient's satisfaction.    Cath Lab Visit (complete for each Cath Lab visit)  Clinical Evaluation Leading to the Procedure:   ACS: No.  Non-ACS:    Anginal Classification: CCS II  Anti-ischemic medical therapy: Maximal Therapy (2 or more classes of medications)  Non-Invasive Test Results: No non-invasive testing performed  Prior CABG: Previous CABG    Antoinette Batman

## 2023-06-17 NOTE — Progress Notes (Signed)
 CARDIAC REHAB PHASE I     Post stent education including site care, restrictions, risk factors, exercise guidelines, NTG use, antiplatelet therapy importance, heart healthy diabetic diet and CRP2 reviewed. All questions and concerns addressed. Will refer to [facility] for CRP2. TAVR workup ongoing. Plan for home later today.    [id number] Ronny Colas, RN BSN 06/17/2023 3:04 PM

## 2023-06-17 NOTE — Discharge Summary (Signed)
 Discharge Summary for Same Day PCI   Patient ID: Jeff Burgess MRN: [id number]; DOB: 10/28/1935  Admit date: 06/17/2023 Discharge date: 06/17/2023  Primary Care Provider: Genia Kettering, MD  Primary Cardiologist: Antoinette Batman, MD  Primary Electrophysiologist:  None   Discharge Diagnoses    Active Problems:   CAD (coronary artery disease)  Diagnostic Studies/Procedures    Cardiac Catheterization 06/17/2023:    Prox RCA lesion is 50% stenosed.   RPDA lesion is 70% stenosed.   Ost LAD to Prox LAD lesion is 100% stenosed.   Mid Cx lesion is 99% stenosed.   Prox Cx lesion is 50% stenosed.   A drug-eluting stent was successfully placed using a STENT SYNERGY XD 2.75X16.   Post intervention, there is a 0% residual stenosis.   LIMA graft was visualized by angiography and is normal in caliber.   Chronic occlusion of the ostial. Patent LIMA to LAD which fills the entire LAD Large caliber Circumflex with moderate proximal stenosis and severe mid stenosis Successful PTCA/DES x 1 mid Circumflex Large dominant RCA with moderate mid vessel stenosis. Diffuse severe disease in the small caliber PDA   Recommendations: Continue DAPT with ASA and Plavix for at least six months. Continue workup for TAVR  Diagnostic Dominance: Right  Intervention   _____________   History of Present Illness     Jeff Burgess is a 88 y.o. male with history of CAD s/p 1V CABG in 1998, carotid artery disease, PAD, HTN, HLD, CKD stage 3, DM, bladder cancer and chronic RBBB who was seen in the clinic with Dr. Abel Hoe on 5/23. He was [facility] to [facility] in January 2024 with chest pain, mild troponin elevation. He was started on heparin  but developed hematuria so no cardiac cath was performed. He was not started on Plavix due to bleeding risk and was not started on a beta blocker due to prior intolerance. Echo 02/02/22 with LVEF=55%. Mild MR. Moderate aortic stenosis with mean gradient 23 mmHg, DI 0.35,  AVA 1.2 cm2. Echo February 2025 with LVEF=50-55%. Low flow/low gradient severe aortic stenosis with mean gradient 25 mmHg, AVA 0.83 cm2, SVI 33, DI 0.26. Nuclear stress test March 2025 with no ischemia.    Reported in the office he had been having progressive dyspnea with exertion and fatigue. He has been very active until the past few months and now sits in his chair all day due to fatigue. He had mild chest pressure with exertion. He lives in [place], [place] with his wife. He is retired from Social [employer]. He has full dentures. As part of his TAVR work up a cardiac catheterization was arranged for further evaluation.  Hospital Course     The patient underwent cardiac cath as noted above with chronic occlusion of oLAD, patent LIMA-LAD, 99% mLcx treated with PCI/DESx1. Plan for DAPT with ASA/plavix for at least 6 months. The patient was seen by cardiac rehab while in short stay. There were no observed complications post cath. Radial cath site was re-evaluated prior to discharge and found to be stable without any complications. Instructions/precautions regarding cath site care were given prior to discharge.  Jeff Burgess was seen by Dr. Abel Hoe and determined stable for discharge home. Follow up with our office has been arranged. Medications are listed below. Pertinent changes include addition of plavix and SL NTG. _____________  Cath/PCI Registry Performance & Quality Measures: Aspirin  prescribed? - Yes ADP Receptor Inhibitor (Plavix/Clopidogrel, Brilinta/Ticagrelor or Effient/Prasugrel) prescribed (includes medically managed patients)? - Yes  High Intensity Statin (Lipitor 40-80mg  or Crestor 20-40mg ) prescribed? - No - intolerant For EF <40%, was ACEI/ARB prescribed? - Not Applicable (EF >/= 40%) For EF <40%, Aldosterone Antagonist (Spironolactone or Eplerenone) prescribed? - Not Applicable (EF >/= 40%) Cardiac Rehab Phase II ordered (Included Medically managed Patients)? -  Yes  _____________   Discharge Vitals Blood pressure 128/68, pulse 75, temperature 97.8 F (36.6 C), temperature source Oral, resp. rate 16, height 5\' 9"  (1.753 m), weight 86.2 kg, SpO2 95%.  Filed Weights   06/17/23 0550  Weight: 86.2 kg    Last Labs & Radiologic Studies    CBC No results for input(s): "WBC", "NEUTROABS", "HGB", "HCT", "MCV", "PLT" in the last 72 hours. Basic Metabolic Panel No results for input(s): "NA", "K", "CL", "CO2", "GLUCOSE", "BUN", "CREATININE", "CALCIUM ", "MG", "PHOS" in the last 72 hours. Liver Function Tests No results for input(s): "AST", "ALT", "ALKPHOS", "BILITOT", "PROT", "ALBUMIN " in the last 72 hours. No results for input(s): "LIPASE", "AMYLASE" in the last 72 hours. High Sensitivity Troponin:   No results for input(s): "TROPONINIHS" in the last 720 hours.  BNP Invalid input(s): "POCBNP" D-Dimer No results for input(s): "DDIMER" in the last 72 hours. Hemoglobin A1C No results for input(s): "HGBA1C" in the last 72 hours. Fasting Lipid Panel No results for input(s): "CHOL", "HDL", "LDLCALC", "TRIG", "CHOLHDL", "LDLDIRECT" in the last 72 hours. Thyroid  Function Tests No results for input(s): "TSH", "T4TOTAL", "T3FREE", "THYROIDAB" in the last 72 hours.  Invalid input(s): "FREET3" _____________  CARDIAC CATHETERIZATION Result Date: 06/17/2023   Prox RCA lesion is 50% stenosed.   RPDA lesion is 70% stenosed.   Ost LAD to Prox LAD lesion is 100% stenosed.   Mid Cx lesion is 99% stenosed.   Prox Cx lesion is 50% stenosed.   A drug-eluting stent was successfully placed using a STENT SYNERGY XD 2.75X16.   Post intervention, there is a 0% residual stenosis.   LIMA graft was visualized by angiography and is normal in caliber. Chronic occlusion of the ostial. Patent LIMA to LAD which fills the entire LAD Large caliber Circumflex with moderate proximal stenosis and severe mid stenosis Successful PTCA/DES x 1 mid Circumflex Large dominant RCA with moderate  mid vessel stenosis. Diffuse severe disease in the small caliber PDA Recommendations: Continue DAPT with ASA and Plavix for at least six months. Continue workup for TAVR    Disposition   Pt is being discharged home today in good condition.  Follow-up Plans & Appointments        Discharge Medications   Allergies as of 06/17/2023       Reactions   Atorvastatin  Other (See Comments)   Weak legs   Coreg  [carvedilol ] Other (See Comments)   weakness   Lovastatin  Other (See Comments)   Leg weakness   Morphine Sulfate Other (See Comments)   Per the pt: "After taking morphine, within 30 mins, I was soaking wet. I did not like the way it made me feel."        Medication List     TAKE these medications    amLODipine  5 MG tablet Commonly known as: NORVASC  TAKE 1 TABLET BY MOUTH DAILY   aspirin  81 MG tablet Take 2 tablets (162 mg total) by mouth daily. What changed: how much to take   b complex vitamins capsule Take 1 capsule by mouth daily.   cholecalciferol  25 MCG (1000 UNIT) tablet Commonly known as: VITAMIN D3 Take 1 tablet (1,000 Units total) by mouth every morning. Follow-up due in  April must see provider for refills What changed: additional instructions   CINNAMON PO Take 3,000 mg by mouth daily. With cereal   clopidogrel 75 MG tablet Commonly known as: PLAVIX Take 1 tablet (75 mg total) by mouth daily with breakfast. Start taking on: June 18, 2023   furosemide  40 MG tablet Commonly known as: LASIX  Take 1 tablet (40 mg total) by mouth daily.   gabapentin  300 MG capsule Commonly known as: NEURONTIN  Take 300-600 mg by mouth at bedtime as needed (pinched nerve pain.).   isosorbide  mononitrate 30 MG 24 hr tablet Commonly known as: IMDUR  TAKE 1 TABLET BY MOUTH DAILY   metFORMIN  500 MG 24 hr tablet Commonly known as: GLUCOPHAGE -XR TAKE 1 TABLET BY MOUTH TWICE  DAILY   nitroGLYCERIN  0.4 MG SL tablet Commonly known as: Nitrostat  Place 1 tablet (0.4 mg  total) under the tongue every 5 (five) minutes as needed.   repaglinide  1 MG tablet Commonly known as: PRANDIN  TAKE 1 TABLET (1 MG TOTAL) BY MOUTH 3 (THREE) TIMES DAILY BEFORE MEALS.   telmisartan -hydrochlorothiazide  80-25 MG tablet Commonly known as: MICARDIS  HCT TAKE 1 TABLET BY MOUTH DAILY   TYLENOL  500 MG tablet Generic drug: acetaminophen  Take 500 mg by mouth as needed for moderate pain.   Zypitamag  4 MG Tabs Generic drug: Pitavastatin  Magnesium  TAKE 1 TABLET BY MOUTH EVERY DAY           Allergies Allergies  Allergen Reactions   Atorvastatin  Other (See Comments)    Weak legs   Coreg  [Carvedilol ] Other (See Comments)    weakness   Lovastatin  Other (See Comments)    Leg weakness   Morphine Sulfate Other (See Comments)    Per the pt: "After taking morphine, within 30 mins, I was soaking wet. I did not like the way it made me feel."    Outstanding Labs/Studies   N/a   Duration of Discharge Encounter   Greater than 30 minutes including physician time.  Signed, Johnie Nailer, NP 06/17/2023, 1:32 PM

## 2023-06-17 NOTE — Discharge Instructions (Signed)

## 2023-06-20 ENCOUNTER — Encounter (HOSPITAL_COMMUNITY): Payer: Self-pay | Admitting: Cardiovascular Disease

## 2023-06-24 ENCOUNTER — Telehealth (HOSPITAL_COMMUNITY): Payer: Self-pay

## 2023-06-24 NOTE — Telephone Encounter (Signed)
Per phase I cardiac rehab, fax referral to [facility].

## 2023-06-30 NOTE — Progress Notes (Signed)
 Cardiology Office Note    Date:  07/02/2023  ID:  Jeff Burgess, DOB 07-Nov-1935, MRN [id number] PCP:  Garald Karlynn GAILS, MD  Cardiologist:  [name], MD  Electrophysiologist:  None   Chief Complaint: Follow up for CAD   History of Present Illness: .    Jeff Burgess is a 88 y.o. male with visit-pertinent history of CAD s/p remote CABG in 1998, chronic RBBB, PAD with total occlusion of the right posterior tibial and left anterior tibial arteries on lower extremity Dopplers in 2019, mild carotid artery disease, hypertension, hyperlipidemia, type 2 diabetes mellitus, CKD stage IIIa, hypogonadism and bladder cancer s/p TURBT on 01/15/22.   Patient with history of CAD with remote CABG in 1998.  Carotid Dopplers in 03/2016 showed mild disease with 1 to 39% stenosis of bilateral ICAs.  Lower extremity Dopplers and ABIs were ordered by PCP in 09/2017 for evaluation of bilateral leg pain and showed total occlusion of the right posterior tibial artery and left anterior tibial artery.  ABI was 1.12 on the right and 0.95 on the left.  He was referred to vascular surgery and was seen by Dr. Harvey in 2019 who felt like symptoms were not consistent with claudication and more related to pseudoclaudication from back issues.  In 01/2022 patient was seen by Dr. Cash for evaluation of chest pain.  He is markedly hypertensive on arrival to the ED with BP of 191/84.  He reported intermittent episodes of chest pain over the last couple of days.  1 episode occurred with exertion and resolved with rest and 1 episode occurred after eating.  EKG showed normal sinus rhythm with known right bundle branch block, high sensitivity troponin was elevated at 221>>226.  Patient was having gross hematuria at that time and hemoglobin was 11.9, down from 14.6 in 10/2021.  Echo showed LVEF of 55% with mild LVH as well as moderate AAS and mild MR.  He was initially started on IV heparin  but was stopped due to gross  hematuria.  Troponin elevation was felt to be more consistent with demand ischemia in setting of anemia and elevated BP.  Decision was made to treat medically given ongoing hematuria and bleeding risk.  He was started on Imdur .  Echocardiogram in 02/2023 with LVEF 50 to 55%, low-flow/low gradient severe aortic stenosis with mean gradient 25 mmHg, AVA 0.83 cm, SVI 33, DI 0.26.  Nuclear stress test in March 2025 with evidence of no ischemia.  Patient was seen by Dr. Cash on 06/03/23.  Patient reported having progressive dyspnea with exertion and fatigue.  Patient reported he been very active until the prior few months and was now sitting in his chair all day due to fatigue.  He also reported mild chest pressure with exertion.  Given worsening aortic stenosis and symptoms patient had cardiac catheterization for TAVR workup.  Patient underwent cardiac catheterization on 06/16/2023 with chronic lesion of ostial LAD, patent LIMA to LAD, 90% M LCx treated with PCI/DES x 1.  Plan for DAPT with ASA and Plavix  for at least 6 months.  Today he presents for follow up.  He reports that he is doing well overall.  He denies chest pain, reports his breathing has remained at baseline.  Patient notes ongoing fatigue that has unchanged.  He notes that a few years ago he was able to easily go out to his building and work, now has to take regular breaks.  Patient reports that he has been doing well following  his catheterization.  He denies any palpitations, orthopnea or PND.  He does report some intermittent lower extremity edema.    ROS: .   Today he denies chest pain, palpitations, melena, hematuria, hemoptysis, diaphoresis, weakness, presyncope, syncope, orthopnea, and PND.  All other systems are reviewed and otherwise negative. Studies Reviewed: SABRA   EKG:  EKG is ordered today, personally reviewed, demonstrating  EKG Interpretation Date/Time:  Friday July 01 2023 09:52:22 EDT Ventricular Rate:  79 PR  Interval:  194 QRS Duration:  148 QT Interval:  408 QTC Calculation: 467 R Axis:   -45  Text Interpretation: Normal sinus rhythm Left axis deviation Right bundle branch block Minimal voltage criteria for LVH, may be normal variant ( R in aVL ) Inferior infarct , age undetermined When compared with ECG of 17-Jun-2023 11:21, Premature ventricular complexes are no longer Present Premature atrial complexes are no longer Present Confirmed by Maisen Klingler [id number]) on 07/02/2023 6:02:53 PM   CV Studies: Cardiac studies reviewed are outlined and summarized above. Otherwise please see EMR for full report. Cardiac Studies & Procedures   ______________________________________________________________________________________________ CARDIAC CATHETERIZATION  CARDIAC CATHETERIZATION 06/17/2023  Conclusion   Prox RCA lesion is 50% stenosed.   RPDA lesion is 70% stenosed.   Ost LAD to Prox LAD lesion is 100% stenosed.   Mid Cx lesion is 99% stenosed.   Prox Cx lesion is 50% stenosed.   A drug-eluting stent was successfully placed using a STENT SYNERGY XD 2.75X16.   Post intervention, there is a 0% residual stenosis.   LIMA graft was visualized by angiography and is normal in caliber.  Chronic occlusion of the ostial. Patent LIMA to LAD which fills the entire LAD Large caliber Circumflex with moderate proximal stenosis and severe mid stenosis Successful PTCA/DES x 1 mid Circumflex Large dominant RCA with moderate mid vessel stenosis. Diffuse severe disease in the small caliber PDA  Recommendations: Continue DAPT with ASA and Plavix  for at least six months. Continue workup for TAVR  Findings Coronary Findings Diagnostic  Dominance: Right  Left Anterior Descending Vessel is large. Ost LAD to Prox LAD lesion is 100% stenosed. The lesion is chronically occluded.  Left Circumflex Vessel is large. Prox Cx lesion is 50% stenosed. Mid Cx lesion is 99% stenosed.  Second Obtuse Marginal Branch Vessel is  small in size.  Third Obtuse Marginal Branch Vessel is small in size.  Right Coronary Artery Vessel is large. Prox RCA lesion is 50% stenosed.  Right Posterior Descending Artery Vessel is small in size. RPDA lesion is 70% stenosed.  LIMA LIMA Graft To Dist LAD LIMA graft was visualized by angiography and is normal in caliber.  Intervention  Mid Cx lesion Stent CATH VISTA GUIDE 6FR XBLD 3.5 guide catheter was inserted. Lesion crossed with guidewire using a WIRE ASAHI PROWATER 180CM. Pre-stent angioplasty was performed using a BALLOON EMERGE MR 2.0X12. A drug-eluting stent was successfully placed using a STENT SYNERGY XD 2.75X16. Stent strut is well apposed. Post-stent angioplasty was not performed. Post-Intervention Lesion Assessment The intervention was successful. Pre-interventional TIMI flow is 3. Post-intervention TIMI flow is 3. No complications occurred at this lesion. There is a 0% residual stenosis post intervention.   STRESS TESTS  MYOCARDIAL PERFUSION IMAGING 03/23/2023  Narrative   LV perfusion is normal. There is no evidence of ischemia. There is no evidence of infarction. Mildly reduced inferior counts consistent with diaphragm attenuation.   Left ventricular function is normal. Nuclear stress EF: 47%. The left ventricular ejection fraction is mildly  decreased (45-54%). End diastolic cavity size is normal.   The study is normal. The study is low risk.   ECHOCARDIOGRAM  ECHOCARDIOGRAM COMPLETE 02/23/2023  Narrative ECHOCARDIOGRAM REPORT    Patient Name:   Jeff Burgess Date of Exam: 02/23/2023 Medical Rec #:  [id number]        Height:       69.0 in Accession #:    [id number]       Weight:       195.0 lb Date of Birth:  [birth year]         BSA:          2.044 m Patient Age:    87 years         BP:           118/73 mmHg Patient Gender: M                HR:           81 bpm. Exam Location:  [address]  Procedure: 3D Echo and 2D Echo (Both Spectral and Color  Flow Doppler were utilized during procedure).  Indications:    CAD Native Vessel I25.10  History:        Patient has prior history of Echocardiogram examinations, most recent 02/02/2022. Prior CABG, CKD, Arrythmias:RBBB; Risk Factors:Hypertension, Dyslipidemia and Diabetes.  Sonographer:    Augustin Seals RDCS Referring Phys: [id number] CHRISTOPHER D MCALHANY  IMPRESSIONS   1. Left ventricular ejection fraction, by estimation, is 50 to 55%. Left ventricular ejection fraction by 3D volume is 50 %. The left ventricle has low normal function. The left ventricle has no regional wall motion abnormalities. Left ventricular diastolic parameters are consistent with Grade I diastolic dysfunction (impaired relaxation). 2. Right ventricular systolic function is normal. The right ventricular size is normal. 3. The mitral valve is degenerative. Trivial mitral valve regurgitation. No evidence of mitral stenosis. 4. The aortic valve is tricuspid. Aortic valve regurgitation is mild. Moderate to severe aortic valve stenosis. Aortic regurgitation PHT measures 485 msec. Aortic valve mean gradient measures 25.0 mmHg. Aortic valve Vmax measures 3.25 m/s. Although the mean AVG and Vmax are c/w moderate AS, the DVI is low at 0.26 and SVI low at 33. Findings are consistent with low flow low gradient moderate to severe AS. 5. The inferior vena cava is normal in size with greater than 50% respiratory variability, suggesting right atrial pressure of 3 mmHg. 6. Compared to echo dated 02/02/2022, the mean AVG is essentially unchanged but DVI has decreased from 0.35 to 0.26 and AVA has decreased from 1.2cm2 to 0.83cm2 (VTI).  FINDINGS Left Ventricle: Left ventricular ejection fraction, by estimation, is 50 to 55%. Left ventricular ejection fraction by 3D volume is 50 %. The left ventricle has low normal function. The left ventricle has no regional wall motion abnormalities. Strain imaging was not performed. The left  ventricular internal cavity size was normal in size. There is no left ventricular hypertrophy. Left ventricular diastolic parameters are consistent with Grade I diastolic dysfunction (impaired relaxation). Normal left ventricular filling pressure.  Right Ventricle: The right ventricular size is normal. No increase in right ventricular wall thickness. Right ventricular systolic function is normal.  Left Atrium: Left atrial size was normal in size.  Right Atrium: Right atrial size was normal in size.  Pericardium: There is no evidence of pericardial effusion.  Mitral Valve: The mitral valve is degenerative in appearance. There is mild calcification of the mitral valve leaflet(s). Trivial mitral valve regurgitation.  No evidence of mitral valve stenosis.  Tricuspid Valve: The tricuspid valve is normal in structure. Tricuspid valve regurgitation is trivial. No evidence of tricuspid stenosis.  Aortic Valve: The aortic valve is tricuspid. Aortic valve regurgitation is mild. Aortic regurgitation PHT measures 485 msec. Moderate to severe aortic stenosis is present. Aortic valve mean gradient measures 25.0 mmHg. Aortic valve peak gradient measures 42.2 mmHg. Aortic valve area, by VTI measures 0.83 cm.  Pulmonic Valve: The pulmonic valve was normal in structure. Pulmonic valve regurgitation is trivial. No evidence of pulmonic stenosis.  Aorta: The aortic root is normal in size and structure.  Venous: The inferior vena cava is normal in size with greater than 50% respiratory variability, suggesting right atrial pressure of 3 mmHg.  IAS/Shunts: No atrial level shunt detected by color flow Doppler.  Additional Comments: 3D was performed not requiring image post processing on an independent workstation and was abnormal.   LEFT VENTRICLE PLAX 2D LVIDd:         5.60 cm         Diastology LVIDs:         4.00 cm         LV e' medial:    5.43 cm/s LV PW:         0.90 cm         LV E/e' medial:   10.2 LV IVS:        1.00 cm         LV e' lateral:   5.92 cm/s LVOT diam:     2.00 cm         LV E/e' lateral: 9.4 LV SV:         67 LV SV Index:   33 LVOT Area:     3.14 cm        3D Volume EF LV 3D EF:    Left ventricul ar ejection fraction by 3D volume is 50 %.  3D Volume EF: 3D EF:        50 % LV EDV:       156 ml LV ESV:       78 ml LV SV:        78 ml  RIGHT VENTRICLE RV Basal diam:  3.90 cm RV Mid diam:    3.50 cm RV S prime:     7.95 cm/s TAPSE (M-mode): 1.6 cm  LEFT ATRIUM             Index        RIGHT ATRIUM           Index LA diam:        3.90 cm 1.91 cm/m   RA Area:     13.80 cm LA Vol (A2C):   28.0 ml 13.70 ml/m  RA Volume:   26.40 ml  12.92 ml/m LA Vol (A4C):   33.9 ml 16.59 ml/m LA Biplane Vol: 31.8 ml 15.56 ml/m AORTIC VALVE AV Area (Vmax):    0.94 cm AV Area (Vmean):   0.90 cm AV Area (VTI):     0.83 cm AV Vmax:           325.00 cm/s AV Vmean:          221.500 cm/s AV VTI:            0.811 m AV Peak Grad:      42.2 mmHg AV Mean Grad:      25.0 mmHg LVOT Vmax:  96.87 cm/s LVOT Vmean:        63.567 cm/s LVOT VTI:          0.213 m LVOT/AV VTI ratio: 0.26 AI PHT:            485 msec  AORTA Ao Root diam: 2.90 cm Ao Asc diam:  3.30 cm  MITRAL VALVE MV Area (PHT): 2.66 cm    SHUNTS MV Decel Time: 285 msec    Systemic VTI:  0.21 m MV E velocity: 55.50 cm/s  Systemic Diam: 2.00 cm MV A velocity: 94.90 cm/s MV E/A ratio:  0.58  Wilbert Bihari MD Electronically signed by Wilbert Bihari MD Signature Date/Time: 02/23/2023/1:44:36 PM    Final          ______________________________________________________________________________________________       Current Reported Medications:.    Current Meds  Medication Sig   amLODipine  (NORVASC ) 5 MG tablet TAKE 1 TABLET BY MOUTH DAILY   aspirin  81 MG tablet Take 2 tablets (162 mg total) by mouth daily. (Patient taking differently: Take 81 mg by mouth daily.)   b complex vitamins  capsule Take 1 capsule by mouth daily.   cholecalciferol  (VITAMIN D ) 25 MCG (1000 UT) tablet Take 1 tablet (1,000 Units total) by mouth every morning. Follow-up due in April must see provider for refills (Patient taking differently: Take 1,000 Units by mouth every morning.)   CINNAMON PO Take 3,000 mg by mouth daily. With cereal   furosemide  (LASIX ) 40 MG tablet Take 1 tablet (40 mg total) by mouth daily.   gabapentin  (NEURONTIN ) 300 MG capsule Take 300-600 mg by mouth at bedtime as needed (pinched nerve pain.).   isosorbide  mononitrate (IMDUR ) 30 MG 24 hr tablet TAKE 1 TABLET BY MOUTH DAILY   metFORMIN  (GLUCOPHAGE -XR) 500 MG 24 hr tablet TAKE 1 TABLET BY MOUTH TWICE  DAILY   nitroGLYCERIN  (NITROSTAT ) 0.4 MG SL tablet Place 1 tablet (0.4 mg total) under the tongue every 5 (five) minutes as needed.   repaglinide  (PRANDIN ) 1 MG tablet TAKE 1 TABLET (1 MG TOTAL) BY MOUTH 3 (THREE) TIMES DAILY BEFORE MEALS.   telmisartan -hydrochlorothiazide  (MICARDIS  HCT) 80-25 MG tablet TAKE 1 TABLET BY MOUTH DAILY   TYLENOL  500 MG tablet Take 500 mg by mouth as needed for moderate pain.   ZYPITAMAG  4 MG TABS TAKE 1 TABLET BY MOUTH EVERY DAY   [DISCONTINUED] clopidogrel  (PLAVIX ) 75 MG tablet Take 1 tablet (75 mg total) by mouth daily with breakfast.    Physical Exam:    VS:  BP 108/66 (BP Location: Left Arm, Patient Position: Sitting, Cuff Size: Normal)   Pulse 81   Resp 16   Ht 5' 9 (1.753 m)   Wt 198 lb (89.8 kg)   SpO2 98%   BMI 29.24 kg/m    Wt Readings from Last 3 Encounters:  07/01/23 198 lb (89.8 kg)  06/17/23 190 lb (86.2 kg)  06/14/23 196 lb (88.9 kg)    GEN: Well nourished, well developed in no acute distress NECK: No JVD; No carotid bruits CARDIAC: RRR, 2/6 systolic murmur, no rubs or gallops RESPIRATORY:  Clear to auscultation without rales, wheezing or rhonchi  ABDOMEN: Soft, non-tender, non-distended EXTREMITIES:  No edema; No acute deformity     Asessement and Plan:.    CAD: S/p  remote CABG in 1998.  ETT in 2016 was negative. Patient underwent cardiac catheterization on 06/16/2023 with chronic lesion of ostial LAD, patent LIMA to LAD, 90% M LCx treated with PCI/DES x 1.  Plan for DAPT with ASA and Plavix  for at least 6 months. Today patient reports that he is doing well overall.  He denies any further chest pain, reports his breathing has remained at baseline.  He continues to note some fatigue that is unchanged.  He is looking forward to proceeding with TAVR. Left radial cath site clean and intact without evidence of hematoma. Reviewed ED precautions.  Continue aspirin  81 mg daily, amlodipine  5 mg daily, Plavix  75 mg daily, Lasix  40 mg daily, Imdur  30 mg daily, telmisartan  hydrochlorothiazide  80-25 mg daily. Check BMET.   Aortic stenosis: Echo in 01/2022 showed moderate AS with mean gradient 23 mmHg, peak gradient 44 mmHg, AVA 1.2C meters squared, DVI 0.35 and mild MR. Echocardiogram in 02/2023 with LVEF 50 to 55%, low-flow/low gradient severe aortic stenosis with mean gradient 25 mmHg, AVA 0.83 cm, SVI 33, DI 0.26.  Patient reports ongoing and stable shortness of breath, denies any significant changes.  Patient currently undergoing TAVR workup. To have CT on 6/23 and to see Dr. Lucas in July.   PAD: Lower extremity Dopplers in 2019 showed total occlusion of the right posterior tibial artery and left anterior tibial artery.  ABI was 1.12 on the right and 0.95 on the left.  He was referred to vascular surgery who felt symptoms were not consistent with claudication and were related to pseudoclaudication from back issues. Continue aspirin  and high intensity statin.  HTN: Blood pressure today 108/66. Continue current antihypertensive regimen.   Hyperlipidemia: Last profile 12/20/2022 indicated total cholesterol 121, HDL 32, triglycerides 151 and LDL 59. Continue pitavastatin  4 mg daily.   Type 2 DM: Last hemoglobin A1c 7.8 on 02/18/2023.  Monitored and managed per PCP.  CKD stage III:  Last creatinine 1.75 on 06/03/2023. Check BMET.     Disposition: F/u with structural heart team for ongoing TAVR workup.   Signed, Lorynn Moeser D Zavia Pullen, NP

## 2023-07-01 ENCOUNTER — Encounter: Payer: Self-pay | Admitting: Cardiology

## 2023-07-01 ENCOUNTER — Ambulatory Visit: Attending: Cardiology | Admitting: Cardiology

## 2023-07-01 VITALS — BP 108/66 | HR 81 | Resp 16 | Ht 69.0 in | Wt 198.0 lb

## 2023-07-01 DIAGNOSIS — I2510 Atherosclerotic heart disease of native coronary artery without angina pectoris: Secondary | ICD-10-CM

## 2023-07-01 DIAGNOSIS — I350 Nonrheumatic aortic (valve) stenosis: Secondary | ICD-10-CM

## 2023-07-01 DIAGNOSIS — E785 Hyperlipidemia, unspecified: Secondary | ICD-10-CM | POA: Diagnosis not present

## 2023-07-01 DIAGNOSIS — I10 Essential (primary) hypertension: Secondary | ICD-10-CM

## 2023-07-01 DIAGNOSIS — Z955 Presence of coronary angioplasty implant and graft: Secondary | ICD-10-CM | POA: Diagnosis not present

## 2023-07-01 MED ORDER — CLOPIDOGREL BISULFATE 75 MG PO TABS
75.0000 mg | ORAL_TABLET | Freq: Every day | ORAL | 2 refills | Status: AC
Start: 2023-07-01 — End: ?

## 2023-07-01 NOTE — Patient Instructions (Signed)
 Medication Instructions:  No changes *If you need a refill on your cardiac medications before your next appointment, please call your pharmacy*  Lab Work: Today we are going to draw a Bmet If you have labs (blood work) drawn today and your tests are completely normal, you will receive your results only by: MyChart Message (if you have MyChart) OR A paper copy in the mail If you have any lab test that is abnormal or we need to change your treatment, we will call you to review the results.  Testing/Procedures: No testing  Follow-Up: At [facility], you and your health needs are our priority.  As part of our continuing mission to provide you with exceptional heart care, our providers are all part of one team.  This team includes your primary Cardiologist (physician) and Advanced Practice Providers or APPs (Physician Assistants and Nurse Practitioners) who all work together to provide you with the care you need, when you need it.  Your next appointment:   Waiting for confirmation from Doctor  We recommend signing up for the patient portal called MyChart.  Sign up information is provided on this After Visit Summary.  MyChart is used to connect with patients for Virtual Visits (Telemedicine).  Patients are able to view lab/test results, encounter notes, upcoming appointments, etc.  Non-urgent messages can be sent to your provider as well.   To learn more about what you can do with MyChart, go to [email or web address].

## 2023-07-02 ENCOUNTER — Encounter: Payer: Self-pay | Admitting: Cardiology

## 2023-07-02 LAB — BASIC METABOLIC PANEL WITH GFR
BUN/Creatinine Ratio: 22 (ref 10–24)
BUN: 39 mg/dL — ABNORMAL HIGH (ref 8–27)
CO2: 20 mmol/L (ref 20–29)
Calcium: 9.3 mg/dL (ref 8.6–10.2)
Chloride: 102 mmol/L (ref 96–106)
Creatinine, Ser: 1.79 mg/dL — ABNORMAL HIGH (ref 0.76–1.27)
Glucose: 140 mg/dL — ABNORMAL HIGH (ref 70–99)
Potassium: 4.3 mmol/L (ref 3.5–5.2)
Sodium: 140 mmol/L (ref 134–144)
eGFR: 36 mL/min/{1.73_m2} — ABNORMAL LOW (ref >59)

## 2023-07-04 ENCOUNTER — Ambulatory Visit (HOSPITAL_COMMUNITY)
Admission: RE | Admit: 2023-07-04 | Discharge: 2023-07-04 | Disposition: A | Discharge status: Home / Self Care | Source: Ambulatory Visit | Attending: Cardiology | Admitting: Cardiology

## 2023-07-04 ENCOUNTER — Ambulatory Visit: Payer: Self-pay | Admitting: Cardiology

## 2023-07-04 DIAGNOSIS — I2510 Atherosclerotic heart disease of native coronary artery without angina pectoris: Secondary | ICD-10-CM | POA: Diagnosis not present

## 2023-07-04 DIAGNOSIS — Z951 Presence of aortocoronary bypass graft: Secondary | ICD-10-CM | POA: Insufficient documentation

## 2023-07-04 DIAGNOSIS — J8410 Pulmonary fibrosis, unspecified: Secondary | ICD-10-CM | POA: Diagnosis not present

## 2023-07-04 DIAGNOSIS — I701 Atherosclerosis of renal artery: Secondary | ICD-10-CM | POA: Insufficient documentation

## 2023-07-04 DIAGNOSIS — I350 Nonrheumatic aortic (valve) stenosis: Secondary | ICD-10-CM | POA: Diagnosis not present

## 2023-07-04 DIAGNOSIS — J929 Pleural plaque without asbestos: Secondary | ICD-10-CM | POA: Diagnosis not present

## 2023-07-04 DIAGNOSIS — Z01810 Encounter for preprocedural cardiovascular examination: Secondary | ICD-10-CM | POA: Diagnosis not present

## 2023-07-04 MED ORDER — IOHEXOL 350 MG/ML SOLN: 100.0000 mL | Freq: Once | INTRAVENOUS | Status: DC | PRN

## 2023-07-04 MED ORDER — IOHEXOL 350 MG/ML SOLN
100.0000 mL | Freq: Once | INTRAVENOUS | Status: AC | PRN
Start: 2023-07-04 — End: 2023-07-04
  Administered 2023-07-04: 100 mL via INTRAVENOUS

## 2023-07-11 ENCOUNTER — Ambulatory Visit: Payer: Self-pay | Admitting: Cardiovascular Disease

## 2023-07-13 ENCOUNTER — Other Ambulatory Visit: Payer: Self-pay | Admitting: Internal Medicine

## 2023-08-03 ENCOUNTER — Encounter: Payer: Self-pay | Admitting: Surgery

## 2023-08-03 ENCOUNTER — Ambulatory Visit: Attending: Surgery | Admitting: Surgery

## 2023-08-03 ENCOUNTER — Other Ambulatory Visit: Payer: Self-pay

## 2023-08-03 VITALS — BP 144/74 | HR 85 | Resp 18 | Ht 69.0 in

## 2023-08-03 DIAGNOSIS — I350 Nonrheumatic aortic (valve) stenosis: Secondary | ICD-10-CM | POA: Diagnosis not present

## 2023-08-03 NOTE — Progress Notes (Unsigned)
 Patient ID: Jeff Burgess, male   DOB: 1935/04/08, 88 y.o.   MRN: [id number]  [facility]   MULTIDISCIPLINARY HEART VALVE CLINIC          CARDIOTHORACIC SURGERY CONSULTATION REPORT  PCP is Plotnikov, Karlynn GAILS, MD Referring Provider is [name], MD Primary Cardiologist is [name], MD  Reason for consultation: Severe aortic stenosis  HPI:  The patient is an 88 year old gentleman with history of hypertension, hyperlipidemia, diabetes, stage III chronic kidney disease, coronary artery disease status post CABG x 1 in 1998, bladder cancer status post TURBT in 2017, and severe aortic stenosis who was referred for consideration of TAVR.  His most recent echocardiogram on 02/23/2023 showed a trileaflet aortic valve with severe calcification and thickening.  The mean gradient was 25 mmHg with a peak of 42 mmHg.  Aortic valve area by VTI was 0.83 cm with a dimensionless index of 0.26 and a low stroke-volume index of 33.  He has developed progressive exertional fatigue and shortness of breath.  He had been very active until the past several months and now has difficulty with any activity.  He underwent cardiac catheterization on 06/17/2023 showing chronic occlusion of the ostial LAD with a patent LIMA to the LAD filling the entire vessel.  There was a large left circumflex with severe mid vessel stenosis successfully treated with PTCA and DES x 1.  The RCA had 50% proximal stenosis and 70% PDA stenosis.  He said that he has felt somewhat better since his PCI but is still having significant exertional fatigue and shortness of breath even with walking short distances.  He has had lower extremity edema and orthopnea.  He is here today with his wife.  They live in [place] .  She reports a significant decline in his activity level.  Past Medical History:  Diagnosis Date   Benign localized prostatic hyperplasia with lower urinary tract symptoms (LUTS)     Bladder cancer [facility]) dx 05/ 2017--  urologist- dr chauncey   TCC , Ta of bladder  s/p TURBT 06-02-2015   CAD (coronary artery disease)    no cardiologist--  followed by pcp,  dr plontnikov   Chronic low back pain with sciatica    neuropathy feet   CKD (chronic kidney disease), stage III (HCC)    Diabetes mellitus, type 2 [facility])    ED (erectile dysfunction)    Full dentures    Heart murmur    History of ETT    08-09-2014   fair exercise tolerance, no chest pain, normal BP response, no ST changes (baseline RBBB);  negative adequate ETT   History of kidney stones    Hyperlipidemia    Hypertension    Hypogonadism male    OA (osteoarthritis)    Right bundle branch block (RBBB)    S/P CABG x 1    1998   Sigmoid diverticulosis    Ventral hernia    midline   Wears glasses     Past Surgical History:  Procedure Laterality Date   COLONOSCOPY WITH PROPOFOL   02-01-2012   CORONARY ARTERY BYPASS GRAFT  1998  at [facility]   x1  vessel    CORONARY STENT INTERVENTION N/A 06/17/2023   Procedure: CORONARY STENT INTERVENTION;  Surgeon: Cash Lonni BIRCH, MD;  Location: [facility] INVASIVE CV LAB;  Service: Cardiovascular;  Laterality: N/A;   CORONARY/GRAFT ANGIOGRAPHY N/A 06/17/2023   Procedure: CORONARY/GRAFT ANGIOGRAPHY;  Surgeon: Cash Lonni BIRCH, MD;  Location: [facility] INVASIVE  CV LAB;  Service: Cardiovascular;  Laterality: N/A;   CYSTOSCOPY WITH BIOPSY N/A 10/08/2015   Procedure: CYSTOSCOPY WITH BLADDER BIOPSY;  Surgeon: Redell Lynwood Napoleon, MD;  Location: [facility];  Service: Urology;  Laterality: N/A;   CYSTOSCOPY WITH FULGERATION N/A 10/08/2015   Procedure: CYSTOSCOPY WITH FULGERATION;  Surgeon: Redell Lynwood Napoleon, MD;  Location: [facility];  Service: Urology;  Laterality: N/A;   CYSTOSCOPY WITH FULGERATION N/A 01/14/2022   Procedure: CYSTOSCOPY WITH FULGERATION;  Surgeon: Carolee Sherwood JONETTA DOUGLAS, MD;  Location: [facility];  Service: Urology;  Laterality: N/A;    TONSILLECTOMY  age 69   TRANSURETHRAL RESECTION OF BLADDER TUMOR Bilateral 05/28/2022   Procedure: TRANSURETHRAL RESECTION OF BLADDER TUMOR (TURBT)  BILATERAL RETROGRADE PYELOGRAM;  Surgeon: Carolee Sherwood JONETTA DOUGLAS, MD;  Location: [facility];  Service: Urology;  Laterality: Bilateral;  1 HR FOR CASE   TRANSURETHRAL RESECTION OF BLADDER TUMOR WITH GYRUS (TURBT-GYRUS) N/A 06/02/2015   Procedure: TRANSURETHRAL RESECTION OF BLADDER TUMOR WITH GYRUS (TURBT-GYRUS);  Surgeon: Redell Lynwood Napoleon, MD;  Location: [facility];  Service: Urology;  Laterality: N/A;    Family History  Problem Relation Age of Onset   Arthritis Mother 30       RA   Alcohol abuse Father 31       exposure   Diabetes Brother    Kidney disease Brother    Heart disease Brother    Hypertension Other    Colon cancer Neg Hx    Esophageal cancer Neg Hx    Rectal cancer Neg Hx    Stomach cancer Neg Hx     Social History   Socioeconomic History   Marital status: Married    Spouse name: Not on file   Number of children: Not on file   Years of education: Not on file   Highest education level: Not on file  Occupational History   Occupation: retired    Comment: but working fulltime  Tobacco Use   Smoking status: Former    Current packs/day: 0.00    Types: Cigarettes    Start date: 01/18/1956    Quit date: 01/18/1971    Years since quitting: 52.5    Passive exposure: Past   Smokeless tobacco: Former    Types: Snuff  Vaping Use   Vaping status: Never Used  Substance and Sexual Activity   Alcohol use: No   Drug use: No   Sexual activity: Not Currently  Other Topics Concern   Not on file  Social History Narrative   Regular exercise-yes   Social Drivers of Health   Financial Resource Strain: Low Risk  (05/30/2023)   Received from [facility]   Overall Financial Resource Strain (CARDIA)    Difficulty of Paying Living Expenses: Not hard at all  Food Insecurity: No Food Insecurity (05/30/2023)   [facility]   Hunger Vital Sign    Within the past 12 months, you worried that your food would run out before you got the money to buy more.: Never true    Within the past 12 months, the food you bought just didn't last and you didn't have money to get more.: Never true  Transportation Needs: No Transportation Needs (05/30/2023)   Received from [facility] - Transportation    Lack of Transportation (Medical): No    Lack of Transportation (Non-Medical): No  Physical Activity: Inactive (05/24/2023)   Exercise Vital Sign    Days of Exercise  per Week: 0 days    Minutes of Exercise per Session: 0 min  Stress: No Stress Concern Present (05/24/2023)   [employer] of Occupational Health - Occupational Stress Questionnaire    Feeling of Stress : Only a little  Social Connections: Moderately Isolated (05/24/2023)   Social Connection and Isolation Panel    Frequency of Communication with Friends and Family: More than three times a week    Frequency of Social Gatherings with Friends and Family: Once a week    Attends Religious Services: Never    Database administrator or Organizations: No    Attends Banker Meetings: Never    Marital Status: Married  Catering manager Violence: Not At Risk (05/24/2023)   Humiliation, Afraid, Rape, and Kick questionnaire    Fear of Current or Ex-Partner: No    Emotionally Abused: No    Physically Abused: No    Sexually Abused: No    Prior to Admission medications   Medication Sig Start Date End Date Taking? Authorizing Provider  amLODipine  (NORVASC ) 5 MG tablet TAKE 1 TABLET BY MOUTH DAILY 07/14/23  Yes Plotnikov, Aleksei V, MD  aspirin  81 MG tablet Take 2 tablets (162 mg total) by mouth daily. Patient taking differently: Take 81 mg by mouth daily. 03/09/16  Yes Plotnikov, Aleksei V, MD  b complex vitamins capsule Take 1 capsule by mouth daily. 06/14/23  Yes Plotnikov, Karlynn GAILS, MD  cholecalciferol  (VITAMIN D ) 25 MCG (1000 UT) tablet  Take 1 tablet (1,000 Units total) by mouth every morning. Follow-up due in April must see provider for refills Patient taking differently: Take 1,000 Units by mouth every morning. 01/23/18  Yes Plotnikov, Aleksei V, MD  CINNAMON PO Take 3,000 mg by mouth daily. With cereal   Yes [provider]  clopidogrel  (PLAVIX ) 75 MG tablet Take 1 tablet (75 mg total) by mouth daily with breakfast. 07/01/23  Yes Devora, Katlyn D, NP  furosemide  (LASIX ) 40 MG tablet Take 1 tablet (40 mg total) by mouth daily. 06/03/23  Yes Verlin Lonni BIRCH, MD  isosorbide  mononitrate (IMDUR ) 30 MG 24 hr tablet TAKE 1 TABLET BY MOUTH DAILY 06/13/23  Yes Plotnikov, Aleksei V, MD  metFORMIN  (GLUCOPHAGE -XR) 500 MG 24 hr tablet TAKE 1 TABLET BY MOUTH TWICE  DAILY 04/10/23  Yes Plotnikov, Aleksei V, MD  nitroGLYCERIN  (NITROSTAT ) 0.4 MG SL tablet Place 1 tablet (0.4 mg total) under the tongue every 5 (five) minutes as needed. 06/17/23  Yes Henry Shaver B, NP  repaglinide  (PRANDIN ) 1 MG tablet TAKE 1 TABLET (1 MG TOTAL) BY MOUTH 3 (THREE) TIMES DAILY BEFORE MEALS. 04/04/23  Yes Plotnikov, Karlynn GAILS, MD  telmisartan -hydrochlorothiazide  (MICARDIS  HCT) 80-25 MG tablet TAKE 1 TABLET BY MOUTH DAILY 02/28/23  Yes Plotnikov, Aleksei V, MD  TYLENOL  500 MG tablet Take 500 mg by mouth as needed for moderate pain.   Yes [provider]  ZYPITAMAG  4 MG TABS TAKE 1 TABLET BY MOUTH EVERY DAY 03/04/23  Yes Plotnikov, Aleksei V, MD  gabapentin  (NEURONTIN ) 300 MG capsule Take 300-600 mg by mouth at bedtime as needed (pinched nerve pain.). Patient not taking: Reported on 08/03/2023 03/03/23   [provider]    Current Outpatient Medications  Medication Sig Dispense Refill   amLODipine  (NORVASC ) 5 MG tablet TAKE 1 TABLET BY MOUTH DAILY 100 tablet 2   aspirin  81 MG tablet Take 2 tablets (162 mg total) by mouth daily. (Patient taking differently: Take 81 mg by mouth daily.) 100 tablet 3  b complex vitamins capsule Take 1 capsule  by mouth daily.     cholecalciferol  (VITAMIN D ) 25 MCG (1000 UT) tablet Take 1 tablet (1,000 Units total) by mouth every morning. Follow-up due in April must see provider for refills (Patient taking differently: Take 1,000 Units by mouth every morning.) 90 tablet 0   CINNAMON PO Take 3,000 mg by mouth daily. With cereal     clopidogrel  (PLAVIX ) 75 MG tablet Take 1 tablet (75 mg total) by mouth daily with breakfast. 90 tablet 2   furosemide  (LASIX ) 40 MG tablet Take 1 tablet (40 mg total) by mouth daily. 90 tablet 3   isosorbide  mononitrate (IMDUR ) 30 MG 24 hr tablet TAKE 1 TABLET BY MOUTH DAILY 100 tablet 2   metFORMIN  (GLUCOPHAGE -XR) 500 MG 24 hr tablet TAKE 1 TABLET BY MOUTH TWICE  DAILY 200 tablet 2   nitroGLYCERIN  (NITROSTAT ) 0.4 MG SL tablet Place 1 tablet (0.4 mg total) under the tongue every 5 (five) minutes as needed. 25 tablet 2   repaglinide  (PRANDIN ) 1 MG tablet TAKE 1 TABLET (1 MG TOTAL) BY MOUTH 3 (THREE) TIMES DAILY BEFORE MEALS. 300 tablet 3   telmisartan -hydrochlorothiazide  (MICARDIS  HCT) 80-25 MG tablet TAKE 1 TABLET BY MOUTH DAILY 90 tablet 3   TYLENOL  500 MG tablet Take 500 mg by mouth as needed for moderate pain.     ZYPITAMAG  4 MG TABS TAKE 1 TABLET BY MOUTH EVERY DAY 90 tablet 3   gabapentin  (NEURONTIN ) 300 MG capsule Take 300-600 mg by mouth at bedtime as needed (pinched nerve pain.). (Patient not taking: Reported on 08/03/2023)     No current facility-administered medications for this visit.    Allergies  Allergen Reactions   Atorvastatin  Other (See Comments)    Weak legs   Coreg  [Carvedilol ] Other (See Comments)    weakness   Lovastatin  Other (See Comments)    Leg weakness   Morphine Sulfate Other (See Comments)    Per the pt: After taking morphine, within 30 mins, I was soaking wet. I did not like the way it made me feel.      Review of Systems:   General:  normal appetite, + decreased energy, no weight gain, no weight loss, no fever  Cardiac:  + chest  pain with exertion, no chest pain at rest, +SOB with mile exertion, no resting SOB, no PND, + orthopnea, no palpitations, no arrhythmia, no atrial fibrillation, + LE edema, no dizzy spells, no syncope  Respiratory:  + shortness of breath, no home oxygen, no productive cough, no dry cough, no bronchitis, no wheezing, no hemoptysis, no asthma, no pain with inspiration or cough, no sleep apnea, no CPAP at night  GI:   no difficulty swallowing, no reflux, no frequent heartburn, no hiatal hernia, no abdominal pain, no constipation, no diarrhea, no hematochezia, no hematemesis, no melena  GU:   no dysuria,  no frequency, no urinary tract infection, no hematuria, no enlarged prostate, no kidney stones, + kidney disease  Vascular:  no pain suggestive of claudication, no pain in feet, no leg cramps, no varicose veins, no DVT, no non-healing foot ulcer  Neuro:   no stroke, no TIA's, no seizures, no headaches, no temporary blindness one eye,  no slurred speech, no peripheral neuropathy, no chronic pain, no instability of gait, no memory/cognitive dysfunction  Musculoskeletal: no arthritis, no joint swelling, no myalgias, no difficulty walking, normal mobility   Skin:   no rash, no itching, no skin infections, no pressure sores or  ulcerations  Psych:   no anxiety, no depression, no nervousness, no unusual recent stress  Eyes:   no blurry vision, no floaters, no recent vision changes, + wears glasses   ENT:   no hearing loss, no loose or painful teeth, + dentures  Hematologic:  no easy bruising, no abnormal bleeding, no clotting disorder, no frequent epistaxis  Endocrine:  + diabetes, does not check CBG's at home     Physical Exam:   BP (!) 144/74   Pulse 85   Resp 18   Ht 5' 9 (1.753 m)   SpO2 95%   BMI 29.24 kg/m   General:  [place],  tired-appearing  HEENT:  Unremarkable, NCAT, PERLA, EOMI  Neck:   no JVD, no bruits, no adenopathy   Chest:   clear to auscultation, symmetrical breath sounds, no  wheezes, no rhonchi   CV:   RRR, 3/6 systolic murmur RSB, no diastolic murmur  Abdomen:  soft, non-tender, no masses   Extremities:  warm, well-perfused, pedal pulses not palpable, mild bilateral lower extremity edema  Rectal/GU  Deferred  Neuro:   Grossly non-focal and symmetrical throughout  Skin:   Clean and dry, no rashes, no breakdown  Diagnostic Tests:  ECHOCARDIOGRAM REPORT       Patient Name:   Jeff Burgess Date of Exam: 02/23/2023  Medical Rec #:  [id number]        Height:       69.0 in  Accession #:    [id number]       Weight:       195.0 lb  Date of Birth:  March 26, 1935         BSA:          2.044 m  Patient Age:    87 years         BP:           118/73 mmHg  Patient Gender: M                HR:           81 bpm.  Exam Location:  [address]   Procedure: 3D Echo and 2D Echo (Both Spectral and Color Flow Doppler were             utilized during procedure).   Indications:    CAD Native Vessel I25.10    History:        Patient has prior history of Echocardiogram examinations,  most                 recent 02/02/2022. Prior CABG, CKD, Arrythmias:RBBB; Risk                  Factors:Hypertension, Dyslipidemia and Diabetes.    Sonographer:    Augustin Seals RDCS  Referring Phys: [id number] CHRISTOPHER D MCALHANY   IMPRESSIONS     1. Left ventricular ejection fraction, by estimation, is 50 to 55%. Left  ventricular ejection fraction by 3D volume is 50 %. The left ventricle has  low normal function. The left ventricle has no regional wall motion  abnormalities. Left ventricular  diastolic parameters are consistent with Grade I diastolic dysfunction  (impaired relaxation).   2. Right ventricular systolic function is normal. The right ventricular  size is normal.   3. The mitral valve is degenerative. Trivial mitral valve regurgitation.  No evidence of mitral stenosis.   4. The aortic valve is tricuspid. Aortic valve regurgitation is mild.  Moderate to severe  aortic valve  stenosis. Aortic regurgitation PHT  measures 485 msec. Aortic valve mean gradient measures 25.0 mmHg. Aortic  valve Vmax measures 3.25 m/s. Although the  mean AVG and Vmax are c/w moderate AS, the DVI is low at 0.26 and SVI low  at 33. Findings are consistent with low flow low gradient moderate to  severe AS.   5. The inferior vena cava is normal in size with greater than 50%  respiratory variability, suggesting right atrial pressure of 3 mmHg.   6. Compared to echo dated 02/02/2022, the mean AVG is essentially  unchanged but DVI has decreased from 0.35 to 0.26 and AVA has decreased  from 1.2cm2 to 0.83cm2 (VTI).   FINDINGS   Left Ventricle: Left ventricular ejection fraction, by estimation, is 50  to 55%. Left ventricular ejection fraction by 3D volume is 50 %. The left  ventricle has low normal function. The left ventricle has no regional wall  motion abnormalities. Strain  imaging was not performed. The left ventricular internal cavity size was  normal in size. There is no left ventricular hypertrophy. Left ventricular  diastolic parameters are consistent with Grade I diastolic dysfunction  (impaired relaxation). Normal left  ventricular filling pressure.   Right Ventricle: The right ventricular size is normal. No increase in  right ventricular wall thickness. Right ventricular systolic function is  normal.   Left Atrium: Left atrial size was normal in size.   Right Atrium: Right atrial size was normal in size.   Pericardium: There is no evidence of pericardial effusion.   Mitral Valve: The mitral valve is degenerative in appearance. There is  mild calcification of the mitral valve leaflet(s). Trivial mitral valve  regurgitation. No evidence of mitral valve stenosis.   Tricuspid Valve: The tricuspid valve is normal in structure. Tricuspid  valve regurgitation is trivial. No evidence of tricuspid stenosis.   Aortic Valve: The aortic valve is tricuspid. Aortic valve  regurgitation is  mild. Aortic regurgitation PHT measures 485 msec. Moderate to severe  aortic stenosis is present. Aortic valve mean gradient measures 25.0 mmHg.  Aortic valve peak gradient measures   42.2 mmHg. Aortic valve area, by VTI measures 0.83 cm.   Pulmonic Valve: The pulmonic valve was normal in structure. Pulmonic valve  regurgitation is trivial. No evidence of pulmonic stenosis.   Aorta: The aortic root is normal in size and structure.   Venous: The inferior vena cava is normal in size with greater than 50%  respiratory variability, suggesting right atrial pressure of 3 mmHg.   IAS/Shunts: No atrial level shunt detected by color flow Doppler.   Additional Comments: 3D was performed not requiring image post processing  on an independent workstation and was abnormal.     LEFT VENTRICLE  PLAX 2D  LVIDd:         5.60 cm         Diastology  LVIDs:         4.00 cm         LV e' medial:    5.43 cm/s  LV PW:         0.90 cm         LV E/e' medial:  10.2  LV IVS:        1.00 cm         LV e' lateral:   5.92 cm/s  LVOT diam:     2.00 cm         LV E/e' lateral: 9.4  LV  SV:         67  LV SV Index:   33  LVOT Area:     3.14 cm        3D Volume EF                                 LV 3D EF:    Left                                              ventricul                                              ar                                              ejection                                              fraction                                              by 3D                                              volume is                                              50 %.                                   3D Volume EF:                                 3D EF:        50 %                                 LV EDV:       156 ml                                 LV ESV:       78 ml                                 LV SV:  78 ml   RIGHT VENTRICLE  RV Basal diam:  3.90 cm  RV Mid diam:     3.50 cm  RV S prime:     7.95 cm/s  TAPSE (M-mode): 1.6 cm   LEFT ATRIUM             Index        RIGHT ATRIUM           Index  LA diam:        3.90 cm 1.91 cm/m   RA Area:     13.80 cm  LA Vol (A2C):   28.0 ml 13.70 ml/m  RA Volume:   26.40 ml  12.92 ml/m  LA Vol (A4C):   33.9 ml 16.59 ml/m  LA Biplane Vol: 31.8 ml 15.56 ml/m   AORTIC VALVE  AV Area (Vmax):    0.94 cm  AV Area (Vmean):   0.90 cm  AV Area (VTI):     0.83 cm  AV Vmax:           325.00 cm/s  AV Vmean:          221.500 cm/s  AV VTI:            0.811 m  AV Peak Grad:      42.2 mmHg  AV Mean Grad:      25.0 mmHg  LVOT Vmax:         96.87 cm/s  LVOT Vmean:        63.567 cm/s  LVOT VTI:          0.213 m  LVOT/AV VTI ratio: 0.26  AI PHT:            485 msec    AORTA  Ao Root diam: 2.90 cm  Ao Asc diam:  3.30 cm   MITRAL VALVE  MV Area (PHT): 2.66 cm    SHUNTS  MV Decel Time: 285 msec    Systemic VTI:  0.21 m  MV E velocity: 55.50 cm/s  Systemic Diam: 2.00 cm  MV A velocity: 94.90 cm/s  MV E/A ratio:  0.58   Jeff Bihari MD  Electronically signed by Jeff Bihari MD  Signature Date/Time: 02/23/2023/1:44:36 PM        Final      Physicians  Panel Physicians Referring Physician Case Authorizing Physician  Verlin Lonni BIRCH, MD (Primary)     Procedures  CORONARY/GRAFT ANGIOGRAPHY  CORONARY STENT INTERVENTION   Conclusion      Prox RCA lesion is 50% stenosed.   RPDA lesion is 70% stenosed.   Ost LAD to Prox LAD lesion is 100% stenosed.   Mid Cx lesion is 99% stenosed.   Prox Cx lesion is 50% stenosed.   A drug-eluting stent was successfully placed using a STENT SYNERGY XD [id number].   Post intervention, there is a 0% residual stenosis.   LIMA graft was visualized by angiography and is normal in caliber.   Chronic occlusion of the ostial. Patent LIMA to LAD which fills the entire LAD Large caliber Circumflex with moderate proximal stenosis and severe mid stenosis Successful PTCA/DES x  1 mid Circumflex Large dominant RCA with moderate mid vessel stenosis. Diffuse severe disease in the small caliber PDA   Recommendations: Continue DAPT with ASA and Plavix  for at least six months. Continue workup for TAVR   Indications  Severe aortic stenosis [I35.0 (ICD-10-CM)]  Coronary artery disease involving native coronary artery of native heart with unstable angina pectoris (HCC) [I25.110 (ICD-10-CM)]  Procedural Details  Technical Details Indication: CAD s/p CABG 1V in 1998, severe AS, workup for TAVR  Procedure: The risks, benefits, complications, treatment options, and expected outcomes were discussed with the patient. The patient and/or family concurred with the proposed plan, giving informed consent. The patient was sedated with Versed  and Fentanyl . The left wrist was prepped and draped in a sterile fashion. 1% lidocaine  was used for local anesthesia. Using the modified Seldinger access technique, a 5 French sheath was placed in the left radial artery. 3 mg Verapamil  was given through the sheath. Weight based IV heparin  was given. Standard diagnostic catheters were used to perform selective coronary angiography. I did not cross the aortic valve.   PCI Note: I engaged the left main with a XB LAD 3.5 guiding catheter. I passed a Prowater IC wire down the Circumflex. I then dilated the mid Circumflex with a 2.0 x 12 mm balloon. I deployed a 2.75 x 16 mm Synergy DES in the mid Circumflex. The stent was well expanded and was not post-dilated.   All catheter exchanges were performed over an exchange length guidewire. The sheath was removed from the right radial artery and a hemostasis band was applied at the arteriotomy site on the right wrist.      Estimated blood loss <50 mL.   During this procedure medications were administered to achieve and maintain moderate conscious sedation while the patient's heart rate, blood pressure, and oxygen saturation were continuously monitored and I  was present face-to-face 100% of this time. Jasmine Pearce Cardiovascular Specialist and Corean Sar Cardiovascular Specialist are independent, trained observers who assisted in the monitoring of the patient's level of consciousness.  Coronary Findings  Diagnostic Dominance: Right Left Anterior Descending  Vessel is large.  Ost LAD to Prox LAD lesion is 100% stenosed. The lesion is chronically occluded.    Left Circumflex  Vessel is large.  Prox Cx lesion is 50% stenosed.  Mid Cx lesion is 99% stenosed.    Second Obtuse Marginal Branch  Vessel is small in size.    Third Obtuse Marginal Branch  Vessel is small in size.    Right Coronary Artery  Vessel is large.  Prox RCA lesion is 50% stenosed.    Right Posterior Descending Artery  Vessel is small in size.  RPDA lesion is 70% stenosed.    LIMA LIMA Graft To Dist LAD  LIMA graft was visualized by angiography and is normal in caliber.    Intervention   Mid Cx lesion  Stent  CATH VISTA GUIDE 6FR XBLD 3.5 guide catheter was inserted. Lesion crossed with guidewire using a WIRE ASAHI PROWATER 180CM. Pre-stent angioplasty was performed using a BALLOON EMERGE MR 2.0X12. A drug-eluting stent was successfully placed using a STENT SYNERGY XD 2.75X16. Stent strut is well apposed. Post-stent angioplasty was not performed.  Post-Intervention Lesion Assessment  The intervention was successful. Pre-interventional TIMI flow is 3. Post-intervention TIMI flow is 3. No complications occurred at this lesion.  There is a 0% residual stenosis post intervention.     Coronary Diagrams  Diagnostic Dominance: Right  Intervention    ADDENDUM REPORT: 08/01/2023 17:51   CORRECTION TO IMPRESSIONS: CORRECTION TO IMPRESSIONS 3.  Annulus area: 406 mm2, suitable for 23 mm Sapien 3 valve.     Electronically Signed   By: Soyla Merck M.D.   On: 08/01/2023 17:51    Addended by Merck Soyla LABOR, MD on 08/01/2023  5:53 PM  ADDENDUM  REPORT: 07/10/2023 16:32   EXAM:  OVER-READ INTERPRETATION  CT CHEST   The following report is an over-read performed by radiologist Dr. Morgane Naveauof [facility] on 07/10/2023. This over-read does not include interpretation of cardiac or coronary anatomy or pathology. The cardiac TAVR CT interpretation by the cardiologist is attached.   COMPARISON:  CT abdomen pelvis 01/07/2022, CT chest 05/19/2015, CT angiography chest 07/04/2023   FINDINGS: No filling defects of the Jewel eyes pulmonary arteries.   Stable 9 x 5 mm right middle lobe pulmonary nodule (306:20). Stable subpleural right middle lobe 8 x 5 mm (306:22). Stable right lower lobe 4 mm subpleural nodule (306:39). Stable slightly more superior subpleural right lower lobe pulmonary nodule measuring 5 mm (306:23). No further evaluation of these chronic nodules is indicated. No new pulmonary nodule. Chronic calcified pleural plaques are again noted.   Slightly more prominent and interval increase in size of a right peribronchovascular 1.4 x 1 cm (from 1.1 by 1.1 cm) soft tissue density (304:94). Associated prominent but nonenlarged right hilar, left hilar, and mediastinal lymph nodes.   Persistent but nonenlarged right hilar, left hilar, and mediastinal lymph nodes.   No acute upper abdominal abnormality.   No acute osseous abnormality. Multilevel osteophyte formation. Intact sternotomy wires.   No acute soft tissue abnormality.   IMPRESSION: 1. Slightly more prominent and interval increase in size of a right peribronchovascular 1.4 x 1 cm (from 1.1 by 1.1 cm) soft tissue density. Recommend follow-up CT with intravenous contrast in 3 months to further evaluate stability. Additional imaging evaluation or consultation with Pulmonology or Thoracic Surgery recommended. 2. Persistent but nonenlarged right hilar, left hilar, and mediastinal lymph nodes. Recommend attention on follow-up.     Electronically  Signed   By: Morgane  Naveau M.D.   On: 07/10/2023 16:32    Addended by Naveau, Morgane E, MD on 07/10/2023  4:34 PM    Study Result  Narrative & Impression  CLINICAL DATA:  Aortic valve replacement (TAVR), pre-op eval   EXAM: Cardiac TAVR CT   TECHNIQUE: A non-contrast, gated CT scan was obtained with axial slices of 2.5 mm through the heart for aortic valve scoring. A 120 kV retrospective, gated, contrast cardiac scan was obtained. Gantry rotation speed was 230 msec and collimation was 0.63 mm. Nitroglycerin  was not given. A delayed scan was obtained to exclude left atrial appendage thrombus. The 3D dataset was reconstructed in systole with motion correction. The 3D data set was reconstructed in 5% intervals of the 0-95% of the R-R cycle. Systolic and diastolic phases were analyzed on a dedicated workstation using MPR, MIP, and VRT modes. The patient received 100 cc of contrast.   FINDINGS: Aortic Valve:   Tricuspid aortic valve with severely reduced cusp excursion. Severely thickened and severely calcified aortic valve cusps.   AV calcium  score: 2249   Virtual Basal Annulus Measurements:   Maximum/Minimum Diameter: 23.9 x 22.1 mm   Perimeter: 72.2 mm   Area:  406 mm2   No significant LVOT calcifications.   Membranous septal length: 5.5 mm   Based on these measurements, the annulus would be suitable for a 26 mm Sapien 3 valve. Alternatively, Heart Team can consider 26 or 29 mm Evolut valve (perimeter on borderline for valve type). Recommend Heart Team discussion for valve selection.   Sinus of Valsalva Measurements:   Non-coronary:  31 mm   Right - coronary:  31 mm   Left - coronary:  33 mm   Sinus of Valsalva Height:   Left: 23 mm  Right: 21 mm   Aorta: Conventional 3 vessel branch pattern of aortic arch.   Sinotubular Junction:  27 mm   Ascending Thoracic Aorta:  34 mm   Aortic Arch:  27 mm   Descending Thoracic Aorta:  24 mm   Coronary  Artery Height above Annulus:   Left main: 13 mm   Right coronary: 18 mm   Coronary Arteries: Normal coronary origin. Right dominance. The study was performed without use of NTG and insufficient for plaque evaluation. Prior CABG.   Optimum Fluoroscopic Angle for Delivery: RAO 5 CRA 4   OTHER:   Atria: Grossly normal.  Lipomatous hypertrophy of atrial septum.   Left atrial appendage: No thrombus.   Mitral valve: Grossly normal, no mitral annular calcifications.   Pulmonary artery: Normal caliber.   Pulmonary veins: Normal variant anatomy (3 left 2 right).   IMPRESSION: 1. Tricuspid aortic valve with severely reduced cusp excursion. Severely thickened and severely calcified aortic valve cusps. 2. Aortic valve calcium  score: 2249 3. Annulus area: 406 mm2, suitable for 26 mm Sapien 3 valve. No LVOT calcifications. Membranous septal length 5.5 mm. 4. Sufficient coronary artery heights from annulus. 5. Optimum fluoroscopic angle for delivery: RAO 5 CRA 4   Electronically Signed: By: Soyla Merck M.D. On: 07/04/2023 14:00      Narrative & Impression  CLINICAL DATA:  Aortic valve replacement, preoperative evaluation   EXAM: CTA ABDOMEN AND PELVIS WITHOUT AND WITH CONTRAST   TECHNIQUE: Multidetector CT imaging of the abdomen and pelvis was performed using the standard protocol during bolus administration of intravenous contrast. Multiplanar reconstructed images and MIPs were obtained and reviewed to evaluate the vascular anatomy.   RADIATION DOSE REDUCTION: This exam was performed according to the departmental dose-optimization program which includes automated exposure control, adjustment of the mA and/or kV according to patient size and/or use of iterative reconstruction technique.   CONTRAST:  OMNIPAQUE  IOHEXOL  350 MG/ML SOLN   COMPARISON:  None Available.   FINDINGS: VASCULAR   Aorta: Patent with mild atherosclerotic changes. Minimal  diameter measures 12 mm.   Celiac: Patent.   SMA: Patent.   Renals: Moderate stenosis in the right main renal artery estimated at 75%. Moderate stenosis in the left main renal artery estimated between 50 and 75%.   IMA: Patent   Inflow: Mild atherosclerotic changes in the proximal right internal carotid artery with estimated minimal diameter of 5 mm. The right internal iliac artery is patent. The right external iliac artery is patent with minimal diameter estimated at 9 mm. The left common iliac artery is patent with minimal diameter of 9 mm. Left internal iliac artery is patent. Left external iliac artery is patent with minimal diameter of 8 mm.   Proximal Outflow: The right common femoral artery is patent with minimal diameter estimated at 7 mm. The left common femoral artery is patent with minimal diameter estimated at 7 mm.   Veins: No obvious venous abnormality within the limitations of this arterial phase study.   Review of the MIP images confirms the above findings.   NON-VASCULAR   Lower chest: Reported separately.   Hepatobiliary: Layering gallstones.   Pancreas: Unremarkable. No pancreatic ductal dilatation or surrounding inflammatory changes.   Spleen: Normal in size without focal abnormality.   Adrenals/Urinary Tract: Adrenal glands are unremarkable. Kidneys are normal, without renal calculi, focal lesion, or hydronephrosis. Bladder is unremarkable.   Stomach/Bowel: Stomach is within normal limits. Appendix appears normal. No evidence of bowel wall thickening, distention,  or inflammatory changes.   Lymphatic: No lymphadenopathy.   Reproductive: Mildly enlarged prostate.   Other: Nothing significant.   Musculoskeletal: Postsurgical changes from posterior spinal fusion with associated degenerative spondylosis.   IMPRESSION: 1. Minimal access vessel diameters detailed above. 2. Renal atherosclerotic vascular disease with moderate bilateral renal  artery stenosis.     Electronically Signed   By: Maude Naegeli M.D.   On: 07/06/2023 13:12      Impression:  This 88 year old gentleman has stage D2, severe, symptomatic, paradoxical low-flow/low gradient aortic stenosis with NYHA class III symptoms of exertional fatigue and shortness of breath consistent with chronic diastolic congestive heart failure.  I have personally reviewed his 2D echocardiogram, cardiac catheterization, and CTA studies.  His echo in February 2025 shows a severely calcified and thickened aortic valve with a mean gradient of 25 mmHg but an aortic valve area of 0.83 cm.  Cardiac catheterization showed a patent LIMA to the LAD and a high-grade mid left circumflex stenosis successfully treated with PCI.  I agree that aortic valve replacement is indicated in this patient for relief of his symptoms and to prevent progressive left ventricular dysfunction.  Given his advanced age and prior coronary bypass surgery I think transcatheter aortic valve replacement be the best option for treating him.  His gated cardiac CTA shows anatomy suitable for TAVR using a 23 mm SAPIEN 3 valve.  His abdominal and pelvic CTA shows adequate pelvic vascular anatomy to allow transfemoral insertion.  His electrocardiogram shows sinus rhythm with right bundle branch block and therefore he is at increased risk for complete heart block.  We would plan to use right IJ pacing.  The patient and his family were counseled at length regarding treatment alternatives for management of severe symptomatic aortic stenosis. The risks and benefits of surgical intervention has been discussed in detail. Long-term prognosis with medical therapy was discussed. Alternative approaches such as conventional surgical aortic valve replacement, transcatheter aortic valve replacement, and palliative medical therapy were compared and contrasted at length. This discussion was placed in the context of the patient's own specific clinical  presentation and past medical history. All of their questions have been addressed.   Following the decision to proceed with transcatheter aortic valve replacement, a discussion was held regarding what types of management strategies would be attempted intraoperatively in the event of life-threatening complications, including whether or not the patient would be considered a candidate for the use of cardiopulmonary bypass and/or conversion to open sternotomy for attempted surgical intervention.  Given his advanced age and prior coronary bypass surgery I do not think he is a candidate for emergent sternotomy to manage any intraoperative complications.  The patient has been advised of a variety of complications that might develop including but not limited to risks of death, stroke, paravalvular leak, aortic dissection or other major vascular complications, aortic annulus rupture, device embolization, cardiac rupture or perforation, mitral regurgitation, acute myocardial infarction, arrhythmia, heart block or bradycardia requiring permanent pacemaker placement, congestive heart failure, respiratory failure, renal failure, pneumonia, infection, other late complications related to structural valve deterioration or migration, or other complications that might ultimately cause a temporary or permanent loss of functional independence or other long term morbidity. The patient provides full informed consent for the procedure as described and all questions were answered.      Plan:  He will be scheduled for transfemoral TAVR using a SAPIEN 3 valve with right IJ pacing on Tuesday, 08/09/2023.  I spent 60 minutes performing this consultation  and > 50% of this time was spent face to face counseling and coordinating the care of this patient's severe symptomatic aortic stenosis.   Dorise LOIS Fellers, MD 08/03/2023

## 2023-08-05 ENCOUNTER — Ambulatory Visit (HOSPITAL_COMMUNITY)
Admission: RE | Admit: 2023-08-05 | Discharge: 2023-08-05 | Disposition: A | Discharge status: Home / Self Care | Source: Ambulatory Visit | Attending: Cardiovascular Disease | Admitting: Cardiovascular Disease

## 2023-08-05 ENCOUNTER — Encounter (HOSPITAL_COMMUNITY)
Admission: RE | Admit: 2023-08-05 | Discharge: 2023-08-05 | Disposition: A | Discharge status: Home / Self Care | Source: Ambulatory Visit | Attending: Cardiovascular Disease | Admitting: Cardiovascular Disease

## 2023-08-05 ENCOUNTER — Ambulatory Visit: Payer: Self-pay

## 2023-08-05 ENCOUNTER — Other Ambulatory Visit: Payer: Self-pay

## 2023-08-05 DIAGNOSIS — Z01818 Encounter for other preprocedural examination: Secondary | ICD-10-CM | POA: Diagnosis not present

## 2023-08-05 DIAGNOSIS — I350 Nonrheumatic aortic (valve) stenosis: Secondary | ICD-10-CM | POA: Diagnosis not present

## 2023-08-05 LAB — COMPREHENSIVE METABOLIC PANEL WITH GFR
ALT: 30 U/L (ref 0–44)
AST: 25 U/L (ref 15–41)
Albumin: 3.8 g/dL (ref 3.5–5.0)
Alkaline Phosphatase: 76 U/L (ref 38–126)
Anion gap: 9 (ref 5–15)
BUN: 28 mg/dL — ABNORMAL HIGH (ref 8–23)
CO2: 23 mmol/L (ref 22–32)
Calcium: 9.4 mg/dL (ref 8.9–10.3)
Chloride: 105 mmol/L (ref 98–111)
Creatinine, Ser: 1.54 mg/dL — ABNORMAL HIGH (ref 0.61–1.24)
GFR, Estimated: 44 mL/min — ABNORMAL LOW (ref >60)
Glucose, Bld: 131 mg/dL — ABNORMAL HIGH (ref 70–99)
Potassium: 4.1 mmol/L (ref 3.5–5.1)
Sodium: 137 mmol/L (ref 135–145)
Total Bilirubin: 0.8 mg/dL (ref 0.0–1.2)
Total Protein: 6.9 g/dL (ref 6.5–8.1)

## 2023-08-05 LAB — URINALYSIS, ROUTINE W REFLEX MICROSCOPIC
Bilirubin Urine: NEGATIVE
Glucose, UA: NEGATIVE mg/dL
Hgb urine dipstick: NEGATIVE
Ketones, ur: NEGATIVE mg/dL
Leukocytes,Ua: NEGATIVE
Nitrite: NEGATIVE
Protein, ur: NEGATIVE mg/dL
Specific Gravity, Urine: 1.015 (ref 1.005–1.030)
pH: 5 (ref 5.0–8.0)

## 2023-08-05 LAB — PROTIME-INR
INR: 1 (ref 0.8–1.2)
Prothrombin Time: 13.9 s (ref 11.4–15.2)

## 2023-08-05 LAB — TYPE AND SCREEN
ABO/RH(D): A POS
Antibody Screen: NEGATIVE

## 2023-08-05 LAB — CBC
HCT: 38.5 % — ABNORMAL LOW (ref 39.0–52.0)
Hemoglobin: 12.8 g/dL — ABNORMAL LOW (ref 13.0–17.0)
MCH: 30.3 pg (ref 26.0–34.0)
MCHC: 33.2 g/dL (ref 30.0–36.0)
MCV: 91.2 fL (ref 80.0–100.0)
Platelets: 231 K/uL (ref 150–400)
RBC: 4.22 MIL/uL (ref 4.22–5.81)
RDW: 12.6 % (ref 11.5–15.5)
WBC: 11.3 K/uL — ABNORMAL HIGH (ref 4.0–10.5)
nRBC: 0 % (ref 0.0–0.2)

## 2023-08-05 LAB — SURGICAL PCR SCREEN
MRSA, PCR: NEGATIVE
Staphylococcus aureus: NEGATIVE

## 2023-08-05 NOTE — Progress Notes (Signed)
 Patient signed all consents at PAT lab appointment. CHG soap and instructions were given to patient. CHG surgical prep reviewed with patient and all questions answered.  Patients chart send to anesthesia for review. Pt denies any respiratory illness/infection in the last two months.

## 2023-08-08 MED ORDER — MAGNESIUM SULFATE 50 % IJ SOLN
40.0000 meq | INTRAMUSCULAR | Status: DC
  Filled 2023-08-08: qty 9.85

## 2023-08-08 MED ORDER — POTASSIUM CHLORIDE 2 MEQ/ML IV SOLN
80.0000 meq | INTRAVENOUS | Status: DC
  Filled 2023-08-08: qty 40

## 2023-08-08 MED ORDER — HEPARIN 30,000 UNITS/1000 ML (OHS) CELLSAVER SOLUTION
Status: DC
  Filled 2023-08-08: qty 1000

## 2023-08-08 MED ORDER — NOREPINEPHRINE 4 MG/250ML-% IV SOLN
0.0000 ug/min | INTRAVENOUS | Status: DC
  Filled 2023-08-08: qty 250

## 2023-08-08 MED ORDER — CEFAZOLIN SODIUM-DEXTROSE 2-4 GM/100ML-% IV SOLN
2.0000 g | INTRAVENOUS | Status: AC
  Administered 2023-08-09: 2 g via INTRAVENOUS
  Filled 2023-08-08: qty 100

## 2023-08-08 MED ORDER — DEXMEDETOMIDINE HCL IN NACL 400 MCG/100ML IV SOLN
0.1000 ug/kg/h | INTRAVENOUS | Status: AC
  Administered 2023-08-09: .6 ug/kg/h via INTRAVENOUS
  Administered 2023-08-09: 8 ug via INTRAVENOUS
  Administered 2023-08-09: 40 ug via INTRAVENOUS
  Filled 2023-08-08: qty 100

## 2023-08-08 NOTE — H&P (Signed)
 Patient ID: Jeff Burgess, male   DOB: [birth year], 88 y.o.   MRN: [id number]  [facility]   MULTIDISCIPLINARY HEART VALVE CLINIC             CARDIOTHORACIC SURGERY ADMISSION HISTORY AND PHYSICAL   PCP is Plotnikov, Karlynn GAILS, MD Referring Provider is [name], MD Primary Cardiologist is [name], MD   Reason for consultation: Severe aortic stenosis   HPI:   The patient is an 88 year old gentleman with history of hypertension, hyperlipidemia, diabetes, stage III chronic kidney disease, coronary artery disease status post CABG x 1 in 1998, bladder cancer status post TURBT in 2017, and severe aortic stenosis who was referred for consideration of TAVR.  His most recent echocardiogram on 02/23/2023 showed a trileaflet aortic valve with severe calcification and thickening.  The mean gradient was 25 mmHg with a peak of 42 mmHg.  Aortic valve area by VTI was 0.83 cm with a dimensionless index of 0.26 and a low stroke-volume index of 33.  He has developed progressive exertional fatigue and shortness of breath.  He had been very active until the past several months and now has difficulty with any activity.  He underwent cardiac catheterization on 06/17/2023 showing chronic occlusion of the ostial LAD with a patent LIMA to the LAD filling the entire vessel.  There was a large left circumflex with severe mid vessel stenosis successfully treated with PTCA and DES x 1.  The RCA had 50% proximal stenosis and 70% PDA stenosis.  He said that he has felt somewhat better since his PCI but is still having significant exertional fatigue and shortness of breath even with walking short distances.  He has had lower extremity edema and orthopnea.   He lives with his wife in [place] .  She reports a significant decline in his activity level.       Past Medical History:  Diagnosis Date   Benign localized prostatic hyperplasia with lower urinary tract symptoms (LUTS)      Bladder cancer [facility]) dx 05/ 2017--  urologist- dr chauncey    TCC , Ta of bladder  s/p TURBT 06-02-2015   CAD (coronary artery disease)      no cardiologist--  followed by pcp,  dr plontnikov   Chronic low back pain with sciatica      neuropathy feet   CKD (chronic kidney disease), stage III (HCC)     Diabetes mellitus, type 2 [facility])     ED (erectile dysfunction)     Full dentures     Heart murmur     History of ETT      08-09-2014   fair exercise tolerance, no chest pain, normal BP response, no ST changes (baseline RBBB);  negative adequate ETT   History of kidney stones     Hyperlipidemia     Hypertension     Hypogonadism male     OA (osteoarthritis)     Right bundle branch block (RBBB)     S/P CABG x 1      1998   Sigmoid diverticulosis     Ventral hernia      midline   Wears glasses                 Past Surgical History:  Procedure Laterality Date   COLONOSCOPY WITH PROPOFOL    02-01-2012   CORONARY ARTERY BYPASS GRAFT   1998  at [facility]    x1  vessel    CORONARY  STENT INTERVENTION N/A 06/17/2023    Procedure: CORONARY STENT INTERVENTION;  Surgeon: Verlin Lonni BIRCH, MD;  Location: [facility];  Service: Cardiovascular;  Laterality: N/A;   CORONARY/GRAFT ANGIOGRAPHY N/A 06/17/2023    Procedure: CORONARY/GRAFT ANGIOGRAPHY;  Surgeon: Verlin Lonni BIRCH, MD;  Location: [facility];  Service: Cardiovascular;  Laterality: N/A;   CYSTOSCOPY WITH BIOPSY N/A 10/08/2015    Procedure: CYSTOSCOPY WITH BLADDER BIOPSY;  Surgeon: Redell Lynwood Napoleon, MD;  Location: [facility];  Service: Urology;  Laterality: N/A;   CYSTOSCOPY WITH FULGERATION N/A 10/08/2015    Procedure: CYSTOSCOPY WITH FULGERATION;  Surgeon: Redell Lynwood Napoleon, MD;  Location: [facility];  Service: Urology;  Laterality: N/A;   CYSTOSCOPY WITH FULGERATION N/A 01/14/2022    Procedure: CYSTOSCOPY WITH FULGERATION;  Surgeon: Carolee Sherwood BIRCH DOUGLAS, MD;  Location:  [facility];  Service: Urology;  Laterality: N/A;   TONSILLECTOMY   age 9   TRANSURETHRAL RESECTION OF BLADDER TUMOR Bilateral 05/28/2022    Procedure: TRANSURETHRAL RESECTION OF BLADDER TUMOR (TURBT)  BILATERAL RETROGRADE PYELOGRAM;  Surgeon: Carolee Sherwood BIRCH DOUGLAS, MD;  Location: [facility];  Service: Urology;  Laterality: Bilateral;  1 HR FOR CASE   TRANSURETHRAL RESECTION OF BLADDER TUMOR WITH GYRUS (TURBT-GYRUS) N/A 06/02/2015    Procedure: TRANSURETHRAL RESECTION OF BLADDER TUMOR WITH GYRUS (TURBT-GYRUS);  Surgeon: Redell Lynwood Napoleon, MD;  Location: [facility];  Service: Urology;  Laterality: N/A;               Family History  Problem Relation Age of Onset   Arthritis Mother 35        RA   Alcohol abuse Father 58        exposure   Diabetes Brother     Kidney disease Brother     Heart disease Brother     Hypertension Other     Colon cancer Neg Hx     Esophageal cancer Neg Hx     Rectal cancer Neg Hx     Stomach cancer Neg Hx            Social History         Socioeconomic History   Marital status: Married      Spouse name: Not on file   Number of children: Not on file   Years of education: Not on file   Highest education level: Not on file  Occupational History   Occupation: retired      Comment: but working fulltime  Tobacco Use   Smoking status: Former      Current packs/day: 0.00      Types: Cigarettes      Start date: 01/18/1956      Quit date: 01/18/1971      Years since quitting: 52.5      Passive exposure: Past   Smokeless tobacco: Former      Types: Snuff  Vaping Use   Vaping status: Never Used  Substance and Sexual Activity   Alcohol use: No   Drug use: No   Sexual activity: Not Currently  Other Topics Concern   Not on file  Social History Narrative    Regular exercise-yes    Social Drivers of Health        Financial Resource Strain: Low Risk  (05/30/2023)    Received from [facility]    Overall Financial Resource Strain (CARDIA)      Difficulty of Paying Living Expenses: Not hard at all  Food Insecurity: No Food Insecurity (05/30/2023)    Received from [facility]    Hunger Vital Sign     Within the past 12 months, you worried that your food would run out before you got the money to buy more.: Never true     Within the past 12 months, the food you bought just didn't last and you didn't have money to get more.: Never true  Transportation Needs: No Transportation Needs (05/30/2023)    Received from [facility]    PRAPARE - Transportation     Lack of Transportation (Medical): No     Lack of Transportation (Non-Medical): No  Physical Activity: Inactive (05/24/2023)    Exercise Vital Sign     Days of Exercise per Week: 0 days     Minutes of Exercise per Session: 0 min  Stress: No Stress Concern Present (05/24/2023)    [employer] of Occupational Health - Occupational Stress Questionnaire     Feeling of Stress : Only a little  Social Connections: Moderately Isolated (05/24/2023)    Social Connection and Isolation Panel     Frequency of Communication with Friends and Family: More than three times a week     Frequency of Social Gatherings with Friends and Family: Once a week     Attends Religious Services: Never     Database administrator or Organizations: No     Attends Banker Meetings: Never     Marital Status: Married  Catering manager Violence: Not At Risk (05/24/2023)    Humiliation, Afraid, Rape, and Kick questionnaire     Fear of Current or Ex-Partner: No     Emotionally Abused: No     Physically Abused: No     Sexually Abused: No             Prior to Admission medications   Medication Sig Start Date End Date Taking? Authorizing Provider  amLODipine  (NORVASC ) 5 MG tablet TAKE 1 TABLET BY MOUTH DAILY 07/14/23   Yes Plotnikov, Aleksei V, MD  aspirin  81 MG tablet Take 2 tablets (162 mg total) by mouth daily. Patient taking differently: Take 81 mg by mouth daily. 03/09/16   Yes Plotnikov, Aleksei  V, MD  b complex vitamins capsule Take 1 capsule by mouth daily. 06/14/23   Yes Plotnikov, Karlynn GAILS, MD  cholecalciferol  (VITAMIN D ) 25 MCG (1000 UT) tablet Take 1 tablet (1,000 Units total) by mouth every morning. Follow-up due in April must see provider for refills Patient taking differently: Take 1,000 Units by mouth every morning. 01/23/18   Yes Plotnikov, Aleksei V, MD  CINNAMON PO Take 3,000 mg by mouth daily. With cereal     Yes [provider]  clopidogrel  (PLAVIX ) 75 MG tablet Take 1 tablet (75 mg total) by mouth daily with breakfast. 07/01/23   Yes Devora, Katlyn D, NP  furosemide  (LASIX ) 40 MG tablet Take 1 tablet (40 mg total) by mouth daily. 06/03/23   Yes Verlin Lonni BIRCH, MD  isosorbide  mononitrate (IMDUR ) 30 MG 24 hr tablet TAKE 1 TABLET BY MOUTH DAILY 06/13/23   Yes Plotnikov, Aleksei V, MD  metFORMIN  (GLUCOPHAGE -XR) 500 MG 24 hr tablet TAKE 1 TABLET BY MOUTH TWICE  DAILY 04/10/23   Yes Plotnikov, Aleksei V, MD  nitroGLYCERIN  (NITROSTAT ) 0.4 MG SL tablet Place 1 tablet (0.4 mg total) under the tongue every 5 (five) minutes as needed. 06/17/23   Yes Henry Manuelita NOVAK, NP  repaglinide  (PRANDIN ) 1 MG tablet TAKE 1 TABLET (  1 MG TOTAL) BY MOUTH 3 (THREE) TIMES DAILY BEFORE MEALS. 04/04/23   Yes Plotnikov, Karlynn GAILS, MD  telmisartan -hydrochlorothiazide  (MICARDIS  HCT) 80-25 MG tablet TAKE 1 TABLET BY MOUTH DAILY 02/28/23   Yes Plotnikov, Aleksei V, MD  TYLENOL  500 MG tablet Take 500 mg by mouth as needed for moderate pain.     Yes [provider]  ZYPITAMAG  4 MG TABS TAKE 1 TABLET BY MOUTH EVERY DAY 03/04/23   Yes Plotnikov, Aleksei V, MD  gabapentin  (NEURONTIN ) 300 MG capsule Take 300-600 mg by mouth at bedtime as needed (pinched nerve pain.). Patient not taking: Reported on 08/03/2023 03/03/23     [provider]            Current Outpatient Medications  Medication Sig Dispense Refill   amLODipine  (NORVASC ) 5 MG tablet TAKE 1 TABLET BY MOUTH DAILY 100 tablet 2    aspirin  81 MG tablet Take 2 tablets (162 mg total) by mouth daily. (Patient taking differently: Take 81 mg by mouth daily.) 100 tablet 3   b complex vitamins capsule Take 1 capsule by mouth daily.       cholecalciferol  (VITAMIN D ) 25 MCG (1000 UT) tablet Take 1 tablet (1,000 Units total) by mouth every morning. Follow-up due in April must see provider for refills (Patient taking differently: Take 1,000 Units by mouth every morning.) 90 tablet 0   CINNAMON PO Take 3,000 mg by mouth daily. With cereal       clopidogrel  (PLAVIX ) 75 MG tablet Take 1 tablet (75 mg total) by mouth daily with breakfast. 90 tablet 2   furosemide  (LASIX ) 40 MG tablet Take 1 tablet (40 mg total) by mouth daily. 90 tablet 3   isosorbide  mononitrate (IMDUR ) 30 MG 24 hr tablet TAKE 1 TABLET BY MOUTH DAILY 100 tablet 2   metFORMIN  (GLUCOPHAGE -XR) 500 MG 24 hr tablet TAKE 1 TABLET BY MOUTH TWICE  DAILY 200 tablet 2   nitroGLYCERIN  (NITROSTAT ) 0.4 MG SL tablet Place 1 tablet (0.4 mg total) under the tongue every 5 (five) minutes as needed. 25 tablet 2   repaglinide  (PRANDIN ) 1 MG tablet TAKE 1 TABLET (1 MG TOTAL) BY MOUTH 3 (THREE) TIMES DAILY BEFORE MEALS. 300 tablet 3   telmisartan -hydrochlorothiazide  (MICARDIS  HCT) 80-25 MG tablet TAKE 1 TABLET BY MOUTH DAILY 90 tablet 3   TYLENOL  500 MG tablet Take 500 mg by mouth as needed for moderate pain.       ZYPITAMAG  4 MG TABS TAKE 1 TABLET BY MOUTH EVERY DAY 90 tablet 3   gabapentin  (NEURONTIN ) 300 MG capsule Take 300-600 mg by mouth at bedtime as needed (pinched nerve pain.). (Patient not taking: Reported on 08/03/2023)          No current facility-administered medications for this visit.        Allergies       Allergies  Allergen Reactions   Atorvastatin  Other (See Comments)      Weak legs   Coreg  [Carvedilol ] Other (See Comments)      weakness   Lovastatin  Other (See Comments)      Leg weakness   Morphine Sulfate Other (See Comments)      Per the pt: After taking  morphine, within 30 mins, I was soaking wet. I did not like the way it made me feel.            Review of Systems:               General:  normal appetite, + decreased energy, no weight gain, no weight loss, no fever             Cardiac:                       + chest pain with exertion, no chest pain at rest, +SOB with mile exertion, no resting SOB, no PND, + orthopnea, no palpitations, no arrhythmia, no atrial fibrillation, + LE edema, no dizzy spells, no syncope             Respiratory:                 + shortness of breath, no home oxygen, no productive cough, no dry cough, no bronchitis, no wheezing, no hemoptysis, no asthma, no pain with inspiration or cough, no sleep apnea, no CPAP at night             GI:                               no difficulty swallowing, no reflux, no frequent heartburn, no hiatal hernia, no abdominal pain, no constipation, no diarrhea, no hematochezia, no hematemesis, no melena             GU:                              no dysuria,  no frequency, no urinary tract infection, no hematuria, no enlarged prostate, no kidney stones, + kidney disease             Vascular:                     no pain suggestive of claudication, no pain in feet, no leg cramps, no varicose veins, no DVT, no non-healing foot ulcer             Neuro:                         no stroke, no TIA's, no seizures, no headaches, no temporary blindness one eye,  no slurred speech, no peripheral neuropathy, no chronic pain, no instability of gait, no memory/cognitive dysfunction             Musculoskeletal:         no arthritis, no joint swelling, no myalgias, no difficulty walking, normal mobility              Skin:                            no rash, no itching, no skin infections, no pressure sores or ulcerations             Psych:                         no anxiety, no depression, no nervousness, no unusual recent stress             Eyes:                           no blurry  vision, no floaters, no recent vision changes, + wears glasses              ENT:  no hearing loss, no loose or painful teeth, + dentures             Hematologic:               no easy bruising, no abnormal bleeding, no clotting disorder, no frequent epistaxis             Endocrine:                   + diabetes, does not check CBG's at home                            Physical Exam:               BP (!) 144/74   Pulse 85   Resp 18   Ht 5' 9 (1.753 m)   SpO2 95%   BMI 29.24 kg/m              General:                      Elderyly,  tired-appearing             HEENT:                       Unremarkable, NCAT, PERLA, EOMI             Neck:                           no JVD, no bruits, no adenopathy              Chest:                          clear to auscultation, symmetrical breath sounds, no wheezes, no rhonchi              CV:                              RRR, 3/6 systolic murmur RSB, no diastolic murmur             Abdomen:                    soft, non-tender, no masses              Extremities:                 warm, well-perfused, pedal pulses not palpable, mild bilateral lower extremity edema             Rectal/GU                   Deferred             Neuro:                         Grossly non-focal and symmetrical throughout             Skin:                            Clean and dry, no rashes, no breakdown   Diagnostic Tests:   ECHOCARDIOGRAM REPORT       Patient Name:   DALEON WILLINGER Date  of Exam: 02/23/2023  Medical Rec #:  [id number]        Height:       69.0 in  Accession #:    [id number]       Weight:       195.0 lb  Date of Birth:  01-08-1936         BSA:          2.044 m  Patient Age:    87 years         BP:           118/73 mmHg  Patient Gender: M                HR:           81 bpm.  Exam Location:  [address]   Procedure: 3D Echo and 2D Echo (Both Spectral and Color Flow Doppler were             utilized during procedure).    Indications:    CAD Native Vessel I25.10    History:        Patient has prior history of Echocardiogram examinations,  most                 recent 02/02/2022. Prior CABG, CKD, Arrythmias:RBBB; Risk                  Factors:Hypertension, Dyslipidemia and Diabetes.    Sonographer:    Augustin Seals RDCS  Referring Phys: [id number] CHRISTOPHER D MCALHANY   IMPRESSIONS     1. Left ventricular ejection fraction, by estimation, is 50 to 55%. Left  ventricular ejection fraction by 3D volume is 50 %. The left ventricle has  low normal function. The left ventricle has no regional wall motion  abnormalities. Left ventricular  diastolic parameters are consistent with Grade I diastolic dysfunction  (impaired relaxation).   2. Right ventricular systolic function is normal. The right ventricular  size is normal.   3. The mitral valve is degenerative. Trivial mitral valve regurgitation.  No evidence of mitral stenosis.   4. The aortic valve is tricuspid. Aortic valve regurgitation is mild.  Moderate to severe aortic valve stenosis. Aortic regurgitation PHT  measures 485 msec. Aortic valve mean gradient measures 25.0 mmHg. Aortic  valve Vmax measures 3.25 m/s. Although the  mean AVG and Vmax are c/w moderate AS, the DVI is low at 0.26 and SVI low  at 33. Findings are consistent with low flow low gradient moderate to  severe AS.   5. The inferior vena cava is normal in size with greater than 50%  respiratory variability, suggesting right atrial pressure of 3 mmHg.   6. Compared to echo dated 02/02/2022, the mean AVG is essentially  unchanged but DVI has decreased from 0.35 to 0.26 and AVA has decreased  from 1.2cm2 to 0.83cm2 (VTI).   FINDINGS   Left Ventricle: Left ventricular ejection fraction, by estimation, is 50  to 55%. Left ventricular ejection fraction by 3D volume is 50 %. The left  ventricle has low normal function. The left ventricle has no regional wall  motion abnormalities. Strain   imaging was not performed. The left ventricular internal cavity size was  normal in size. There is no left ventricular hypertrophy. Left ventricular  diastolic parameters are consistent with Grade I diastolic dysfunction  (impaired relaxation). Normal left  ventricular filling pressure.   Right Ventricle: The right ventricular size is normal. No increase in  right ventricular  wall thickness. Right ventricular systolic function is  normal.   Left Atrium: Left atrial size was normal in size.   Right Atrium: Right atrial size was normal in size.   Pericardium: There is no evidence of pericardial effusion.   Mitral Valve: The mitral valve is degenerative in appearance. There is  mild calcification of the mitral valve leaflet(s). Trivial mitral valve  regurgitation. No evidence of mitral valve stenosis.   Tricuspid Valve: The tricuspid valve is normal in structure. Tricuspid  valve regurgitation is trivial. No evidence of tricuspid stenosis.   Aortic Valve: The aortic valve is tricuspid. Aortic valve regurgitation is  mild. Aortic regurgitation PHT measures 485 msec. Moderate to severe  aortic stenosis is present. Aortic valve mean gradient measures 25.0 mmHg.  Aortic valve peak gradient measures   42.2 mmHg. Aortic valve area, by VTI measures 0.83 cm.   Pulmonic Valve: The pulmonic valve was normal in structure. Pulmonic valve  regurgitation is trivial. No evidence of pulmonic stenosis.   Aorta: The aortic root is normal in size and structure.   Venous: The inferior vena cava is normal in size with greater than 50%  respiratory variability, suggesting right atrial pressure of 3 mmHg.   IAS/Shunts: No atrial level shunt detected by color flow Doppler.   Additional Comments: 3D was performed not requiring image post processing  on an independent workstation and was abnormal.     LEFT VENTRICLE  PLAX 2D  LVIDd:         5.60 cm         Diastology  LVIDs:         4.00 cm          LV e' medial:    5.43 cm/s  LV PW:         0.90 cm         LV E/e' medial:  10.2  LV IVS:        1.00 cm         LV e' lateral:   5.92 cm/s  LVOT diam:     2.00 cm         LV E/e' lateral: 9.4  LV SV:         67  LV SV Index:   33  LVOT Area:     3.14 cm        3D Volume EF                                 LV 3D EF:    Left                                              ventricul                                              ar                                              ejection  fraction                                              by 3D                                              volume is                                              50 %.                                   3D Volume EF:                                 3D EF:        50 %                                 LV EDV:       156 ml                                 LV ESV:       78 ml                                 LV SV:        78 ml   RIGHT VENTRICLE  RV Basal diam:  3.90 cm  RV Mid diam:    3.50 cm  RV S prime:     7.95 cm/s  TAPSE (M-mode): 1.6 cm   LEFT ATRIUM             Index        RIGHT ATRIUM           Index  LA diam:        3.90 cm 1.91 cm/m   RA Area:     13.80 cm  LA Vol (A2C):   28.0 ml 13.70 ml/m  RA Volume:   26.40 ml  12.92 ml/m  LA Vol (A4C):   33.9 ml 16.59 ml/m  LA Biplane Vol: 31.8 ml 15.56 ml/m   AORTIC VALVE  AV Area (Vmax):    0.94 cm  AV Area (Vmean):   0.90 cm  AV Area (VTI):     0.83 cm  AV Vmax:           325.00 cm/s  AV Vmean:          221.500 cm/s  AV VTI:            0.811 m  AV Peak Grad:      42.2 mmHg  AV Mean Grad:      25.0 mmHg  LVOT Vmax:         96.87 cm/s  LVOT Vmean:  63.567 cm/s  LVOT VTI:          0.213 m  LVOT/AV VTI ratio: 0.26  AI PHT:            485 msec    AORTA  Ao Root diam: 2.90 cm  Ao Asc diam:  3.30 cm   MITRAL VALVE  MV Area (PHT): 2.66 cm    SHUNTS  MV Decel Time: 285 msec    Systemic VTI:   0.21 m  MV E velocity: 55.50 cm/s  Systemic Diam: 2.00 cm  MV A velocity: 94.90 cm/s  MV E/A ratio:  0.58   Wilbert Bihari MD  Electronically signed by Wilbert Bihari MD  Signature Date/Time: 02/23/2023/1:44:36 PM        Final        Physicians   Panel Physicians Referring Physician Case Authorizing Physician  Verlin Lonni BIRCH, MD (Primary)        Procedures   CORONARY/GRAFT ANGIOGRAPHY  CORONARY STENT INTERVENTION    Conclusion       Prox RCA lesion is 50% stenosed.   RPDA lesion is 70% stenosed.   Ost LAD to Prox LAD lesion is 100% stenosed.   Mid Cx lesion is 99% stenosed.   Prox Cx lesion is 50% stenosed.   A drug-eluting stent was successfully placed using a STENT SYNERGY XD [id number].   Post intervention, there is a 0% residual stenosis.   LIMA graft was visualized by angiography and is normal in caliber.   Chronic occlusion of the ostial. Patent LIMA to LAD which fills the entire LAD Large caliber Circumflex with moderate proximal stenosis and severe mid stenosis Successful PTCA/DES x 1 mid Circumflex Large dominant RCA with moderate mid vessel stenosis. Diffuse severe disease in the small caliber PDA   Recommendations: Continue DAPT with ASA and Plavix  for at least six months. Continue workup for TAVR   Indications   Severe aortic stenosis [I35.0 (ICD-10-CM)]  Coronary artery disease involving native coronary artery of native heart with unstable angina pectoris (HCC) [I25.110 (ICD-10-CM)]    Procedural Details   Technical Details Indication: CAD s/p CABG 1V in 1998, severe AS, workup for TAVR  Procedure: The risks, benefits, complications, treatment options, and expected outcomes were discussed with the patient. The patient and/or family concurred with the proposed plan, giving informed consent. The patient was sedated with Versed  and Fentanyl . The left wrist was prepped and draped in a sterile fashion. 1% lidocaine  was used for local anesthesia. Using the  modified Seldinger access technique, a 5 French sheath was placed in the left radial artery. 3 mg Verapamil  was given through the sheath. Weight based IV heparin  was given. Standard diagnostic catheters were used to perform selective coronary angiography. I did not cross the aortic valve.   PCI Note: I engaged the left main with a XB LAD 3.5 guiding catheter. I passed a Prowater IC wire down the Circumflex. I then dilated the mid Circumflex with a 2.0 x 12 mm balloon. I deployed a 2.75 x 16 mm Synergy DES in the mid Circumflex. The stent was well expanded and was not post-dilated.   All catheter exchanges were performed over an exchange length guidewire. The sheath was removed from the right radial artery and a hemostasis band was applied at the arteriotomy site on the right wrist.      Estimated blood loss <50 mL.   During this procedure medications were administered to achieve and maintain moderate conscious sedation while the patient's heart  rate, blood pressure, and oxygen saturation were continuously monitored and I was present face-to-face 100% of this time. Jasmine Pearce Cardiovascular Specialist and Corean Sar Cardiovascular Specialist are independent, trained observers who assisted in the monitoring of the patient's level of consciousness.  Coronary Findings   Diagnostic Dominance: Right Left Anterior Descending  Vessel is large.  Ost LAD to Prox LAD lesion is 100% stenosed. The lesion is chronically occluded.    Left Circumflex  Vessel is large.  Prox Cx lesion is 50% stenosed.  Mid Cx lesion is 99% stenosed.    Second Obtuse Marginal Branch  Vessel is small in size.    Third Obtuse Marginal Branch  Vessel is small in size.    Right Coronary Artery  Vessel is large.  Prox RCA lesion is 50% stenosed.    Right Posterior Descending Artery  Vessel is small in size.  RPDA lesion is 70% stenosed.    LIMA LIMA Graft To Dist LAD  LIMA graft was visualized by  angiography and is normal in caliber.    Intervention    Mid Cx lesion  Stent  CATH VISTA GUIDE 6FR XBLD 3.5 guide catheter was inserted. Lesion crossed with guidewire using a WIRE ASAHI PROWATER 180CM. Pre-stent angioplasty was performed using a BALLOON EMERGE MR 2.0X12. A drug-eluting stent was successfully placed using a STENT SYNERGY XD 2.75X16. Stent strut is well apposed. Post-stent angioplasty was not performed.  Post-Intervention Lesion Assessment  The intervention was successful. Pre-interventional TIMI flow is 3. Post-intervention TIMI flow is 3. No complications occurred at this lesion.  There is a 0% residual stenosis post intervention.      Coronary Diagrams   Diagnostic Dominance: Right  Intervention       ADDENDUM REPORT: 08/01/2023 17:51   CORRECTION TO IMPRESSIONS: CORRECTION TO IMPRESSIONS 3.  Annulus area: 406 mm2, suitable for 23 mm Sapien 3 valve.     Electronically Signed   By: Soyla Merck M.D.   On: 08/01/2023 17:51    Addended by Merck Soyla LABOR, MD on 08/01/2023  5:53 PM  ADDENDUM REPORT: 07/10/2023 16:32   EXAM: OVER-READ INTERPRETATION  CT CHEST   The following report is an over-read performed by radiologist Dr. Kate Freiberg [facility] on 07/10/2023. This over-read does not include interpretation of cardiac or coronary anatomy or pathology. The cardiac TAVR CT interpretation by the cardiologist is attached.   COMPARISON:  CT abdomen pelvis 01/07/2022, CT chest 05/19/2015, CT angiography chest 07/04/2023   FINDINGS: No filling defects of the Jewel eyes pulmonary arteries.   Stable 9 x 5 mm right middle lobe pulmonary nodule (306:20). Stable subpleural right middle lobe 8 x 5 mm (306:22). Stable right lower lobe 4 mm subpleural nodule (306:39). Stable slightly more superior subpleural right lower lobe pulmonary nodule measuring 5 mm (306:23). No further evaluation of these chronic nodules is indicated. No new  pulmonary nodule. Chronic calcified pleural plaques are again noted.   Slightly more prominent and interval increase in size of a right peribronchovascular 1.4 x 1 cm (from 1.1 by 1.1 cm) soft tissue density (304:94). Associated prominent but nonenlarged right hilar, left hilar, and mediastinal lymph nodes.   Persistent but nonenlarged right hilar, left hilar, and mediastinal lymph nodes.   No acute upper abdominal abnormality.   No acute osseous abnormality. Multilevel osteophyte formation. Intact sternotomy wires.   No acute soft tissue abnormality.   IMPRESSION: 1. Slightly more prominent and interval increase in size of a right peribronchovascular 1.4 x  1 cm (from 1.1 by 1.1 cm) soft tissue density. Recommend follow-up CT with intravenous contrast in 3 months to further evaluate stability. Additional imaging evaluation or consultation with Pulmonology or Thoracic Surgery recommended. 2. Persistent but nonenlarged right hilar, left hilar, and mediastinal lymph nodes. Recommend attention on follow-up.     Electronically Signed   By: Morgane  Naveau M.D.   On: 07/10/2023 16:32    Addended by Naveau, Morgane E, MD on 07/10/2023  4:34 PM    Study Result   Narrative & Impression  CLINICAL DATA:  Aortic valve replacement (TAVR), pre-op eval   EXAM: Cardiac TAVR CT   TECHNIQUE: A non-contrast, gated CT scan was obtained with axial slices of 2.5 mm through the heart for aortic valve scoring. A 120 kV retrospective, gated, contrast cardiac scan was obtained. Gantry rotation speed was 230 msec and collimation was 0.63 mm. Nitroglycerin  was not given. A delayed scan was obtained to exclude left atrial appendage thrombus. The 3D dataset was reconstructed in systole with motion correction. The 3D data set was reconstructed in 5% intervals of the 0-95% of the R-R cycle. Systolic and diastolic phases were analyzed on a dedicated workstation using MPR, MIP, and VRT modes. The  patient received 100 cc of contrast.   FINDINGS: Aortic Valve:   Tricuspid aortic valve with severely reduced cusp excursion. Severely thickened and severely calcified aortic valve cusps.   AV calcium  score: 2249   Virtual Basal Annulus Measurements:   Maximum/Minimum Diameter: 23.9 x 22.1 mm   Perimeter: 72.2 mm   Area:  406 mm2   No significant LVOT calcifications.   Membranous septal length: 5.5 mm   Based on these measurements, the annulus would be suitable for a 26 mm Sapien 3 valve. Alternatively, Heart Team can consider 26 or 29 mm Evolut valve (perimeter on borderline for valve type). Recommend Heart Team discussion for valve selection.   Sinus of Valsalva Measurements:   Non-coronary:  31 mm   Right - coronary:  31 mm   Left - coronary:  33 mm   Sinus of Valsalva Height:   Left: 23 mm   Right: 21 mm   Aorta: Conventional 3 vessel branch pattern of aortic arch.   Sinotubular Junction:  27 mm   Ascending Thoracic Aorta:  34 mm   Aortic Arch:  27 mm   Descending Thoracic Aorta:  24 mm   Coronary Artery Height above Annulus:   Left main: 13 mm   Right coronary: 18 mm   Coronary Arteries: Normal coronary origin. Right dominance. The study was performed without use of NTG and insufficient for plaque evaluation. Prior CABG.   Optimum Fluoroscopic Angle for Delivery: RAO 5 CRA 4   OTHER:   Atria: Grossly normal.  Lipomatous hypertrophy of atrial septum.   Left atrial appendage: No thrombus.   Mitral valve: Grossly normal, no mitral annular calcifications.   Pulmonary artery: Normal caliber.   Pulmonary veins: Normal variant anatomy (3 left 2 right).   IMPRESSION: 1. Tricuspid aortic valve with severely reduced cusp excursion. Severely thickened and severely calcified aortic valve cusps. 2. Aortic valve calcium  score: 2249 3. Annulus area: 406 mm2, suitable for 26 mm Sapien 3 valve. No LVOT calcifications. Membranous septal length 5.5  mm. 4. Sufficient coronary artery heights from annulus. 5. Optimum fluoroscopic angle for delivery: RAO 5 CRA 4   Electronically Signed: By: Soyla Merck M.D. On: 07/04/2023 14:00        Narrative & Impression  CLINICAL DATA:  Aortic valve replacement, preoperative evaluation   EXAM: CTA ABDOMEN AND PELVIS WITHOUT AND WITH CONTRAST   TECHNIQUE: Multidetector CT imaging of the abdomen and pelvis was performed using the standard protocol during bolus administration of intravenous contrast. Multiplanar reconstructed images and MIPs were obtained and reviewed to evaluate the vascular anatomy.   RADIATION DOSE REDUCTION: This exam was performed according to the departmental dose-optimization program which includes automated exposure control, adjustment of the mA and/or kV according to patient size and/or use of iterative reconstruction technique.   CONTRAST:  OMNIPAQUE  IOHEXOL  350 MG/ML SOLN   COMPARISON:  None Available.   FINDINGS: VASCULAR   Aorta: Patent with mild atherosclerotic changes. Minimal diameter measures 12 mm.   Celiac: Patent.   SMA: Patent.   Renals: Moderate stenosis in the right main renal artery estimated at 75%. Moderate stenosis in the left main renal artery estimated between 50 and 75%.   IMA: Patent   Inflow: Mild atherosclerotic changes in the proximal right internal carotid artery with estimated minimal diameter of 5 mm. The right internal iliac artery is patent. The right external iliac artery is patent with minimal diameter estimated at 9 mm. The left common iliac artery is patent with minimal diameter of 9 mm. Left internal iliac artery is patent. Left external iliac artery is patent with minimal diameter of 8 mm.   Proximal Outflow: The right common femoral artery is patent with minimal diameter estimated at 7 mm. The left common femoral artery is patent with minimal diameter estimated at 7 mm.   Veins: No obvious venous  abnormality within the limitations of this arterial phase study.   Review of the MIP images confirms the above findings.   NON-VASCULAR   Lower chest: Reported separately.   Hepatobiliary: Layering gallstones.   Pancreas: Unremarkable. No pancreatic ductal dilatation or surrounding inflammatory changes.   Spleen: Normal in size without focal abnormality.   Adrenals/Urinary Tract: Adrenal glands are unremarkable. Kidneys are normal, without renal calculi, focal lesion, or hydronephrosis. Bladder is unremarkable.   Stomach/Bowel: Stomach is within normal limits. Appendix appears normal. No evidence of bowel wall thickening, distention, or inflammatory changes.   Lymphatic: No lymphadenopathy.   Reproductive: Mildly enlarged prostate.   Other: Nothing significant.   Musculoskeletal: Postsurgical changes from posterior spinal fusion with associated degenerative spondylosis.   IMPRESSION: 1. Minimal access vessel diameters detailed above. 2. Renal atherosclerotic vascular disease with moderate bilateral renal artery stenosis.     Electronically Signed   By: Maude Naegeli M.D.   On: 07/06/2023 13:12        Impression:   This 88 year old gentleman has stage D2, severe, symptomatic, paradoxical low-flow/low gradient aortic stenosis with NYHA class III symptoms of exertional fatigue and shortness of breath consistent with chronic diastolic congestive heart failure.  I have personally reviewed his 2D echocardiogram, cardiac catheterization, and CTA studies.  His echo in February 2025 shows a severely calcified and thickened aortic valve with a mean gradient of 25 mmHg but an aortic valve area of 0.83 cm.  Cardiac catheterization showed a patent LIMA to the LAD and a high-grade mid left circumflex stenosis successfully treated with PCI.  I agree that aortic valve replacement is indicated in this patient for relief of his symptoms and to prevent progressive left ventricular  dysfunction.  Given his advanced age and prior coronary bypass surgery I think transcatheter aortic valve replacement be the best option for treating him.  His gated cardiac CTA shows anatomy  suitable for TAVR using a 23 mm SAPIEN 3 valve.  His abdominal and pelvic CTA shows adequate pelvic vascular anatomy to allow transfemoral insertion.  His electrocardiogram shows sinus rhythm with right bundle branch block and therefore he is at increased risk for complete heart block.  We would plan to use right IJ pacing.   The patient and his family were counseled at length regarding treatment alternatives for management of severe symptomatic aortic stenosis. The risks and benefits of surgical intervention has been discussed in detail. Long-term prognosis with medical therapy was discussed. Alternative approaches such as conventional surgical aortic valve replacement, transcatheter aortic valve replacement, and palliative medical therapy were compared and contrasted at length. This discussion was placed in the context of the patient's own specific clinical presentation and past medical history. All of their questions have been addressed.    Following the decision to proceed with transcatheter aortic valve replacement, a discussion was held regarding what types of management strategies would be attempted intraoperatively in the event of life-threatening complications, including whether or not the patient would be considered a candidate for the use of cardiopulmonary bypass and/or conversion to open sternotomy for attempted surgical intervention.  Given his advanced age and prior coronary bypass surgery I do not think he is a candidate for emergent sternotomy to manage any intraoperative complications.  The patient has been advised of a variety of complications that might develop including but not limited to risks of death, stroke, paravalvular leak, aortic dissection or other major vascular complications, aortic annulus  rupture, device embolization, cardiac rupture or perforation, mitral regurgitation, acute myocardial infarction, arrhythmia, heart block or bradycardia requiring permanent pacemaker placement, congestive heart failure, respiratory failure, renal failure, pneumonia, infection, other late complications related to structural valve deterioration or migration, or other complications that might ultimately cause a temporary or permanent loss of functional independence or other long term morbidity. The patient provides full informed consent for the procedure as described and all questions were answered.       Plan:   Transfemoral TAVR using a SAPIEN 3 valve with right IJ pacing.     Dorise LOIS Fellers, MD

## 2023-08-09 ENCOUNTER — Inpatient Hospital Stay (HOSPITAL_COMMUNITY): Admitting: Anesthesiology

## 2023-08-09 ENCOUNTER — Inpatient Hospital Stay (HOSPITAL_COMMUNITY)

## 2023-08-09 ENCOUNTER — Encounter (HOSPITAL_COMMUNITY)
Admission: RE | Disposition: A | Discharge status: Home / Self Care | Source: Home / Self Care | Attending: Cardiovascular Disease

## 2023-08-09 ENCOUNTER — Encounter (HOSPITAL_COMMUNITY): Payer: Self-pay | Admitting: Cardiovascular Disease

## 2023-08-09 ENCOUNTER — Inpatient Hospital Stay (HOSPITAL_COMMUNITY)
Admission: RE | Admit: 2023-08-09 | Discharge: 2023-08-10 | DRG: 267 | Disposition: A | Discharge status: Home / Self Care | Attending: Cardiovascular Disease | Admitting: Cardiovascular Disease

## 2023-08-09 ENCOUNTER — Inpatient Hospital Stay (HOSPITAL_COMMUNITY): Admitting: Vascular Surgery

## 2023-08-09 ENCOUNTER — Other Ambulatory Visit: Payer: Self-pay | Admitting: Physician Assistant

## 2023-08-09 DIAGNOSIS — C679 Malignant neoplasm of bladder, unspecified: Secondary | ICD-10-CM | POA: Diagnosis present

## 2023-08-09 DIAGNOSIS — I350 Nonrheumatic aortic (valve) stenosis: Principal | ICD-10-CM

## 2023-08-09 DIAGNOSIS — I440 Atrioventricular block, first degree: Secondary | ICD-10-CM | POA: Diagnosis not present

## 2023-08-09 DIAGNOSIS — Z7984 Long term (current) use of oral hypoglycemic drugs: Secondary | ICD-10-CM

## 2023-08-09 DIAGNOSIS — Z7902 Long term (current) use of antithrombotics/antiplatelets: Secondary | ICD-10-CM

## 2023-08-09 DIAGNOSIS — Z833 Family history of diabetes mellitus: Secondary | ICD-10-CM | POA: Diagnosis not present

## 2023-08-09 DIAGNOSIS — E1129 Type 2 diabetes mellitus with other diabetic kidney complication: Secondary | ICD-10-CM

## 2023-08-09 DIAGNOSIS — M19022 Primary osteoarthritis, left elbow: Secondary | ICD-10-CM | POA: Diagnosis not present

## 2023-08-09 DIAGNOSIS — N400 Enlarged prostate without lower urinary tract symptoms: Secondary | ICD-10-CM | POA: Diagnosis present

## 2023-08-09 DIAGNOSIS — M109 Gout, unspecified: Secondary | ICD-10-CM | POA: Diagnosis present

## 2023-08-09 DIAGNOSIS — Z952 Presence of prosthetic heart valve: Secondary | ICD-10-CM | POA: Diagnosis not present

## 2023-08-09 DIAGNOSIS — Z885 Allergy status to narcotic agent status: Secondary | ICD-10-CM

## 2023-08-09 DIAGNOSIS — Z841 Family history of disorders of kidney and ureter: Secondary | ICD-10-CM

## 2023-08-09 DIAGNOSIS — I130 Hypertensive heart and chronic kidney disease with heart failure and stage 1 through stage 4 chronic kidney disease, or unspecified chronic kidney disease: Secondary | ICD-10-CM | POA: Diagnosis not present

## 2023-08-09 DIAGNOSIS — I4510 Unspecified right bundle-branch block: Secondary | ICD-10-CM | POA: Diagnosis present

## 2023-08-09 DIAGNOSIS — Z8249 Family history of ischemic heart disease and other diseases of the circulatory system: Secondary | ICD-10-CM

## 2023-08-09 DIAGNOSIS — I5042 Chronic combined systolic (congestive) and diastolic (congestive) heart failure: Secondary | ICD-10-CM | POA: Diagnosis present

## 2023-08-09 DIAGNOSIS — E1122 Type 2 diabetes mellitus with diabetic chronic kidney disease: Secondary | ICD-10-CM | POA: Diagnosis present

## 2023-08-09 DIAGNOSIS — Z8551 Personal history of malignant neoplasm of bladder: Secondary | ICD-10-CM

## 2023-08-09 DIAGNOSIS — Z7982 Long term (current) use of aspirin: Secondary | ICD-10-CM | POA: Diagnosis not present

## 2023-08-09 DIAGNOSIS — N1832 Chronic kidney disease, stage 3b: Secondary | ICD-10-CM | POA: Diagnosis not present

## 2023-08-09 DIAGNOSIS — F17210 Nicotine dependence, cigarettes, uncomplicated: Secondary | ICD-10-CM

## 2023-08-09 DIAGNOSIS — I10 Essential (primary) hypertension: Secondary | ICD-10-CM | POA: Diagnosis not present

## 2023-08-09 DIAGNOSIS — Z006 Encounter for examination for normal comparison and control in clinical research program: Secondary | ICD-10-CM

## 2023-08-09 DIAGNOSIS — Z951 Presence of aortocoronary bypass graft: Secondary | ICD-10-CM

## 2023-08-09 DIAGNOSIS — Z955 Presence of coronary angioplasty implant and graft: Secondary | ICD-10-CM

## 2023-08-09 DIAGNOSIS — I701 Atherosclerosis of renal artery: Secondary | ICD-10-CM | POA: Diagnosis present

## 2023-08-09 DIAGNOSIS — Z888 Allergy status to other drugs, medicaments and biological substances status: Secondary | ICD-10-CM

## 2023-08-09 DIAGNOSIS — M7022 Olecranon bursitis, left elbow: Secondary | ICD-10-CM | POA: Diagnosis not present

## 2023-08-09 DIAGNOSIS — D72829 Elevated white blood cell count, unspecified: Secondary | ICD-10-CM | POA: Diagnosis not present

## 2023-08-09 DIAGNOSIS — M7989 Other specified soft tissue disorders: Secondary | ICD-10-CM | POA: Diagnosis not present

## 2023-08-09 DIAGNOSIS — I5022 Chronic systolic (congestive) heart failure: Secondary | ICD-10-CM

## 2023-08-09 DIAGNOSIS — Z8261 Family history of arthritis: Secondary | ICD-10-CM

## 2023-08-09 DIAGNOSIS — I2510 Atherosclerotic heart disease of native coronary artery without angina pectoris: Secondary | ICD-10-CM

## 2023-08-09 DIAGNOSIS — Z79899 Other long term (current) drug therapy: Secondary | ICD-10-CM | POA: Diagnosis not present

## 2023-08-09 DIAGNOSIS — E785 Hyperlipidemia, unspecified: Secondary | ICD-10-CM | POA: Diagnosis not present

## 2023-08-09 DIAGNOSIS — M778 Other enthesopathies, not elsewhere classified: Secondary | ICD-10-CM | POA: Diagnosis not present

## 2023-08-09 DIAGNOSIS — M25522 Pain in left elbow: Secondary | ICD-10-CM | POA: Diagnosis not present

## 2023-08-09 DIAGNOSIS — E119 Type 2 diabetes mellitus without complications: Secondary | ICD-10-CM

## 2023-08-09 HISTORY — DX: Peripheral vascular disease, unspecified: I73.9

## 2023-08-09 HISTORY — DX: Nonrheumatic aortic (valve) stenosis: I35.0

## 2023-08-09 HISTORY — DX: Unspecified systolic (congestive) heart failure: I50.20

## 2023-08-09 HISTORY — PX: INTRAOPERATIVE TRANSTHORACIC ECHOCARDIOGRAM: SHX6523

## 2023-08-09 HISTORY — DX: Chronic kidney disease, stage 3b: N18.32

## 2023-08-09 HISTORY — DX: Chronic systolic (congestive) heart failure: I50.22

## 2023-08-09 LAB — POCT I-STAT, CHEM 8
BUN: 22 mg/dL (ref 8–23)
Calcium, Ion: 1.09 mmol/L — ABNORMAL LOW (ref 1.15–1.40)
Chloride: 108 mmol/L (ref 98–111)
Creatinine, Ser: 1.2 mg/dL (ref 0.61–1.24)
Glucose, Bld: 143 mg/dL — ABNORMAL HIGH (ref 70–99)
HCT: 24 % — ABNORMAL LOW (ref 39.0–52.0)
Hemoglobin: 8.2 g/dL — ABNORMAL LOW (ref 13.0–17.0)
Potassium: 3.2 mmol/L — ABNORMAL LOW (ref 3.5–5.1)
Sodium: 144 mmol/L (ref 135–145)
TCO2: 19 mmol/L — ABNORMAL LOW (ref 22–32)

## 2023-08-09 LAB — ECHOCARDIOGRAM LIMITED
AR max vel: 1 cm2
AV Area VTI: 0.77 cm2
AV Area mean vel: 0.9 cm2
AV Mean grad: 20 mmHg
AV Peak grad: 25.7 mmHg
Ao pk vel: 2.54 m/s
Est EF: 50

## 2023-08-09 LAB — GLUCOSE, CAPILLARY
Glucose-Capillary: 166 mg/dL — ABNORMAL HIGH (ref 70–99)
Glucose-Capillary: 132 mg/dL — ABNORMAL HIGH (ref 70–99)
Glucose-Capillary: 125 mg/dL — ABNORMAL HIGH (ref 70–99)
Glucose-Capillary: 155 mg/dL — ABNORMAL HIGH (ref 70–99)
Glucose-Capillary: 193 mg/dL — ABNORMAL HIGH (ref 70–99)

## 2023-08-09 LAB — POCT ACTIVATED CLOTTING TIME: Activated Clotting Time: 250 s

## 2023-08-09 MED ORDER — SODIUM CHLORIDE 0.9% FLUSH
3.0000 mL | Freq: Two times a day (BID) | INTRAVENOUS | Status: DC
  Administered 2023-08-09 – 2023-08-10 (×2): 3 mL via INTRAVENOUS

## 2023-08-09 MED ORDER — CHLORHEXIDINE GLUCONATE 4 % EX SOLN: 30.0000 mL | CUTANEOUS | Status: DC

## 2023-08-09 MED ORDER — CHLORHEXIDINE GLUCONATE 4 % EX SOLN: 60.0000 mL | Freq: Once | CUTANEOUS | Status: DC

## 2023-08-09 MED ORDER — PITAVASTATIN MAGNESIUM 4 MG PO TABS: 1.0000 | ORAL_TABLET | Freq: Every day | ORAL | Status: DC

## 2023-08-09 MED ORDER — ACETAMINOPHEN 650 MG RE SUPP: 650.0000 mg | Freq: Four times a day (QID) | RECTAL | Status: DC | PRN

## 2023-08-09 MED ORDER — SODIUM CHLORIDE 0.9 % IV SOLN: 250.0000 mL | INTRAVENOUS | Status: DC | PRN

## 2023-08-09 MED ORDER — LIDOCAINE HCL (PF) 1 % IJ SOLN
INTRAMUSCULAR | Status: DC | PRN
  Administered 2023-08-09: 12 mL
  Administered 2023-08-09: 2 mL
  Administered 2023-08-09: 12 mL

## 2023-08-09 MED ORDER — ACETAMINOPHEN 325 MG PO TABS
650.0000 mg | ORAL_TABLET | Freq: Four times a day (QID) | ORAL | Status: DC | PRN
  Administered 2023-08-10: 650 mg via ORAL
  Filled 2023-08-09: qty 2

## 2023-08-09 MED ORDER — POTASSIUM CHLORIDE CRYS ER 20 MEQ PO TBCR
40.0000 meq | EXTENDED_RELEASE_TABLET | Freq: Once | ORAL | Status: AC
Start: 2023-08-09 — End: 2023-08-09
  Administered 2023-08-09: 40 meq via ORAL
  Filled 2023-08-09: qty 2

## 2023-08-09 MED ORDER — TRAMADOL HCL 50 MG PO TABS: 50.0000 mg | ORAL_TABLET | ORAL | Status: DC | PRN

## 2023-08-09 MED ORDER — HEPARIN (PORCINE) IN NACL 1000-0.9 UT/500ML-% IV SOLN
INTRAVENOUS | Status: DC | PRN
  Administered 2023-08-09: 500 mL

## 2023-08-09 MED ORDER — HEPARIN (PORCINE) IN NACL 2000-0.9 UNIT/L-% IV SOLN
INTRAVENOUS | Status: DC | PRN
  Administered 2023-08-09: 1000 mL

## 2023-08-09 MED ORDER — NOREPINEPHRINE 4 MG/250ML-% IV SOLN: 0.0000 ug/min | INTRAVENOUS | Status: DC

## 2023-08-09 MED ORDER — SODIUM CHLORIDE 0.9% FLUSH: 3.0000 mL | INTRAVENOUS | Status: DC | PRN

## 2023-08-09 MED ORDER — OXYCODONE HCL 5 MG PO TABS
5.0000 mg | ORAL_TABLET | ORAL | Status: DC | PRN
  Administered 2023-08-10: 10 mg via ORAL
  Filled 2023-08-09 (×2): qty 1

## 2023-08-09 MED ORDER — SODIUM CHLORIDE 0.9 % IV SOLN: INTRAVENOUS | Status: AC

## 2023-08-09 MED ORDER — INSULIN ASPART 100 UNIT/ML IJ SOLN
0.0000 [IU] | Freq: Three times a day (TID) | INTRAMUSCULAR | Status: DC
  Administered 2023-08-09: 4 [IU] via SUBCUTANEOUS
  Administered 2023-08-10: 8 [IU] via SUBCUTANEOUS
  Administered 2023-08-10: 2 [IU] via SUBCUTANEOUS

## 2023-08-09 MED ORDER — HEPARIN SODIUM (PORCINE) 1000 UNIT/ML IJ SOLN
INTRAMUSCULAR | Status: DC | PRN
  Administered 2023-08-09: 13500 [IU] via INTRAVENOUS
  Administered 2023-08-09: 3000 [IU] via INTRAVENOUS

## 2023-08-09 MED ORDER — NITROGLYCERIN IN D5W 200-5 MCG/ML-% IV SOLN: 0.0000 ug/min | INTRAVENOUS | Status: DC

## 2023-08-09 MED ORDER — ONDANSETRON HCL 4 MG/2ML IJ SOLN: 4.0000 mg | Freq: Four times a day (QID) | INTRAMUSCULAR | Status: DC | PRN

## 2023-08-09 MED ORDER — IODIXANOL 320 MG/ML IV SOLN
INTRAVENOUS | Status: DC | PRN
  Administered 2023-08-09: 40 mL

## 2023-08-09 MED ORDER — LACTATED RINGERS IV SOLN: INTRAVENOUS | Status: DC | PRN

## 2023-08-09 MED ORDER — ASPIRIN 81 MG PO TBEC
81.0000 mg | DELAYED_RELEASE_TABLET | Freq: Every day | ORAL | Status: DC
  Administered 2023-08-10: 81 mg via ORAL
  Filled 2023-08-09: qty 1

## 2023-08-09 MED ORDER — LACTATED RINGERS IV SOLN: INTRAVENOUS | Status: DC

## 2023-08-09 MED ORDER — GABAPENTIN 300 MG PO CAPS: 300.0000 mg | ORAL_CAPSULE | Freq: Every day | ORAL | Status: DC | PRN

## 2023-08-09 MED ORDER — INSULIN ASPART 100 UNIT/ML IJ SOLN: 0.0000 [IU] | INTRAMUSCULAR | Status: DC | PRN

## 2023-08-09 MED ORDER — CEFAZOLIN SODIUM-DEXTROSE 2-4 GM/100ML-% IV SOLN
2.0000 g | Freq: Three times a day (TID) | INTRAVENOUS | Status: AC
  Administered 2023-08-09 – 2023-08-10 (×2): 2 g via INTRAVENOUS
  Filled 2023-08-09 (×2): qty 100

## 2023-08-09 MED ORDER — SODIUM CHLORIDE 0.9 % IV SOLN: INTRAVENOUS | Status: DC

## 2023-08-09 MED ORDER — PROTAMINE SULFATE 10 MG/ML IV SOLN
INTRAVENOUS | Status: DC | PRN
  Administered 2023-08-09: 50 mg via INTRAVENOUS

## 2023-08-09 MED ORDER — CHLORHEXIDINE GLUCONATE 0.12 % MT SOLN
15.0000 mL | Freq: Once | OROMUCOSAL | Status: AC
  Administered 2023-08-09: 15 mL via OROMUCOSAL
  Filled 2023-08-09: qty 15

## 2023-08-09 MED ORDER — CLOPIDOGREL BISULFATE 75 MG PO TABS
75.0000 mg | ORAL_TABLET | Freq: Every day | ORAL | Status: DC
Start: 2023-08-10 — End: 2023-08-10
  Administered 2023-08-10: 75 mg via ORAL
  Filled 2023-08-09: qty 1

## 2023-08-09 MED ORDER — SODIUM CHLORIDE 0.9 % IV SOLN: INTRAVENOUS | Status: DC | PRN

## 2023-08-09 NOTE — Interval H&P Note (Signed)
 History and Physical Interval Note:  08/09/2023 10:09 AM  Jeff Burgess  has presented today for surgery, with the diagnosis of Severe Aortic Stenosis.  The various methods of treatment have been discussed with the patient and family. After consideration of risks, benefits and other options for treatment, the patient has consented to  Procedure(s): Transcatheter Aortic Valve Replacement, Transfemoral (N/A) ECHOCARDIOGRAM, TRANSTHORACIC (N/A) as a surgical intervention.  The patient's history has been reviewed, patient examined, no change in status, stable for surgery.  I have reviewed the patient's chart and labs.  Questions were answered to the patient's satisfaction.     [name] K Rusty Villella

## 2023-08-09 NOTE — Progress Notes (Signed)
  [facility]   MULTIDISCIPLINARY HEART VALVE TEAM  Patient doing well s/p TAVR. He is hemodynamically stable. Groin sites stable. ECG with sinus brady, old RBBB and new first degree AV block.  Plan to transfer to from cath lab holding to 4E when bed available. K is 3.2 and given 40 meq Kdur. Early ambulation after bedrest completed and hopeful discharge over the next 24-48 hours.   Lamarr Hummer PA-C  [facility]  Pager [phone]

## 2023-08-09 NOTE — Op Note (Signed)
 [facility]   MULTIDISCIPLINARY HEART VALVE TEAM   TAVR OPERATIVE NOTE   Date of Procedure:  08/09/2023  Preoperative Diagnosis: Severe Aortic Stenosis   Postoperative Diagnosis: Same   Procedure:   Transcatheter Aortic Valve Replacement - Percutaneous Transfemoral Approach  Edwards Sapien 3 Ultra Resilia THV (size 23 mm, serial # [id number])   Co-Surgeons:  Dorise Fellers, MD and [name], MD  Anesthesiologist:  Lonni Custard, MD  Echocardiographer:  Stanly Leavens, MD  Pre-operative Echo Findings: Severe aortic stenosis Normal left ventricular systolic function  Post-operative Echo Findings: No paravalvular leak Normal/unchanged left ventricular systolic function  BRIEF CLINICAL NOTE AND INDICATIONS FOR SURGERY  88 year old male with severe, stage D3, paradoxical low-flow low gradient aortic stenosis with near card association functional class III symptoms of exertional fatigue and shortness of breath consistent with diastolic heart failure.  After review of all preoperative imaging studies including cardiac catheterization that demonstrated patency of the LIMA to LAD graft and severe stenosis of the circumflex treated with PCI, the patient is felt to be a suitable candidate for TAVR using a 23 mm Edwards SAPIEN 3 valve.  During the course of the patient's preoperative work up they have been evaluated comprehensively by a multidisciplinary team of specialists coordinated through the Multidisciplinary Heart Valve Clinic in the [facility].  They have been demonstrated to suffer from symptomatic severe aortic stenosis as noted above. The patient has been counseled extensively as to the relative risks and benefits of all options for the treatment of severe aortic stenosis including long term medical therapy, conventional surgery for aortic valve replacement, and transcatheter aortic valve replacement.  The patient has been  independently evaluated in formal cardiac surgical consultation by Dr Fellers, who deemed the patient appropriate for TAVR. Based upon review of all of the patient's preoperative diagnostic tests they are felt to be candidate for transcatheter aortic valve replacement using the transfemoral approach as an alternative to conventional surgery.    Following the decision to proceed with transcatheter aortic valve replacement, a discussion has been held regarding what types of management strategies would be attempted intraoperatively in the event of life-threatening complications, including whether or not the patient would be considered a candidate for the use of cardiopulmonary bypass and/or conversion to open sternotomy for attempted surgical intervention.  The patient has been advised of a variety of complications that might develop peculiar to this approach including but not limited to risks of death, stroke, paravalvular leak, aortic dissection or other major vascular complications, aortic annulus rupture, device embolization, cardiac rupture or perforation, acute myocardial infarction, arrhythmia, heart block or bradycardia requiring permanent pacemaker placement, congestive heart failure, respiratory failure, renal failure, pneumonia, infection, other late complications related to structural valve deterioration or migration, or other complications that might ultimately cause a temporary or permanent loss of functional independence or other long term morbidity.  The patient provides full informed consent for the procedure as described and all questions were answered preoperatively.  DETAILS OF THE OPERATIVE PROCEDURE  PREPARATION:   The patient is brought to the operating room on the above mentioned date and central monitoring was established by the anesthesia team. The patient is placed in the supine position on the operating table.  Intravenous antibiotics are administered. The patient is monitored closely  throughout the procedure under conscious sedation.  Baseline transthoracic echocardiogram is performed. The patient's chest, abdomen, both groins, and both lower extremities are prepared and draped in a sterile manner. A  time out procedure is performed.   PERIPHERAL ACCESS:   Using ultrasound guidance, femoral arterial access is obtained with placement of 6 Fr sheaths on the right side.  US  images are digitally captured and stored in the patient's chart. A pigtail diagnostic catheter was passed through the femoral arterial sheath under fluoroscopic guidance into the aortic root.  Using micropuncture technique and vascular ultrasound guidance, the right internal jugular vein is accessed and a 6 French sheath is inserted.  A temporary transvenous pacemaker catheter was passed through the right IJ venous sheath under fluoroscopic guidance into the right ventricle.  The pacemaker was tested to ensure stable lead placement and pacemaker capture. Aortic root angiography was performed in order to determine the optimal angiographic angle for valve deployment.  TRANSFEMORAL ACCESS:  A micropuncture technique is used to access the left femoral artery under fluoroscopic and ultrasound guidance.  2 Perclose devices are deployed at 10' and 2' positions to 'PreClose' the femoral artery. An 8 French sheath is placed and then an Amplatz Superstiff wire is advanced through the sheath. This is changed out for a 14 French transfemoral E-Sheath after progressively dilating over the Superstiff wire.  An AL-2 catheter was used to direct a straight-tip exchange length wire across the native aortic valve into the left ventricle. This was exchanged out for a pigtail catheter and position was confirmed in the LV apex. Simultaneous LV and Ao pressures were recorded.  The pigtail catheter was exchanged for a Safari wire in the LV apex.    BALLOON AORTIC VALVULOPLASTY:  Not performed  TRANSCATHETER HEART VALVE DEPLOYMENT:  An  Edwards Sapien 3 Ultra Resilia transcatheter heart valve (size 23 mm) was prepared and crimped per manufacturer's guidelines, and the proper orientation of the valve is confirmed on the [employer] delivery system. The valve was advanced through the introducer sheath using normal technique until in an appropriate position in the abdominal aorta beyond the sheath tip. The balloon was then retracted and using the fine-tuning wheel was centered on the valve. The valve was then advanced across the aortic arch using appropriate flexion of the catheter. The valve was carefully positioned across the aortic valve annulus. The Commander catheter was retracted using normal technique. Once final position of the valve has been confirmed by angiographic assessment, the valve is deployed while temporarily holding ventilation and during rapid ventricular pacing to maintain systolic blood pressure < 50 mmHg and pulse pressure < 10 mmHg. The balloon inflation is held for >3 seconds after reaching full deployment volume. Once the balloon has fully deflated the balloon is retracted into the ascending aorta and valve function is assessed using echocardiography. The patient's hemodynamic recovery following valve deployment is good.  The deployment balloon and guidewire are both removed. Echo demostrated acceptable post-procedural gradients, stable mitral valve function, and no aortic insufficiency.    PROCEDURE COMPLETION:  The sheath was removed and femoral artery closure is performed using the 2 previously deployed Perclose devices.  Protamine  is administered once femoral arterial repair was complete. The site is clear with no evidence of bleeding or hematoma after the sutures are tightened. The temporary pacemaker and pigtail catheters are removed. Mynx closure is used for contralateral femoral arterial hemostasis for the 6 Fr sheath.  The patient tolerated the procedure well and is transported to the recovery area in  stable condition. There were no immediate intraoperative complications. All sponge instrument and needle counts are verified correct at completion of the operation.   The patient received  a total of 45 mL of intravenous contrast during the procedure.  EBL: minimal  LVEDP: 18 mmHg   [name], MD 08/09/2023 3:42 PM

## 2023-08-09 NOTE — Transfer of Care (Signed)
 Immediate Anesthesia Transfer of Care Note  Patient: Jeff Burgess  Procedure(s) Performed: Transcatheter Aortic Valve Replacement, Transfemoral ECHOCARDIOGRAM, TRANSTHORACIC  Patient Location: PACU  Anesthesia Type:MAC  Level of Consciousness: alert , drowsy, patient cooperative, and responds to stimulation  Airway & Oxygen Therapy: Patient Spontanous Breathing and Patient connected to nasal cannula oxygen  Post-op Assessment: Report given to RN and Post -op Vital signs reviewed and stable  Post vital signs: Reviewed and stable  Last Vitals:  Vitals Value Taken Time  BP 117/52 08/09/23 1517  Temp    Pulse 48 08/09/23 1517  Resp 14 08/09/23 1517  SpO2 99% 08/09/23 1517    Pt denies pain     Complications: There were no known notable events for this encounter.

## 2023-08-09 NOTE — Discharge Instructions (Signed)

## 2023-08-09 NOTE — Progress Notes (Signed)
 Pt received from Cath lab, V/S obtained, CCMD notified, B/L groins are C/D/I, denies any pain, all needs met, call bell in reach.   08/09/23 1800  Vitals  Temp 97.6 F (36.4 C)  Temp Source Oral  BP (!) 134/59  MAP (mmHg) 81  BP Location Right Arm  BP Method Automatic  Patient Position (if appropriate) Lying  Pulse Rate (!) 53  Pulse Rate Source Monitor  ECG Heart Rate 62  Resp 19  Level of Consciousness  Level of Consciousness Alert  MEWS COLOR  MEWS Score Color Green  Oxygen Therapy  SpO2 92 %  O2 Device Room Air  Pain Assessment  Pain Scale 0-10  Pain Score 0  MEWS Score  MEWS Temp 0  MEWS Systolic 0  MEWS Pulse 0  MEWS RR 0  MEWS LOC 0  MEWS Score 0

## 2023-08-09 NOTE — Anesthesia Procedure Notes (Signed)
 Procedure Name: MAC Date/Time: 08/09/2023 2:03 PM  Performed by: Arvell Edsel HERO, CRNAPre-anesthesia Checklist: Emergency Drugs available, Patient identified, Suction available, Patient being monitored and Timeout performed Patient Re-evaluated:Patient Re-evaluated prior to induction Oxygen Delivery Method: Simple face mask

## 2023-08-09 NOTE — Anesthesia Postprocedure Evaluation (Signed)
 Anesthesia Post Note  Patient: Jeff Burgess  Procedure(s) Performed: Transcatheter Aortic Valve Replacement, Transfemoral ECHOCARDIOGRAM, TRANSTHORACIC     Patient location during evaluation: Nursing Unit Anesthesia Type: MAC Level of consciousness: awake and patient cooperative Pain management: pain level controlled Vital Signs Assessment: post-procedure vital signs reviewed and stable Respiratory status: spontaneous breathing, nonlabored ventilation and respiratory function stable Cardiovascular status: stable and blood pressure returned to baseline Postop Assessment: no apparent nausea or vomiting Anesthetic complications: no   There were no known notable events for this encounter.  Last Vitals:  Vitals:   08/09/23 1600 08/09/23 1605  BP: (!) 103/58 (!) 115/51  Pulse: (!) 46 (!) 47  Resp: (!) 21 (!) 27  Temp:    SpO2: 95% 96%    Last Pain:  Vitals:   08/09/23 1514  TempSrc: Axillary  PainSc: 0-No pain                 Robbin Loughmiller

## 2023-08-09 NOTE — Anesthesia Preprocedure Evaluation (Addendum)
 Anesthesia Evaluation  Patient identified by MRN, date of birth, ID band Patient awake    Reviewed: Allergy & Precautions, NPO status , Patient's Chart, lab work & pertinent test results  History of Anesthesia Complications Negative for: history of anesthetic complications  Airway Mallampati: II  TM Distance: >3 FB Neck ROM: Full    Dental  (+) Edentulous Upper, Edentulous Lower, Dental Advisory Given   Pulmonary neg shortness of breath, neg sleep apnea, neg COPD, neg recent URI, former smoker   breath sounds clear to auscultation       Cardiovascular hypertension, Pt. on medications + CAD and + Peripheral Vascular Disease  + dysrhythmias  Rhythm:Regular + Systolic murmurs 1. Left ventricular ejection fraction, by estimation, is 50 to 55%. Left  ventricular ejection fraction by 3D volume is 50 %. The left ventricle has  low normal function. The left ventricle has no regional wall motion  abnormalities. Left ventricular  diastolic parameters are consistent with Grade I diastolic dysfunction  (impaired relaxation).   2. Right ventricular systolic function is normal. The right ventricular  size is normal.   3. The mitral valve is degenerative. Trivial mitral valve regurgitation.  No evidence of mitral stenosis.   4. The aortic valve is tricuspid. Aortic valve regurgitation is mild.  Moderate to severe aortic valve stenosis. Aortic regurgitation PHT  measures 485 msec. Aortic valve mean gradient measures 25.0 mmHg. Aortic  valve Vmax measures 3.25 m/s. Although the  mean AVG and Vmax are c/w moderate AS, the DVI is low at 0.26 and SVI low  at 33. Findings are consistent with low flow low gradient moderate to  severe AS.   5. The inferior vena cava is normal in size with greater than 50%  respiratory variability, suggesting right atrial pressure of 3 mmHg.   6. Compared to echo dated 02/02/2022, the mean AVG is essentially   unchanged but DVI has decreased from 0.35 to 0.26 and AVA has decreased  from 1.2cm2 to 0.83cm2 (VTI).     Neuro/Psych RUE pain numbness in fingers from cervical radiculopathy   Neuromuscular disease  negative psych ROS   GI/Hepatic negative GI ROS, Neg liver ROS,,,  Endo/Other  diabetes    Renal/GU CRFRenal diseaseLab Results      Component                Value               Date                      NA                       137                 08/05/2023                K                        4.1                 08/05/2023                CO2                      23                  08/05/2023  GLUCOSE                  131 (H)             08/05/2023                BUN                      28 (H)              08/05/2023                CREATININE               1.54 (H)            08/05/2023                CALCIUM                   9.4                 08/05/2023                GFR                      43.86 (L)           12/20/2022                EGFR                     36 (L)              07/01/2023                GFRNONAA                 44 (L)              08/05/2023                Musculoskeletal  (+) Arthritis ,    Abdominal   Peds  Hematology  (+) Blood dyscrasia, anemia Lab Results      Component                Value               Date                      WBC                      11.3 (H)            08/05/2023                HGB                      12.8 (L)            08/05/2023                HCT                      38.5 (L)            08/05/2023                MCV  91.2                08/05/2023                PLT                      231                 08/05/2023              Anesthesia Other Findings   Reproductive/Obstetrics                              Anesthesia Physical Anesthesia Plan  ASA: 4  Anesthesia Plan: MAC   Post-op Pain Management:    Induction: Intravenous  PONV Risk  Score and Plan: 1 and Ondansetron  and TIVA  Airway Management Planned: Nasal Cannula, Natural Airway and Simple Face Mask  Additional Equipment:   Intra-op Plan:   Post-operative Plan:   Informed Consent: I have reviewed the patients History and Physical, chart, labs and discussed the procedure including the risks, benefits and alternatives for the proposed anesthesia with the patient or authorized representative who has indicated his/her understanding and acceptance.     Dental advisory given  Plan Discussed with: CRNA  Anesthesia Plan Comments: (Slave off procedural arterial line, R internal jugular pacer by CV)         Anesthesia Quick Evaluation

## 2023-08-09 NOTE — Discharge Summary (Incomplete)
 [facility]   MULTIDISCIPLINARY HEART VALVE TEAM  Discharge Summary    Patient ID: Jeff Burgess MRN: [id number]; DOB: 04/29/1935  Admit date: 08/09/2023 Discharge date: 08/10/2023  PCP:  Garald Karlynn GAILS, MD  [facility] Cardiologist:  [name], MD  [facility] Structural heart: [name], MD [facility] Electrophysiologist:  None   Discharge Diagnoses    Principal Problem:   S/P TAVR (transcatheter aortic valve replacement) Active Problems:   Dyslipidemia   Essential hypertension   CAD (coronary artery disease)   RBBB   Bladder cancer (HCC)   Diabetes mellitus, type 2 (HCC)   Severe aortic stenosis   Heart failure with mildly reduced ejection fraction (HFmrEF) (HCC)   CKD stage 3b, GFR 30-44 ml/min (HCC)   Allergies Allergies  Allergen Reactions   Coreg  [Carvedilol ] Other (See Comments)    Leg weakness, myopathy   Lipitor [Atorvastatin ] Other (See Comments)    Leg weakness, myopathy   Mevacor  [Lovastatin ] Other (See Comments)    Leg weakness, myopathy   Morphine Sulfate Other (See Comments)    Diaphoresis Per the pt: After taking morphine, within 30 mins, I was soaking wet. I did not like the way it made me feel.    Diagnostic Studies/Procedures    TAVR OPERATIVE NOTE     Date of Procedure:                08/09/2023   Preoperative Diagnosis:      Severe Aortic Stenosis    Postoperative Diagnosis:    Same    Procedure:        Transcatheter Aortic Valve Replacement - Percutaneous Transfemoral Approach             Edwards Sapien 3 Ultra Resilia THV (size 23 mm, serial # [id number])              Co-Surgeons:                        Dorise Fellers, MD and [name], MD   Anesthesiologist:                  Lonni Custard, MD   Echocardiographer:              Stanly Leavens, MD   Pre-operative Echo Findings: Severe aortic stenosis Normal left ventricular systolic function   Post-operative Echo  Findings: No paravalvular leak Normal/unchanged left ventricular systolic function _____________    Echo 08/10/23:  IMPRESSIONS   1. Left ventricular ejection fraction, by estimation, is 55 to 60%. Left  ventricular ejection fraction by 2D MOD biplane is 59.7 %. The left  ventricle has normal function. The left ventricle has no regional wall  motion abnormalities. Left ventricular  diastolic parameters were normal.   2. Right ventricular systolic function is normal. The right ventricular  size is normal.   3. The mitral valve is normal in structure. No evidence of mitral valve  regurgitation. No evidence of mitral stenosis.   4. Mildly elevated mean gradient, similar to yesterday. Normal  acceleration time and DVI, suspect normal valve function. . The aortic  valve has been repaired/replaced. Aortic valve regurgitation is not  visualized. No aortic stenosis is present. There is   a 23 mm Edwards Sapien prosthetic (TAVR) valve present in the aortic  position. Echo findings are consistent with normal structure and function  of the aortic valve prosthesis.   5. The inferior vena cava is normal  in size with greater than 50%  respiratory variability, suggesting right atrial pressure of 3 mmHg.   History of Present Illness     Jeff Burgess is a 88 y.o. male with a history of of CAD s/p 1V CABG in 1998 s/p DES to [facility] (06/17/23), carotid artery disease, PAD, HTN, HLD, CKD stage IIIb, DMT2, HFmrEF, bladder cancer s/p TURBT in 2017, chronic RBBB and severe AS who presented to [facility] on 08/09/23 for planned TAVR.   His most recent echocardiogram on 02/23/2023 showed EF 50-55%, LFLG severe AS with mean gradient 25 mmHg, AVA 0.83 cm2, DI 0.26, SVI 33. Cardiac catheterization 06/17/2023 showed chronic occlusion of the ostial LAD with a patent LIMA to the LAD filling the entire vessel. There was a large left circumflex with severe mid vessel stenosis successfully treated with PTCA and DES x 1. The RCA had  50% proximal stenosis and 70% PDA stenosis. He had some symptom improvement after PCI but continued to have significant exertional fatigue and shortness of breath even with walking short distances as well as lower extremity edema and orthopnea. He was started on diuretic therapy.   The patient was evaluated by the multidisciplinary valve team and felt to have severe, symptomatic aortic stenosis and to be a suitable candidate for TAVR, which was set up for 08/09/23.   Hospital Course     Consultants: none  Severe AS:  -- S/p successful TAVR with a 23 mm Edwards Sapien 3 Ultra Resilia THV via the TF approach on 08/09/23.  -- Post operative echo showed EF 60%, normally functioning TAVR with a mean gradient of 13 mmHg and no PVL. -- Groin sites are stable.  -- Continue DAPT with ASA and Plavix  with recent PCI.  -- Met with cardiac rehab to discuss CRP phase II.  -- Plan for discharge home today with close follow up in the outpatient setting.   Left elbow swelling: -- Developed ~102F fever, leukocytosis, elevated inflammatory markers and new elbow pain/swelling.  -- Concern for septic bursitis, Xray with soft tissue swelling over the olecranon. -- Blood cultures ordered and started on IV vanc.  -- Ortho consulted and joint aspiration c/w acute gout. -- Will treat with prednisone  40mg  x 5 days and stop Abx.   RBBB: -- ECG with old RBBB and new 1st deg AV block.  -- Will discharge with a Zio AT to rule out delayed HAVB.  CAD:  -- S/p remote CABG in 1998. -- Patient underwent cardiac catheterization on 06/16/2023 with chronic lesion of ostial LAD, patent LIMA to LAD, 90% M LCx treated with PCI/DES x 1.   -- Continue DAPT with ASA and Plavix  for at least 6 months.  -- Continue pitavsatain 4mg  daily.    PAD: -- Lower extremity Dopplers in 2019 showed total occlusion of the right posterior tibial artery and left anterior tibial artery.  -- ABI 1.12 on the right and 0.95 on the left.   -- Seen by  vascular surgery who felt symptoms related to pseudoclaudication from back issues.  -- Continue antiplatelets and high intensity statin.   HTN:  -- BP well controlled. . -- Pre TAVR CTs with moderate bilateral renal artery stenosis.  -- Resume home amlodipine  5 mg daily, Lasix  40 mg daily, Imdur  30 mg daily, and telmisartan -hydrochlorothiazide  80-25 mg daily.   HFmrEF: -- EF 50% -- LVEDP 18 mm hg.  -- Appears euvolemic.  -- Resume home Lasix .    HLD: -- Continue pitavastatin  4 mg daily.  Type 2 DM:  -- Last hemoglobin A1c 7.8 on 02/18/2023.   -- Treated with SSI while admitted.  -- Resume home meds at discharge. Okay to resume Metformin  after 48 hours after contrast dye exposure (8/1 AM)   CKD stage IIIb:  -- Creat baseline 1.46-1.79. -- Creat 1.51 today.   Abnormal CT chest: -- Pre TAVR CTs showed slightly more prominent and interval increase in size of a right peribronchovascular 1.4 x 1 cm (from 1.1 by 1.1 cm) soft tissue density. Recommend follow-up CT with intravenous contrast in 3 months to further evaluate stability. Additional imaging evaluation or consultation with Pulmonology or Thoracic Surgery recommended. Persistent but nonenlarged right hilar, left hilar, and mediastinal lymph nodes. Recommend attention on follow-up. -- This will be discussed in the outpatient setting.    _____________  Discharge Vitals Blood pressure 132/79, pulse 99, temperature 98.4 F (36.9 C), temperature source Oral, resp. rate 20, height 5' 9 (1.753 m), weight 91 kg, SpO2 92%.  Filed Weights   08/08/23 1400 08/09/23 0901 08/10/23 0300  Weight: 89.8 kg 89.4 kg 91 kg    Disposition   Pt is being discharged home today in good condition.  Follow-up Plans & Appointments     Follow-up Information     Sebastian Lamarr SAUNDERS, PA-C. Go on 08/18/2023.   Specialties: Cardiology, Radiology Why: @ 2:20pm, please arrive at least 15 minutes early. Contact information: [address] [place] [address] [phone]                Discharge Instructions     Amb Referral to Cardiac Rehabilitation   Complete by: As directed    To [facility]   Diagnosis:  Valve Replacement Coronary Stents PTCA     Valve: Aortic Comment - tavr   After initial evaluation and assessments completed: Virtual Based Care may be provided alone or in conjunction with Phase 2 Cardiac Rehab based on patient barriers.: Yes   Intensive Cardiac Rehabilitation (ICR) [facility] location only OR Traditional Cardiac Rehabilitation (TCR) *If criteria for ICR are not met will enroll in TCR [facility] only): Yes       Discharge Medications   Allergies as of 08/10/2023       Reactions   Coreg  [carvedilol ] Other (See Comments)   Leg weakness, myopathy   Lipitor [atorvastatin ] Other (See Comments)   Leg weakness, myopathy   Mevacor  [lovastatin ] Other (See Comments)   Leg weakness, myopathy   Morphine Sulfate Other (See Comments)   Diaphoresis Per the pt: After taking morphine, within 30 mins, I was soaking wet. I did not like the way it made me feel.        Medication List     PAUSE taking these medications    metFORMIN  500 MG 24 hr tablet Wait to take this until: August 12, 2023 Morning Commonly known as: GLUCOPHAGE -XR TAKE 1 TABLET BY MOUTH TWICE  DAILY       TAKE these medications    amLODipine  5 MG tablet Commonly known as: NORVASC  TAKE 1 TABLET BY MOUTH DAILY   aspirin  81 MG tablet Take 2 tablets (162 mg total) by mouth daily. What changed: how much to take   b complex vitamins capsule Take 1 capsule by mouth daily.   cholecalciferol  25 MCG (1000 UNIT) tablet Commonly known as: VITAMIN D3 Take 1 tablet (1,000 Units total) by mouth every morning. Follow-up due in April must see provider for refills   CINNAMON PO Take 3 capsules by mouth daily. OTC  cinnamon 1000mg  capsules.   clopidogrel  75 MG tablet Commonly known as: PLAVIX  Take 1 tablet (75 mg total) by  mouth daily with breakfast.   furosemide  40 MG tablet Commonly known as: LASIX  Take 1 tablet (40 mg total) by mouth daily.   gabapentin  300 MG capsule Commonly known as: NEURONTIN  Take 300-600 mg by mouth daily as needed (nerve pain).   HYDROcodone -acetaminophen  5-325 MG tablet Commonly known as: NORCO/VICODIN Take 1 tablet by mouth every 6 (six) hours as needed (pain).   isosorbide  mononitrate 30 MG 24 hr tablet Commonly known as: IMDUR  TAKE 1 TABLET BY MOUTH DAILY   Mens 50+ Multivitamin Tabs Take 1 tablet by mouth daily.   nitroGLYCERIN  0.4 MG SL tablet Commonly known as: NITROSTAT  Place 1 tablet (0.4 mg total) under the tongue every 5 (five) minutes as needed.   predniSONE  20 MG tablet Commonly known as: DELTASONE  Take 2 tablets (40 mg total) by mouth daily.   repaglinide  1 MG tablet Commonly known as: PRANDIN  TAKE 1 TABLET (1 MG TOTAL) BY MOUTH 3 (THREE) TIMES DAILY BEFORE MEALS.   telmisartan -hydrochlorothiazide  80-25 MG tablet Commonly known as: MICARDIS  HCT TAKE 1 TABLET BY MOUTH DAILY   TYLENOL  500 MG tablet Generic drug: acetaminophen  Take 500-1,000 mg by mouth 2 (two) times daily as needed for moderate pain (pain score 4-6), fever or headache.   Zypitamag  4 MG Tabs Generic drug: Pitavastatin  Magnesium  TAKE 1 TABLET BY MOUTH EVERY DAY         Outstanding Labs/Studies   BMET  ______________________  Duration of Discharge Encounter: APP Time: 25 minutes    Signed, Lamarr Hummer, PA-C 08/10/2023, 2:36 PM [phone]  Patient seen, examined. Available data reviewed. Agree with findings, assessment, and plan as outlined by Izetta Hummer, PA-C.  Patient independently interviewed and examined.  On exam, he is alert, oriented, in no distress.  HEENT is normal, JVP is normal, lungs are clear bilaterally, heart is regular rate and rhythm with a 2/6 ejection murmur at the right upper sternal border, no diastolic murmur, abdomen is soft and nontender,  extremities have no edema.  There is no hematoma or ecchymosis at the access sites.  The left elbow is swollen and tender over the olecranon.  There is some redness as well.  Patient reports no history of gout.  From a cardiac perspective he has done very well his heart rhythm is stable.  His echo shows normal LV function with LVEF 55 to 60%, normal RV function, normal function of the TAVR prosthesis with a mean gradient of 13 mmHg and dimensionless index of 0.5.  The aortic valve area calculation by echo is inaccurate because the LVOT is measured at 1.4 cm.  Orthopedic consultation was obtained and the patient's left elbow was aspirated, confirming gout.  He will be treated with a prednisone  taper.  Will treat him with aspirin  post TAVR.  Follow-up will be arranged as above.  MD time conducting today's discharge is 30 minutes and includes review of his echo, personal exam and counseling of the patient, orthopedic consultation and review of their recommendations, and discharge instructions with the patient.  [name], M.D. 08/12/2023 9:34 AM

## 2023-08-09 NOTE — Op Note (Signed)
 [facility]   MULTIDISCIPLINARY HEART VALVE TEAM   TAVR OPERATIVE NOTE   Date of Procedure:  08/09/2023  Preoperative Diagnosis: Severe Aortic Stenosis   Postoperative Diagnosis: Same   Procedure:   Transcatheter Aortic Valve Replacement - Percutaneous left Transfemoral Approach  Edwards Sapien 3 Ultra Resilia THV (size 23 mm, model # 9755RSL, serial # [id number])   Co-Surgeons:  Dorise LOIS Fellers, MD and [name], MD    Anesthesiologist:  C. Leopoldo, MD  Echocardiographer:  EMERSON Leavens, MD  Pre-operative Echo Findings: Severe aortic stenosis Normal left ventricular systolic function  Post-operative Echo Findings: No paravalvular leak Normal left ventricular systolic function   BRIEF CLINICAL NOTE AND INDICATIONS FOR SURGERY  This 88 year old gentleman has stage D2, severe, symptomatic, paradoxical low-flow/low gradient aortic stenosis with NYHA class III symptoms of exertional fatigue and shortness of breath consistent with chronic diastolic congestive heart failure.  I have personally reviewed his 2D echocardiogram, cardiac catheterization, and CTA studies.  His echo in February 2025 shows a severely calcified and thickened aortic valve with a mean gradient of 25 mmHg but an aortic valve area of 0.83 cm.  Cardiac catheterization showed a patent LIMA to the LAD and a high-grade mid left circumflex stenosis successfully treated with PCI.  I agree that aortic valve replacement is indicated in this patient for relief of his symptoms and to prevent progressive left ventricular dysfunction.  Given his advanced age and prior coronary bypass surgery I think transcatheter aortic valve replacement be the best option for treating him.  His gated cardiac CTA shows anatomy suitable for TAVR using a 23 mm SAPIEN 3 valve.  His abdominal and pelvic CTA shows adequate pelvic vascular anatomy to allow transfemoral insertion.  His electrocardiogram shows sinus rhythm with  right bundle branch block and therefore he is at increased risk for complete heart block.  We would plan to use right IJ pacing.   The patient and his family were counseled at length regarding treatment alternatives for management of severe symptomatic aortic stenosis. The risks and benefits of surgical intervention has been discussed in detail. Long-term prognosis with medical therapy was discussed. Alternative approaches such as conventional surgical aortic valve replacement, transcatheter aortic valve replacement, and palliative medical therapy were compared and contrasted at length. This discussion was placed in the context of the patient's own specific clinical presentation and past medical history. All of their questions have been addressed.    Following the decision to proceed with transcatheter aortic valve replacement, a discussion was held regarding what types of management strategies would be attempted intraoperatively in the event of life-threatening complications, including whether or not the patient would be considered a candidate for the use of cardiopulmonary bypass and/or conversion to open sternotomy for attempted surgical intervention.  Given his advanced age and prior coronary bypass surgery I do not think he is a candidate for emergent sternotomy to manage any intraoperative complications.  The patient has been advised of a variety of complications that might develop including but not limited to risks of death, stroke, paravalvular leak, aortic dissection or other major vascular complications, aortic annulus rupture, device embolization, cardiac rupture or perforation, mitral regurgitation, acute myocardial infarction, arrhythmia, heart block or bradycardia requiring permanent pacemaker placement, congestive heart failure, respiratory failure, renal failure, pneumonia, infection, other late complications related to structural valve deterioration or migration, or other complications that  might ultimately cause a temporary or permanent loss of functional independence or other long term morbidity. The patient provides  full informed consent for the procedure as described and all questions were answered.       DETAILS OF THE OPERATIVE PROCEDURE  PREPARATION:    The patient was brought to the operating room on the above mentioned date and appropriate monitoring was established by the anesthesia team. The patient was placed in the supine position on the operating table.  Intravenous antibiotics were administered. The patient was monitored closely throughout the procedure under conscious sedation.   Baseline transthoracic echocardiogram was performed. The patient's abdomen and both groins were prepped and draped in a sterile manner. A time out procedure was performed.   PERIPHERAL ACCESS:    Using the modified Seldinger technique, femoral arterial access was obtained with placement of a 6 Fr sheath on the right side.  A pigtail diagnostic catheter was passed through the right arterial sheath under fluoroscopic guidance into the aortic root.  A 6 Fr sheath was placed in the right internal jugular vein. A temporary transvenous pacemaker catheter was passed through the right internal jugular venous sheath under fluoroscopic guidance into the right ventricle.  The pacemaker was tested to ensure stable lead placement and pacemaker capture. Aortic root angiography was performed in order to determine the optimal angiographic angle for valve deployment.   TRANSFEMORAL ACCESS:   Percutaneous transfemoral access and sheath placement was performed using ultrasound guidance.  The left common femoral artery was cannulated using a micropuncture needle and appropriate location was verified using hand injection angiogram.  A pair of Abbott Perclose percutaneous closure devices were placed and a 6 French sheath replaced into the femoral artery.  The patient was heparinized systemically and ACT  verified > 250 seconds.    A 14 Fr transfemoral E-sheath was introduced into the left common femoral artery after progressively dilating over an Amplatz superstiff wire. An AL-2 catheter was used to direct a straight-tip exchange length wire across the native aortic valve into the left ventricle. This was exchanged out for a pigtail catheter and position was confirmed in the LV apex. Simultaneous LV and Ao pressures were recorded.  The pigtail catheter was exchanged for a Safari wire in the LV apex.   BALLOON AORTIC VALVULOPLASTY:   Not performed   TRANSCATHETER HEART VALVE DEPLOYMENT:   An Edwards Sapien 3 Ultra transcatheter heart valve (size 23 mm) was prepared and crimped per manufacturer's guidelines, and the proper orientation of the valve is confirmed on the [employer] delivery system. The valve was advanced through the introducer sheath using normal technique until in an appropriate position in the abdominal aorta beyond the sheath tip. The balloon was then retracted and using the fine-tuning wheel was centered on the valve. The valve was then advanced across the aortic arch using appropriate flexion of the catheter. The valve was carefully positioned across the aortic valve annulus. The Commander catheter was retracted using normal technique. Once final position of the valve has been confirmed by angiographic assessment, the valve is deployed during rapid ventricular pacing to maintain systolic blood pressure < 50 mmHg and pulse pressure < 10 mmHg. The balloon inflation is held for >3 seconds after reaching full deployment volume. Once the balloon has fully deflated the balloon is retracted into the ascending aorta and valve function is assessed using echocardiography. There is felt to be no paravalvular leak and no central aortic insufficiency.  The patient's hemodynamic recovery following valve deployment is good.  The deployment balloon and guidewire are both removed.    PROCEDURE  COMPLETION:  The sheath was removed and femoral artery closure performed.  Protamine  was administered once femoral arterial repair was complete. The temporary pacemaker, pigtail catheter and femoral sheaths were removed with manual pressure used for venous hemostasis.  A Mynx femoral closure device was utilized following removal of the diagnostic sheath in the right femoral artery.  The patient tolerated the procedure well and is transported to the cath lab recovery area in stable condition. There were no immediate intraoperative complications. All sponge instrument and needle counts are verified correct at completion of the operation.   No blood products were administered during the operation.  The patient received a total of 40 mL of intravenous contrast during the procedure.   Dorise MARLA Fellers, MD 08/09/2023

## 2023-08-10 ENCOUNTER — Inpatient Hospital Stay (HOSPITAL_COMMUNITY)

## 2023-08-10 ENCOUNTER — Encounter (HOSPITAL_COMMUNITY): Payer: Self-pay | Admitting: Cardiovascular Disease

## 2023-08-10 ENCOUNTER — Inpatient Hospital Stay (HOSPITAL_COMMUNITY)
Admit: 2023-08-10 | Discharge: 2023-08-10 | Disposition: A | Discharge status: Home / Self Care | Attending: Physician Assistant

## 2023-08-10 ENCOUNTER — Other Ambulatory Visit (HOSPITAL_COMMUNITY): Payer: Self-pay

## 2023-08-10 DIAGNOSIS — Z952 Presence of prosthetic heart valve: Secondary | ICD-10-CM

## 2023-08-10 DIAGNOSIS — I4510 Unspecified right bundle-branch block: Secondary | ICD-10-CM

## 2023-08-10 LAB — BASIC METABOLIC PANEL WITH GFR
Anion gap: 8 (ref 5–15)
BUN: 28 mg/dL — ABNORMAL HIGH (ref 8–23)
CO2: 21 mmol/L — ABNORMAL LOW (ref 22–32)
Calcium: 9.1 mg/dL (ref 8.9–10.3)
Chloride: 106 mmol/L (ref 98–111)
Creatinine, Ser: 1.51 mg/dL — ABNORMAL HIGH (ref 0.61–1.24)
GFR, Estimated: 45 mL/min — ABNORMAL LOW (ref >60)
Glucose, Bld: 136 mg/dL — ABNORMAL HIGH (ref 70–99)
Potassium: 4.4 mmol/L (ref 3.5–5.1)
Sodium: 135 mmol/L (ref 135–145)

## 2023-08-10 LAB — CBC
HCT: 35.7 % — ABNORMAL LOW (ref 39.0–52.0)
Hemoglobin: 12.1 g/dL — ABNORMAL LOW (ref 13.0–17.0)
MCH: 30 pg (ref 26.0–34.0)
MCHC: 33.9 g/dL (ref 30.0–36.0)
MCV: 88.6 fL (ref 80.0–100.0)
Platelets: 209 K/uL (ref 150–400)
RBC: 4.03 MIL/uL — ABNORMAL LOW (ref 4.22–5.81)
RDW: 12.6 % (ref 11.5–15.5)
WBC: 14.3 K/uL — ABNORMAL HIGH (ref 4.0–10.5)
nRBC: 0 % (ref 0.0–0.2)

## 2023-08-10 LAB — ECHOCARDIOGRAM COMPLETE
AR max vel: 0.92 cm2
AV Area VTI: 0.79 cm2
AV Area mean vel: 0.81 cm2
AV Mean grad: 13 mmHg
AV Peak grad: 20.3 mmHg
Ao pk vel: 2.25 m/s
Area-P 1/2: 6.07 cm2
Calc EF: 59.7 %
Height: 69 in
S' Lateral: 3.5 cm
Single Plane A2C EF: 68.3 %
Single Plane A4C EF: 48.3 %
Weight: 3209.6 [oz_av]

## 2023-08-10 LAB — GLUCOSE, CAPILLARY
Glucose-Capillary: 133 mg/dL — ABNORMAL HIGH (ref 70–99)
Glucose-Capillary: 203 mg/dL — ABNORMAL HIGH (ref 70–99)

## 2023-08-10 LAB — SYNOVIAL CELL COUNT + DIFF, W/ CRYSTALS
Eosinophils-Synovial: 0 % (ref 0–1)
Lymphocytes-Synovial Fld: 1 % (ref 0–20)
Monocyte-Macrophage-Synovial Fluid: 1 % — ABNORMAL LOW (ref 50–90)
Neutrophil, Synovial: 98 % — ABNORMAL HIGH (ref 0–25)
WBC, Synovial: 3875 /mm3 — ABNORMAL HIGH (ref 0–200)

## 2023-08-10 LAB — SEDIMENTATION RATE: Sed Rate: 27 mm/h — ABNORMAL HIGH (ref 0–16)

## 2023-08-10 LAB — C-REACTIVE PROTEIN: CRP: 1 mg/dL — ABNORMAL HIGH (ref <1.0)

## 2023-08-10 LAB — MAGNESIUM: Magnesium: 1.6 mg/dL — ABNORMAL LOW (ref 1.7–2.4)

## 2023-08-10 MED ORDER — PREDNISONE 20 MG PO TABS
40.0000 mg | ORAL_TABLET | Freq: Every day | ORAL | 0 refills | Status: DC
  Filled 2023-08-10: qty 10, 5d supply, fill #0

## 2023-08-10 MED ORDER — ISOSORBIDE MONONITRATE ER 30 MG PO TB24
30.0000 mg | ORAL_TABLET | Freq: Every day | ORAL | Status: DC
  Administered 2023-08-10: 30 mg via ORAL
  Filled 2023-08-10: qty 1

## 2023-08-10 MED ORDER — VANCOMYCIN HCL 1250 MG/250ML IV SOLN: 1250.0000 mg | INTRAVENOUS | Status: DC

## 2023-08-10 MED ORDER — MAGNESIUM SULFATE 2 GM/50ML IV SOLN
2.0000 g | Freq: Once | INTRAVENOUS | Status: AC
  Administered 2023-08-10: 2 g via INTRAVENOUS
  Filled 2023-08-10: qty 50

## 2023-08-10 MED ORDER — PRAVASTATIN SODIUM 40 MG PO TABS
40.0000 mg | ORAL_TABLET | Freq: Every day | ORAL | Status: DC
  Administered 2023-08-10: 40 mg via ORAL
  Filled 2023-08-10: qty 1

## 2023-08-10 MED ORDER — AMLODIPINE BESYLATE 5 MG PO TABS
5.0000 mg | ORAL_TABLET | Freq: Every day | ORAL | Status: DC
  Administered 2023-08-10: 5 mg via ORAL
  Filled 2023-08-10: qty 1

## 2023-08-10 MED ORDER — VANCOMYCIN HCL 1500 MG/300ML IV SOLN
1500.0000 mg | Freq: Once | INTRAVENOUS | Status: AC
  Administered 2023-08-10: 1500 mg via INTRAVENOUS
  Filled 2023-08-10: qty 300

## 2023-08-10 MED ORDER — PERFLUTREN LIPID MICROSPHERE
1.0000 mL | INTRAVENOUS | Status: AC | PRN
  Administered 2023-08-10: 2 mL via INTRAVENOUS

## 2023-08-10 MED ORDER — PREDNISONE 20 MG PO TABS
40.0000 mg | ORAL_TABLET | Freq: Once | ORAL | Status: AC
  Administered 2023-08-10: 40 mg via ORAL
  Filled 2023-08-10: qty 2

## 2023-08-10 MED ORDER — FUROSEMIDE 40 MG PO TABS
40.0000 mg | ORAL_TABLET | Freq: Every day | ORAL | Status: DC
  Administered 2023-08-10: 40 mg via ORAL
  Filled 2023-08-10: qty 1

## 2023-08-10 MED ORDER — VANCOMYCIN HCL IN DEXTROSE 1-5 GM/200ML-% IV SOLN: 1000.0000 mg | INTRAVENOUS | Status: DC

## 2023-08-10 NOTE — TOC Transition Note (Signed)
 Transition of Care [facility]) - Discharge Note   Patient Details  Name: Jeff Burgess MRN: [id number] Date of Birth: Nov 03, 1935  Transition of Care [facility]) CM/SW Contact:  Andrez JULIANNA George, RN Phone Number: 08/10/2023, 3:09 PM   Clinical Narrative:     Pt is discharging home  with self care. No needs per IP Care Management.   Final next level of care: Home/Self Care Barriers to Discharge: No Barriers Identified   Patient Goals and CMS Choice            Discharge Placement                       Discharge Plan and Services Additional resources added to the After Visit Summary for                                       Social Drivers of Health (SDOH) Interventions SDOH Screenings   Food Insecurity: No Food Insecurity (08/10/2023)  Housing: Low Risk  (08/10/2023)  Transportation Needs: No Transportation Needs (08/10/2023)  Utilities: Not At Risk (08/10/2023)  Alcohol Screen: Low Risk  (05/24/2023)  Depression (PHQ2-9): Low Risk  (05/26/2023)  Financial Resource Strain: Low Risk  (05/30/2023)   Received from [facility]  Physical Activity: Inactive (05/24/2023)  Social Connections: Unknown (08/10/2023)  Recent Concern: Social Connections - Moderately Isolated (05/24/2023)  Stress: No Stress Concern Present (05/24/2023)  Tobacco Use: Medium Risk (08/03/2023)  Health Literacy: Adequate Health Literacy (05/24/2023)     Readmission Risk Interventions     No data to display

## 2023-08-10 NOTE — Progress Notes (Signed)
 Temperature after Tylenol  given is now 98.7 F. Patient states he feels a lot better now and is no longer flushed in the face.  Will continue to monitor.  Kristene Sotero BRAVO, RN

## 2023-08-10 NOTE — Progress Notes (Signed)
 Discussed with pt and wife restrictions, walking for exercise, and CRPII. Pt receptive. Will refer again to [facility] now that TAVR is done. Encouraged ambulation today with staff/MT.  [id number] Aliene Aris BS, ACSM-CEP 08/10/2023 9:41 AM

## 2023-08-10 NOTE — Progress Notes (Signed)
 Around 02:40, the patient's heart rate shot up to the 130s. Nurse went in to check on him and the patient stated his left elbow was throbbing all night and it felt worse. Said he also felt very warm, like he had a fever. The patient was flushed and hot to touch. His left elbow appeared like he had an egg under his skin, which was not like that before.  When asked if the patient had a history of gout, he said no.  Vital signs were taken: Temp 101.7 F, BP 165/84, HR 111, Resp 20, and O2 at 94% on room air.  Gave Tylenol  650 mg and Oxycodone  10 mg for fever and pain, respectively. Paged the on call cardiologist, who came to assess the patient. He ordered new labs, including blood cultures, this am and a STAT x-ray of his arm. Will continue to monitor.   Kristene Sotero BRAVO, RN

## 2023-08-10 NOTE — Progress Notes (Addendum)
 HEART AND VASCULAR CENTER   MULTIDISCIPLINARY HEART VALVE TEAM  Patient Name: Jeff Burgess Date of Encounter: 08/10/2023  Admit date: 08/09/2023  Primary Care Provider: Garald Karlynn GAILS, MD [facility] Cardiologist: [name], MD  Special[facility] Electrophysiologist:  None   Hospital Problem List     Principal Problem:   S/P TAVR (transcatheter aortic valve replacement) Active Problems:   Dyslipidemia   Essential hypertension   CAD (coronary artery disease)   RBBB   Bladder cancer (HCC)   Diabetes mellitus, type 2 (HCC)   Severe aortic stenosis   Heart failure with mildly reduced ejection fraction (HFmrEF) (HCC)   CKD stage 3b, GFR 30-44 ml/min (HCC)   Subjective   Couldn't sleep last night due to swelling in elbow. Otherwise feels pretty good with no complaints.   Inpatient Medications    Scheduled Meds:  amLODipine   5 mg Oral Daily   aspirin  EC  81 mg Oral Daily   clopidogrel   75 mg Oral Q breakfast   furosemide   40 mg Oral Daily   insulin  aspart  0-24 Units Subcutaneous TID AC & HS   isosorbide  mononitrate  30 mg Oral Daily   sodium chloride  flush  3 mL Intravenous Q12H   Continuous Infusions:  sodium chloride      norepinephrine  (LEVOPHED ) Adult infusion     [START ON 08/12/2023] vancomycin      PRN Meds: sodium chloride , acetaminophen  **OR** acetaminophen , ondansetron  (ZOFRAN ) IV, oxyCODONE , sodium chloride  flush, traMADol    Vital Signs    Vitals:   08/10/23 0244 08/10/23 0300 08/10/23 0418 08/10/23 0810  BP: (!) 165/84   (!) 140/58  Pulse: (!) 111 (!) 110 (!) 111 99  Resp: 20 17 20 20   Temp: (!) 101.7 F (38.7 C)  98.7 F (37.1 C) 99.4 F (37.4 C)  TempSrc: Oral  Oral Oral  SpO2: 94% 95% 90% 90%  Weight:  91 kg    Height:        Intake/Output Summary (Last 24 hours) at 08/10/2023 0953 Last data filed at 08/10/2023 0648 Gross per 24 hour  Intake 1926.05 ml  Output 825 ml  Net 1101.05 ml   Filed Weights   08/08/23 1400  08/09/23 0901 08/10/23 0300  Weight: 89.8 kg 89.4 kg 91 kg    Physical Exam    GEN: Well nourished, well developed, in no acute distress.  HEENT: Grossly normal.  Neck: Supple, no JVD or masses. Cardiac: RRR, 2/6 flow murmur @ RUSB. No rubs, or gallops. No clubbing, cyanosis, edema.   Respiratory:  Respirations regular and unlabored, clear to auscultation bilaterally. GI: Soft, nontender, nondistended, BS + x 4. MS: no deformity or atrophy. Localized area of swelling and tenderness at the dorsal aspect of the elbow with warmth and tenderness Skin: warm and dry, no rash.  Groin sites clear without hematoma or ecchymosis. Neuro:  Strength and sensation are intact. Psych: AAOx3.  Normal affect.  Labs    CBC Recent Labs    08/09/23 1454 08/10/23 0332  WBC  --  14.3*  HGB 8.2* 12.1*  HCT 24.0* 35.7*  MCV  --  88.6  PLT  --  209   Basic Metabolic Panel Recent Labs    92/70/74 1454 08/10/23 0324 08/10/23 0332  NA 144  --  135  K 3.2*  --  4.4  CL 108  --  106  CO2  --   --  21*  GLUCOSE 143*  --  136*  BUN 22  --  28*  CREATININE 1.20  --  1.51*  CALCIUM   --   --  9.1  MG  --  1.6*  --     Telemetry    Sinus with PACs and PVCs - Personally Reviewed  ECG    Sinus with RBBB and 1st deg AV block, HR 102. - Personally Reviewed  Patient Profile     Jeff Burgess is a 88 y.o. male with a history of of CAD s/p 1V CABG in 1998 s/p DES to [facility] (06/17/23), carotid artery disease, PAD, HTN, HLD, CKD stage IIIb, DMT2, HFmrEF, bladder cancer s/p TURBT in 2017, chronic RBBB and severe AS who presented to [facility] on 08/09/23 for planned TAVR.   Assessment & Plan    Severe AS:  -- S/p successful TAVR with a 23 mm Edwards Sapien 3 Ultra Resilia THV via the TF approach on 08/09/23.  -- Post operative echo completed but pending formal read. -- Groin sites are stable.  -- ECG with old RBBB and new 1st deg AV block.  -- Continue DAPT with ASA and Plavix  with recent PCI. -- Keep  overnight tonight given fever, leukocytosis, elevated inflammatory markers and new elbow pain/swelling.   Left elbow swelling: -- Developed ~102F fever, leukocytosis, elevated inflammatory markers and new elbow pain/swelling.  -- Concern for septic bursitis, Xray with soft tissue swelling over the olecranon. -- Blood cultures ordered and started on IV vanc.  -- Ortho consulted for consideration of aspiration and recommendations on antibiotic regimen.    CAD:  -- S/p remote CABG in 1998. -- Patient underwent cardiac catheterization on 06/16/2023 with chronic lesion of ostial LAD, patent LIMA to LAD, 90% M LCx treated with PCI/DES x 1.   -- Continue DAPT with ASA and Plavix  for at least 6 months.  -- Continue pitavsatain 4mg  daily.    PAD: -- Lower extremity Dopplers in 2019 showed total occlusion of the right posterior tibial artery and left anterior tibial artery.  -- ABI 1.12 on the right and 0.95 on the left.   -- He was referred to vascular surgery who felt symptoms related to pseudoclaudication from back issues.  -- Continue aspirin  and high intensity statin.   HTN:  -- BP elevated in the setting of holding home meds. -- Pre TAVR CTs with moderate bilateral renal artery stenosis.  -- Resume home amlodipine  5 mg daily, Imdur  30 mg daily and Lasix  40mg  daily.    HFmrEF: -- EF 50% -- LVEDP 18 mm hg.  -- Appears euvolemic.  -- Resume home Lasix .    HLD: -- Continue pitavastatin  4 mg daily.    Type 2 DM:  -- Last hemoglobin A1c 7.8 on 02/18/2023.   -- Continue SSI.   CKD stage IIIb:  -- Creat baseline 1.46-1.79. -- Creat 1.51 today.     Jeff Lamarr Hummer, PA-C  08/10/2023, 9:53 AM  Pager [phone]  Patient seen, examined. Available data reviewed. Agree with findings, assessment, and plan as outlined by Izetta Hummer, PA-C. On my exam this morning:  Vitals:   08/10/23 1048 08/10/23 1212  BP: 123/61 132/79  Pulse:  99  Resp:  20  Temp:  98.4 F (36.9 C)  SpO2:   92%   Pt is alert and oriented, NAD HEENT: normal Neck: JVP - normal Lungs: CTA bilaterally CV: RRR with 2/6 systolic murmur at the RUSB Abd: soft, NT, Positive BS, no hepatomegaly Ext: no C/C/E, distal pulses intact and equal. Groin site clear with no hematoma or ecchymosis.  Right elbow swelling over the olecranon and warmth  Pt stable, but concerned about his elbow swelling and possible olecranon bursitis versus infectious process. Will consult orthopedics. Pt empirically started on vancomycin . Check 2D echo today for POD #1 study. Likely keep in hospital another 24 hours for ortho consult and further monitoring. Otherwise, as outlined above.     [name], M.D. 08/10/2023 1:22 PM

## 2023-08-10 NOTE — Consult Note (Addendum)
 Reason for Consult:Left elbow swelling Referring Physician: [name] Time called: 9166 Time at bedside: 9063   Jeff Burgess is an 88 y.o. male.  HPI: Jeff Burgess underwent scheduled TAVR yesterday. Overnight he developed fevers and this morning had left elbow pain and swelling and orthopedic surgery was consulted. He's had prior e/o but only after trauma. Denies hx/o gout. He is RHD.  Past Medical History:  Diagnosis Date   Benign localized prostatic hyperplasia with lower urinary tract symptoms (LUTS)    Bladder cancer [facility]) dx 05/ 2017--  urologist- dr chauncey   TCC , Ta of bladder  s/p TURBT 06-02-2015   CAD (coronary artery disease)    no cardiologist--  followed by pcp,  dr plontnikov   Chronic low back pain with sciatica    neuropathy feet   CKD stage 3b, GFR 30-44 ml/min (HCC)    Diabetes mellitus, type 2 (HCC)    ED (erectile dysfunction)    Full dentures    Heart failure with mildly reduced ejection fraction (HFmrEF) (HCC)    History of kidney stones    Hyperlipidemia    Hypertension    Hypogonadism male    OA (osteoarthritis)    PAD (peripheral artery disease) (HCC)    Right bundle branch block (RBBB)    S/P CABG x 1    1998   S/P TAVR (transcatheter aortic valve replacement) 08/09/2023   s/p TAVR with a 23 mm Edwards Sapien 3 Ultra Resilia THV via the TF approach by Dr. Fell and Dr. Lucas   Severe aortic stenosis    Sigmoid diverticulosis    Ventral hernia    midline   Wears glasses     Past Surgical History:  Procedure Laterality Date   COLONOSCOPY WITH PROPOFOL   02-01-2012   CORONARY ARTERY BYPASS GRAFT  1998  at [facility]   x1  vessel    CORONARY STENT INTERVENTION N/A 06/17/2023   Procedure: CORONARY STENT INTERVENTION;  Surgeon: Verlin Lonni BIRCH, MD;  Location: [facility] INVASIVE CV LAB;  Service: Cardiovascular;  Laterality: N/A;   CORONARY/GRAFT ANGIOGRAPHY N/A 06/17/2023   Procedure: CORONARY/GRAFT ANGIOGRAPHY;  Surgeon: Verlin Lonni BIRCH, MD;  Location: [facility] INVASIVE CV LAB;  Service: Cardiovascular;  Laterality: N/A;   CYSTOSCOPY WITH BIOPSY N/A 10/08/2015   Procedure: CYSTOSCOPY WITH BLADDER BIOPSY;  Surgeon: Redell Lynwood chauncey, MD;  Location: [facility];  Service: Urology;  Laterality: N/A;   CYSTOSCOPY WITH FULGERATION N/A 10/08/2015   Procedure: CYSTOSCOPY WITH FULGERATION;  Surgeon: Redell Lynwood chauncey, MD;  Location: [facility];  Service: Urology;  Laterality: N/A;   CYSTOSCOPY WITH FULGERATION N/A 01/14/2022   Procedure: CYSTOSCOPY WITH FULGERATION;  Surgeon: Carolee Sherwood BIRCH DOUGLAS, MD;  Location: [facility];  Service: Urology;  Laterality: N/A;   INTRAOPERATIVE TRANSTHORACIC ECHOCARDIOGRAM N/A 08/09/2023   Procedure: ECHOCARDIOGRAM, TRANSTHORACIC;  Surgeon: Fell Ozell, MD;  Location: [facility] INVASIVE CV LAB;  Service: Cardiovascular;  Laterality: N/A;   TONSILLECTOMY  age 13   TRANSURETHRAL RESECTION OF BLADDER TUMOR Bilateral 05/28/2022   Procedure: TRANSURETHRAL RESECTION OF BLADDER TUMOR (TURBT)  BILATERAL RETROGRADE PYELOGRAM;  Surgeon: Carolee Sherwood BIRCH DOUGLAS, MD;  Location: [facility];  Service: Urology;  Laterality: Bilateral;  1 HR FOR CASE   TRANSURETHRAL RESECTION OF BLADDER TUMOR WITH GYRUS (TURBT-GYRUS) N/A 06/02/2015   Procedure: TRANSURETHRAL RESECTION OF BLADDER TUMOR WITH GYRUS (TURBT-GYRUS);  Surgeon: Redell Lynwood chauncey, MD;  Location: [facility];  Service: Urology;  Laterality: N/A;  Family History  Problem Relation Age of Onset   Arthritis Mother 20       RA   Alcohol abuse Father 60       exposure   Diabetes Brother    Kidney disease Brother    Heart disease Brother    Hypertension Other    Colon cancer Neg Hx    Esophageal cancer Neg Hx    Rectal cancer Neg Hx    Stomach cancer Neg Hx     Social History:  reports that he quit smoking about 52 years ago. His smoking use included cigarettes. He started smoking about 67 years ago. He has been exposed to  tobacco smoke. He has quit using smokeless tobacco.  His smokeless tobacco use included snuff. He reports that he does not drink alcohol and does not use drugs.  Allergies:  Allergies  Allergen Reactions   Coreg  [Carvedilol ] Other (See Comments)    Leg weakness, myopathy   Lipitor [Atorvastatin ] Other (See Comments)    Leg weakness, myopathy   Mevacor  [Lovastatin ] Other (See Comments)    Leg weakness, myopathy   Morphine Sulfate Other (See Comments)    Diaphoresis Per the pt: After taking morphine, within 30 mins, I was soaking wet. I did not like the way it made me feel.    Medications: I have reviewed the patient's current medications.  Results for orders placed or performed during the hospital encounter of 08/09/23 (from the past 48 hours)  Glucose, capillary     Status: Abnormal   Collection Time: 08/09/23  9:07 AM  Result Value Ref Range   Glucose-Capillary 166 (H) 70 - 99 mg/dL    Comment: Glucose reference range applies only to samples taken after fasting for at least 8 hours.  Glucose, capillary     Status: Abnormal   Collection Time: 08/09/23 11:13 AM  Result Value Ref Range   Glucose-Capillary 132 (H) 70 - 99 mg/dL    Comment: Glucose reference range applies only to samples taken after fasting for at least 8 hours.  Glucose, capillary     Status: Abnormal   Collection Time: 08/09/23  1:06 PM  Result Value Ref Range   Glucose-Capillary 125 (H) 70 - 99 mg/dL    Comment: Glucose reference range applies only to samples taken after fasting for at least 8 hours.  POCT Activated clotting time     Status: None   Collection Time: 08/09/23  2:41 PM  Result Value Ref Range   Activated Clotting Time 250 seconds    Comment: Reference range 74-137 seconds for patients not on anticoagulant therapy.  I-STAT, chem 8     Status: Abnormal   Collection Time: 08/09/23  2:54 PM  Result Value Ref Range   Sodium 144 135 - 145 mmol/L   Potassium 3.2 (L) 3.5 - 5.1 mmol/L   Chloride 108  98 - 111 mmol/L   BUN 22 8 - 23 mg/dL   Creatinine, Ser 8.79 0.61 - 1.24 mg/dL   Glucose, Bld 856 (H) 70 - 99 mg/dL    Comment: Glucose reference range applies only to samples taken after fasting for at least 8 hours.   Calcium , Ion 1.09 (L) 1.15 - 1.40 mmol/L   TCO2 19 (L) 22 - 32 mmol/L   Hemoglobin 8.2 (L) 13.0 - 17.0 g/dL   HCT 75.9 (L) 60.9 - 47.9 %  Glucose, capillary     Status: Abnormal   Collection Time: 08/09/23  3:29 PM  Result Value Ref  Range   Glucose-Capillary 155 (H) 70 - 99 mg/dL    Comment: Glucose reference range applies only to samples taken after fasting for at least 8 hours.  Glucose, capillary     Status: Abnormal   Collection Time: 08/09/23  9:22 PM  Result Value Ref Range   Glucose-Capillary 193 (H) 70 - 99 mg/dL    Comment: Glucose reference range applies only to samples taken after fasting for at least 8 hours.   Comment 1 Notify RN    Comment 2 Document in Chart   Magnesium      Status: Abnormal   Collection Time: 08/10/23  3:24 AM  Result Value Ref Range   Magnesium  1.6 (L) 1.7 - 2.4 mg/dL    Comment: Performed at [facility], [address], [place], [place] [address]  C-reactive protein     Status: Abnormal   Collection Time: 08/10/23  3:32 AM  Result Value Ref Range   CRP 1.0 (H) <1.0 mg/dL    Comment: Performed at [facility], [address], [place], [place] [address]  Sedimentation rate     Status: Abnormal   Collection Time: 08/10/23  3:32 AM  Result Value Ref Range   Sed Rate 27 (H) 0 - 16 mm/hr    Comment: Performed at [facility], [address], [place], [place] [address]  CBC     Status: Abnormal   Collection Time: 08/10/23  3:32 AM  Result Value Ref Range   WBC 14.3 (H) 4.0 - 10.5 K/uL   RBC 4.03 (L) 4.22 - 5.81 MIL/uL   Hemoglobin 12.1 (L) 13.0 - 17.0 g/dL   HCT 64.2 (L) 60.9 - 47.9 %   MCV 88.6 80.0 - 100.0 fL   MCH 30.0 26.0 - 34.0 pg   MCHC 33.9 30.0 - 36.0 g/dL   RDW 87.3 88.4 - 84.4 %   Platelets 209 150  - 400 K/uL   nRBC 0.0 0.0 - 0.2 %    Comment: Performed at [facility], [address] [place], [place] [address]  Basic metabolic panel     Status: Abnormal   Collection Time: 08/10/23  3:32 AM  Result Value Ref Range   Sodium 135 135 - 145 mmol/L   Potassium 4.4 3.5 - 5.1 mmol/L   Chloride 106 98 - 111 mmol/L   CO2 21 (L) 22 - 32 mmol/L   Glucose, Bld 136 (H) 70 - 99 mg/dL    Comment: Glucose reference range applies only to samples taken after fasting for at least 8 hours.   BUN 28 (H) 8 - 23 mg/dL   Creatinine, Ser 8.48 (H) 0.61 - 1.24 mg/dL   Calcium  9.1 8.9 - 10.3 mg/dL   GFR, Estimated 45 (L) >60 mL/min    Comment: (NOTE) Calculated using the CKD-EPI Creatinine Equation (2021)    Anion gap 8 5 - 15    Comment: Performed at [facility], [address], [place], [place] [address]  Glucose, capillary     Status: Abnormal   Collection Time: 08/10/23  6:26 AM  Result Value Ref Range   Glucose-Capillary 133 (H) 70 - 99 mg/dL    Comment: Glucose reference range applies only to samples taken after fasting for at least 8 hours.   Comment 1 Notify RN    Comment 2 Document in Chart     DG Elbow 2 Views Left Result Date: 08/10/2023 CLINICAL DATA:  Elbow swelling, no known  injury, initial encounter EXAM: LEFT ELBOW - 2 VIEW COMPARISON:  None Available. FINDINGS: Olecranon spurring is noted. Considerable soft tissue swelling over the olecranon is noted consistent with olecranon bursitis. No joint effusion is seen. Mild degenerative changes about the elbow joint are noted. No acute fracture or dislocation is noted. IMPRESSION: Changes of olecranon bursitis. Degenerative changes of the elbow joint without acute bony abnormality. Electronically Signed   By: Oneil Devonshire M.D.   On: 08/10/2023 03:58   ECHOCARDIOGRAM LIMITED Result Date: 08/09/2023    ECHOCARDIOGRAM LIMITED REPORT   Patient Name:   Jeff Burgess Date of Exam: 08/09/2023 Medical Rec #:  [id number]        Height:        69.0 in Accession #:    [id number]       Weight:       197.0 lb Date of Birth:  1935-02-21         BSA:          2.053 m Patient Age:    88 years         BP:           139/70 mmHg Patient Gender: M                HR:           77 bpm. Exam Location:  Inpatient Procedure: 2D Echo, Cardiac Doppler and Color Doppler (Both Spectral and Color            Flow Doppler were utilized during procedure). Indications:     TAVR  History:         Patient has prior history of Echocardiogram examinations, most                  recent 02/23/2023. CHF, CAD, Prior CABG, Aortic Valve Disease;                  Risk Factors:Hypertension and Diabetes.  Sonographer:     Jayson Gaskins Referring Phys:  [phone] Bobby Ragan COOPER Diagnosing Phys: Stanly Leavens MD IMPRESSIONS  1. Left ventricular ejection fraction, by estimation, is 50%. The left ventricle has low normal function.  2. Prior to procedure, mild trivial aortic regurgitation. Severe aortic stenosis: Mean gradient 34 mm Hg and Peak gradient 48 mm Hg, DVI 0.22, AVA 0.22     After procedure a 23 mm Sapien Valve. Mean gradient 6 mm Hg, Peak gradient 10 mm Hg, DVI 0.7 and EOA 1.74 cm2. Echo findings are consistent with normal structure and function of the aortic valve prosthesis. FINDINGS  Left Ventricle: Left ventricular ejection fraction, by estimation, is 50%. The left ventricle has low normal function. Aortic Valve: Prior to procedure, mild trivial aortic regurgitation. Severe aortic stenosis: Mean gradient 34 mm Hg and Peak gradient 48 mm Hg, DVI 0.22, AVA 0.22 After procedure a 23 mm Sapien Valve. Mean gradient 6 mm Hg, Peak gradient 10 mm Hg, DVI 0.7 and EOA 1.74 cm2. Aortic valve mean gradient measures 20.0 mmHg. Aortic valve peak gradient measures 25.7 mmHg. Aortic valve area, by VTI measures 0.77 cm. There is a 23 mm Edwards Sapien prosthetic, stented (TAVR) valve present in the aortic position. Echo findings are consistent with normal structure and function of the aortic  valve prosthesis. LEFT VENTRICLE PLAX 2D LVOT diam:     1.80 cm LV SV:         55 LV SV Index:   27 LVOT Area:  2.54 cm  AORTIC VALVE AV Area (Vmax):    1.00 cm AV Area (Vmean):   0.90 cm AV Area (VTI):     0.77 cm AV Vmax:           253.50 cm/s AV Vmean:          199.000 cm/s AV VTI:            0.721 m AV Peak Grad:      25.7 mmHg AV Mean Grad:      20.0 mmHg LVOT Vmax:         99.73 cm/s LVOT Vmean:        70.100 cm/s LVOT VTI:          0.217 m LVOT/AV VTI ratio: 0.30  SHUNTS Systemic VTI:  0.22 m Systemic Diam: 1.80 cm Stanly Leavens MD Electronically signed by Stanly Leavens MD Signature Date/Time: 08/09/2023/3:39:09 PM    Final    Structural Heart Procedure Result Date: 08/09/2023 See surgical note for result.   Review of Systems  Constitutional:  Positive for fever. Negative for chills and diaphoresis.  HENT:  Negative for ear discharge, ear pain, hearing loss and tinnitus.   Eyes:  Negative for photophobia and pain.  Respiratory:  Negative for cough and shortness of breath.   Cardiovascular:  Negative for chest pain.  Gastrointestinal:  Negative for abdominal pain, nausea and vomiting.  Genitourinary:  Negative for dysuria, flank pain, frequency and urgency.  Musculoskeletal:  Positive for arthralgias (Left elbow). Negative for back pain, myalgias and neck pain.  Neurological:  Negative for dizziness and headaches.  Hematological:  Does not bruise/bleed easily.  Psychiatric/Behavioral:  The patient is not nervous/anxious.    Blood pressure (!) 140/58, pulse 99, temperature 99.4 F (37.4 C), temperature source Oral, resp. rate 20, height 5' 9 (1.753 m), weight 91 kg, SpO2 90%. Physical Exam Constitutional:      General: He is not in acute distress.    Appearance: He is well-developed. He is not diaphoretic.  HENT:     Head: Normocephalic and atraumatic.  Eyes:     General: No scleral icterus.       Right eye: No discharge.        Left eye: No discharge.      Conjunctiva/sclera: Conjunctivae normal.  Cardiovascular:     Rate and Rhythm: Normal rate and regular rhythm.  Pulmonary:     Effort: Pulmonary effort is normal. No respiratory distress.  Musculoskeletal:     Cervical back: Normal range of motion.     Comments: Left shoulder, elbow, wrist, digits- no skin wounds, olecranon bursa enlarged, boggy, erythematous, mild TTP, no instability, no blocks to motion  Sens  Ax/R/M/U intact  Mot   Ax/ R/ PIN/ M/ AIN/ U intact  Rad 2+  Skin:    General: Skin is warm and dry.  Neurological:     Mental Status: He is alert.  Psychiatric:        Mood and Affect: Mood normal.        Behavior: Behavior normal.     Assessment/Plan: Left olecranon bursitis -- Have aspirated 4ml bloody fluid with white particulate. Will send for cell count w/crystals and culture. Recommend broad spectrum abx for now, can hopefully narrow once cultures come back.    Ozell DOROTHA Ned, PA-C Orthopedic Surgery [phone] 08/10/2023, 9:57 AM

## 2023-08-10 NOTE — Progress Notes (Signed)
 I was informed that the patient developed left elbow pain and swelling in the setting of fever up to 101.7. Patient is s/p TAVR yesterday. My examination showed a localized area of swelling and tenderness at the dorsal aspect of the elbow with slight hotness (when compared to other areas). X-ray was ordered and showed soft tissue swelling over the olecranon is noted consistent with olecranon bursitis. WBC 14.3K, ESR is pending, CRP 1.0. Blood cultures were also ordered.   Given concern for septic bursitis, started empiric vancomycin . Will also discuss with the day team reaching out to orthopedics for possible aspiration.  Gillian Cass, MD Cardiology Fellow

## 2023-08-10 NOTE — Progress Notes (Signed)
 Pharmacy Antibiotic Note  Jeff Burgess is a 88 y.o. male admitted on 08/09/2023 with septic bursitis.  Pharmacy has been consulted for Vancomycin   dosing.  Plan: Vancomycin  1500 mg IV now, then 1250 mg IV q48h  Height: 5' 9 (175.3 cm) Weight: 91 kg (200 lb 9.6 oz) IBW/kg (Calculated) : 70.7  Temp (24hrs), Avg:98.6 F (37 C), Min:97.6 F (36.4 C), Max:101.7 F (38.7 C)  Recent Labs  Lab 08/05/23 1035 08/09/23 1454 08/10/23 0332  WBC 11.3*  --  14.3*  CREATININE 1.54* 1.20 1.51*    Estimated Creatinine Clearance: 39.1 mL/min (A) (by C-G formula based on SCr of 1.51 mg/dL (H)).    Allergies  Allergen Reactions   Coreg  [Carvedilol ] Other (See Comments)    Leg weakness, myopathy   Lipitor [Atorvastatin ] Other (See Comments)    Leg weakness, myopathy   Mevacor  [Lovastatin ] Other (See Comments)    Leg weakness, myopathy   Morphine Sulfate Other (See Comments)    Diaphoresis Per the pt: After taking morphine, within 30 mins, I was soaking wet. I did not like the way it made me feel.    Jeff Burgess 08/10/2023 5:26 AM

## 2023-08-10 NOTE — Progress Notes (Signed)
 Discharge instructions provided to the patient, patient was educated about Zio patch, had no any questions and concerns, PIV removed, CCMD notified, will pick up TOC meds on way out, had no any concerns during d/c.

## 2023-08-10 NOTE — Progress Notes (Addendum)
 Pharmacy Antibiotic Note  Jeff Burgess is a 88 y.o. male admitted on 08/09/2023 with potential bacteremia v septic bursitis.  Pharmacy has been consulted for vancomycin  dosing. Pt presented febrile (101.7) with leukocytosis (14.3k). Pt was initially dosed based on vancomycin  nomogram using population kinetics and received a 1500 mg load. Renal function worsened (Scr 1.2 > 1.51). Dosing based on AUC dosing.   Plan: Vancomycin  1000mg  IV every 24 hours. (eAUC 455, Scr 1.51, Vd 0.72) Monitor renal function and adjust as appropriate Monitor cultures and clinical picture for de-escalation as appropriate  Height: 5' 9 (175.3 cm) Weight: 91 kg (200 lb 9.6 oz) IBW/kg (Calculated) : 70.7  Temp (24hrs), Avg:98.8 F (37.1 C), Min:97.6 F (36.4 C), Max:101.7 F (38.7 C)  Recent Labs  Lab 08/05/23 1035 08/09/23 1454 08/10/23 0332  WBC 11.3*  --  14.3*  CREATININE 1.54* 1.20 1.51*    Estimated Creatinine Clearance: 39.1 mL/min (A) (by C-G formula based on SCr of 1.51 mg/dL (H)).    Allergies  Allergen Reactions   Coreg  [Carvedilol ] Other (See Comments)    Leg weakness, myopathy   Lipitor [Atorvastatin ] Other (See Comments)    Leg weakness, myopathy   Mevacor  [Lovastatin ] Other (See Comments)    Leg weakness, myopathy   Morphine Sulfate Other (See Comments)    Diaphoresis Per the pt: After taking morphine, within 30 mins, I was soaking wet. I did not like the way it made me feel.    Antimicrobials this admission: Cefazolin  7/29 >> 7/30 Vancomycin  7/30 >>   Dose adjustments this admission:  Microbiology results: 7/30 BCx: pending 7/30 Synovial fluid cx: pending  Thank you for allowing pharmacy to be a part of this patient's care.  Elma Fail, PharmD PGY1 Clinical Pharmacist [facility]  08/10/2023 10:50 AM

## 2023-08-10 NOTE — Plan of Care (Signed)
  Problem: Clinical Measurements: Goal: Will remain free from infection Outcome: Progressing   Problem: Clinical Measurements: Goal: Diagnostic test results will improve Outcome: Progressing   Problem: Coping: Goal: Level of anxiety will decrease Outcome: Progressing   Problem: Nutrition: Goal: Adequate nutrition will be maintained Outcome: Progressing   Problem: Safety: Goal: Ability to remain free from injury will improve Outcome: Progressing   Problem: Pain Managment: Goal: General experience of comfort will improve and/or be controlled Outcome: Progressing   Problem: Skin Integrity: Goal: Risk for impaired skin integrity will decrease Outcome: Progressing

## 2023-08-11 ENCOUNTER — Telehealth: Payer: Self-pay | Admitting: *Deleted

## 2023-08-11 ENCOUNTER — Telehealth: Payer: Self-pay

## 2023-08-11 DIAGNOSIS — I4510 Unspecified right bundle-branch block: Secondary | ICD-10-CM | POA: Diagnosis not present

## 2023-08-11 DIAGNOSIS — I4719 Other supraventricular tachycardia: Secondary | ICD-10-CM | POA: Diagnosis not present

## 2023-08-11 DIAGNOSIS — I4729 Other ventricular tachycardia: Secondary | ICD-10-CM | POA: Diagnosis not present

## 2023-08-11 DIAGNOSIS — Z952 Presence of prosthetic heart valve: Secondary | ICD-10-CM | POA: Diagnosis not present

## 2023-08-11 NOTE — Transitions of Care (Post Inpatient/ED Visit) (Signed)
 08/11/2023  Name: Jeff Burgess MRN: [id number] DOB: [birth year]  Today's TOC FU Call Status: Today's TOC FU Call Status:: Successful TOC FU Call Completed TOC FU Call Complete Date: 08/11/23 Patient's Name and Date of Birth confirmed.  Transition Care Management Follow-up Telephone Call Date of Discharge: 08/10/23 Discharge Facility: [facility]) Type of Discharge: Inpatient Admission Primary Inpatient Discharge Diagnosis:: Planned surgical TAVR: with acute development of gout- elbow How have you been since you were released from the hospital?: Better (I am doing fine, the elbow gout is improving, I am not in pain.  I don't need or want to schedule with my PCP, it was a planned surgery and I am seeing the surgeon next week.  If anything comes up, I will call to schedule a visit) Any questions or concerns?: No  Items Reviewed: Did you receive and understand the discharge instructions provided?: Yes (thoroughly reviewed with patient who verbalizes good understanding of same) Medications obtained,verified, and reconciled?: Yes (Medications Reviewed) (Full medication reconciliation/ review completed; no concerns or discrepancies identified; confirmed patient obtained/ is taking all newly Rx'd medications as instructed; self-manages medications and denies questions/ concerns around medications today) Any new allergies since your discharge?: No Dietary orders reviewed?: Yes Type of Diet Ordered:: Pretty much regular diet Do you have support at home?: Yes People in Home [RPT]: spouse Name of Support/Comfort Primary Source: Reports independent in self-care activities; supportive spouse assists as/ if needed/ indicated  Medications Reviewed Today: Medications Reviewed Today     Reviewed by Corde Antonini M, RN (Registered Nurse) on 08/11/23 at 1439  Med List Status: <None>   Medication Order Taking? Sig Documenting Provider Last Dose Status Informant  amLODipine  (NORVASC ) 5 MG tablet  [id number] Yes TAKE 1 TABLET BY MOUTH DAILY Plotnikov, Aleksei V, MD  Active Self, Pharmacy Records  aspirin  81 MG tablet [id number] Yes Take 2 tablets (162 mg total) by mouth daily. Plotnikov, Karlynn GAILS, MD  Active Self, Pharmacy Records  b complex vitamins capsule [id number] Yes Take 1 capsule by mouth daily. Plotnikov, Aleksei V, MD  Active Self, Pharmacy Records  cholecalciferol  (VITAMIN D ) 25 MCG (1000 UT) tablet [id number] Yes Take 1 tablet (1,000 Units total) by mouth every morning. Follow-up due in April must see provider for refills Plotnikov, Karlynn GAILS, MD  Active Self, Pharmacy Records  CINNAMON PO [id number] Yes Take 3 capsules by mouth daily. OTC cinnamon 1000mg  capsules. [provider]  Active Self, Pharmacy Records  clopidogrel  (PLAVIX ) 75 MG tablet [id number] Yes Take 1 tablet (75 mg total) by mouth daily with breakfast. West, Katlyn D, NP  Active Self, Pharmacy Records  furosemide  (LASIX ) 40 MG tablet [id number] Yes Take 1 tablet (40 mg total) by mouth daily. Verlin Lonni BIRCH, MD  Active Self, Pharmacy Records  gabapentin  (NEURONTIN ) 300 MG capsule [id number] Yes Take 300-600 mg by mouth daily as needed (nerve pain). [provider]  Active Self, Pharmacy Records  HYDROcodone -acetaminophen  (NORCO/VICODIN) 5-325 MG tablet [id number] Yes Take 1 tablet by mouth every 6 (six) hours as needed (pain). [provider]  Active Self, Pharmacy Records  isosorbide  mononitrate (IMDUR ) 30 MG 24 hr tablet [id number] Yes TAKE 1 TABLET BY MOUTH DAILY Plotnikov, Aleksei V, MD  Active Self, Pharmacy Records  metFORMIN  (GLUCOPHAGE -XR) 500 MG 24 hr tablet [id number]  TAKE 1 TABLET BY MOUTH TWICE  DAILY Plotnikov, Aleksei V, MD  Active Self, Pharmacy Records  Multiple Vitamins-Minerals (MENS 50+ MULTIVITAMIN) TABS [id number] Yes Take 1 tablet by mouth daily. [provider]  Active Self, Pharmacy Records  nitroGLYCERIN  (NITROSTAT ) 0.4 MG SL tablet [id number] Yes Place 1 tablet  (0.4 mg total) under the tongue every 5 (five) minutes as needed. Henry Manuelita NOVAK, NP  Active Self, Pharmacy Records  predniSONE  (DELTASONE ) 20 MG tablet [id number] Yes Take 2 tablets (40 mg total) by mouth daily. Sebastian Lamarr SAUNDERS, PA-C  Active   repaglinide  (PRANDIN ) 1 MG tablet [id number] Yes TAKE 1 TABLET (1 MG TOTAL) BY MOUTH 3 (THREE) TIMES DAILY BEFORE MEALS. Plotnikov, Aleksei V, MD  Active Self, Pharmacy Records  telmisartan -hydrochlorothiazide  (MICARDIS  HCT) 80-25 MG tablet [id number] Yes TAKE 1 TABLET BY MOUTH DAILY Plotnikov, Aleksei V, MD  Active Self, Pharmacy Records           Med Note CHRISTIE ALEXANDER   Thu Jun 09, 2023  2:50 PM)    TYLENOL  500 MG tablet [id number] Yes Take 500-1,000 mg by mouth 2 (two) times daily as needed for moderate pain (pain score 4-6), fever or headache. [provider]  Active Self, Pharmacy Records  ZYPITAMAG  4 MG TABS [id number] Yes TAKE 1 TABLET BY MOUTH EVERY DAY Plotnikov, Karlynn GAILS, MD  Active Self, Pharmacy Records           Home Care and Equipment/Supplies: Were Home Health Services Ordered?: No Any new equipment or medical supplies ordered?: No  Functional Questionnaire: Do you need assistance with bathing/showering or dressing?: No Do you need assistance with meal preparation?: Yes (wife assists) Do you need assistance with eating?: No Do you have difficulty maintaining continence: No (Just every now and then, not often) Do you need assistance with getting out of bed/getting out of a chair/moving?: No Do you have difficulty managing or taking your medications?: No  Follow up appointments reviewed: PCP Follow-up appointment confirmed?: No (patient declines scheduling post-hospital discharge PCP visit; verified not indicated per hospital discharging provider discharge notes) MD Provider Line [phone] Given: No (verified well-established with current PCP) Specialist Hospital Follow-up appointment confirmed?:  Yes Date of Specialist follow-up appointment?: 08/18/23 Follow-Up Specialty Provider:: Cardiothoracic surgical provider Do you need transportation to your follow-up appointment?: No Do you understand care options if your condition(s) worsen?: Yes-patient verbalized understanding  SDOH Interventions Today    Flowsheet Row Most Recent Value  SDOH Interventions   Food Insecurity Interventions Intervention Not Indicated  Housing Interventions Intervention Not Indicated  Transportation Interventions Intervention Not Indicated  [drives self]  Utilities Interventions Intervention Not Indicated   See TOC assessment tabs for additional assessment/ TOC intervention information  Patient declines need for ongoing/ further care management/ coordination outreach; declines enrollment in 30-day TOC program- declines taking my direct phone number should needs/ concerns arise post-TOC call   Pls call/ message for questions,  Ronrico Dupin Mckinney Ebon Ketchum, RN, BSN, Media planner  Transitions of Care  [facility] - Population Health  [place] [phone]: direct office

## 2023-08-11 NOTE — Telephone Encounter (Signed)
 Patient contacted regarding discharge from [facility] on 08/10/23  Patient understands to follow up with provider Izetta Hummer, PA-C on 08/18/23 at 2:20PM at [address] Location. Patient understands discharge instructions? Yes Patient understands medications and regiment? Yes Patient understands to bring all medications to this visit? Yes

## 2023-08-12 ENCOUNTER — Telehealth (HOSPITAL_COMMUNITY): Payer: Self-pay

## 2023-08-12 NOTE — Telephone Encounter (Signed)
 Faxed outside referral for Phase II Cardiac Rehab to [facility].

## 2023-08-13 LAB — BODY FLUID CULTURE W GRAM STAIN: Culture: NO GROWTH

## 2023-08-15 DIAGNOSIS — R233 Spontaneous ecchymoses: Secondary | ICD-10-CM | POA: Diagnosis not present

## 2023-08-15 DIAGNOSIS — L578 Other skin changes due to chronic exposure to nonionizing radiation: Secondary | ICD-10-CM | POA: Diagnosis not present

## 2023-08-15 DIAGNOSIS — Z08 Encounter for follow-up examination after completed treatment for malignant neoplasm: Secondary | ICD-10-CM | POA: Diagnosis not present

## 2023-08-15 DIAGNOSIS — L570 Actinic keratosis: Secondary | ICD-10-CM | POA: Diagnosis not present

## 2023-08-15 DIAGNOSIS — Z85828 Personal history of other malignant neoplasm of skin: Secondary | ICD-10-CM | POA: Diagnosis not present

## 2023-08-15 LAB — CULTURE, BLOOD (ROUTINE X 2)
Culture: NO GROWTH
Culture: NO GROWTH
Special Requests: ADEQUATE

## 2023-08-16 NOTE — Progress Notes (Addendum)
 [facility]   MULTIDISCIPLINARY HEART VALVE CLINIC                                     Cardiology Office Note:    Date:  08/19/2023   ID:  Jeff Burgess, DOB April 01, 1935, MRN [id number]  PCP:  Garald Karlynn GAILS, MD  [facility] Cardiologist:  [name], MD  [facility] Structural heart: [name], MD [facility] Electrophysiologist:  None   Referring MD: Garald Karlynn GAILS, MD   [facility] s/p TAVR  History of Present Illness:    Jeff Burgess is a 88 y.o. male with a hx of CAD s/p 1V CABG in 1998 s/p DES to [facility] (06/17/23), carotid artery disease, PAD, HTN, HLD, CKD stage IIIb, DMT2, HFmrEF, bladder cancer s/p TURBT in 2017, chronic RBBB and severe AS s/p TAVR (08/09/23) who presents to clinic for follow up.   His most recent echocardiogram on 02/23/2023 showed EF 50-55%, LFLG severe AS with mean gradient 25 mmHg, AVA 0.83 cm2, DI 0.26, SVI 33. Cardiac catheterization 06/17/2023 showed chronic occlusion of the ostial LAD with a patent LIMA to the LAD filling the entire vessel. There was a large left circumflex with severe mid vessel stenosis successfully treated with PTCA and DES x 1. The RCA had 50% proximal stenosis and 70% PDA stenosis. He had some symptom improvement after PCI but continued to have significant exertional fatigue and shortness of breath even with walking short distances as well as lower extremity edema and orthopnea. He was started on diuretic therapy. S/p successful TAVR with a 23 mm Edwards Sapien 3 Ultra Resilia THV via the TF approach on 08/09/23. Post operative echo showed EF 60%, normally functioning TAVR with a mean gradient of 13 mmHg and no PVL. Hospital course complivated by ~102F fever, leukocytosis, elevated inflammatory markers and new elbow pain/swelling. Seen by ortho and elbow aspiration c/w gout. He was discharged on oral steroids. Given an underlying RBBB and new 1st deg AV block, he was discharged with a Zio AT.  Today  the patient presents to clinic for follow up. Here with his daughter. No CP or SOB. No LE edema, orthopnea or PND. He has had some mild dizziness but no syncope. No blood in stool or urine. No palpitations. Elbow still swollen but no longer painful.    Past Medical History:  Diagnosis Date   Benign localized prostatic hyperplasia with lower urinary tract symptoms (LUTS)    Bladder cancer [facility]) dx 05/ 2017--  urologist- dr chauncey   TCC , Ta of bladder  s/p TURBT 06-02-2015   CAD (coronary artery disease)    no cardiologist--  followed by pcp,  dr plontnikov   Chronic low back pain with sciatica    neuropathy feet   CKD stage 3b, GFR 30-44 ml/min (HCC)    Diabetes mellitus, type 2 (HCC)    ED (erectile dysfunction)    Full dentures    Heart failure with mildly reduced ejection fraction (HFmrEF) (HCC)    History of kidney stones    Hyperlipidemia    Hypertension    Hypogonadism male    OA (osteoarthritis)    PAD (peripheral artery disease) (HCC)    Right bundle branch block (RBBB)    S/P CABG x 1    1998   S/P TAVR (transcatheter aortic valve replacement) 08/09/2023   s/p TAVR with a 23 mm  Edwards Sapien 3 Ultra Resilia THV via the TF approach by Dr. Wonda and Dr. Lucas   Severe aortic stenosis    Sigmoid diverticulosis    Ventral hernia    midline   Wears glasses      Current Medications: Current Meds  Medication Sig   b complex vitamins capsule Take 1 capsule by mouth daily.   cholecalciferol  (VITAMIN D ) 25 MCG (1000 UT) tablet Take 1 tablet (1,000 Units total) by mouth every morning. Follow-up due in April must see provider for refills   CINNAMON PO Take 3 capsules by mouth daily. OTC cinnamon 1000mg  capsules.   clopidogrel  (PLAVIX ) 75 MG tablet Take 1 tablet (75 mg total) by mouth daily with breakfast.   gabapentin  (NEURONTIN ) 300 MG capsule Take 300-600 mg by mouth daily as needed (nerve pain).   HYDROcodone -acetaminophen  (NORCO/VICODIN) 5-325 MG tablet Take 1 tablet  by mouth every 6 (six) hours as needed (pain).   isosorbide  mononitrate (IMDUR ) 30 MG 24 hr tablet TAKE 1 TABLET BY MOUTH DAILY   metFORMIN  (GLUCOPHAGE -XR) 500 MG 24 hr tablet TAKE 1 TABLET BY MOUTH TWICE  DAILY   metoprolol  succinate (TOPROL  XL) 25 MG 24 hr tablet Take 0.5 tablets (12.5 mg total) by mouth daily.   Multiple Vitamins-Minerals (MENS 50+ MULTIVITAMIN) TABS Take 1 tablet by mouth daily.   nitroGLYCERIN  (NITROSTAT ) 0.4 MG SL tablet Place 1 tablet (0.4 mg total) under the tongue every 5 (five) minutes as needed.   repaglinide  (PRANDIN ) 1 MG tablet TAKE 1 TABLET (1 MG TOTAL) BY MOUTH 3 (THREE) TIMES DAILY BEFORE MEALS.   telmisartan -hydrochlorothiazide  (MICARDIS  HCT) 80-25 MG tablet TAKE 1 TABLET BY MOUTH DAILY   TYLENOL  500 MG tablet Take 500-1,000 mg by mouth 2 (two) times daily as needed for moderate pain (pain score 4-6), fever or headache.   ZYPITAMAG  4 MG TABS TAKE 1 TABLET BY MOUTH EVERY DAY   [DISCONTINUED] amLODipine  (NORVASC ) 5 MG tablet TAKE 1 TABLET BY MOUTH DAILY   [DISCONTINUED] furosemide  (LASIX ) 40 MG tablet Take 1 tablet (40 mg total) by mouth daily.      ROS:   Please see the history of present illness.    All other systems reviewed and are negative.  EKGs   EKG Interpretation Date/Time:  Thursday August 18 2023 14:15:24 EDT Ventricular Rate:  103 PR Interval:    QRS Duration:  138 QT Interval:  372 QTC Calculation: 487 R Axis:   -56  Text Interpretation: Sinus with PACs and with premature ventricular or aberrantly conducted complexes Left axis deviation Right bundle branch block Confirmed by Sebastian Collar [phone]) on 08/19/2023 8:50:30 AM   Risk Assessment/Calculations:           Physical Exam:    VS:  BP (!) 100/48   Pulse (!) 103   Ht 5' 9 (1.753 m)   Wt 198 lb 12.8 oz (90.2 kg)   SpO2 97%   BMI 29.36 kg/m     Wt Readings from Last 3 Encounters:  08/18/23 198 lb 12.8 oz (90.2 kg)  08/10/23 200 lb 9.6 oz (91 kg)  07/01/23 198 lb  (89.8 kg)     GEN: Well nourished, well developed in no acute distress NECK: No JVD CARDIAC: mildly tachy and irregular. no murmurs, rubs, gallops RESPIRATORY:  Clear to auscultation without rales, wheezing or rhonchi  ABDOMEN: Soft, non-tender, non-distended EXTREMITIES:  No edema; No deformity.  Groin sites clear without hematoma or ecchymosis. Left elbow with continued swelling but redness/pain gone.  ASSESSMENT:    1. S/P TAVR (transcatheter aortic valve replacement)   2. Acute gout of elbow, unspecified cause, unspecified laterality   3. RBBB   4. Coronary artery disease involving native coronary artery of native heart without angina pectoris   5. PAD (peripheral artery disease) (HCC)   6. Primary hypertension   7. Heart failure with mildly reduced ejection fraction (HFmrEF) (HCC)   8. Hyperlipidemia, unspecified hyperlipidemia type   9. CKD stage 3b, GFR 30-44 ml/min (HCC)   10. Lung mass     PLAN:    In order of problems listed above:  Severe AS s/p TAVR:  -- Pt doing well s/p TAVR.  -- ECG with no HAVB.  -- Groin sites healing well.  -- SBE prophylaxis discussed; the patient is edentulous and does not go to the dentist.  -- Continue DAPT with ASA and Plavix  with recent PCI.  -- Cleared to resume all activities without restriction. -- Planning to [facility] cardiac rehab in [facility]  -- I will see back for 1 month echo and OV.   Acute gout: -- Improved after steroids.    RBBB: -- Zio AT with no HAVB, but is showing frequent ventricular ectopy. -- Start Toprol  XL 12.5 mg daily.    CAD:  -- S/p remote CABG in 1998. -- Patient underwent cardiac catheterization on 06/16/2023 with chronic lesion of ostial LAD, patent LIMA to LAD, 90% M LCx treated with PCI/DES x 1.   -- Continue DAPT with ASA and Plavix  for at least 6 months.  -- Continue pitavsatain 4mg  daily.    PAD: -- Lower extremity Dopplers in 2019 showed total occlusion of the right posterior tibial artery and  left anterior tibial artery.  -- ABI 1.12 on the right and 0.95 on the left.   -- Seen by vascular surgery who felt symptoms related to pseudoclaudication from back issues.  -- Continue antiplatelets and high intensity statin.   HTN:  -- BP on soft side and having some mild dizziness.  -- Stop amlodipine  5 mg daily and change Lasix  40 mg to PRN. -- As above, start Toprol  XL 12.5mg  daily for ectopy.  -- Continue Imdur  30 mg daily and telmisartan -hydrochlorothiazide  80-25 mg daily.    HFmrEF: -- EF 50% -- Appears euvolemic.  -- Continue telmisartan -HCT 80-25 mg daily.  -- Stop amlodipine  5 mg daily and change Lasix  40 mg to PRN. -- As above, start Toprol  XL 12.5mg  daily for ectopy.    HLD: -- Continue pitavastatin  4 mg daily.    CKD stage IIIb:  -- Creat baseline 1.46-1.79. -- Creat 1.51 at discharge.    Abnormal CT chest: -- Pre TAVR CTs showed slightly more prominent and interval increase in size of a right peribronchovascular 1.4 x 1 cm (from 1.1 by 1.1 cm) soft tissue density. Recommend follow-up CT with intravenous contrast in 3 months to further evaluate stability. Additional imaging evaluation or consultation with Pulmonology or Thoracic Surgery recommended. Persistent but nonenlarged right hilar, left hilar, and mediastinal lymph nodes. Recommend attention on follow-up. -- CT with contrast next month arranged.    Medication Adjustments/Labs and Tests Ordered: Current medicines are reviewed at length with the patient today.  Concerns regarding medicines are outlined above.  Orders Placed This Encounter  Procedures   CT Chest W Contrast   EKG 12-Lead   Meds ordered this encounter  Medications   metoprolol  succinate (TOPROL  XL) 25 MG 24 hr tablet    Sig: Take 0.5 tablets (12.5 mg  total) by mouth daily.    Dispense:  45 tablet    Refill:  3   furosemide  (LASIX ) 40 MG tablet    Sig: Take 1 tablet (40 mg total) by mouth as needed.    Dose increase    Patient  Instructions  Medication Instructions:  Your physician has recommended you make the following change in your medication:  Stop Amlodipine . Change Lasix  from taking daily to taking as needed. Start Metoprolol  12.5mg  (a half tablet) daily.   *If you need a refill on your cardiac medications before your next appointment, please call your pharmacy*  Lab Work: None needed   Testing/Procedures: CT Scan of Chest, 10/06/23  Follow-Up: At [facility], you and your health needs are our priority.  As part of our continuing mission to provide you with exceptional heart care, our providers are all part of one team.  This team includes your primary Cardiologist (physician) and Advanced Practice Providers or APPs (Physician Assistants and Nurse Practitioners) who all work together to provide you with the care you need, when you need it.  Your next appointment:   As scheduled  Provider:   Izetta Hummer, PA-C    We recommend signing up for the patient portal called MyChart.  Sign up information is provided on this After Visit Summary.  MyChart is used to connect with patients for Virtual Visits (Telemedicine).  Patients are able to view lab/test results, encounter notes, upcoming appointments, etc.  Non-urgent messages can be sent to your provider as well.   To learn more about what you can do with MyChart, go to [email or web address].        Signed, Lamarr Hummer, PA-C  08/19/2023 12:16 PM     [facility]

## 2023-08-18 ENCOUNTER — Ambulatory Visit: Attending: Physician Assistant | Admitting: Physician Assistant

## 2023-08-18 ENCOUNTER — Other Ambulatory Visit: Payer: Self-pay

## 2023-08-18 VITALS — BP 100/48 | HR 103 | Ht 69.0 in | Wt 198.8 lb

## 2023-08-18 DIAGNOSIS — M109 Gout, unspecified: Secondary | ICD-10-CM | POA: Diagnosis not present

## 2023-08-18 DIAGNOSIS — I739 Peripheral vascular disease, unspecified: Secondary | ICD-10-CM | POA: Diagnosis not present

## 2023-08-18 DIAGNOSIS — R918 Other nonspecific abnormal finding of lung field: Secondary | ICD-10-CM

## 2023-08-18 DIAGNOSIS — I5022 Chronic systolic (congestive) heart failure: Secondary | ICD-10-CM

## 2023-08-18 DIAGNOSIS — I4510 Unspecified right bundle-branch block: Secondary | ICD-10-CM | POA: Diagnosis not present

## 2023-08-18 DIAGNOSIS — Z952 Presence of prosthetic heart valve: Secondary | ICD-10-CM | POA: Diagnosis not present

## 2023-08-18 DIAGNOSIS — E785 Hyperlipidemia, unspecified: Secondary | ICD-10-CM | POA: Diagnosis not present

## 2023-08-18 DIAGNOSIS — I10 Essential (primary) hypertension: Secondary | ICD-10-CM | POA: Diagnosis not present

## 2023-08-18 DIAGNOSIS — I2510 Atherosclerotic heart disease of native coronary artery without angina pectoris: Secondary | ICD-10-CM

## 2023-08-18 DIAGNOSIS — N1832 Chronic kidney disease, stage 3b: Secondary | ICD-10-CM

## 2023-08-18 MED ORDER — FUROSEMIDE 40 MG PO TABS: 40.0000 mg | ORAL_TABLET | ORAL | Status: AC | PRN

## 2023-08-18 MED ORDER — METOPROLOL SUCCINATE ER 25 MG PO TB24: 12.5000 mg | ORAL_TABLET | Freq: Every day | ORAL | 3 refills | Status: DC

## 2023-08-18 NOTE — Patient Instructions (Addendum)
 Medication Instructions:  Your physician has recommended you make the following change in your medication:  Stop Amlodipine . Change Lasix  from taking daily to taking as needed. Start Metoprolol  12.5mg  (a half tablet) daily.   *If you need a refill on your cardiac medications before your next appointment, please call your pharmacy*  Lab Work: None needed   Testing/Procedures: CT Scan of Chest, 10/06/23  Follow-Up: At [facility], you and your health needs are our priority.  As part of our continuing mission to provide you with exceptional heart care, our providers are all part of one team.  This team includes your primary Cardiologist (physician) and Advanced Practice Providers or APPs (Physician Assistants and Nurse Practitioners) who all work together to provide you with the care you need, when you need it.  Your next appointment:   As scheduled  Provider:   Izetta Hummer, PA-C    We recommend signing up for the patient portal called MyChart.  Sign up information is provided on this After Visit Summary.  MyChart is used to connect with patients for Virtual Visits (Telemedicine).  Patients are able to view lab/test results, encounter notes, upcoming appointments, etc.  Non-urgent messages can be sent to your provider as well.   To learn more about what you can do with MyChart, go to [email or web address].

## 2023-08-23 DIAGNOSIS — C678 Malignant neoplasm of overlapping sites of bladder: Secondary | ICD-10-CM | POA: Diagnosis not present

## 2023-09-02 ENCOUNTER — Telehealth: Payer: Self-pay | Admitting: Physician Assistant

## 2023-09-02 MED ORDER — METOPROLOL SUCCINATE ER 25 MG PO TB24: 12.5000 mg | ORAL_TABLET | Freq: Every day | ORAL | 3 refills | Status: AC

## 2023-09-02 NOTE — Telephone Encounter (Signed)
 *  STAT* If patient is at the pharmacy, call can be transferred to refill team.   1. Which medications need to be refilled? (please list name of each medication and dose if known)   metoprolol  succinate (TOPROL  XL) 25 MG 24 hr tablet     2. Would you like to learn more about the convenience, safety, & potential cost savings by using the [facility]? No    3. Are you open to using the [facility] (Type [facility]. ). No    4. Which pharmacy/location (including street and city if local pharmacy) is medication to be sent to?  [id number] - [place], [place] - [address] [facility]     5. Do they need a 30 day or 90 day supply? 90 days   Patient said he did not receive this refill through [facility] and he is requesting to resend refill to  [id number] - [place], [place] - [address] AT [facility]

## 2023-09-02 NOTE — Telephone Encounter (Signed)
 Pt's medication was sent to pt's pharmacy as requested. Confirmation received.

## 2023-09-05 DIAGNOSIS — I214 Non-ST elevation (NSTEMI) myocardial infarction: Secondary | ICD-10-CM | POA: Diagnosis not present

## 2023-09-05 DIAGNOSIS — I252 Old myocardial infarction: Secondary | ICD-10-CM | POA: Diagnosis not present

## 2023-09-05 DIAGNOSIS — Z955 Presence of coronary angioplasty implant and graft: Secondary | ICD-10-CM | POA: Diagnosis not present

## 2023-09-06 NOTE — Addendum Note (Signed)
 Encounter addended by: Malvina Pina A on: 09/06/2023 1:24 PM  Actions taken: Imaging Exam ended

## 2023-09-07 ENCOUNTER — Ambulatory Visit: Payer: Self-pay | Admitting: Physician Assistant

## 2023-09-07 DIAGNOSIS — Z952 Presence of prosthetic heart valve: Secondary | ICD-10-CM

## 2023-09-07 DIAGNOSIS — I214 Non-ST elevation (NSTEMI) myocardial infarction: Secondary | ICD-10-CM | POA: Diagnosis not present

## 2023-09-07 DIAGNOSIS — I472 Ventricular tachycardia, unspecified: Secondary | ICD-10-CM

## 2023-09-07 DIAGNOSIS — I4510 Unspecified right bundle-branch block: Secondary | ICD-10-CM

## 2023-09-07 DIAGNOSIS — Z955 Presence of coronary angioplasty implant and graft: Secondary | ICD-10-CM | POA: Diagnosis not present

## 2023-09-08 DIAGNOSIS — Z955 Presence of coronary angioplasty implant and graft: Secondary | ICD-10-CM | POA: Diagnosis not present

## 2023-09-08 DIAGNOSIS — I214 Non-ST elevation (NSTEMI) myocardial infarction: Secondary | ICD-10-CM | POA: Diagnosis not present

## 2023-09-12 ENCOUNTER — Encounter (HOSPITAL_COMMUNITY): Payer: Self-pay | Admitting: Surgery

## 2023-09-14 ENCOUNTER — Encounter: Payer: Self-pay | Admitting: Internal Medicine

## 2023-09-14 ENCOUNTER — Ambulatory Visit: Admitting: Internal Medicine

## 2023-09-14 VITALS — BP 150/60 | HR 54 | Ht 69.0 in

## 2023-09-14 DIAGNOSIS — N1832 Chronic kidney disease, stage 3b: Secondary | ICD-10-CM

## 2023-09-14 DIAGNOSIS — E785 Hyperlipidemia, unspecified: Secondary | ICD-10-CM | POA: Diagnosis not present

## 2023-09-14 DIAGNOSIS — M7022 Olecranon bursitis, left elbow: Secondary | ICD-10-CM

## 2023-09-14 DIAGNOSIS — R270 Ataxia, unspecified: Secondary | ICD-10-CM

## 2023-09-14 DIAGNOSIS — I2510 Atherosclerotic heart disease of native coronary artery without angina pectoris: Secondary | ICD-10-CM

## 2023-09-14 DIAGNOSIS — E1169 Type 2 diabetes mellitus with other specified complication: Secondary | ICD-10-CM | POA: Diagnosis not present

## 2023-09-14 DIAGNOSIS — M7030 Other bursitis of elbow, unspecified elbow: Secondary | ICD-10-CM | POA: Insufficient documentation

## 2023-09-14 DIAGNOSIS — Z23 Encounter for immunization: Secondary | ICD-10-CM | POA: Diagnosis not present

## 2023-09-14 DIAGNOSIS — Z7984 Long term (current) use of oral hypoglycemic drugs: Secondary | ICD-10-CM | POA: Diagnosis not present

## 2023-09-14 LAB — COMPREHENSIVE METABOLIC PANEL WITH GFR
ALT: 23 U/L (ref 0–53)
AST: 22 U/L (ref 0–37)
Albumin: 4.2 g/dL (ref 3.5–5.2)
Alkaline Phosphatase: 84 U/L (ref 39–117)
BUN: 32 mg/dL — ABNORMAL HIGH (ref 6–23)
CO2: 28 meq/L (ref 19–32)
Calcium: 9.8 mg/dL (ref 8.4–10.5)
Chloride: 104 meq/L (ref 96–112)
Creatinine, Ser: 1.66 mg/dL — ABNORMAL HIGH (ref 0.40–1.50)
GFR: 37.1 mL/min — ABNORMAL LOW (ref >60.00)
Glucose, Bld: 117 mg/dL — ABNORMAL HIGH (ref 70–99)
Potassium: 4.5 meq/L (ref 3.5–5.1)
Sodium: 140 meq/L (ref 135–145)
Total Bilirubin: 0.3 mg/dL (ref 0.2–1.2)
Total Protein: 7.1 g/dL (ref 6.0–8.3)

## 2023-09-14 LAB — LIPID PANEL
Cholesterol: 110 mg/dL (ref 0–200)
HDL: 28.7 mg/dL — ABNORMAL LOW (ref >39.00)
LDL Cholesterol: 47 mg/dL (ref 0–99)
NonHDL: 81.69
Total CHOL/HDL Ratio: 4
Triglycerides: 173 mg/dL — ABNORMAL HIGH (ref 0.0–149.0)
VLDL: 34.6 mg/dL (ref 0.0–40.0)

## 2023-09-14 LAB — URIC ACID: Uric Acid, Serum: 9.9 mg/dL — ABNORMAL HIGH (ref 4.0–7.8)

## 2023-09-14 LAB — HEMOGLOBIN A1C: Hgb A1c MFr Bld: 7.5 % — ABNORMAL HIGH (ref 4.6–6.5)

## 2023-09-14 MED ORDER — METHYLPREDNISOLONE 4 MG PO TBPK: ORAL_TABLET | ORAL | 0 refills | Status: DC

## 2023-09-14 MED ORDER — BLOOD GLUCOSE TEST VI STRP: 1.0000 | ORAL_STRIP | Freq: Three times a day (TID) | 0 refills | Status: AC

## 2023-09-14 MED ORDER — BLOOD GLUCOSE MONITORING SUPPL DEVI: 1.0000 | Freq: Three times a day (TID) | 0 refills | Status: AC

## 2023-09-14 MED ORDER — LANCET DEVICE MISC: 1.0000 | Freq: Three times a day (TID) | 0 refills | Status: AC

## 2023-09-14 MED ORDER — LANCETS MISC. MISC: 1.0000 | Freq: Three times a day (TID) | 0 refills | Status: AC

## 2023-09-14 NOTE — Assessment & Plan Note (Signed)
 Gout related since 07/2023

## 2023-09-14 NOTE — Assessment & Plan Note (Signed)
Cont on Prandin ?

## 2023-09-14 NOTE — Assessment & Plan Note (Signed)
Using a walker 

## 2023-09-14 NOTE — Assessment & Plan Note (Signed)
 Hydrate well

## 2023-09-14 NOTE — Progress Notes (Signed)
 Subjective:  Patient ID: Jeff Burgess, male    DOB: February 28, 1935  Age: 88 y.o. MRN: [id number]  CC: Follow-up (30mo HTN; medication help swelling in legs brings BP very low; also want to discuss gout flare up in L elbow) and Medication Problem (Aspirin , wanted to discuss how many to take a day)   HPI Jeff Burgess presents for s/p TAVR 07/2023, CAD, CKD. DM C/o elbow pain (L) Outpatient Medications Prior to Visit  Medication Sig Dispense Refill   aspirin  81 MG tablet Take 2 tablets (162 mg total) by mouth daily. 100 tablet 3   b complex vitamins capsule Take 1 capsule by mouth daily.     cholecalciferol  (VITAMIN D ) 25 MCG (1000 UT) tablet Take 1 tablet (1,000 Units total) by mouth every morning. Follow-up due in April must see provider for refills 90 tablet 0   CINNAMON PO Take 3 capsules by mouth daily. OTC cinnamon 1000mg  capsules.     clopidogrel  (PLAVIX ) 75 MG tablet Take 1 tablet (75 mg total) by mouth daily with breakfast. 90 tablet 2   furosemide  (LASIX ) 40 MG tablet Take 1 tablet (40 mg total) by mouth as needed.     gabapentin  (NEURONTIN ) 300 MG capsule Take 300-600 mg by mouth daily as needed (nerve pain).     HYDROcodone -acetaminophen  (NORCO/VICODIN) 5-325 MG tablet Take 1 tablet by mouth every 6 (six) hours as needed (pain).     isosorbide  mononitrate (IMDUR ) 30 MG 24 hr tablet TAKE 1 TABLET BY MOUTH DAILY 100 tablet 2   metFORMIN  (GLUCOPHAGE -XR) 500 MG 24 hr tablet TAKE 1 TABLET BY MOUTH TWICE  DAILY 200 tablet 2   metoprolol  succinate (TOPROL  XL) 25 MG 24 hr tablet Take 0.5 tablets (12.5 mg total) by mouth daily. 45 tablet 3   Multiple Vitamins-Minerals (MENS 50+ MULTIVITAMIN) TABS Take 1 tablet by mouth daily.     nitroGLYCERIN  (NITROSTAT ) 0.4 MG SL tablet Place 1 tablet (0.4 mg total) under the tongue every 5 (five) minutes as needed. 25 tablet 2   repaglinide  (PRANDIN ) 1 MG tablet TAKE 1 TABLET (1 MG TOTAL) BY MOUTH 3 (THREE) TIMES DAILY BEFORE MEALS. 300 tablet 3    telmisartan -hydrochlorothiazide  (MICARDIS  HCT) 80-25 MG tablet TAKE 1 TABLET BY MOUTH DAILY 90 tablet 3   TYLENOL  500 MG tablet Take 500-1,000 mg by mouth 2 (two) times daily as needed for moderate pain (pain score 4-6), fever or headache.     ZYPITAMAG  4 MG TABS TAKE 1 TABLET BY MOUTH EVERY DAY 90 tablet 3   predniSONE  (DELTASONE ) 20 MG tablet Take 2 tablets (40 mg total) by mouth daily. (Patient not taking: Reported on 09/14/2023) 10 tablet 0   No facility-administered medications prior to visit.    ROS: Review of Systems  Constitutional:  Positive for fatigue. Negative for appetite change and unexpected weight change.  HENT:  Negative for congestion, nosebleeds, sneezing, sore throat and trouble swallowing.   Eyes:  Negative for itching and visual disturbance.  Respiratory:  Negative for cough.   Cardiovascular:  Negative for chest pain, palpitations and leg swelling.  Gastrointestinal:  Negative for abdominal distention, blood in stool, diarrhea and nausea.  Genitourinary:  Negative for frequency and hematuria.  Musculoskeletal:  Positive for back pain and gait problem. Negative for joint swelling and neck pain.  Skin:  Negative for rash.  Neurological:  Positive for weakness. Negative for dizziness, tremors and speech difficulty.  Psychiatric/Behavioral:  Negative for agitation, dysphoric mood and sleep disturbance. The patient  is not nervous/anxious.     Objective:  BP (!) 150/60   Pulse (!) 54   Ht 5' 9 (1.753 m)   SpO2 97%   BMI 29.36 kg/m   BP Readings from Last 3 Encounters:  09/14/23 (!) 150/60  08/18/23 (!) 100/48  08/10/23 132/79    Wt Readings from Last 3 Encounters:  08/18/23 198 lb 12.8 oz (90.2 kg)  08/10/23 200 lb 9.6 oz (91 kg)  07/01/23 198 lb (89.8 kg)    Physical Exam Constitutional:      General: He is not in acute distress.    Appearance: He is well-developed. He is obese.     Comments: NAD  Eyes:     Conjunctiva/sclera: Conjunctivae normal.      Pupils: Pupils are equal, round, and reactive to light.  Neck:     Thyroid : No thyromegaly.     Vascular: No JVD.  Cardiovascular:     Rate and Rhythm: Normal rate and regular rhythm.     Heart sounds: Normal heart sounds. No murmur heard.    No friction rub. No gallop.  Pulmonary:     Effort: Pulmonary effort is normal. No respiratory distress.     Breath sounds: Normal breath sounds. No wheezing or rales.  Chest:     Chest wall: No tenderness.  Abdominal:     General: Bowel sounds are normal. There is no distension.     Palpations: Abdomen is soft. There is no mass.     Tenderness: There is no abdominal tenderness. There is no guarding or rebound.  Musculoskeletal:        General: No tenderness. Normal range of motion.     Cervical back: Normal range of motion.     Right lower leg: No edema.     Left lower leg: No edema.  Lymphadenopathy:     Cervical: No cervical adenopathy.  Skin:    General: Skin is warm and dry.     Findings: No rash.  Neurological:     Mental Status: He is alert and oriented to person, place, and time. Mental status is at baseline.     Cranial Nerves: No cranial nerve deficit.     Motor: No abnormal muscle tone.     Coordination: Coordination abnormal.     Gait: Gait abnormal.     Deep Tendon Reflexes: Reflexes are normal and symmetric.  Psychiatric:        Behavior: Behavior normal.        Thought Content: Thought content normal.        Judgment: Judgment normal.    L elbow is w/swollen bursa Weak legs, using a walker  Lab Results  Component Value Date   WBC 14.3 (H) 08/10/2023   HGB 12.1 (L) 08/10/2023   HCT 35.7 (L) 08/10/2023   PLT 209 08/10/2023   GLUCOSE 136 (H) 08/10/2023   CHOL 121 12/20/2022   TRIG 151.0 (H) 12/20/2022   HDL 32.40 (L) 12/20/2022   LDLDIRECT 78.4 11/02/2011   LDLCALC 59 12/20/2022   ALT 30 08/05/2023   AST 25 08/05/2023   NA 135 08/10/2023   K 4.4 08/10/2023   CL 106 08/10/2023   CREATININE 1.51 (H)  08/10/2023   BUN 28 (H) 08/10/2023   CO2 21 (L) 08/10/2023   TSH 2.79 12/20/2022   PSA 2.67 01/22/2021   INR 1.0 08/05/2023   HGBA1C 7.8 (H) 02/18/2023    ECHOCARDIOGRAM COMPLETE Result Date: 08/10/2023    ECHOCARDIOGRAM REPORT  Patient Name:   CORDERIUS SARACENI Date of Exam: 08/10/2023 Medical Rec #:  [id number]        Height:       69.0 in Accession #:    [id number]       Weight:       200.6 lb Date of Birth:  [birth year]         BSA:          2.069 m Patient Age:    88 years         BP:           165/84 mmHg Patient Gender: M                HR:           93 bpm. Exam Location:  Inpatient Procedure: 2D Echo, Cardiac Doppler, Color Doppler and Intracardiac            Opacification Agent (Both Spectral and Color Flow Doppler were            utilized during procedure). Indications:    S/P TAVR  History:        Patient has prior history of Echocardiogram examinations. TAVR;                 Risk Factors:Hypertension.                 Aortic Valve: 23 mm Edwards Sapien prosthetic, stented (TAVR)                 valve is present in the aortic position.  Sonographer:    Vella Key Referring Phys: KATHRYN R THOMPSON IMPRESSIONS  1. Left ventricular ejection fraction, by estimation, is 55 to 60%. Left ventricular ejection fraction by 2D MOD biplane is 59.7 %. The left ventricle has normal function. The left ventricle has no regional wall motion abnormalities. Left ventricular diastolic parameters were normal.  2. Right ventricular systolic function is normal. The right ventricular size is normal.  3. The mitral valve is normal in structure. No evidence of mitral valve regurgitation. No evidence of mitral stenosis.  4. Mildly elevated mean gradient, similar to yesterday. Normal acceleration time and DVI, suspect normal valve function. . The aortic valve has been repaired/replaced. Aortic valve regurgitation is not visualized. No aortic stenosis is present. There is  a 23 mm Edwards Sapien prosthetic (TAVR) valve present  in the aortic position. Echo findings are consistent with normal structure and function of the aortic valve prosthesis.  5. The inferior vena cava is normal in size with greater than 50% respiratory variability, suggesting right atrial pressure of 3 mmHg. FINDINGS  Left Ventricle: Left ventricular ejection fraction, by estimation, is 55 to 60%. Left ventricular ejection fraction by 2D MOD biplane is 59.7 %. The left ventricle has normal function. The left ventricle has no regional wall motion abnormalities. The left ventricular internal cavity size was normal in size. There is no left ventricular hypertrophy. Left ventricular diastolic parameters were normal. Right Ventricle: The right ventricular size is normal. No increase in right ventricular wall thickness. Right ventricular systolic function is normal. Left Atrium: Left atrial size was normal in size. Right Atrium: Right atrial size was normal in size. Pericardium: There is no evidence of pericardial effusion. Mitral Valve: The mitral valve is normal in structure. No evidence of mitral valve regurgitation. No evidence of mitral valve stenosis. Tricuspid Valve: The tricuspid valve is normal in structure. Tricuspid valve regurgitation is mild . No  evidence of tricuspid stenosis. Aortic Valve: Mildly elevated mean gradient, similar to yesterday. Normal acceleration time and DVI, suspect normal valve function. The aortic valve has been repaired/replaced. Aortic valve regurgitation is not visualized. No aortic stenosis is present. Aortic valve mean gradient measures 13.0 mmHg. Aortic valve peak gradient measures 20.2 mmHg. Aortic valve area, by VTI measures 0.79 cm. There is a 23 mm Edwards Sapien prosthetic, stented (TAVR) valve present in the aortic position. Echo findings are consistent with normal structure and function of the aortic valve prosthesis. Pulmonic Valve: The pulmonic valve was normal in structure. Pulmonic valve regurgitation is not visualized. No  evidence of pulmonic stenosis. Aorta: The aortic root is normal in size and structure. Venous: The inferior vena cava is normal in size with greater than 50% respiratory variability, suggesting right atrial pressure of 3 mmHg. IAS/Shunts: No atrial level shunt detected by color flow Doppler.  LEFT VENTRICLE PLAX 2D                        Biplane EF (MOD) LVIDd:         5.10 cm         LV Biplane EF:   Left LVIDs:         3.50 cm                          ventricular LV PW:         1.30 cm                          ejection LV IVS:        1.30 cm                          fraction by LVOT diam:     1.40 cm                          2D MOD LV SV:         33                               biplane is LV SV Index:   16                               59.7 %. LVOT Area:     1.54 cm                                Diastology                                LV e' medial:    7.51 cm/s LV Volumes (MOD)               LV E/e' medial:  11.1 LV vol d, MOD    91.4 ml       LV e' lateral:   8.92 cm/s A2C:                           LV E/e' lateral: 9.3 LV vol d, MOD  54.2 ml A4C: LV vol s, MOD    29.0 ml A2C: LV vol s, MOD    28.0 ml A4C: LV SV MOD A2C:   62.4 ml LV SV MOD A4C:   54.2 ml LV SV MOD BP:    46.0 ml RIGHT VENTRICLE RV Basal diam:  3.70 cm RV S prime:     12.50 cm/s TAPSE (M-mode): 1.8 cm LEFT ATRIUM           Index        RIGHT ATRIUM           Index LA diam:      4.10 cm 1.98 cm/m   RA Area:     18.60 cm LA Vol (A2C): 44.5 ml 21.51 ml/m  RA Volume:   50.60 ml  24.46 ml/m LA Vol (A4C): 30.4 ml 14.70 ml/m  AORTIC VALVE AV Area (Vmax):    0.92 cm AV Area (Vmean):   0.81 cm AV Area (VTI):     0.79 cm AV Vmax:           225.00 cm/s AV Vmean:          169.000 cm/s AV VTI:            0.423 m AV Peak Grad:      20.2 mmHg AV Mean Grad:      13.0 mmHg LVOT Vmax:         134.00 cm/s LVOT Vmean:        88.600 cm/s LVOT VTI:          0.217 m LVOT/AV VTI ratio: 0.51  AORTA Ao Root diam: 3.10 cm Ao Asc diam:  3.20 cm MITRAL VALVE  MV Area (PHT): 6.07 cm     SHUNTS MV Decel Time: 125 msec     Systemic VTI:  0.22 m MV E velocity: 83.30 cm/s   Systemic Diam: 1.40 cm MV A velocity: 110.00 cm/s MV E/A ratio:  0.76 Morene Brownie Electronically signed by Morene Brownie Signature Date/Time: 08/10/2023/11:08:50 AM    Final    DG Elbow 2 Views Left Result Date: 08/10/2023 CLINICAL DATA:  Elbow swelling, no known injury, initial encounter EXAM: LEFT ELBOW - 2 VIEW COMPARISON:  None Available. FINDINGS: Olecranon spurring is noted. Considerable soft tissue swelling over the olecranon is noted consistent with olecranon bursitis. No joint effusion is seen. Mild degenerative changes about the elbow joint are noted. No acute fracture or dislocation is noted. IMPRESSION: Changes of olecranon bursitis. Degenerative changes of the elbow joint without acute bony abnormality. Electronically Signed   By: Oneil Devonshire M.D.   On: 08/10/2023 03:58    Assessment & Plan:   Problem List Items Addressed This Visit     Ataxia   Using a walker      Bursitis of elbow - Primary   Gout related since 07/2023      Relevant Orders   Uric acid   CAD (coronary artery disease)    ASA, Micardis  HCT, Pravastatin  Heart Cath is pending - Dr Verlin s/p TAVR 07/2023      Relevant Orders   Uric acid   Hemoglobin A1c   Comprehensive metabolic panel with GFR   Lipid panel   CKD stage 3b, GFR 30-44 ml/min (HCC)   Hydrate well      Diabetes mellitus, type 2 (HCC)   Cont on Prandin       Relevant Orders   Uric acid   Hemoglobin A1c   Comprehensive metabolic panel with  GFR   Lipid panel   Dyslipidemia   Relevant Orders   Lipid panel   Other Visit Diagnoses       Encounter for immunization       Relevant Orders   Flu vaccine HIGH DOSE PF(Fluzone Trivalent) (Completed)         Meds ordered this encounter  Medications   Blood Glucose Monitoring Suppl DEVI    Sig: 1 each by Does not apply route in the morning, at noon, and at bedtime.  May substitute to any manufacturer covered by patient's insurance.    Dispense:  1 each    Refill:  0   Glucose Blood (BLOOD GLUCOSE TEST STRIPS) STRP    Sig: 1 each by In Vitro route in the morning, at noon, and at bedtime. May substitute to any manufacturer covered by patient's insurance.    Dispense:  100 strip    Refill:  0   Lancet Device MISC    Sig: 1 each by Does not apply route in the morning, at noon, and at bedtime. May substitute to any manufacturer covered by patient's insurance.    Dispense:  1 each    Refill:  0   Lancets Misc. MISC    Sig: 1 each by Does not apply route in the morning, at noon, and at bedtime. May substitute to any manufacturer covered by patient's insurance.    Dispense:  100 each    Refill:  0   methylPREDNISolone  (MEDROL  DOSEPAK) 4 MG TBPK tablet    Sig: As directed    Dispense:  21 tablet    Refill:  0      Follow-up: Return in about 2 months (around 11/14/2023) for a follow-up visit.  Marolyn Noel, MD

## 2023-09-14 NOTE — Patient Instructions (Signed)
 USEFUL THINGS FOR ARTHRITIS and musculoskeletal pains:    A rice sock heating pad refers to a homemade heating pad created by filling a sock with uncooked rice, which can be heated in a microwave to provide a warm compress for sore muscles, pain relief, or other applications; essentially, it's a simple way to generate heat using readily available materials.  Key points about rice sock heat: How to make it: Fill a clean sock (preferably a tube sock) about 2/3 full with uncooked rice, tie a knot at the top to secure the rice inside.  Heating it up: Place the rice sock in the microwave and heat in short intervals (usually around 30 seconds at a time) until it reaches the desired warmth.  Important considerations: Check temperature before applying: Always test the temperature of the rice sock before applying it to your skin to avoid burns.  Use a towel to protect skin: Wrap the rice sock in a thin towel to distribute the heat evenly and protect your skin.  Uses: Muscle aches and pains  Menstrual cramps  Neck pain  Arthritis discomfort   Elbow sleeve brace   BLUE EMU CREAM: Use it 2-3 times a day on painful areas

## 2023-09-14 NOTE — Assessment & Plan Note (Signed)
  ASA, Micardis  HCT, Pravastatin  Heart Cath is pending - Dr Verlin s/p TAVR 07/2023

## 2023-09-15 ENCOUNTER — Ambulatory Visit: Payer: Self-pay | Admitting: Internal Medicine

## 2023-09-15 ENCOUNTER — Ambulatory Visit: Admitting: Physician Assistant

## 2023-09-15 ENCOUNTER — Ambulatory Visit
Admission: RE | Admit: 2023-09-15 | Discharge: 2023-09-15 | Disposition: A | Discharge status: Home / Self Care | Source: Ambulatory Visit | Attending: Cardiology | Admitting: Cardiology

## 2023-09-15 VITALS — BP 124/74 | HR 84 | Ht 69.0 in | Wt 203.3 lb

## 2023-09-15 DIAGNOSIS — M109 Gout, unspecified: Secondary | ICD-10-CM | POA: Diagnosis not present

## 2023-09-15 DIAGNOSIS — I5022 Chronic systolic (congestive) heart failure: Secondary | ICD-10-CM

## 2023-09-15 DIAGNOSIS — I5032 Chronic diastolic (congestive) heart failure: Secondary | ICD-10-CM

## 2023-09-15 DIAGNOSIS — I4729 Other ventricular tachycardia: Secondary | ICD-10-CM | POA: Diagnosis not present

## 2023-09-15 DIAGNOSIS — I10 Essential (primary) hypertension: Secondary | ICD-10-CM | POA: Insufficient documentation

## 2023-09-15 DIAGNOSIS — N1832 Chronic kidney disease, stage 3b: Secondary | ICD-10-CM | POA: Insufficient documentation

## 2023-09-15 DIAGNOSIS — I4510 Unspecified right bundle-branch block: Secondary | ICD-10-CM

## 2023-09-15 DIAGNOSIS — Z952 Presence of prosthetic heart valve: Secondary | ICD-10-CM | POA: Insufficient documentation

## 2023-09-15 DIAGNOSIS — I739 Peripheral vascular disease, unspecified: Secondary | ICD-10-CM | POA: Diagnosis not present

## 2023-09-15 DIAGNOSIS — Z955 Presence of coronary angioplasty implant and graft: Secondary | ICD-10-CM | POA: Diagnosis not present

## 2023-09-15 DIAGNOSIS — E785 Hyperlipidemia, unspecified: Secondary | ICD-10-CM | POA: Diagnosis not present

## 2023-09-15 DIAGNOSIS — I214 Non-ST elevation (NSTEMI) myocardial infarction: Secondary | ICD-10-CM | POA: Diagnosis not present

## 2023-09-15 DIAGNOSIS — R918 Other nonspecific abnormal finding of lung field: Secondary | ICD-10-CM

## 2023-09-15 DIAGNOSIS — I2510 Atherosclerotic heart disease of native coronary artery without angina pectoris: Secondary | ICD-10-CM

## 2023-09-15 LAB — ECHOCARDIOGRAM COMPLETE
AR max vel: 0.83 cm2
AV Area VTI: 0.93 cm2
AV Area mean vel: 0.92 cm2
AV Mean grad: 12 mmHg
AV Peak grad: 20.1 mmHg
Ao pk vel: 2.24 m/s
Area-P 1/2: 2.53 cm2
S' Lateral: 3.5 cm

## 2023-09-15 MED ORDER — ASPIRIN 81 MG PO TBEC: 81.0000 mg | DELAYED_RELEASE_TABLET | Freq: Every day | ORAL | Status: AC

## 2023-09-15 MED ORDER — ALLOPURINOL 100 MG PO TABS
50.0000 mg | ORAL_TABLET | Freq: Every day | ORAL | 3 refills | Status: AC
Start: 2023-09-15 — End: ?

## 2023-09-15 NOTE — Patient Instructions (Signed)
 Medication Instructions:  Your physician has recommended you make the following change in your medication: Take 1 aspirin  81mg  tablet daily instead of 2 tablets. *If you need a refill on your cardiac medications before your next appointment, please call your pharmacy*  Lab Work: None needed If you have labs (blood work) drawn today and your tests are completely normal, you will receive your results only by: MyChart Message (if you have MyChart) OR A paper copy in the mail If you have any lab test that is abnormal or we need to change your treatment, we will call you to review the results.  Testing/Procedures: CT-scan of the chest on 10/06/23 at [address]   Follow-Up: At [facility], you and your health needs are our priority.  As part of our continuing mission to provide you with exceptional heart care, our providers are all part of one team.  This team includes your primary Cardiologist (physician) and Advanced Practice Providers or APPs (Physician Assistants and Nurse Practitioners) who all work together to provide you with the care you need, when you need it.  Your next appointment:   As scheduled on 12/30/23 at 3:40PM  Provider:   Dr. Medford Cash  We recommend signing up for the patient portal called MyChart.  Sign up information is provided on this After Visit Summary.  MyChart is used to connect with patients for Virtual Visits (Telemedicine).  Patients are able to view lab/test results, encounter notes, upcoming appointments, etc.  Non-urgent messages can be sent to your provider as well.   To learn more about what you can do with MyChart, go to [email or web address].

## 2023-09-15 NOTE — Progress Notes (Unsigned)
 [facility]   MULTIDISCIPLINARY HEART VALVE CLINIC                                     Cardiology Office Note:    Date:  09/16/2023   ID:  Jeff Burgess, DOB [birth year], MRN [id number]  PCP:  Jeff Karlynn GAILS, MD  [facility] Cardiologist:  Jeff Cash, MD  [facility] Structural heart: Jeff Fell, MD [facility] Electrophysiologist:  None   Referring MD: Jeff Karlynn GAILS, MD   1 month s/p TAVR  History of Present Illness:    Jeff Burgess is a 88 y.o. male with a hx of CAD s/p 1V CABG in 1998 s/p DES to [facility] (06/17/23), carotid artery disease, PAD, HTN, HLD, CKD stage IIIb, DMT2, HFmrEF, bladder cancer s/p TURBT in 2017, chronic RBBB and severe AS s/p TAVR (08/09/23) who presents to clinic for follow up.   His most recent echocardiogram on 02/23/2023 showed EF 50-55%, LFLG severe AS with mean gradient 25 mmHg, AVA 0.83 cm2, DI 0.26, SVI 33. Cardiac catheterization 06/17/2023 showed chronic occlusion of the ostial LAD with a patent LIMA to the LAD filling the entire vessel. There was a large left circumflex with severe mid vessel stenosis successfully treated with PTCA and DES x 1. The RCA had 50% proximal stenosis and 70% PDA stenosis. He had some symptom improvement after PCI but continued to have significant exertional fatigue and shortness of breath even with walking short distances as well as lower extremity edema and orthopnea. He was started on diuretic therapy. S/p successful TAVR with a 23 mm Edwards Sapien 3 Ultra Resilia THV via the TF approach on 08/09/23. Post operative echo showed EF 60%, normally functioning TAVR with a mean gradient of 13 mmHg and no PVL. Hospital course complivated by ~102F fever, leukocytosis, elevated inflammatory markers and new elbow pain/swelling. Seen by ortho and elbow aspiration c/w gout. He was discharged on oral steroids. Given an underlying RBBB and new 1st deg AV block, he was discharged with a Zio AT which  did not show HAVB but did show frequent ventricular ectopy and he was started on Toprol  XL 12.5mg  daily. Given dizziness and low BPs, amlodipine  5 mg daily was discontinued and Lasix  40 mg daily changed to PRN.  Today the patient presents to clinic for follow up. Here with his wife. He is working with cardiac rehab and having no issues. No chest pain. Had some aching in right buttocks with biking at cardiac rehab. No CP or SOB. No LE edema, orthopnea or PND. Some mild positional dizziness but improved from previous. No syncope. No blood in stool or urine. No palpitations.     Past Medical History:  Diagnosis Date   Benign localized prostatic hyperplasia with lower urinary tract symptoms (LUTS)    Bladder cancer [facility]) dx 05/ 2017--  urologist- dr chauncey   TCC , Ta of bladder  s/p TURBT 06-02-2015   CAD (coronary artery disease)    no cardiologist--  followed by pcp,  dr plontnikov   Chronic low back pain with sciatica    neuropathy feet   CKD stage 3b, GFR 30-44 ml/min (HCC)    Diabetes mellitus, type 2 (HCC)    ED (erectile dysfunction)    Full dentures    Heart failure with mildly reduced ejection fraction (HFmrEF) (HCC)    History of kidney stones  Hyperlipidemia    Hypertension    Hypogonadism male    OA (osteoarthritis)    PAD (peripheral artery disease) (HCC)    Right bundle branch block (RBBB)    S/P CABG x 1    1998   S/P TAVR (transcatheter aortic valve replacement) 08/09/2023   s/p TAVR with a 23 mm Edwards Sapien 3 Ultra Resilia THV via the TF approach by Dr. Wonda and Dr. Lucas   Severe aortic stenosis    Sigmoid diverticulosis    Ventral hernia    midline   Wears glasses      Current Medications: Current Meds  Medication Sig   allopurinol  (ZYLOPRIM ) 100 MG tablet Take 0.5 tablets (50 mg total) by mouth daily.   aspirin  EC 81 MG tablet Take 1 tablet (81 mg total) by mouth daily. Swallow whole.   b complex vitamins capsule Take 1 capsule by mouth daily.    Blood Glucose Monitoring Suppl DEVI 1 each by Does not apply route in the morning, at noon, and at bedtime. May substitute to any manufacturer covered by patient's insurance.   cholecalciferol  (VITAMIN D ) 25 MCG (1000 UT) tablet Take 1 tablet (1,000 Units total) by mouth every morning. Follow-up due in April must see provider for refills   CINNAMON PO Take 3 capsules by mouth daily. OTC cinnamon 1000mg  capsules.   clopidogrel  (PLAVIX ) 75 MG tablet Take 1 tablet (75 mg total) by mouth daily with breakfast.   furosemide  (LASIX ) 40 MG tablet Take 1 tablet (40 mg total) by mouth as needed.   gabapentin  (NEURONTIN ) 300 MG capsule Take 300-600 mg by mouth daily as needed (nerve pain).   Glucose Blood (BLOOD GLUCOSE TEST STRIPS) STRP 1 each by In Vitro route in the morning, at noon, and at bedtime. May substitute to any manufacturer covered by patient's insurance.   HYDROcodone -acetaminophen  (NORCO/VICODIN) 5-325 MG tablet Take 1 tablet by mouth every 6 (six) hours as needed (pain).   isosorbide  mononitrate (IMDUR ) 30 MG 24 hr tablet TAKE 1 TABLET BY MOUTH DAILY   Lancet Device MISC 1 each by Does not apply route in the morning, at noon, and at bedtime. May substitute to any manufacturer covered by patient's insurance.   Lancets Misc. MISC 1 each by Does not apply route in the morning, at noon, and at bedtime. May substitute to any manufacturer covered by patient's insurance.   metFORMIN  (GLUCOPHAGE -XR) 500 MG 24 hr tablet TAKE 1 TABLET BY MOUTH TWICE  DAILY   methylPREDNISolone  (MEDROL  DOSEPAK) 4 MG TBPK tablet As directed   metoprolol  succinate (TOPROL  XL) 25 MG 24 hr tablet Take 0.5 tablets (12.5 mg total) by mouth daily.   Multiple Vitamins-Minerals (MENS 50+ MULTIVITAMIN) TABS Take 1 tablet by mouth daily.   nitroGLYCERIN  (NITROSTAT ) 0.4 MG SL tablet Place 1 tablet (0.4 mg total) under the tongue every 5 (five) minutes as needed.   repaglinide  (PRANDIN ) 1 MG tablet TAKE 1 TABLET (1 MG TOTAL) BY MOUTH 3  (THREE) TIMES DAILY BEFORE MEALS.   telmisartan -hydrochlorothiazide  (MICARDIS  HCT) 80-25 MG tablet TAKE 1 TABLET BY MOUTH DAILY   TYLENOL  500 MG tablet Take 500-1,000 mg by mouth 2 (two) times daily as needed for moderate pain (pain score 4-6), fever or headache.   ZYPITAMAG  4 MG TABS TAKE 1 TABLET BY MOUTH EVERY DAY   [DISCONTINUED] aspirin  81 MG tablet Take 2 tablets (162 mg total) by mouth daily.      ROS:   Please see the history of present illness.  All other systems reviewed and are negative.  EKGs   EKG Interpretation Date/Time:  Thursday August 18 2023 14:15:24 EDT Ventricular Rate:  103 PR Interval:    QRS Duration:  138 QT Interval:  372 QTC Calculation: 487 R Axis:   -56  Text Interpretation: Sinus with PACs and with premature ventricular or aberrantly conducted complexes Left axis deviation Right bundle branch block Confirmed by Sebastian Collar [phone]) on 08/19/2023 8:50:30 AM   Risk Assessment/Calculations:           Physical Exam:    VS:  BP 124/74   Pulse 84   Ht 5' 9 (1.753 m)   Wt 203 lb 4.8 oz (92.2 kg)   SpO2 95%   BMI 30.02 kg/m     Wt Readings from Last 3 Encounters:  09/15/23 203 lb 4.8 oz (92.2 kg)  08/18/23 198 lb 12.8 oz (90.2 kg)  08/10/23 200 lb 9.6 oz (91 kg)     GEN: Well nourished, well developed in no acute distress NECK: No JVD CARDIAC: RRR, no murmurs, rubs, gallops RESPIRATORY:  Clear to auscultation without rales, wheezing or rhonchi  ABDOMEN: Soft, non-tender, non-distended EXTREMITIES:  No edema; No deformity.   ASSESSMENT:    1. S/P TAVR (transcatheter aortic valve replacement)   2. Acute gout of elbow, unspecified cause, unspecified laterality   3. RBBB   4. NSVT (nonsustained ventricular tachycardia) (HCC)   5. Coronary artery disease involving native coronary artery of native heart without angina pectoris   6. PAD (peripheral artery disease) (HCC)   7. Primary hypertension   8. Heart failure with improved  ejection fraction (HFimpEF) (HCC)   9. Hyperlipidemia, unspecified hyperlipidemia type   10. CKD stage 3b, GFR 30-44 ml/min (HCC)   11. Lung mass      PLAN:    In order of problems listed above:  Severe AS s/p TAVR:  -- Echo today shows EF 55%, normally functioning TAVR with a mean gradient of 12 mm hg and no PVL.  -- NYHA class II symptoms. -- SBE prophylaxis discussed; the patient is edentulous and does not go to the dentist.  -- Continue DAPT with ASA and Plavix  with recent PCI. ( was taking 162 mg of aspirin --> decreased to 81mg  daily). -- Working with cardiac rehab in [place]  -- I will see back for 1 year echo and OV.   Acute gout: -- Improved after steroids.  -- Seen by PCP recently and uric acid elevated, started on allopurinol .    RBBB/NSVT: -- Zio AT with no HAVB, but showed frequent ventricular ectopy/NSVT -- Started on Toprol  XL 12.5 mg daily.    CAD:  -- S/p remote CABG in 1998. -- Patient underwent cardiac catheterization on 06/16/2023 with chronic lesion of ostial LAD, patent LIMA to LAD, 90% M LCx treated with PCI/DES x 1.   -- Continue DAPT with ASA and Plavix  for at least 6 months (12/2023).  -- Continue pitavsatain 4mg  daily.    PAD: -- Lower extremity Dopplers in 2019 showed total occlusion of the right posterior tibial artery and left anterior tibial artery.  -- ABI 1.12 on the right and 0.95 on the left.   -- Seen by vascular surgery who felt symptoms related to pseudoclaudication from back issues.  -- Continue antiplatelets and high intensity statin.   HTN:  -- At last office visit he was having dizziness and low BPs so amlodipine  5 mg was discontinued and Lasix  40 mg daily changed to PRN. --  BP 124/74 today and dizziness improved.  -- Continue Toprol  XL 12.5mg , Imdur  30 mg daily and telmisartan -hydrochlorothiazide  80-25 mg daily.    HFimpEF: -- EF 55%.  -- Appears euvolemic.  -- Continue telmisartan -HCT 80-25 mg daily.  -- Continue Lasix  40 mg  PRN. -- Continue Toprol  XL 12.5mg  daily for ectopy.    HLD: -- Continue pitavastatin  4 mg daily.    CKD stage IIIb:  -- Creat baseline 1.46-1.79. -- Creat 1.66 on labs done yesterday by PCP (personally reviewed).    Abnormal CT chest: -- Pre TAVR CTs showed slightly more prominent and interval increase in size of a right peribronchovascular 1.4 x 1 cm (from 1.1 by 1.1 cm) soft tissue density. Recommend follow-up CT with intravenous contrast in 3 months to further evaluate stability. Additional imaging evaluation or consultation with Pulmonology or Thoracic Surgery recommended. Persistent but nonenlarged right hilar, left hilar, and mediastinal lymph nodes. Recommend attention on follow-up. -- CT with contrast scheduled for 10/06/23.   Medication Adjustments/Labs and Tests Ordered: Current medicines are reviewed at length with the patient today.  Concerns regarding medicines are outlined above.  Orders Placed This Encounter  Procedures   ECHOCARDIOGRAM COMPLETE   Meds ordered this encounter  Medications   aspirin  EC 81 MG tablet    Sig: Take 1 tablet (81 mg total) by mouth daily. Swallow whole.    Patient Instructions  Medication Instructions:  Your physician has recommended you make the following change in your medication: Take 1 aspirin  81mg  tablet daily instead of 2 tablets. *If you need a refill on your cardiac medications before your next appointment, please call your pharmacy*  Lab Work: None needed If you have labs (blood work) drawn today and your tests are completely normal, you will receive your results only by: MyChart Message (if you have MyChart) OR A paper copy in the mail If you have any lab test that is abnormal or we need to change your treatment, we will call you to review the results.  Testing/Procedures: CT-scan of the chest on 10/06/23 at [address]   Follow-Up: At [facility], you and your health needs are our priority.  As part of  our continuing mission to provide you with exceptional heart care, our providers are all part of one team.  This team includes your primary Cardiologist (physician) and Advanced Practice Providers or APPs (Physician Assistants and Nurse Practitioners) who all work together to provide you with the care you need, when you need it.  Your next appointment:   As scheduled on 12/30/23 at 3:40PM  Provider:   Dr. Medford Burgess  We recommend signing up for the patient portal called MyChart.  Sign up information is provided on this After Visit Summary.  MyChart is used to connect with patients for Virtual Visits (Telemedicine).  Patients are able to view lab/test results, encounter notes, upcoming appointments, etc.  Non-urgent messages can be sent to your provider as well.   To learn more about what you can do with MyChart, go to [email or web address].        Signed, Lamarr Hummer, PA-C  09/16/2023 10:20 AM    [facility]

## 2023-09-16 ENCOUNTER — Ambulatory Visit: Payer: Self-pay | Admitting: Physician Assistant

## 2023-09-19 DIAGNOSIS — I214 Non-ST elevation (NSTEMI) myocardial infarction: Secondary | ICD-10-CM | POA: Diagnosis not present

## 2023-09-19 DIAGNOSIS — Z955 Presence of coronary angioplasty implant and graft: Secondary | ICD-10-CM | POA: Diagnosis not present

## 2023-09-21 DIAGNOSIS — Z955 Presence of coronary angioplasty implant and graft: Secondary | ICD-10-CM | POA: Diagnosis not present

## 2023-09-21 DIAGNOSIS — I214 Non-ST elevation (NSTEMI) myocardial infarction: Secondary | ICD-10-CM | POA: Diagnosis not present

## 2023-09-26 DIAGNOSIS — Z955 Presence of coronary angioplasty implant and graft: Secondary | ICD-10-CM | POA: Diagnosis not present

## 2023-09-26 DIAGNOSIS — I214 Non-ST elevation (NSTEMI) myocardial infarction: Secondary | ICD-10-CM | POA: Diagnosis not present

## 2023-09-28 DIAGNOSIS — Z955 Presence of coronary angioplasty implant and graft: Secondary | ICD-10-CM | POA: Diagnosis not present

## 2023-09-28 DIAGNOSIS — I214 Non-ST elevation (NSTEMI) myocardial infarction: Secondary | ICD-10-CM | POA: Diagnosis not present

## 2023-09-29 DIAGNOSIS — I214 Non-ST elevation (NSTEMI) myocardial infarction: Secondary | ICD-10-CM | POA: Diagnosis not present

## 2023-09-29 DIAGNOSIS — Z955 Presence of coronary angioplasty implant and graft: Secondary | ICD-10-CM | POA: Diagnosis not present

## 2023-10-03 DIAGNOSIS — I214 Non-ST elevation (NSTEMI) myocardial infarction: Secondary | ICD-10-CM | POA: Diagnosis not present

## 2023-10-03 DIAGNOSIS — Z955 Presence of coronary angioplasty implant and graft: Secondary | ICD-10-CM | POA: Diagnosis not present

## 2023-10-05 DIAGNOSIS — Z955 Presence of coronary angioplasty implant and graft: Secondary | ICD-10-CM | POA: Diagnosis not present

## 2023-10-05 DIAGNOSIS — I214 Non-ST elevation (NSTEMI) myocardial infarction: Secondary | ICD-10-CM | POA: Diagnosis not present

## 2023-10-06 ENCOUNTER — Ambulatory Visit (HOSPITAL_COMMUNITY)

## 2023-10-10 ENCOUNTER — Ambulatory Visit: Payer: Self-pay

## 2023-10-10 NOTE — Telephone Encounter (Signed)
 FYI Only or Action Required?: Action required by provider: medication refill request.  Patient was last seen in primary care on 09/14/2023 by Plotnikov, Karlynn GAILS, MD.  Called Nurse Triage reporting Foot Swelling and Foot Pain.  Symptoms began several days ago.  Interventions attempted: OTC medications: Tylenol ; allopurinol .  Symptoms are: left great toe red,swollen, painful gradually worsening.  Triage Disposition: See Physician Within 24 Hours  Patient/caregiver understands and will follow disposition?: No, wishes to speak with PCP      Copied from CRM #[id number]. Topic: Clinical - Red Word Triage >> Oct 10, 2023  9:07 AM Tonda B wrote: [facility] that prompted transfer to Nurse Triage: patient is calling in with gout in left foot unable to walk Reason for Disposition  [1] Redness of the skin AND [2] no fever  Answer Assessment - Initial Assessment Questions Patient states he has been able to walk around his home with a walker but does not think he would be able to come in for an office visit. Patient states he is willing to do a virtual visit. He states he would prefer to hold off on an appointment and ask if Dr Garald would send in the methylprednisolone  again for what he thinks is a gout flare up instead.  1. ONSET: When did the pain start?      Saturday.  2. LOCATION: Where is the pain located?      Left foot (great toe and ball of foot)  3. PAIN: How bad is the pain?    (Scale 1-10; or mild, moderate, severe)     8-9/10.  4. WORK OR EXERCISE: Has there been any recent work or exercise that involved this part of the body?      He states he has been going to rehab, walking on the treadmill but he does not think that is related.  5. CAUSE: What do you think is causing the foot pain?     Gout.  6. OTHER SYMPTOMS: Do you have any other symptoms? (e.g., leg pain, rash, fever, numbness)     Redness, swelling at left great toe. Denies rash, fever.  7.  PREGNANCY: Is there any chance you are pregnant? When was your last menstrual period?     N/A.  Protocols used: Foot Pain-A-AH

## 2023-10-11 ENCOUNTER — Telehealth: Payer: Self-pay | Admitting: Radiology

## 2023-10-11 ENCOUNTER — Ambulatory Visit: Payer: Self-pay

## 2023-10-11 NOTE — Telephone Encounter (Signed)
 FYI Only or Action Required?: Action required by provider: follow up on Triage call 9/29. Return call to patient when addressed  Patient was last seen in primary care on 09/14/2023 by Plotnikov, Karlynn GAILS, MD.  Called Nurse Triage reporting Release of Information.  Symptoms began yesterday.  Interventions attempted: Other: not assessed this call.  Symptoms are: unchanged.  Triage Disposition: Information or Advice Only Call  Patient/caregiver understands and will follow disposition?: Yes Copied from CRM #[id number]. Topic: Clinical - Red Word Triage >> Oct 11, 2023 12:07 PM [place] J wrote: [facility] that prompted transfer to Nurse Triage: Patient is having a gout flare up that's very painful. He is needing methylPREDNISolone  (MEDROL  DOSEPAK) 4 MG TBPK tablet. Reason for Disposition  [1] Follow-up call to recent contact AND [2] information only call, no triage required  Answer Assessment - Initial Assessment Questions 1. REASON FOR CALL: What is the main reason for your call? or How can I best help you?     Calling to check on prescription request for Medrol  Dosepak refill for Gout Flare. Patient symptoms have not improved but they have not got any worse. Triage by previous RN 9/29  Protocols used: Information Only Call - No Triage-A-AH

## 2023-10-11 NOTE — Telephone Encounter (Signed)
 Copied from CRM #[id number]. Topic: Clinical - Medication Question >> Oct 11, 2023  9:06 AM Thersia BROCKS wrote: Reason for RMF:Ejupzwu called regarding his mediation methylPREDNISolone  (MEDROL  DOSEPAK) 4 MG TBPK tablet , would like a followup regarding this

## 2023-10-12 ENCOUNTER — Other Ambulatory Visit: Payer: Self-pay | Admitting: Internal Medicine

## 2023-10-12 ENCOUNTER — Ambulatory Visit (HOSPITAL_COMMUNITY)
Admission: RE | Admit: 2023-10-12 | Discharge: 2023-10-12 | Disposition: A | Discharge status: Home / Self Care | Source: Ambulatory Visit | Attending: Physician Assistant | Admitting: Physician Assistant

## 2023-10-12 DIAGNOSIS — R918 Other nonspecific abnormal finding of lung field: Secondary | ICD-10-CM | POA: Insufficient documentation

## 2023-10-12 DIAGNOSIS — J929 Pleural plaque without asbestos: Secondary | ICD-10-CM | POA: Diagnosis not present

## 2023-10-12 MED ORDER — IOHEXOL 350 MG/ML SOLN
75.0000 mL | Freq: Once | INTRAVENOUS | Status: AC | PRN
Start: 2023-10-12 — End: 2023-10-12
  Administered 2023-10-12: 75 mL via INTRAVENOUS

## 2023-10-12 NOTE — Telephone Encounter (Signed)
 Copied from CRM [id number]. Topic: Clinical - Medication Refill >> Oct 12, 2023 10:56 AM Suzen RAMAN wrote: Medication: Methylprednisone   Has the patient contacted their pharmacy? Yes   This is the patient's preferred pharmacy:  [id number] - [place], [place] - [address] AT [facility] [address] [place] [address] Phone: [phone] Fax: [phone]   Is this the correct pharmacy for this prescription? Yes If no, delete pharmacy and type the correct one.   Has the prescription been filled recently? No  Is the patient out of the medication? Yes  Has the patient been seen for an appointment in the last year OR does the patient have an upcoming appointment? Yes  Can we respond through MyChart? Yes  Agent: Please be advised that Rx refills may take up to 3 business days. We ask that you follow-up with your pharmacy.  **4th attempt to have medication refilled**

## 2023-10-13 ENCOUNTER — Ambulatory Visit: Payer: Self-pay | Admitting: Physician Assistant

## 2023-10-13 MED ORDER — METHYLPREDNISOLONE 4 MG PO TBPK: ORAL_TABLET | ORAL | 0 refills | Status: DC

## 2023-10-17 DIAGNOSIS — I214 Non-ST elevation (NSTEMI) myocardial infarction: Secondary | ICD-10-CM | POA: Diagnosis not present

## 2023-10-17 DIAGNOSIS — Z955 Presence of coronary angioplasty implant and graft: Secondary | ICD-10-CM | POA: Diagnosis not present

## 2023-10-17 NOTE — Telephone Encounter (Signed)
Okay to reorder.  Thanks.

## 2023-10-19 DIAGNOSIS — Z955 Presence of coronary angioplasty implant and graft: Secondary | ICD-10-CM | POA: Diagnosis not present

## 2023-10-19 DIAGNOSIS — I214 Non-ST elevation (NSTEMI) myocardial infarction: Secondary | ICD-10-CM | POA: Diagnosis not present

## 2023-10-19 NOTE — Telephone Encounter (Signed)
 Medication sent in 10/02

## 2023-10-20 DIAGNOSIS — I214 Non-ST elevation (NSTEMI) myocardial infarction: Secondary | ICD-10-CM | POA: Diagnosis not present

## 2023-10-20 DIAGNOSIS — Z955 Presence of coronary angioplasty implant and graft: Secondary | ICD-10-CM | POA: Diagnosis not present

## 2023-10-21 NOTE — Telephone Encounter (Signed)
 Medication sent 10/13/2023

## 2023-10-24 DIAGNOSIS — I214 Non-ST elevation (NSTEMI) myocardial infarction: Secondary | ICD-10-CM | POA: Diagnosis not present

## 2023-10-24 DIAGNOSIS — Z955 Presence of coronary angioplasty implant and graft: Secondary | ICD-10-CM | POA: Diagnosis not present

## 2023-10-26 DIAGNOSIS — Z955 Presence of coronary angioplasty implant and graft: Secondary | ICD-10-CM | POA: Diagnosis not present

## 2023-10-26 DIAGNOSIS — I214 Non-ST elevation (NSTEMI) myocardial infarction: Secondary | ICD-10-CM | POA: Diagnosis not present

## 2023-10-27 DIAGNOSIS — I214 Non-ST elevation (NSTEMI) myocardial infarction: Secondary | ICD-10-CM | POA: Diagnosis not present

## 2023-10-27 DIAGNOSIS — Z955 Presence of coronary angioplasty implant and graft: Secondary | ICD-10-CM | POA: Diagnosis not present

## 2023-10-31 DIAGNOSIS — Z955 Presence of coronary angioplasty implant and graft: Secondary | ICD-10-CM | POA: Diagnosis not present

## 2023-10-31 DIAGNOSIS — I214 Non-ST elevation (NSTEMI) myocardial infarction: Secondary | ICD-10-CM | POA: Diagnosis not present

## 2023-11-02 DIAGNOSIS — Z955 Presence of coronary angioplasty implant and graft: Secondary | ICD-10-CM | POA: Diagnosis not present

## 2023-11-02 DIAGNOSIS — I214 Non-ST elevation (NSTEMI) myocardial infarction: Secondary | ICD-10-CM | POA: Diagnosis not present

## 2023-11-03 DIAGNOSIS — I214 Non-ST elevation (NSTEMI) myocardial infarction: Secondary | ICD-10-CM | POA: Diagnosis not present

## 2023-11-03 DIAGNOSIS — Z955 Presence of coronary angioplasty implant and graft: Secondary | ICD-10-CM | POA: Diagnosis not present

## 2023-11-15 ENCOUNTER — Ambulatory Visit (INDEPENDENT_AMBULATORY_CARE_PROVIDER_SITE_OTHER): Admitting: Internal Medicine

## 2023-11-15 ENCOUNTER — Encounter: Payer: Self-pay | Admitting: Internal Medicine

## 2023-11-15 VITALS — BP 128/62 | HR 75 | Temp 98.0°F | Ht 69.0 in | Wt 205.0 lb

## 2023-11-15 DIAGNOSIS — E1169 Type 2 diabetes mellitus with other specified complication: Secondary | ICD-10-CM

## 2023-11-15 DIAGNOSIS — Z952 Presence of prosthetic heart valve: Secondary | ICD-10-CM

## 2023-11-15 DIAGNOSIS — N1832 Chronic kidney disease, stage 3b: Secondary | ICD-10-CM | POA: Diagnosis not present

## 2023-11-15 DIAGNOSIS — I5020 Unspecified systolic (congestive) heart failure: Secondary | ICD-10-CM

## 2023-11-15 DIAGNOSIS — E1122 Type 2 diabetes mellitus with diabetic chronic kidney disease: Secondary | ICD-10-CM

## 2023-11-15 DIAGNOSIS — M109 Gout, unspecified: Secondary | ICD-10-CM | POA: Insufficient documentation

## 2023-11-15 DIAGNOSIS — M1A0720 Idiopathic chronic gout, left ankle and foot, without tophus (tophi): Secondary | ICD-10-CM | POA: Diagnosis not present

## 2023-11-15 MED ORDER — COLCHICINE 0.6 MG PO TABS: ORAL_TABLET | ORAL | 1 refills | Status: DC

## 2023-11-15 MED ORDER — METHYLPREDNISOLONE 4 MG PO TBPK
ORAL_TABLET | ORAL | 0 refills | Status: AC
Start: 2023-11-15 — End: ?

## 2023-11-15 MED ORDER — HYDROCODONE-ACETAMINOPHEN 5-325 MG PO TABS: 1.0000 | ORAL_TABLET | ORAL | 0 refills | Status: AC | PRN

## 2023-11-15 NOTE — Assessment & Plan Note (Signed)
 On Toprol , Furosemide , Micardis  HCT

## 2023-11-15 NOTE — Assessment & Plan Note (Signed)
 On Allopurinol  Given Colchicine, Norco, Medrol  pack prn

## 2023-11-15 NOTE — Progress Notes (Addendum)
 Subjective:  Patient ID: Jeff Burgess, male    DOB: Jul 15, 1935  Age: 88 y.o. MRN: [id number]  CC: Medical Management of Chronic Issues (2 Month follow up)   HPI Jeff Burgess presents for 2 gout episodes, the second 1 was in his left foot C/o LBP, R shoulder pain F/u DM, gait disorder He is here with his wife Or[place] Medications Prior to Visit  Medication Sig Dispense Refill   allopurinol  (ZYLOPRIM ) 100 MG tablet Take 0.5 tablets (50 mg total) by mouth daily. 45 tablet 3   aspirin  EC 81 MG tablet Take 1 tablet (81 mg total) by mouth daily. Swallow whole.     b complex vitamins capsule Take 1 capsule by mouth daily.     Blood Glucose Monitoring Suppl DEVI 1 each by Does not apply route in the morning, at noon, and at bedtime. May substitute to any manufacturer covered by patient's insurance. 1 each 0   cholecalciferol  (VITAMIN D ) 25 MCG (1000 UT) tablet Take 1 tablet (1,000 Units total) by mouth every morning. Follow-up due in April must see provider for refills 90 tablet 0   CINNAMON PO Take 3 capsules by mouth daily. OTC cinnamon 1000mg  capsules.     clopidogrel  (PLAVIX ) 75 MG tablet Take 1 tablet (75 mg total) by mouth daily with breakfast. 90 tablet 2   furosemide  (LASIX ) 40 MG tablet Take 1 tablet (40 mg total) by mouth as needed.     gabapentin  (NEURONTIN ) 300 MG capsule Take 300-600 mg by mouth daily as needed (nerve pain).     isosorbide  mononitrate (IMDUR ) 30 MG 24 hr tablet TAKE 1 TABLET BY MOUTH DAILY 100 tablet 2   metFORMIN  (GLUCOPHAGE -XR) 500 MG 24 hr tablet TAKE 1 TABLET BY MOUTH TWICE  DAILY 200 tablet 2   metoprolol  succinate (TOPROL  XL) 25 MG 24 hr tablet Take 0.5 tablets (12.5 mg total) by mouth daily. 45 tablet 3   Multiple Vitamins-Minerals (MENS 50+ MULTIVITAMIN) TABS Take 1 tablet by mouth daily.     nitroGLYCERIN  (NITROSTAT ) 0.4 MG SL tablet Place 1 tablet (0.4 mg total) under the tongue every 5 (five) minutes as needed. 25 tablet 2   repaglinide   (PRANDIN ) 1 MG tablet TAKE 1 TABLET (1 MG TOTAL) BY MOUTH 3 (THREE) TIMES DAILY BEFORE MEALS. 300 tablet 3   telmisartan -hydrochlorothiazide  (MICARDIS  HCT) 80-25 MG tablet TAKE 1 TABLET BY MOUTH DAILY 90 tablet 3   TYLENOL  500 MG tablet Take 500-1,000 mg by mouth 2 (two) times daily as needed for moderate pain (pain score 4-6), fever or headache.     ZYPITAMAG  4 MG TABS TAKE 1 TABLET BY MOUTH EVERY DAY 90 tablet 3   HYDROcodone -acetaminophen  (NORCO/VICODIN) 5-325 MG tablet Take 1 tablet by mouth every 6 (six) hours as needed (pain).     methylPREDNISolone  (MEDROL  DOSEPAK) 4 MG TBPK tablet As directed 21 tablet 0   No facility-administered medications prior to visit.    ROS: Review of Systems  Constitutional:  Positive for fatigue. Negative for appetite change and unexpected weight change.  HENT:  Negative for congestion, nosebleeds, sneezing, sore throat and trouble swallowing.   Eyes:  Negative for itching and visual disturbance.  Respiratory:  Negative for cough.   Cardiovascular:  Negative for chest pain, palpitations and leg swelling.  Gastrointestinal:  Negative for abdominal distention, blood in stool, diarrhea and nausea.  Genitourinary:  Negative for frequency and hematuria.  Musculoskeletal:  Positive for arthralgias, back pain, gait problem and joint swelling. Negative  for neck pain.  Skin:  Negative for rash.  Neurological:  Negative for dizziness, tremors, speech difficulty and weakness.  Hematological:  Does not bruise/bleed easily.  Psychiatric/Behavioral:  Negative for agitation, dysphoric mood and sleep disturbance. The patient is not nervous/anxious.     Objective:  BP 128/62   Pulse 75   Temp 98 F (36.7 C)   Ht 5' 9 (1.753 m)   Wt 205 lb (93 kg)   SpO2 99%   BMI 30.27 kg/m   BP Readings from Last 3 Encounters:  11/15/23 128/62  09/15/23 124/74  09/14/23 (!) 150/60    Wt Readings from Last 3 Encounters:  11/15/23 205 lb (93 kg)  09/15/23 203 lb 4.8 oz  (92.2 kg)  08/18/23 198 lb 12.8 oz (90.2 kg)    Physical Exam Constitutional:      General: He is not in acute distress.    Appearance: He is well-developed. He is obese.     Comments: NAD  Eyes:     Conjunctiva/sclera: Conjunctivae normal.     Pupils: Pupils are equal, round, and reactive to light.  Neck:     Thyroid : No thyromegaly.     Vascular: No JVD.  Cardiovascular:     Rate and Rhythm: Normal rate and regular rhythm.     Heart sounds: Normal heart sounds. No murmur heard.    No friction rub. No gallop.  Pulmonary:     Effort: Pulmonary effort is normal. No respiratory distress.     Breath sounds: Normal breath sounds. No wheezing or rales.  Chest:     Chest wall: No tenderness.  Abdominal:     General: Bowel sounds are normal. There is no distension.     Palpations: Abdomen is soft. There is no mass.     Tenderness: There is no abdominal tenderness. There is no guarding or rebound.  Musculoskeletal:        General: No tenderness. Normal range of motion.     Cervical back: Normal range of motion.     Right lower leg: No edema.     Left lower leg: No edema.  Lymphadenopathy:     Cervical: No cervical adenopathy.  Skin:    General: Skin is warm and dry.     Findings: No rash.  Neurological:     Mental Status: He is alert and oriented to person, place, and time.     Cranial Nerves: No cranial nerve deficit.     Motor: No weakness or abnormal muscle tone.     Coordination: Coordination abnormal.     Gait: Gait abnormal.     Deep Tendon Reflexes: Reflexes are normal and symmetric.  Psychiatric:        Behavior: Behavior normal.        Thought Content: Thought content normal.        Judgment: Judgment normal.    Using a walker LS w/pain Obese Left shoulder with pain on range of motion   Lab Results  Component Value Date   WBC 14.3 (H) 08/10/2023   HGB 12.1 (L) 08/10/2023   HCT 35.7 (L) 08/10/2023   PLT 209 08/10/2023   GLUCOSE 117 (H) 09/14/2023   CHOL  110 09/14/2023   TRIG 173.0 (H) 09/14/2023   HDL 28.70 (L) 09/14/2023   LDLDIRECT 78.4 11/02/2011   LDLCALC 47 09/14/2023   ALT 23 09/14/2023   AST 22 09/14/2023   NA 140 09/14/2023   K 4.5 09/14/2023   CL 104 09/14/2023  CREATININE 1.66 (H) 09/14/2023   BUN 32 (H) 09/14/2023   CO2 28 09/14/2023   TSH 2.79 12/20/2022   PSA 2.67 01/22/2021   INR 1.0 08/05/2023   HGBA1C 7.5 (H) 09/14/2023    CT Chest W Contrast Result Date: 10/13/2023 EXAM: CT CHEST WITH CONTRAST 10/12/2023 02:41:22 PM TECHNIQUE: CT of the chest was performed with the administration of intravenous contrast. Multiplanar reformatted images are provided for review. Automated exposure control, iterative reconstruction, and/or weight based adjustment of the mA/kV was utilized to reduce the radiation dose to as low as reasonably achievable. COMPARISON: CTA chest 07/04/2023 and CT chest 05/19/2015. CLINICAL HISTORY: Lung nodule, < 6mm, high cancer risk; lung mass. Lung nodule; 75ml omnipaque  350. FINDINGS: MEDIASTINUM: Heart and pericardium are unremarkable. The central airways are clear. LYMPH NODES: No mediastinal, hilar or axillary lymphadenopathy. LUNGS AND PLEURA: There are bilateral calcified and soft tissue pleural plaques. There are small nodules associated with pleural plaques. Findings were not changed from recent comparison exam or remote comparison exam from 2017. No new pleural plaques or nodularity. No focal consolidation or pulmonary edema. No pleural effusion or pneumothorax. SOFT TISSUES/BONES: Midline sternotomy noted. No acute abnormality of the bones or soft tissues. UPPER ABDOMEN: Limited images of the upper abdomen demonstrates no acute abnormality. IMPRESSION: 1. Stable bilateral calcified and soft tissue pleural plaques with associated small nodules, unchanged. No new nodularity. Electronically signed by: Norleen Boxer MD 10/13/2023 12:24 PM EDT RP Workstation: [id number]    Assessment & Plan:   Problem List  Items Addressed This Visit     CKD stage 3b, GFR 30-44 ml/min (HCC)   Hydrate well      Relevant Orders   Comprehensive metabolic panel with GFR   Hemoglobin A1c   Lipid panel   TSH   DM (diabetes mellitus), type 2 with renal complications (HCC) - Primary   Cont on Prandin       Gout   On Allopurinol  Given Colchicine, Norco, Medrol  pack prn      Heart failure with mildly reduced ejection fraction (HFmrEF) (HCC)   On Toprol , Furosemide , Micardis  HCT      Relevant Orders   Comprehensive metabolic panel with GFR   Hemoglobin A1c   Lipid panel   TSH   S/P TAVR (transcatheter aortic valve replacement)   Better On Toprol , Furosemide , Micardis  HCT         Meds ordered this encounter  Medications   DISCONTD: colchicine 0.6 MG tablet    Sig: Take two tablets prn gout attack.Then take another one in 1-2 hrs. Do not repeat for 3 days.    Dispense:  18 tablet    Refill:  1   HYDROcodone -acetaminophen  (NORCO/VICODIN) 5-325 MG tablet    Sig: Take 1 tablet by mouth every 4 (four) hours as needed (pain).    Dispense:  20 tablet    Refill:  0   methylPREDNISolone  (MEDROL  DOSEPAK) 4 MG TBPK tablet    Sig: As directed for gout    Dispense:  21 tablet    Refill:  0       Follow-up: Return in about 3 months (around 02/15/2024) for a follow-up visit.  Marolyn Noel, MD

## 2023-11-15 NOTE — Patient Instructions (Signed)
 Dear Jeff Burgess, Your labs/tests are stable overall.  Your diabetic test hemoglobin A1c is better.  Your gout test called uric acid is elevated.  Please start taking allopurinol  for gout prevention 1/2 tablet a day while you are on prednisone  Dosepak. Sincerely, AP   Disclaimer: This note was dictated with voice recognition software. Similar sounding words can inadvertently be transcribed and may not be corrected upon review.

## 2023-11-15 NOTE — Assessment & Plan Note (Signed)
 Hydrate well

## 2023-11-15 NOTE — Assessment & Plan Note (Signed)
Cont on Prandin ?

## 2023-11-15 NOTE — Assessment & Plan Note (Signed)
 Better On Toprol , Furosemide , Micardis  HCT

## 2023-11-24 ENCOUNTER — Other Ambulatory Visit: Payer: Self-pay | Admitting: Internal Medicine

## 2023-11-28 ENCOUNTER — Encounter: Payer: Self-pay | Admitting: Internal Medicine

## 2023-12-30 ENCOUNTER — Ambulatory Visit: Attending: Nurse Practitioner | Admitting: Nurse Practitioner

## 2023-12-30 ENCOUNTER — Encounter: Payer: Self-pay | Admitting: Nurse Practitioner

## 2023-12-30 ENCOUNTER — Ambulatory Visit: Admitting: Cardiovascular Disease

## 2023-12-30 VITALS — BP 117/58 | HR 71 | Resp 16 | Ht 69.0 in | Wt 194.8 lb

## 2023-12-30 DIAGNOSIS — I471 Supraventricular tachycardia, unspecified: Secondary | ICD-10-CM | POA: Diagnosis not present

## 2023-12-30 DIAGNOSIS — I6523 Occlusion and stenosis of bilateral carotid arteries: Secondary | ICD-10-CM | POA: Diagnosis not present

## 2023-12-30 DIAGNOSIS — I5032 Chronic diastolic (congestive) heart failure: Secondary | ICD-10-CM

## 2023-12-30 DIAGNOSIS — I4510 Unspecified right bundle-branch block: Secondary | ICD-10-CM | POA: Diagnosis not present

## 2023-12-30 DIAGNOSIS — I10 Essential (primary) hypertension: Secondary | ICD-10-CM | POA: Diagnosis not present

## 2023-12-30 DIAGNOSIS — I739 Peripheral vascular disease, unspecified: Secondary | ICD-10-CM

## 2023-12-30 DIAGNOSIS — I350 Nonrheumatic aortic (valve) stenosis: Secondary | ICD-10-CM

## 2023-12-30 DIAGNOSIS — Z952 Presence of prosthetic heart valve: Secondary | ICD-10-CM

## 2023-12-30 DIAGNOSIS — I4729 Other ventricular tachycardia: Secondary | ICD-10-CM

## 2023-12-30 DIAGNOSIS — N1832 Chronic kidney disease, stage 3b: Secondary | ICD-10-CM

## 2023-12-30 DIAGNOSIS — E1122 Type 2 diabetes mellitus with diabetic chronic kidney disease: Secondary | ICD-10-CM

## 2023-12-30 DIAGNOSIS — E785 Hyperlipidemia, unspecified: Secondary | ICD-10-CM

## 2023-12-30 DIAGNOSIS — I2510 Atherosclerotic heart disease of native coronary artery without angina pectoris: Secondary | ICD-10-CM

## 2023-12-30 NOTE — Patient Instructions (Signed)
 Medication Instructions:  Your physician recommends that you continue on your current medications as directed. Please refer to the Current Medication list given to you today.  *If you need a refill on your cardiac medications before your next appointment, please call your pharmacy*   Follow-Up: At [facility], you and your health needs are our priority.  As part of our continuing mission to provide you with exceptional heart care, our providers are all part of one team.  This team includes your primary Cardiologist (physician) and Advanced Practice Providers or APPs (Physician Assistants and Nurse Practitioners) who all work together to provide you with the care you need, when you need it.  Your next appointment:    Keep your appointment in July  Provider:   Lamarr Hummer, PA-C  We recommend signing up for the patient portal called MyChart.  Sign up information is provided on this After Visit Summary.  MyChart is used to connect with patients for Virtual Visits (Telemedicine).  Patients are able to view lab/test results, encounter notes, upcoming appointments, etc.  Non-urgent messages can be sent to your provider as well.   To learn more about what you can do with MyChart, go to [email or web address].

## 2023-12-30 NOTE — Progress Notes (Signed)
 "  Office Visit    Patient Name: Jeff Burgess Date of Encounter: 12/30/2023  Primary Care Provider:  Garald Karlynn GAILS, MD Primary Cardiologist:  [name], MD  Chief Complaint    88 year old male with a history of CAD s/p CABG x 1 in 1998, DES-M LCx in 06/2023, HFpEF, chronic RBBB, NSVT, PSVT, severe aortic stenosis s/p TAVR in 07/2023, carotid artery stenosis, PAD, hypertension, hyperlipidemia, type 2 diabetes, CKD stage IIIb, bladder cancer s/p TURP in 2017, gout, and osteoarthritis who presents for follow-up related to CAD, heart failure, and aortic stenosis.  Past Medical History    Past Medical History:  Diagnosis Date   Benign localized prostatic hyperplasia with lower urinary tract symptoms (LUTS)    Bladder cancer [facility] dx 05/ 2017--  urologist- dr chauncey   TCC , Ta of bladder  s/p TURBT 06-02-2015   CAD (coronary artery disease)    no cardiologist--  followed by pcp,  dr plontnikov   Chronic low back pain with sciatica    neuropathy feet   CKD stage 3b, GFR 30-44 ml/min (HCC)    Diabetes mellitus, type 2 (HCC)    ED (erectile dysfunction)    Full dentures    Heart failure with mildly reduced ejection fraction (HFmrEF) (HCC)    History of kidney stones    Hyperlipidemia    Hypertension    Hypogonadism male    OA (osteoarthritis)    PAD (peripheral artery disease)    Right bundle branch block (RBBB)    S/P CABG x 1    1998   S/P TAVR (transcatheter aortic valve replacement) 08/09/2023   s/p TAVR with a 23 mm Edwards Sapien 3 Ultra Resilia THV via the TF approach by Dr. Wonda and Dr. Lucas   Severe aortic stenosis    Sigmoid diverticulosis    Ventral hernia    midline   Wears glasses    Past Surgical History:  Procedure Laterality Date   COLONOSCOPY WITH PROPOFOL   02-01-2012   CORONARY ARTERY BYPASS GRAFT  1998  at [facility]   x1  vessel    CORONARY STENT INTERVENTION N/A 06/17/2023   Procedure: CORONARY STENT INTERVENTION;   Surgeon: Cash Lonni BIRCH, MD;  Location: [facility] INVASIVE CV LAB;  Service: Cardiovascular;  Laterality: N/A;   CORONARY/GRAFT ANGIOGRAPHY N/A 06/17/2023   Procedure: CORONARY/GRAFT ANGIOGRAPHY;  Surgeon: Cash Lonni BIRCH, MD;  Location: [facility] INVASIVE CV LAB;  Service: Cardiovascular;  Laterality: N/A;   CYSTOSCOPY WITH BIOPSY N/A 10/08/2015   Procedure: CYSTOSCOPY WITH BLADDER BIOPSY;  Surgeon: Redell Lynwood Chauncey, MD;  Location: [facility];  Service: Urology;  Laterality: N/A;   CYSTOSCOPY WITH FULGERATION N/A 10/08/2015   Procedure: CYSTOSCOPY WITH FULGERATION;  Surgeon: Redell Lynwood Chauncey, MD;  Location: [facility];  Service: Urology;  Laterality: N/A;   CYSTOSCOPY WITH FULGERATION N/A 01/14/2022   Procedure: CYSTOSCOPY WITH FULGERATION;  Surgeon: Carolee Sherwood BIRCH DOUGLAS, MD;  Location: [facility];  Service: Urology;  Laterality: N/A;   INTRAOPERATIVE TRANSTHORACIC ECHOCARDIOGRAM N/A 08/09/2023   Procedure: ECHOCARDIOGRAM, TRANSTHORACIC;  Surgeon: Wonda Sharper, MD;  Location: [facility] INVASIVE CV LAB;  Service: Cardiovascular;  Laterality: N/A;   TONSILLECTOMY  age 81   TRANSURETHRAL RESECTION OF BLADDER TUMOR Bilateral 05/28/2022   Procedure: TRANSURETHRAL RESECTION OF BLADDER TUMOR (TURBT)  BILATERAL RETROGRADE PYELOGRAM;  Surgeon: Carolee Sherwood BIRCH DOUGLAS, MD;  Location: [facility];  Service: Urology;  Laterality: Bilateral;  1 HR FOR CASE   TRANSURETHRAL RESECTION OF  BLADDER TUMOR WITH GYRUS (TURBT-GYRUS) N/A 06/02/2015   Procedure: TRANSURETHRAL RESECTION OF BLADDER TUMOR WITH GYRUS (TURBT-GYRUS);  Surgeon: Redell Lynwood Napoleon, MD;  Location: [facility];  Service: Urology;  Laterality: N/A;    Allergies  Allergies[1]   Labs/Other Studies Reviewed    The following studies were reviewed today:  Cardiac Studies & Procedures   ______________________________________________________________________________________________ CARDIAC CATHETERIZATION  CARDIAC  CATHETERIZATION 06/17/2023  Conclusion   Prox RCA lesion is 50% stenosed.   RPDA lesion is 70% stenosed.   Ost LAD to Prox LAD lesion is 100% stenosed.   Mid Cx lesion is 99% stenosed.   Prox Cx lesion is 50% stenosed.   A drug-eluting stent was successfully placed using a STENT SYNERGY XD 2.75X16.   Post intervention, there is a 0% residual stenosis.   LIMA graft was visualized by angiography and is normal in caliber.  Chronic occlusion of the ostial. Patent LIMA to LAD which fills the entire LAD Large caliber Circumflex with moderate proximal stenosis and severe mid stenosis Successful PTCA/DES x 1 mid Circumflex Large dominant RCA with moderate mid vessel stenosis. Diffuse severe disease in the small caliber PDA  Recommendations: Continue DAPT with ASA and Plavix  for at least six months. Continue workup for TAVR  Findings Coronary Findings Diagnostic  Dominance: Right  Left Anterior Descending Vessel is large. Ost LAD to Prox LAD lesion is 100% stenosed. The lesion is chronically occluded.  Left Circumflex Vessel is large. Prox Cx lesion is 50% stenosed. Mid Cx lesion is 99% stenosed.  Second Obtuse Marginal Branch Vessel is small in size.  Third Obtuse Marginal Branch Vessel is small in size.  Right Coronary Artery Vessel is large. Prox RCA lesion is 50% stenosed.  Right Posterior Descending Artery Vessel is small in size. RPDA lesion is 70% stenosed.  LIMA LIMA Graft To Dist LAD LIMA graft was visualized by angiography and is normal in caliber.  Intervention  Mid Cx lesion Stent CATH VISTA GUIDE 6FR XBLD 3.5 guide catheter was inserted. Lesion crossed with guidewire using a WIRE ASAHI PROWATER 180CM. Pre-stent angioplasty was performed using a BALLOON EMERGE MR 2.0X12. A drug-eluting stent was successfully placed using a STENT SYNERGY XD 2.75X16. Stent strut is well apposed. Post-stent angioplasty was not performed. Post-Intervention Lesion Assessment The  intervention was successful. Pre-interventional TIMI flow is 3. Post-intervention TIMI flow is 3. No complications occurred at this lesion. There is a 0% residual stenosis post intervention.   STRESS TESTS  MYOCARDIAL PERFUSION IMAGING 03/23/2023  Interpretation Summary   LV perfusion is normal. There is no evidence of ischemia. There is no evidence of infarction. Mildly reduced inferior counts consistent with diaphragm attenuation.   Left ventricular function is normal. Nuclear stress EF: 47%. The left ventricular ejection fraction is mildly decreased (45-54%). End diastolic cavity size is normal.   The study is normal. The study is low risk.   ECHOCARDIOGRAM  ECHOCARDIOGRAM COMPLETE 09/15/2023  Narrative ECHOCARDIOGRAM REPORT    Patient Name:   Jeff Burgess Date of Exam: 09/15/2023 Medical Rec #:  [id number]        Height:       69.0 in Accession #:    [id number]       Weight:       198.8 lb Date of Birth:  December 12, 1935         BSA:          2.061 m Patient Age:    9 years  BP:           100/78 mmHg Patient Gender: M                HR:           91 bpm. Exam Location:  [address]  Procedure: 2D Echo, Cardiac Doppler and Color Doppler (Both Spectral and Color Flow Doppler were utilized during procedure).  Indications:    Z95.2 S/p TAVR  History:        Patient has prior history of Echocardiogram examinations, most recent 08/10/2023. CAD, S/p TAVR, Arrythmias:RBBB; Risk Factors:Hypertension and Dyslipidemia. Aortic Valve: 23 mm Sapien prosthetic, stented (TAVR) valve is present in the aortic position.  Sonographer:    Elsie Bohr RDCS Referring Phys: [id number] [facility] R TH[place]   Sonographer Comments: Technically difficult study due to poor echo windows. IMPRESSIONS   1. Left ventricular ejection fraction, by estimation, is 55 to 60%. The left ventricle has normal function. The left ventricle has no regional wall motion abnormalities. There is mild concentric  left ventricular hypertrophy. Left ventricular diastolic parameters are consistent with Grade I diastolic dysfunction (impaired relaxation). 2. Right ventricular systolic function is normal. The right ventricular size is normal. 3. The mitral valve is normal in structure. No evidence of mitral valve regurgitation. No evidence of mitral stenosis. 4. The aortic valve has been repaired/replaced. Aortic valve regurgitation is not visualized. No aortic stenosis is present. There is a 23 mm Sapien prosthetic (TAVR) valve present in the aortic position. Aortic valve mean gradient measures 12.0 mmHg. Aortic valve Vmax measures 2.24 m/s. 5. The inferior vena cava is normal in size with greater than 50% respiratory variability, suggesting right atrial pressure of 3 mmHg.  FINDINGS Left Ventricle: Left ventricular ejection fraction, by estimation, is 55 to 60%. The left ventricle has normal function. The left ventricle has no regional wall motion abnormalities. The left ventricular internal cavity size was normal in size. There is mild concentric left ventricular hypertrophy. Left ventricular diastolic parameters are consistent with Grade I diastolic dysfunction (impaired relaxation).  Right Ventricle: The right ventricular size is normal. No increase in right ventricular wall thickness. Right ventricular systolic function is normal.  Left Atrium: Left atrial size was normal in size.  Right Atrium: Right atrial size was normal in size.  Pericardium: There is no evidence of pericardial effusion.  Mitral Valve: The mitral valve is normal in structure. No evidence of mitral valve regurgitation. No evidence of mitral valve stenosis.  Tricuspid Valve: The tricuspid valve is normal in structure. Tricuspid valve regurgitation is not demonstrated. No evidence of tricuspid stenosis.  Aortic Valve: The aortic valve has been repaired/replaced. Aortic valve regurgitation is not visualized. No aortic stenosis is  present. Aortic valve mean gradient measures 12.0 mmHg. Aortic valve peak gradient measures 20.1 mmHg. Aortic valve area, by VTI measures 0.93 cm. There is a 23 mm Sapien prosthetic, stented (TAVR) valve present in the aortic position.  Pulmonic Valve: The pulmonic valve was normal in structure. Pulmonic valve regurgitation is not visualized. No evidence of pulmonic stenosis.  Aorta: The aortic root is normal in size and structure.  Venous: The inferior vena cava is normal in size with greater than 50% respiratory variability, suggesting right atrial pressure of 3 mmHg.  IAS/Shunts: No atrial level shunt detected by color flow Doppler.   LEFT VENTRICLE PLAX 2D LVIDd:         5.10 cm   Diastology LVIDs:         3.50  cm   LV e' medial:    5.55 cm/s LV PW:         1.20 cm   LV E/e' medial:  11.2 LV IVS:        1.30 cm   LV e' lateral:   5.87 cm/s LVOT diam:     2.00 cm   LV E/e' lateral: 10.6 LV SV:         35 LV SV Index:   17 LVOT Area:     3.14 cm   RIGHT VENTRICLE            IVC RV S prime:     7.40 cm/s  IVC diam: 4.20 cm TAPSE (M-mode): 1.6 cm RVSP:           23.8 mmHg  LEFT ATRIUM           Index        RIGHT ATRIUM           Index LA diam:      3.70 cm 1.80 cm/m   RA Pressure: 3.00 mmHg LA Vol (A2C): 28.1 ml 13.64 ml/m  RA Area:     15.00 cm LA Vol (A4C): 43.7 ml 21.21 ml/m  RA Volume:   39.40 ml  19.12 ml/m AORTIC VALVE AV Area (Vmax):    0.83 cm AV Area (Vmean):   0.92 cm AV Area (VTI):     0.93 cm AV Vmax:           224.00 cm/s AV Vmean:          145.000 cm/s AV VTI:            0.382 m AV Peak Grad:      20.1 mmHg AV Mean Grad:      12.0 mmHg LVOT Vmax:         59.40 cm/s LVOT Vmean:        42.400 cm/s LVOT VTI:          0.113 m LVOT/AV VTI ratio: 0.30  AORTA Ao Root diam: 2.80 cm Ao Asc diam:  3.40 cm  MITRAL VALVE                TRICUSPID VALVE MV Area (PHT): 2.53 cm     TR Peak grad:   20.8 mmHg MV Decel Time: 300 msec     TR Vmax:         228.00 cm/s MV E velocity: 62.00 cm/s   Estimated RAP:  3.00 mmHg MV A velocity: 107.00 cm/s  RVSP:           23.8 mmHg MV E/A ratio:  0.58 SHUNTS Systemic VTI:  0.11 m Systemic Diam: 2.00 cm  Aditya Sabharwal Electronically signed by Ria Commander Signature Date/Time: 09/15/2023/9:15:54 PM    Final    MONITORS  LONG TERM MONITOR-LIVE TELEMETRY (3-14 DAYS) 09/06/2023  Narrative Patch Wear Time:  14 days and 0 hours (2025-07-30T15:01:10-398 to 2025-08-13T15:01:10-398)  Sinus rhythm. (min HR of 46 bpm, max HR of 218 bpm, and avg HR of 80 bpm). Multiple runs of Ventricular Tachycardia runs occurred, the longest run was 15 beats. Multiple runs of supraventricular Tachycardia occurred, the longest lasting for 23 seconds. Rare supraventricular ectopic beats (3.0%, 49063), SVE Couplets were rare (<1.0%, 3153), and SVE Triplets were rare (<1.0%, 444). Frequent ventricular ectopic beats (5.9%, 94967), VE Couplets were occasional (2.0%, 16432), and VE Triplets were rare (<1.0%, 4726). Ventricular Bigeminy and Trigeminy were present.   CT SCANS  CT  CORONARY MORPH W/CTA COR W/SCORE 07/04/2023  Addendum 08/01/2023  5:53 PM ADDENDUM REPORT: 08/01/2023 17:51  CORRECTION TO IMPRESSIONS: CORRECTION TO IMPRESSIONS 3.  Annulus area: 406 mm2, suitable for 23 mm Sapien 3 valve.   Electronically Signed By: Soyla Merck M.D. On: 08/01/2023 17:51  Addendum 07/10/2023  4:34 PM ADDENDUM REPORT: 07/10/2023 16:32  EXAM: OVER-READ INTERPRETATION  CT CHEST  The following report is an over-read performed by radiologist Dr. Kate Freiberg [facility] on 07/10/2023. This over-read does not include interpretation of cardiac or coronary anatomy or pathology. The cardiac TAVR CT interpretation by the cardiologist is attached.  COMPARISON:  CT abdomen pelvis 01/07/2022, CT chest 05/19/2015, CT angiography chest 07/04/2023  FINDINGS: No filling defects of the Jewel eyes  pulmonary arteries.  Stable 9 x 5 mm right middle lobe pulmonary nodule (306:20). Stable subpleural right middle lobe 8 x 5 mm (306:22). Stable right lower lobe 4 mm subpleural nodule (306:39). Stable slightly more superior subpleural right lower lobe pulmonary nodule measuring 5 mm (306:23). No further evaluation of these chronic nodules is indicated. No new pulmonary nodule. Chronic calcified pleural plaques are again noted.  Slightly more prominent and interval increase in size of a right peribronchovascular 1.4 x 1 cm (from 1.1 by 1.1 cm) soft tissue density (304:94). Associated prominent but nonenlarged right hilar, left hilar, and mediastinal lymph nodes.  Persistent but nonenlarged right hilar, left hilar, and mediastinal lymph nodes.  No acute upper abdominal abnormality.  No acute osseous abnormality. Multilevel osteophyte formation. Intact sternotomy wires.  No acute soft tissue abnormality.  IMPRESSION: 1. Slightly more prominent and interval increase in size of a right peribronchovascular 1.4 x 1 cm (from 1.1 by 1.1 cm) soft tissue density. Recommend follow-up CT with intravenous contrast in 3 months to further evaluate stability. Additional imaging evaluation or consultation with Pulmonology or Thoracic Surgery recommended. 2. Persistent but nonenlarged right hilar, left hilar, and mediastinal lymph nodes. Recommend attention on follow-up.   Electronically Signed By: Morgane  Naveau M.D. On: 07/10/2023 16:32  Narrative CLINICAL DATA:  Aortic valve replacement (TAVR), pre-op eval  EXAM: Cardiac TAVR CT  TECHNIQUE: A non-contrast, gated CT scan was obtained with axial slices of 2.5 mm through the heart for aortic valve scoring. A 120 kV retrospective, gated, contrast cardiac scan was obtained. Gantry rotation speed was 230 msec and collimation was 0.63 mm. Nitroglycerin  was not given. A delayed scan was obtained to exclude left atrial appendage thrombus.  The 3D dataset was reconstructed in systole with motion correction. The 3D data set was reconstructed in 5% intervals of the 0-95% of the R-R cycle. Systolic and diastolic phases were analyzed on a dedicated workstation using MPR, MIP, and VRT modes. The patient received 100 cc of contrast.  FINDINGS: Aortic Valve:  Tricuspid aortic valve with severely reduced cusp excursion. Severely thickened and severely calcified aortic valve cusps.  AV calcium  score: 2249  Virtual Basal Annulus Measurements:  Maximum/Minimum Diameter: 23.9 x 22.1 mm  Perimeter: 72.2 mm  Area:  406 mm2  No significant LVOT calcifications.  Membranous septal length: 5.5 mm  Based on these measurements, the annulus would be suitable for a 26 mm Sapien 3 valve. Alternatively, Heart Team can consider 26 or 29 mm Evolut valve (perimeter on borderline for valve type). Recommend Heart Team discussion for valve selection.  Sinus of Valsalva Measurements:  Non-coronary:  31 mm  Right - coronary:  31 mm  Left - coronary:  33 mm  Sinus of Valsalva  Height:  Left: 23 mm  Right: 21 mm  Aorta: Conventional 3 vessel branch pattern of aortic arch.  Sinotubular Junction:  27 mm  Ascending Thoracic Aorta:  34 mm  Aortic Arch:  27 mm  Descending Thoracic Aorta:  24 mm  Coronary Artery Height above Annulus:  Left main: 13 mm  Right coronary: 18 mm  Coronary Arteries: Normal coronary origin. Right dominance. The study was performed without use of NTG and insufficient for plaque evaluation. Prior CABG.  Optimum Fluoroscopic Angle for Delivery: RAO 5 CRA 4  OTHER:  Atria: Grossly normal.  Lipomatous hypertrophy of atrial septum.  Left atrial appendage: No thrombus.  Mitral valve: Grossly normal, no mitral annular calcifications.  Pulmonary artery: Normal caliber.  Pulmonary veins: Normal variant anatomy (3 left 2 right).  IMPRESSION: 1. Tricuspid aortic valve with severely reduced cusp  excursion. Severely thickened and severely calcified aortic valve cusps. 2. Aortic valve calcium  score: 2249 3. Annulus area: 406 mm2, suitable for 26 mm Sapien 3 valve. No LVOT calcifications. Membranous septal length 5.5 mm. 4. Sufficient coronary artery heights from annulus. 5. Optimum fluoroscopic angle for delivery: RAO 5 CRA 4  Electronically Signed: By: Soyla Merck M.D. On: 07/04/2023 14:00     ______________________________________________________________________________________________     Recent Labs: 08/10/2023: Hemoglobin 12.1; Magnesium  1.6; Platelets 209 09/14/2023: ALT 23; BUN 32; Creatinine, Ser 1.66; Potassium 4.5; Sodium 140  Recent Lipid Panel    Component Value Date/Time   CHOL 110 09/14/2023 1155   TRIG 173.0 (H) 09/14/2023 1155   HDL 28.70 (L) 09/14/2023 1155   CHOLHDL 4 09/14/2023 1155   VLDL 34.6 09/14/2023 1155   LDLCALC 47 09/14/2023 1155   LDLDIRECT 78.4 11/02/2011 0936    History of Present Illness    88 year old male with the above past medical history including CAD s/p CABG x 1 in 1998, DES-M LCx in 06/2023, HFpEF, chronic RBBB, NSVT, PSVT, severe aortic stenosis s/p TAVR in 07/2023, carotid artery stenosis, PAD, hypertension, hyperlipidemia, type 2 diabetes, CKD stage IIIb, bladder cancer s/p TURP in 2017, gout, and osteoarthritis.  Most recent echocardiogram in 02/2023 showed EF 50 to 55%, low-flow low gradient severe aortic stenosis, mean gradient 25 mmHg.  Cardiac catheterization in 06/2023 showed chronic occlusion of the ostial LAD with patent LIMA-LAD filling the entire vessel.  There was a large left circumflex with severe mid vessel stenosis s/p DES x 1.  RCA had 50% proximal stenosis, 70% PDA stenosis.  He underwent successful TAVR with a 23 mm Edwards SAPIEN 3 ultra Resilia THV via TF approach on 08/09/2023.  Postoperative echo showed EF 60%, normally functioning TAVR, mean gradient 13 mmHg, no PVL.  ZIO monitor in 08/2023 showed predominantly  normal sinus rhythm, multiple runs of VT, longest lasting 15 beats, multiple runs of SVT and longest lasting 23 seconds, frequent ventricular ectopy. He was started on low-dose metoprolol .  Repeat echocardiogram in 09/2023 showed EF 55 to 60%, normal LV function, no RWMA, G1 DD, normal RV systolic function, stable TAVR, mean gradient 12 mmHg.  He was last seen in the office on 09/15/2023 and was stable from a cardiac standpoint.  He presents today for follow-up accompanied by his daughter.  Since his last visit he has done well from a cardiac standpoint.  He graduated from cardiac rehab and hopes to continue exercising.  He has some orthopedic pain (right hip and right shoulder pain), he denies any chest pain, palpitations, dyspnea, edema, PND orthopnea waking.  He does note occasional  lightheadedness, denies any presyncope or syncope.  Overall, he reports feeling well.     Home Medications    Current Outpatient Medications  Medication Sig Dispense Refill   furosemide  (LASIX ) 40 MG tablet Take 1 tablet (40 mg total) by mouth as needed. (Patient taking differently: Take 40 mg by mouth daily. Patient reported taking every day for 6 months)     allopurinol  (ZYLOPRIM ) 100 MG tablet Take 0.5 tablets (50 mg total) by mouth daily. 45 tablet 3   aspirin  EC 81 MG tablet Take 1 tablet (81 mg total) by mouth daily. Swallow whole.     b complex vitamins capsule Take 1 capsule by mouth daily.     Blood Glucose Monitoring Suppl DEVI 1 each by Does not apply route in the morning, at noon, and at bedtime. May substitute to any manufacturer covered by patient's insurance. 1 each 0   cholecalciferol  (VITAMIN D ) 25 MCG (1000 UT) tablet Take 1 tablet (1,000 Units total) by mouth every morning. Follow-up due in April must see provider for refills 90 tablet 0   CINNAMON PO Take 3 capsules by mouth daily. OTC cinnamon 1000mg  capsules.     clopidogrel  (PLAVIX ) 75 MG tablet Take 1 tablet (75 mg total) by mouth daily with  breakfast. 90 tablet 2   colchicine  0.6 MG tablet TAKE 2 TABS AS NEEDED FOR GOUT ATTACK.THEN TAKE ANOTHER ONE IN 1-2 HRS. DO NOT REPEAT FOR 3 DAYS. 108 tablet 1   gabapentin  (NEURONTIN ) 300 MG capsule Take 300-600 mg by mouth daily as needed (nerve pain).     HYDROcodone -acetaminophen  (NORCO/VICODIN) 5-325 MG tablet Take 1 tablet by mouth every 4 (four) hours as needed (pain). 20 tablet 0   isosorbide  mononitrate (IMDUR ) 30 MG 24 hr tablet TAKE 1 TABLET BY MOUTH DAILY 100 tablet 2   metFORMIN  (GLUCOPHAGE -XR) 500 MG 24 hr tablet TAKE 1 TABLET BY MOUTH TWICE  DAILY 200 tablet 2   methylPREDNISolone  (MEDROL  DOSEPAK) 4 MG TBPK tablet As directed for gout 21 tablet 0   metoprolol  succinate (TOPROL  XL) 25 MG 24 hr tablet Take 0.5 tablets (12.5 mg total) by mouth daily. 45 tablet 3   Multiple Vitamins-Minerals (MENS 50+ MULTIVITAMIN) TABS Take 1 tablet by mouth daily.     nitroGLYCERIN  (NITROSTAT ) 0.4 MG SL tablet Place 1 tablet (0.4 mg total) under the tongue every 5 (five) minutes as needed. 25 tablet 2   repaglinide  (PRANDIN ) 1 MG tablet TAKE 1 TABLET (1 MG TOTAL) BY MOUTH 3 (THREE) TIMES DAILY BEFORE MEALS. 300 tablet 3   telmisartan -hydrochlorothiazide  (MICARDIS  HCT) 80-25 MG tablet TAKE 1 TABLET BY MOUTH DAILY 90 tablet 3   TYLENOL  500 MG tablet Take 500-1,000 mg by mouth 2 (two) times daily as needed for moderate pain (pain score 4-6), fever or headache.     ZYPITAMAG  4 MG TABS TAKE 1 TABLET BY MOUTH EVERY DAY 90 tablet 3   No current facility-administered medications for this visit.     Review of Systems    He denies chest pain, palpitations, dyspnea, pnd, orthopnea, n, v, dizziness, syncope, edema, weight gain, or early satiety. All other systems reviewed and are otherwise negative except as noted above.   Physical Exam    VS:  BP (!) 117/58 (BP Location: Left Arm, Patient Position: Sitting, Cuff Size: Large)   Pulse 71   Resp 16   Ht 5' 9 (1.753 m)   Wt 194 lb 12.8 oz (88.4 kg)    SpO2 95%   BMI  28.77 kg/m   GEN: Well nourished, well developed, in no acute distress. HEENT: normal. Neck: Supple, no JVD, carotid bruits, or masses. Cardiac: RRR, 2/6 murmur, no rubs, or gallops. No clubbing, cyanosis, edema.  Radials/DP/PT 2+ and equal bilaterally.  Respiratory:  Respirations regular and unlabored, clear to auscultation bilaterally. GI: Soft, nontender, nondistended, BS + x 4. MS: no deformity or atrophy. Skin: warm and dry, no rash. Neuro:  Strength and sensation are intact. Psych: Normal affect.  Accessory Clinical Findings    ECG personally reviewed by me today -    - no EKG in office today.    Lab Results  Component Value Date   WBC 14.3 (H) 08/10/2023   HGB 12.1 (L) 08/10/2023   HCT 35.7 (L) 08/10/2023   MCV 88.6 08/10/2023   PLT 209 08/10/2023   Lab Results  Component Value Date   CREATININE 1.66 (H) 09/14/2023   BUN 32 (H) 09/14/2023   NA 140 09/14/2023   K 4.5 09/14/2023   CL 104 09/14/2023   CO2 28 09/14/2023   Lab Results  Component Value Date   ALT 23 09/14/2023   AST 22 09/14/2023   ALKPHOS 84 09/14/2023   BILITOT 0.3 09/14/2023   Lab Results  Component Value Date   CHOL 110 09/14/2023   HDL 28.70 (L) 09/14/2023   LDLCALC 47 09/14/2023   LDLDIRECT 78.4 11/02/2011   TRIG 173.0 (H) 09/14/2023   CHOLHDL 4 09/14/2023    Lab Results  Component Value Date   HGBA1C 7.5 (H) 09/14/2023    Assessment & Plan   1. CAD: S/p CABG x 1 in 1998, DES-M LCx in 06/2023. Stable with no anginal symptoms. No indication for ischemic evaluation. Continue aspirin , Plavix , metoprolol , telmisartan -HCTZ, Imdur , Lasix , and pitavastatin .  2. HFpEF: Most recent echo in 09/2023 showed EF 55 to 60%, normal LV function, no RWMA, G1 DD, normal RV systolic function, stable TAVR, mean gradient 12 mmHg.  Euvolemic and well compensated on exam. Continue Lasix .   3. Chronic RBBB: ZIO monitor in 08/2023 showed predominantly normal sinus rhythm, multiple runs of VT,  longest lasting 15 beats, multiple runs of SVT and longest lasting 23 seconds, frequent ventricular ectopy.  Denies any recent palpitations.  Continue metoprolol .  4. Severe aortic stenosis: S/p TAVR.  Postop echo showed stable TAVR.  Repeat echocardiogram scheduled for 07/2024.  Asymptomatic.  Encouraged increased activity as tolerated.  Repeat echocardiogram scheduled for 08/09/2024.  Continue SBE prophylaxis.  Continue aspirin , Plavix  given recent PCI.  5. Carotid artery stenosis/PAD: Carotid ultrasound in 2018 showed 1 to 39% B ICA stenosis.  Asymptomatic. Lower extremity Dopplers in 2018 showed total occlusion right posterior tibial artery and left to arterial tibial artery.  He was referred to vascular surgery who felt symptoms were not consistent with claudication and were likely related to pseudoclaudication from back issues. ABIs in 2024 showed no significant PAD.  Continue aspirin , Plavix , pravastatin .  6. Hypertension: BP well controlled. Continue current antihypertensive regimen.   7. Hyperlipidemia: LDL was 47 in 09/2023.  Continue pitavastatin .  8. Type 2 diabetes: A1c was 7.5 in 09/2023.  Monitored and managed per PCP.  9. CKD stage IIIb: Creatinine was stable at 1.66 in 09/2023.  10. Disposition: Follow-up as scheduled in 07/2024, sooner if needed.      Jeff JAYSON Braver, NP 12/30/2023, 1:39 PM       [1]  Allergies Allergen Reactions   Coreg  [Carvedilol ] Other (See Comments)    Leg weakness, myopathy  Lipitor [Atorvastatin ] Other (See Comments)    Leg weakness, myopathy   Mevacor  [Lovastatin ] Other (See Comments)    Leg weakness, myopathy   Morphine Sulfate Other (See Comments)    Diaphoresis Per the pt: After taking morphine, within 30 mins, I was soaking wet. I did not like the way it made me feel.   "

## 2023-12-31 ENCOUNTER — Other Ambulatory Visit: Payer: Self-pay | Admitting: Internal Medicine

## 2024-01-17 DIAGNOSIS — M75101 Unspecified rotator cuff tear or rupture of right shoulder, not specified as traumatic: Secondary | ICD-10-CM

## 2024-02-15 ENCOUNTER — Ambulatory Visit: Admitting: Internal Medicine

## 2024-02-15 ENCOUNTER — Encounter: Payer: Self-pay | Admitting: Internal Medicine

## 2024-02-15 VITALS — BP 156/82 | HR 81 | Ht 69.0 in | Wt 198.6 lb

## 2024-02-15 DIAGNOSIS — Z952 Presence of prosthetic heart valve: Secondary | ICD-10-CM

## 2024-02-15 DIAGNOSIS — R269 Unspecified abnormalities of gait and mobility: Secondary | ICD-10-CM

## 2024-02-15 DIAGNOSIS — E1169 Type 2 diabetes mellitus with other specified complication: Secondary | ICD-10-CM

## 2024-02-15 DIAGNOSIS — E1122 Type 2 diabetes mellitus with diabetic chronic kidney disease: Secondary | ICD-10-CM

## 2024-02-15 DIAGNOSIS — G8929 Other chronic pain: Secondary | ICD-10-CM

## 2024-02-15 DIAGNOSIS — I10 Essential (primary) hypertension: Secondary | ICD-10-CM

## 2024-02-15 DIAGNOSIS — M25511 Pain in right shoulder: Secondary | ICD-10-CM | POA: Insufficient documentation

## 2024-02-15 DIAGNOSIS — N1832 Chronic kidney disease, stage 3b: Secondary | ICD-10-CM

## 2024-02-15 DIAGNOSIS — I5020 Unspecified systolic (congestive) heart failure: Secondary | ICD-10-CM

## 2024-02-15 DIAGNOSIS — M48061 Spinal stenosis, lumbar region without neurogenic claudication: Secondary | ICD-10-CM

## 2024-02-15 DIAGNOSIS — C678 Malignant neoplasm of overlapping sites of bladder: Secondary | ICD-10-CM

## 2024-02-15 LAB — COMPREHENSIVE METABOLIC PANEL WITH GFR
ALT: 17 U/L (ref 3–53)
AST: 19 U/L (ref 5–37)
Albumin: 4.2 g/dL (ref 3.5–5.2)
Alkaline Phosphatase: 81 U/L (ref 39–117)
BUN: 26 mg/dL — ABNORMAL HIGH (ref 6–23)
CO2: 26 meq/L (ref 19–32)
Calcium: 9.7 mg/dL (ref 8.4–10.5)
Chloride: 104 meq/L (ref 96–112)
Creatinine, Ser: 1.63 mg/dL — ABNORMAL HIGH (ref 0.40–1.50)
GFR: 37.81 mL/min — ABNORMAL LOW
Glucose, Bld: 88 mg/dL (ref 70–99)
Potassium: 4.3 meq/L (ref 3.5–5.1)
Sodium: 140 meq/L (ref 135–145)
Total Bilirubin: 0.5 mg/dL (ref 0.2–1.2)
Total Protein: 6.7 g/dL (ref 6.0–8.3)

## 2024-02-15 LAB — LIPID PANEL
Cholesterol: 106 mg/dL (ref 28–200)
HDL: 26.2 mg/dL — ABNORMAL LOW
LDL Cholesterol: 39 mg/dL (ref 10–99)
NonHDL: 80.04
Total CHOL/HDL Ratio: 4
Triglycerides: 206 mg/dL — ABNORMAL HIGH (ref 10.0–149.0)
VLDL: 41.2 mg/dL — ABNORMAL HIGH (ref 0.0–40.0)

## 2024-02-15 LAB — TSH: TSH: 2.65 u[IU]/mL (ref 0.35–5.50)

## 2024-02-15 LAB — HEMOGLOBIN A1C: Hgb A1c MFr Bld: 7.2 % — ABNORMAL HIGH (ref 4.6–6.5)

## 2024-02-15 NOTE — Assessment & Plan Note (Signed)
 PT offered

## 2024-02-15 NOTE — Assessment & Plan Note (Signed)
 On Toprol , Furosemide , Micardis  HCT

## 2024-02-15 NOTE — Assessment & Plan Note (Signed)
 Can take oxycodone , hydrocodone  OK Spinal inj - pending

## 2024-02-15 NOTE — Patient Instructions (Addendum)
 Buy [employer] greek yogurt. Eat with honey  Recent large-scale observational studies provide strong evidence that the shingles vaccine is associated with a reduced risk of developing dementia. It may also help slow cognitive decline in individuals who already have dementia.  Key Findings from Recent Research Multiple natural experiment studies, which leveraged unique vaccination policies to minimize bias, have consistently found a protective link:  Reduced Risk: One large study, published in [place], analyzed health records from over 280,000 older adults in [place] and found that those who received the live-attenuated shingles vaccine (Zostavax, now discontinued in the [place] ) were approximately 20% less likely to develop dementia over a seven-year follow-up period. Slower Progression: A follow-up study published in Cell indicated that for those already living with dementia, the vaccine appeared to slow the progression of the disease and reduced dementia-related deaths by nearly half over nine years. Vascular Dementia Protection: Other research presented at [facility] 2025 indicated that the vaccine lowered the risk of vascular dementia by 50%. Newer Vaccine: A separate study published in [employer] suggested that the newer, more effective recombinant vaccine (Shingrix, currently used in the [place] ) also offers significant protection against dementia, with an association of lower risk than the older vaccine type. Stronger Effect in Women: Several studies noted that the protective effect against dementia was more pronounced in women than in men, possibly due to differences in immune responses or dementia pathogenesis.  Why Might the Vaccine Help? Researchers are still working to determine the exact mechanism, but current theories center on the idea that preventing the shingles virus (varicella-zoster) from reactivating helps protect the brain:  Reduced Inflammation: The most likely explanation is that the vaccine  prevents shingles infections and subsequent inflammation in the nervous system, which is a known risk factor for neurodegenerative diseases like dementia. Immune System Modulation: It's also possible that the vaccine boosts the immune system more broadly, helping the body combat other processes related to cognitive decline.  Important Considerations Observational Data: While the evidence is strong, these findings primarily come from observational studies, which show an association, not a definitive cause-and-effect relationship. A large-scale randomized controlled trial is the gold standard for conclusive proof, but such trials are difficult to conduct for dementia due to the time and cost involved. Public Health Recommendation: The [place]. Centers for Disease Control and Prevention (CDC) already recommends the two-dose Shingrix vaccine for all healthy adults aged 29 and older primarily to prevent shingles and its painful complications. The potential added benefit for dementia prevention provides another compelling reason to get vaccinated.

## 2024-02-15 NOTE — Assessment & Plan Note (Signed)
Hydrate well ?Monitor GFR ?

## 2024-02-15 NOTE — Assessment & Plan Note (Signed)
Cont on Prandin, Metformin ?

## 2024-02-15 NOTE — Assessment & Plan Note (Addendum)
 Worse.  Probable rotator cuff tear.  Probable frozen shoulder ROM exercises.  Topical NSAIDs, heat Orthopedic surgery consultation was offered

## 2024-02-15 NOTE — Assessment & Plan Note (Signed)
Cont on Prandin ?

## 2024-02-15 NOTE — Assessment & Plan Note (Signed)
F/u w/Urology 

## 2024-02-15 NOTE — Progress Notes (Signed)
 "  Subjective:  Patient ID: Jeff Burgess, male    DOB: 1935-10-19  Age: 89 y.o. MRN: [id number]  CC: Medical Management of Chronic Issues (3 Month follow up)   HPI [name] presents for HTN, DM, neuropathy, spinal stenosis  Outpatient Medications Prior to Visit  Medication Sig Dispense Refill   allopurinol  (ZYLOPRIM ) 100 MG tablet Take 0.5 tablets (50 mg total) by mouth daily. 45 tablet 3   aspirin  EC 81 MG tablet Take 1 tablet (81 mg total) by mouth daily. Swallow whole.     b complex vitamins capsule Take 1 capsule by mouth daily.     Blood Glucose Monitoring Suppl DEVI 1 each by Does not apply route in the morning, at noon, and at bedtime. May substitute to any manufacturer covered by patient's insurance. 1 each 0   cholecalciferol  (VITAMIN D ) 25 MCG (1000 UT) tablet Take 1 tablet (1,000 Units total) by mouth every morning. Follow-up due in April must see provider for refills 90 tablet 0   CINNAMON PO Take 3 capsules by mouth daily. OTC cinnamon 1000mg  capsules.     clopidogrel  (PLAVIX ) 75 MG tablet Take 1 tablet (75 mg total) by mouth daily with breakfast. 90 tablet 2   colchicine  0.6 MG tablet TAKE 2 TABS AS NEEDED FOR GOUT ATTACK.THEN TAKE ANOTHER ONE IN 1-2 HRS. DO NOT REPEAT FOR 3 DAYS. 108 tablet 1   furosemide  (LASIX ) 40 MG tablet Take 1 tablet (40 mg total) by mouth as needed. (Patient taking differently: Take 40 mg by mouth daily. Patient reported taking every day for 6 months)     gabapentin  (NEURONTIN ) 300 MG capsule Take 300-600 mg by mouth daily as needed (nerve pain).     HYDROcodone -acetaminophen  (NORCO/VICODIN) 5-325 MG tablet Take 1 tablet by mouth every 4 (four) hours as needed (pain). 20 tablet 0   isosorbide  mononitrate (IMDUR ) 30 MG 24 hr tablet TAKE 1 TABLET BY MOUTH DAILY 100 tablet 2   metFORMIN  (GLUCOPHAGE -XR) 500 MG 24 hr tablet TAKE 1 TABLET BY MOUTH TWICE  DAILY 200 tablet 2   methylPREDNISolone  (MEDROL  DOSEPAK) 4 MG TBPK tablet As directed for gout 21  tablet 0   metoprolol  succinate (TOPROL  XL) 25 MG 24 hr tablet Take 0.5 tablets (12.5 mg total) by mouth daily. 45 tablet 3   Multiple Vitamins-Minerals (MENS 50+ MULTIVITAMIN) TABS Take 1 tablet by mouth daily.     nitroGLYCERIN  (NITROSTAT ) 0.4 MG SL tablet Place 1 tablet (0.4 mg total) under the tongue every 5 (five) minutes as needed. 25 tablet 2   repaglinide  (PRANDIN ) 1 MG tablet TAKE 1 TABLET (1 MG TOTAL) BY MOUTH 3 (THREE) TIMES DAILY BEFORE MEALS. 300 tablet 3   telmisartan -hydrochlorothiazide  (MICARDIS  HCT) 80-25 MG tablet TAKE 1 TABLET BY MOUTH DAILY 90 tablet 3   TYLENOL  500 MG tablet Take 500-1,000 mg by mouth 2 (two) times daily as needed for moderate pain (pain score 4-6), fever or headache.     ZYPITAMAG  4 MG TABS TAKE 1 TABLET BY MOUTH EVERY DAY 90 tablet 3   No facility-administered medications prior to visit.    ROS: Review of Systems  Constitutional:  Negative for appetite change, fatigue and unexpected weight change.  HENT:  Negative for congestion, nosebleeds, sneezing, sore throat and trouble swallowing.   Eyes:  Negative for itching and visual disturbance.  Respiratory:  Negative for cough.   Cardiovascular:  Negative for chest pain, palpitations and leg swelling.  Gastrointestinal:  Negative for abdominal distention, blood  in stool, diarrhea and nausea.  Genitourinary:  Negative for frequency and hematuria.  Musculoskeletal:  Positive for arthralgias, back pain and gait problem. Negative for joint swelling, neck pain and neck stiffness.  Skin:  Negative for rash.  Neurological:  Negative for dizziness, tremors, speech difficulty and weakness.  Psychiatric/Behavioral:  Negative for agitation, dysphoric mood and sleep disturbance. The patient is not nervous/anxious.     Objective:  BP (!) 156/82   Pulse 81   Ht 5' 9 (1.753 m)   Wt 198 lb 9.6 oz (90.1 kg)   SpO2 96%   BMI 29.33 kg/m   BP Readings from Last 3 Encounters:  02/15/24 (!) 156/82  12/30/23 (!)  117/58  11/15/23 128/62    Wt Readings from Last 3 Encounters:  02/15/24 198 lb 9.6 oz (90.1 kg)  12/30/23 194 lb 12.8 oz (88.4 kg)  11/15/23 205 lb (93 kg)    Physical Exam Constitutional:      General: He is not in acute distress.    Appearance: He is well-developed. He is obese.     Comments: NAD  Eyes:     Conjunctiva/sclera: Conjunctivae normal.     Pupils: Pupils are equal, round, and reactive to light.  Neck:     Thyroid : No thyromegaly.     Vascular: No JVD.  Cardiovascular:     Rate and Rhythm: Normal rate and regular rhythm.     Heart sounds: Normal heart sounds. No murmur heard.    No friction rub. No gallop.  Pulmonary:     Effort: Pulmonary effort is normal. No respiratory distress.     Breath sounds: Normal breath sounds. No wheezing or rales.  Chest:     Chest wall: No tenderness.  Abdominal:     General: Bowel sounds are normal. There is no distension.     Palpations: Abdomen is soft. There is no mass.     Tenderness: There is no abdominal tenderness. There is no guarding or rebound.  Musculoskeletal:        General: Tenderness present. Normal range of motion.     Cervical back: Normal range of motion.     Right lower leg: No edema.     Left lower leg: No edema.  Lymphadenopathy:     Cervical: No cervical adenopathy.  Skin:    General: Skin is warm and dry.     Findings: No rash.  Neurological:     Mental Status: He is alert and oriented to person, place, and time.     Cranial Nerves: No cranial nerve deficit.     Motor: No abnormal muscle tone.     Coordination: Coordination abnormal.     Gait: Gait abnormal.     Deep Tendon Reflexes: Reflexes are normal and symmetric. Reflexes normal.  Psychiatric:        Behavior: Behavior normal.        Thought Content: Thought content normal.        Judgment: Judgment normal.   R shoulder - restricted ROM, pain Lumbar spine is stiff, using a walker  Lab Results  Component Value Date   WBC 14.3 (H)  08/10/2023   HGB 12.1 (L) 08/10/2023   HCT 35.7 (L) 08/10/2023   PLT 209 08/10/2023   GLUCOSE 117 (H) 09/14/2023   CHOL 110 09/14/2023   TRIG 173.0 (H) 09/14/2023   HDL 28.70 (L) 09/14/2023   LDLDIRECT 78.4 11/02/2011   LDLCALC 47 09/14/2023   ALT 23 09/14/2023   AST 22  09/14/2023   NA 140 09/14/2023   K 4.5 09/14/2023   CL 104 09/14/2023   CREATININE 1.66 (H) 09/14/2023   BUN 32 (H) 09/14/2023   CO2 28 09/14/2023   TSH 2.79 12/20/2022   PSA 2.67 01/22/2021   INR 1.0 08/05/2023   HGBA1C 7.5 (H) 09/14/2023    CT Chest W Contrast Result Date: 10/13/2023 EXAM: CT CHEST WITH CONTRAST 10/12/2023 02:41:22 PM TECHNIQUE: CT of the chest was performed with the administration of intravenous contrast. Multiplanar reformatted images are provided for review. Automated exposure control, iterative reconstruction, and/or weight based adjustment of the mA/kV was utilized to reduce the radiation dose to as low as reasonably achievable. COMPARISON: CTA chest 07/04/2023 and CT chest 05/19/2015. CLINICAL HISTORY: Lung nodule, < 6mm, high cancer risk; lung mass. Lung nodule; 75ml omnipaque  350. FINDINGS: MEDIASTINUM: Heart and pericardium are unremarkable. The central airways are clear. LYMPH NODES: No mediastinal, hilar or axillary lymphadenopathy. LUNGS AND PLEURA: There are bilateral calcified and soft tissue pleural plaques. There are small nodules associated with pleural plaques. Findings were not changed from recent comparison exam or remote comparison exam from 2017. No new pleural plaques or nodularity. No focal consolidation or pulmonary edema. No pleural effusion or pneumothorax. SOFT TISSUES/BONES: Midline sternotomy noted. No acute abnormality of the bones or soft tissues. UPPER ABDOMEN: Limited images of the upper abdomen demonstrates no acute abnormality. IMPRESSION: 1. Stable bilateral calcified and soft tissue pleural plaques with associated small nodules, unchanged. No new nodularity.  Electronically signed by: Norleen Boxer MD 10/13/2023 12:24 PM EDT RP Workstation: [id number]    Assessment & Plan:   Problem List Items Addressed This Visit     Essential hypertension   Cont on Telmisartan -HCT, Amlodipine   5 mg/d      LOW BACK PAIN   Can take oxycodone , hydrocodone  OK Spinal inj - pending      RESOLVED: DM (diabetes mellitus), type 2 with renal complications (HCC) - Primary   Cont on Prandin , Metformin       Bladder cancer (HCC)   F/u w/Urology      Gait disorder   Using a walker Chronic sx's      Lumbar foraminal stenosis    PT offered      DM (diabetes mellitus), type 2 with renal complications (HCC)   Cont on Prandin       Heart failure with mildly reduced ejection fraction (HFmrEF) (HCC)   On Toprol , Furosemide , Micardis  HCT      CKD stage 3b, GFR 30-44 ml/min (HCC)   Hydrate well Monitor GFR      S/P TAVR (transcatheter aortic valve replacement)   Doing well      Shoulder pain, right   Worse.  Probable rotator cuff tear.  Probable frozen shoulder ROM exercises.  Topical NSAIDs, heat Orthopedic surgery consultation was offered      Other Visit Diagnoses       Type 2 diabetes mellitus with other specified complication, without long-term current use of insulin  (HCC)             No orders of the defined types were placed in this encounter.     Follow-up: Return in about 3 months (around 05/14/2024) for a follow-up visit.  Marolyn Noel, MD "

## 2024-02-15 NOTE — Assessment & Plan Note (Signed)
Cont on Telmisartan-HCT, Amlodipine  5 mg/d

## 2024-02-15 NOTE — Assessment & Plan Note (Signed)
 Using a walker Chronic sx's

## 2024-02-15 NOTE — Assessment & Plan Note (Signed)
 Doing well

## 2024-02-16 ENCOUNTER — Other Ambulatory Visit: Payer: Self-pay | Admitting: Cardiology

## 2024-05-14 ENCOUNTER — Ambulatory Visit: Admitting: Internal Medicine

## 2024-06-14 ENCOUNTER — Encounter: Admitting: Internal Medicine

## 2024-06-14 ENCOUNTER — Ambulatory Visit

## 2024-08-09 ENCOUNTER — Other Ambulatory Visit (HOSPITAL_COMMUNITY)

## 2024-08-09 ENCOUNTER — Ambulatory Visit: Admitting: Physician Assistant
